# Patient Record
Sex: Female | Born: 1945 | Race: White | Hispanic: No | Smoking: Former smoker
Health system: Southern US, Community
[De-identification: ages and names within clinical notes are randomized; demographics above are authoritative.]

## PROBLEM LIST (undated history)

## (undated) DIAGNOSIS — I2510 Atherosclerotic heart disease of native coronary artery without angina pectoris: Secondary | ICD-10-CM

## (undated) DIAGNOSIS — T8859XA Other complications of anesthesia, initial encounter: Secondary | ICD-10-CM

## (undated) DIAGNOSIS — R112 Nausea with vomiting, unspecified: Secondary | ICD-10-CM

## (undated) DIAGNOSIS — I519 Heart disease, unspecified: Secondary | ICD-10-CM

## (undated) DIAGNOSIS — I509 Heart failure, unspecified: Secondary | ICD-10-CM

## (undated) DIAGNOSIS — R011 Cardiac murmur, unspecified: Secondary | ICD-10-CM

## (undated) DIAGNOSIS — T4145XA Adverse effect of unspecified anesthetic, initial encounter: Secondary | ICD-10-CM

## (undated) DIAGNOSIS — C4492 Squamous cell carcinoma of skin, unspecified: Secondary | ICD-10-CM

## (undated) DIAGNOSIS — K5790 Diverticulosis of intestine, part unspecified, without perforation or abscess without bleeding: Secondary | ICD-10-CM

## (undated) DIAGNOSIS — B019 Varicella without complication: Secondary | ICD-10-CM

## (undated) DIAGNOSIS — M7580 Other shoulder lesions, unspecified shoulder: Secondary | ICD-10-CM

## (undated) DIAGNOSIS — E785 Hyperlipidemia, unspecified: Secondary | ICD-10-CM

## (undated) DIAGNOSIS — I10 Essential (primary) hypertension: Secondary | ICD-10-CM

## (undated) DIAGNOSIS — K635 Polyp of colon: Secondary | ICD-10-CM

## (undated) DIAGNOSIS — D649 Anemia, unspecified: Secondary | ICD-10-CM

## (undated) DIAGNOSIS — I739 Peripheral vascular disease, unspecified: Secondary | ICD-10-CM

## (undated) DIAGNOSIS — E119 Type 2 diabetes mellitus without complications: Secondary | ICD-10-CM

## (undated) DIAGNOSIS — N3090 Cystitis, unspecified without hematuria: Secondary | ICD-10-CM

## (undated) DIAGNOSIS — Z9889 Other specified postprocedural states: Secondary | ICD-10-CM

## (undated) HISTORY — PX: CARDIAC CATHETERIZATION: SHX172

## (undated) HISTORY — DX: Type 2 diabetes mellitus without complications: E11.9

## (undated) HISTORY — DX: Heart disease, unspecified: I51.9

## (undated) HISTORY — PX: COLONOSCOPY: SHX174

## (undated) HISTORY — PX: TUBAL LIGATION: SHX77

## (undated) HISTORY — DX: Cystitis, unspecified without hematuria: N30.90

## (undated) HISTORY — DX: Essential (primary) hypertension: I10

---

## 1984-10-15 DIAGNOSIS — N3090 Cystitis, unspecified without hematuria: Secondary | ICD-10-CM

## 1984-10-15 HISTORY — DX: Cystitis, unspecified without hematuria: N30.90

## 1985-10-15 HISTORY — PX: CHOLECYSTECTOMY: SHX55

## 1987-10-16 HISTORY — PX: ABDOMINAL HYSTERECTOMY: SHX81

## 1999-10-16 DIAGNOSIS — E119 Type 2 diabetes mellitus without complications: Secondary | ICD-10-CM

## 1999-10-16 HISTORY — DX: Type 2 diabetes mellitus without complications: E11.9

## 2001-10-15 DIAGNOSIS — I10 Essential (primary) hypertension: Secondary | ICD-10-CM

## 2001-10-15 HISTORY — DX: Essential (primary) hypertension: I10

## 2005-04-10 ENCOUNTER — Ambulatory Visit: Payer: Self-pay | Admitting: Chiropractic Medicine

## 2005-04-18 ENCOUNTER — Ambulatory Visit: Payer: Self-pay | Admitting: Chiropractic Medicine

## 2006-10-15 DIAGNOSIS — I519 Heart disease, unspecified: Secondary | ICD-10-CM

## 2006-10-15 HISTORY — DX: Heart disease, unspecified: I51.9

## 2006-10-15 HISTORY — PX: CORONARY ANGIOPLASTY: SHX604

## 2007-08-13 ENCOUNTER — Inpatient Hospital Stay: Payer: Self-pay | Admitting: Cardiovascular Disease

## 2007-08-14 ENCOUNTER — Other Ambulatory Visit: Payer: Self-pay

## 2007-10-12 ENCOUNTER — Ambulatory Visit: Payer: Self-pay | Admitting: Emergency Medicine

## 2008-06-30 ENCOUNTER — Ambulatory Visit: Payer: Self-pay | Admitting: Internal Medicine

## 2008-07-08 ENCOUNTER — Ambulatory Visit: Payer: Self-pay | Admitting: Internal Medicine

## 2008-07-27 ENCOUNTER — Ambulatory Visit: Payer: Self-pay | Admitting: Vascular Surgery

## 2008-12-04 ENCOUNTER — Ambulatory Visit: Payer: Self-pay | Admitting: Family Medicine

## 2009-07-20 ENCOUNTER — Ambulatory Visit: Payer: Self-pay | Admitting: Gastroenterology

## 2009-11-07 ENCOUNTER — Ambulatory Visit: Payer: Self-pay | Admitting: Internal Medicine

## 2011-08-08 ENCOUNTER — Emergency Department: Payer: Self-pay | Admitting: Unknown Physician Specialty

## 2011-11-22 ENCOUNTER — Ambulatory Visit: Payer: Self-pay

## 2012-10-05 ENCOUNTER — Ambulatory Visit: Payer: Self-pay | Admitting: Emergency Medicine

## 2012-10-05 LAB — RAPID INFLUENZA A&B ANTIGENS

## 2012-10-12 ENCOUNTER — Ambulatory Visit: Payer: Self-pay

## 2012-10-15 HISTORY — PX: BREAST BIOPSY: SHX20

## 2012-10-27 DIAGNOSIS — D242 Benign neoplasm of left breast: Secondary | ICD-10-CM | POA: Insufficient documentation

## 2013-04-04 ENCOUNTER — Ambulatory Visit: Payer: Self-pay | Admitting: Family Medicine

## 2013-04-24 ENCOUNTER — Encounter: Payer: Self-pay | Admitting: General Surgery

## 2013-05-13 ENCOUNTER — Ambulatory Visit: Payer: Self-pay | Admitting: General Surgery

## 2013-05-18 ENCOUNTER — Ambulatory Visit (INDEPENDENT_AMBULATORY_CARE_PROVIDER_SITE_OTHER): Payer: BC Managed Care – PPO | Admitting: General Surgery

## 2013-05-18 ENCOUNTER — Encounter: Payer: Self-pay | Admitting: General Surgery

## 2013-05-18 VITALS — BP 130/80 | HR 68 | Resp 12 | Ht 60.0 in | Wt 185.0 lb

## 2013-05-18 DIAGNOSIS — D242 Benign neoplasm of left breast: Secondary | ICD-10-CM

## 2013-05-18 DIAGNOSIS — D249 Benign neoplasm of unspecified breast: Secondary | ICD-10-CM

## 2013-05-18 NOTE — Progress Notes (Signed)
Patient ID: Christina Blackburn, female   DOB: 01-Apr-1946, 67 y.o.   MRN: [id number]  Chief Complaint  Patient presents with  . Follow-up    6 month left diagnostic mammogram    HPI Christina Blackburn is a 67 y.o. female who presents for a breast evaluation. The most recent mammogram was done on .04/23/13 cat 2 at [facility]. Patient does perform regular self breast checks and gets regular mammograms done.  No new problems.   The patient suffered a wound to the lateral aspect of the right calf about 3 months ago when her new puppy became too frisky.    HPI  Past Medical History  Diagnosis Date  . Diabetes mellitus without complication 2001  . Heart disease 2008  . Hypertension 2003  . Cystitis 1986    Past Surgical History  Procedure Laterality Date  . Carotid stent  2008  . Abdominal hysterectomy  1989  . Cholecystectomy  1987  . Cesarean section  1971, 1975  . Cardiac catheterization      Family History  Problem Relation Age of Onset  . Colon cancer Cousin 45  . Lymphoma Brother 106    Social History History  Substance Use Topics  . Smoking status: Former Smoker -- 6 years    Types: Cigarettes  . Smokeless tobacco: Never Used  . Alcohol Use: Yes    Allergies  Allergen Reactions  . Tape Other (See Comments)    Hypoallergenic tape - blisters    Current Outpatient Prescriptions  Medication Sig Dispense Refill  . aspirin 81 MG tablet Take 81 mg by mouth daily.      . B Complex Vitamins (B COMPLEX 100 PO) Take 1 capsule by mouth daily.      . calcium carbonate (OS-CAL) 600 MG TABS tablet Take 600 mg by mouth 2 (two) times daily with a meal.      . clopidogrel (PLAVIX) 75 MG tablet Take 75 mg by mouth daily.      . cyclobenzaprine (FLEXERIL) 10 MG tablet Take 10 mg by mouth 3 (three) times daily as needed for muscle spasms.      . fenofibrate (TRICOR) 145 MG tablet Take 145 mg by mouth daily.      . fish oil-omega-3 fatty acids 1000 MG capsule Take 1 g by mouth daily.       Marland Kitchen glimepiride (AMARYL) 4 MG tablet Take 4 mg by mouth 2 (two) times daily.      Marland Kitchen losartan (COZAAR) 100 MG tablet Take 100 mg by mouth daily.      . magnesium oxide (MAG-OX) 400 MG tablet Take 400 mg by mouth daily.      . metFORMIN (GLUCOPHAGE) 1000 MG tablet Take 1,000 mg by mouth 2 (two) times daily with a meal.      . metoprolol succinate (TOPROL-XL) 100 MG 24 hr tablet Take 100 mg by mouth daily. Take with or immediately following a meal.      . rosuvastatin (CRESTOR) 10 MG tablet Take 10 mg by mouth daily.      . sertraline (ZOLOFT) 50 MG tablet Take 50 mg by mouth daily.      Marland Kitchen zinc gluconate 50 MG tablet Take 50 mg by mouth daily.       No current facility-administered medications for this visit.    Review of Systems Review of Systems  Constitutional: Negative.   Respiratory: Negative.   Cardiovascular: Negative.     Blood pressure 130/80, pulse 68, resp. rate  12, height 5' (1.524 m), weight 185 lb (83.915 kg).  Physical Exam Physical Exam  Constitutional: She is oriented to person, place, and time. She appears well-developed and well-nourished.  Neck: Trachea normal. No thyromegaly present.  Cardiovascular: Normal rate and regular rhythm.   Murmur heard. Unchanged heart murmur  Pulmonary/Chest: Effort normal and breath sounds normal. Right breast exhibits no inverted nipple, no mass, no nipple discharge, no skin change and no tenderness. Left breast exhibits no inverted nipple, no mass, no nipple discharge, no skin change and no tenderness.  Neurological: She is alert and oriented to person, place, and time.  Skin: Skin is warm and dry.     Two healing wound on the lateral aspect of the right calf. The superior area measured less than 3 cm in diameter, as adherent scab and no evidence of drainage or inflammation. A smaller areas less than a centimeter in diameter with similar visible findings.    Data Reviewed Left breast mammogram dated 04/23/2013 shows a biopsy  clip at the site of the previously identified mammographic abnormality. The adjacent mass were reported small amount past exams. BI-RAD-2.  Assessment    Intraductal papilloma of the breast, status post biopsy.    Plan    The patient is doing well. She should resume annual screening mammography under the care of her primary care physician. These films would be due in January 2015.       Christina Blackburn 05/18/2013, 8:13 PM

## 2013-05-18 NOTE — Patient Instructions (Addendum)
Patient to return as needed. 

## 2013-07-03 ENCOUNTER — Encounter: Payer: Self-pay | Admitting: Surgery

## 2013-07-15 ENCOUNTER — Encounter: Payer: Self-pay | Admitting: Surgery

## 2013-07-21 LAB — WOUND AEROBIC CULTURE

## 2013-07-31 ENCOUNTER — Encounter: Payer: Self-pay | Admitting: Surgery

## 2013-09-18 ENCOUNTER — Encounter: Payer: Self-pay | Admitting: Surgery

## 2014-03-25 DIAGNOSIS — Z90710 Acquired absence of both cervix and uterus: Secondary | ICD-10-CM | POA: Insufficient documentation

## 2014-03-25 DIAGNOSIS — E118 Type 2 diabetes mellitus with unspecified complications: Secondary | ICD-10-CM | POA: Insufficient documentation

## 2014-03-25 DIAGNOSIS — I2510 Atherosclerotic heart disease of native coronary artery without angina pectoris: Secondary | ICD-10-CM | POA: Insufficient documentation

## 2014-03-25 DIAGNOSIS — Z8679 Personal history of other diseases of the circulatory system: Secondary | ICD-10-CM | POA: Insufficient documentation

## 2014-08-16 ENCOUNTER — Encounter: Payer: Self-pay | Admitting: General Surgery

## 2014-10-15 DIAGNOSIS — C4492 Squamous cell carcinoma of skin, unspecified: Secondary | ICD-10-CM

## 2014-10-15 HISTORY — DX: Squamous cell carcinoma of skin, unspecified: C44.92

## 2014-11-09 ENCOUNTER — Ambulatory Visit: Payer: Self-pay | Admitting: Gastroenterology

## 2014-11-25 ENCOUNTER — Ambulatory Visit: Payer: Self-pay | Admitting: Internal Medicine

## 2015-02-06 ENCOUNTER — Ambulatory Visit
Admit: 2015-02-06 | Disposition: A | Discharge status: Home / Self Care | Payer: Self-pay | Attending: Family Medicine | Admitting: Family Medicine

## 2015-02-07 LAB — SURGICAL PATHOLOGY

## 2015-05-16 DIAGNOSIS — Z9889 Other specified postprocedural states: Secondary | ICD-10-CM | POA: Insufficient documentation

## 2015-09-27 ENCOUNTER — Other Ambulatory Visit: Payer: Self-pay | Admitting: Internal Medicine

## 2015-09-27 DIAGNOSIS — Z1239 Encounter for other screening for malignant neoplasm of breast: Secondary | ICD-10-CM

## 2015-11-01 DIAGNOSIS — E119 Type 2 diabetes mellitus without complications: Secondary | ICD-10-CM | POA: Diagnosis not present

## 2015-11-01 DIAGNOSIS — I10 Essential (primary) hypertension: Secondary | ICD-10-CM | POA: Diagnosis not present

## 2015-11-01 DIAGNOSIS — Z79899 Other long term (current) drug therapy: Secondary | ICD-10-CM | POA: Diagnosis not present

## 2015-11-01 DIAGNOSIS — E7800 Pure hypercholesterolemia, unspecified: Secondary | ICD-10-CM | POA: Diagnosis not present

## 2015-11-28 ENCOUNTER — Ambulatory Visit: Admission: RE | Admit: 2015-11-28 | Payer: Self-pay | Source: Ambulatory Visit

## 2015-12-06 ENCOUNTER — Ambulatory Visit
Admission: RE | Admit: 2015-12-06 | Discharge: 2015-12-06 | Disposition: A | Discharge status: Home / Self Care | Payer: PPO | Source: Ambulatory Visit | Attending: Internal Medicine | Admitting: Internal Medicine

## 2015-12-06 DIAGNOSIS — Z1231 Encounter for screening mammogram for malignant neoplasm of breast: Secondary | ICD-10-CM | POA: Diagnosis not present

## 2015-12-06 DIAGNOSIS — Z1239 Encounter for other screening for malignant neoplasm of breast: Secondary | ICD-10-CM

## 2015-12-14 DIAGNOSIS — Z8679 Personal history of other diseases of the circulatory system: Secondary | ICD-10-CM | POA: Diagnosis not present

## 2015-12-14 DIAGNOSIS — Z9889 Other specified postprocedural states: Secondary | ICD-10-CM | POA: Diagnosis not present

## 2015-12-14 DIAGNOSIS — I10 Essential (primary) hypertension: Secondary | ICD-10-CM | POA: Diagnosis not present

## 2015-12-14 DIAGNOSIS — I2510 Atherosclerotic heart disease of native coronary artery without angina pectoris: Secondary | ICD-10-CM | POA: Diagnosis not present

## 2015-12-14 DIAGNOSIS — E7800 Pure hypercholesterolemia, unspecified: Secondary | ICD-10-CM | POA: Diagnosis not present

## 2016-02-09 ENCOUNTER — Encounter: Payer: Self-pay | Admitting: *Deleted

## 2016-02-09 ENCOUNTER — Ambulatory Visit
Admission: EM | Admit: 2016-02-09 | Discharge: 2016-02-09 | Disposition: A | Discharge status: Home / Self Care | Payer: PPO | Attending: Family Medicine | Admitting: Family Medicine

## 2016-02-09 ENCOUNTER — Ambulatory Visit (INDEPENDENT_AMBULATORY_CARE_PROVIDER_SITE_OTHER): Payer: PPO

## 2016-02-09 DIAGNOSIS — J020 Streptococcal pharyngitis: Secondary | ICD-10-CM

## 2016-02-09 DIAGNOSIS — J209 Acute bronchitis, unspecified: Secondary | ICD-10-CM

## 2016-02-09 DIAGNOSIS — R05 Cough: Secondary | ICD-10-CM | POA: Diagnosis not present

## 2016-02-09 LAB — RAPID INFLUENZA A&B ANTIGENS
Influenza A (ARMC): NEGATIVE
Influenza B (ARMC): NEGATIVE

## 2016-02-09 LAB — RAPID STREP SCREEN (MED CTR MEBANE ONLY): STREPTOCOCCUS, GROUP A SCREEN (DIRECT): POSITIVE — AB

## 2016-02-09 MED ORDER — PREDNISONE 10 MG (21) PO TBPK: ORAL_TABLET | ORAL | Status: DC

## 2016-02-09 MED ORDER — ALBUTEROL SULFATE HFA 108 (90 BASE) MCG/ACT IN AERS: 2.0000 | INHALATION_SPRAY | RESPIRATORY_TRACT | Status: DC | PRN

## 2016-02-09 MED ORDER — IPRATROPIUM-ALBUTEROL 0.5-2.5 (3) MG/3ML IN SOLN
3.0000 mL | Freq: Once | RESPIRATORY_TRACT | Status: AC
  Administered 2016-02-09: 3 mL via RESPIRATORY_TRACT

## 2016-02-09 MED ORDER — HYDROCOD POLST-CPM POLST ER 10-8 MG/5ML PO SUER: 5.0000 mL | Freq: Two times a day (BID) | ORAL | Status: DC | PRN

## 2016-02-09 MED ORDER — PENICILLIN G BENZATHINE 1200000 UNIT/2ML IM SUSP
1.2000 10*6.[IU] | Freq: Once | INTRAMUSCULAR | Status: AC
  Administered 2016-02-09: 1.2 10*6.[IU] via INTRAMUSCULAR

## 2016-02-09 NOTE — ED Provider Notes (Addendum)
CSN: [id number]     Arrival date & time 02/09/16  1528 History   First MD Initiated Contact with Patient 02/09/16 1616    Nurses notes were reviewed. Chief Complaint  Patient presents with  . Cough  . Nasal Congestion  . Generalized Body Aches  . Sore Throat    Patient reports having coughFor over a month. She reports tightness and chest today and difficulty breathing. She's also sore throat last for 5 days as well. She does not have a doctor history COPD. She does have diabetes heart disease hypertension and history recurrent urinary tract infections. She has had cardiac catheterization before and she has had coronary artery disease. She did smoke up until about 6 years ago. Family history is positive for diabetes and hypertension and she's not allergic to anything.     (Consider location/radiation/quality/duration/timing/severity/associated sxs/prior Treatment) Patient is a 70 y.o. female presenting with cough and pharyngitis. The history is provided by the patient.  Cough Cough characteristics:  Productive, non-productive, hacking and barking Severity:  Moderate Onset quality:  Sudden Progression:  Worsening Chronicity:  New Smoker: no   Context: upper respiratory infection   Relieved by:  Cough suppressants Worsened by:  Deep breathing Ineffective treatments:  Beta-agonist inhaler Associated symptoms: myalgias   Associated symptoms: no shortness of breath   Sore Throat Pertinent negatives include no shortness of breath.    Past Medical History  Diagnosis Date  . Diabetes mellitus without complication ([place]) 99991111  . Heart disease 2008  . Hypertension 2003  . Cystitis 1986   Past Surgical History  Procedure Laterality Date  . Carotid stent  2008  . Abdominal hysterectomy  1989  . Cholecystectomy  1987  . Cesarean section  1971, 1975  . Cardiac catheterization    . Breast biopsy Left 2014   Family History  Problem Relation Age of Onset  . Colon cancer Cousin 68   . Lymphoma Brother 26   Social History  Substance Use Topics  . Smoking status: Former Smoker -- 6 years    Types: Cigarettes  . Smokeless tobacco: Never Used  . Alcohol Use: Yes   OB History    Gravida Para Term Preterm AB TAB SAB Ectopic Multiple Living   2 2        2       Obstetric Comments   Menstrual age: 13  Age 1st Pregnancy: 23     Review of Systems  Respiratory: Positive for cough. Negative for shortness of breath.   Musculoskeletal: Positive for myalgias.    Allergies  Tape  Home Medications   Prior to Admission medications   Medication Sig Start Date End Date Taking? Authorizing Provider  aspirin 81 MG tablet Take 81 mg by mouth daily.   Yes Historical Provider, MD  B Complex Vitamins (B COMPLEX 100 PO) Take 1 capsule by mouth daily.   Yes Historical Provider, MD  calcium carbonate (OS-CAL) 600 MG TABS tablet Take 600 mg by mouth 2 (two) times daily with a meal.   Yes Historical Provider, MD  clopidogrel (PLAVIX) 75 MG tablet Take 75 mg by mouth daily.   Yes Historical Provider, MD  fenofibrate (TRICOR) 145 MG tablet Take 145 mg by mouth daily.   Yes Historical Provider, MD  glimepiride (AMARYL) 4 MG tablet Take 4 mg by mouth 2 (two) times daily.   Yes Historical Provider, MD  losartan (COZAAR) 100 MG tablet Take 100 mg by mouth daily.   Yes Historical Provider, MD  magnesium  oxide (MAG-OX) 400 MG tablet Take 400 mg by mouth daily.   Yes Historical Provider, MD  metFORMIN (GLUCOPHAGE) 1000 MG tablet Take 1,000 mg by mouth 2 (two) times daily with a meal.   Yes Historical Provider, MD  metoprolol succinate (TOPROL-XL) 100 MG 24 hr tablet Take 100 mg by mouth daily. Take with or immediately following a meal.   Yes Historical Provider, MD  rosuvastatin (CRESTOR) 10 MG tablet Take 10 mg by mouth daily.   Yes Historical Provider, MD  albuterol (PROVENTIL HFA;VENTOLIN HFA) 108 (90 Base) MCG/ACT inhaler Inhale 2 puffs into the lungs every 4 (four) hours as needed for  wheezing or shortness of breath. 02/09/16   Frederich Cha, MD  chlorpheniramine-HYDROcodone [facility] PENNKINETIC ER) 10-8 MG/5ML SUER Take 5 mLs by mouth every 12 (twelve) hours as needed for cough. 02/09/16   Frederich Cha, MD  cyclobenzaprine (FLEXERIL) 10 MG tablet Take 10 mg by mouth 3 (three) times daily as needed for muscle spasms.    Historical Provider, MD  fish oil-omega-3 fatty acids 1000 MG capsule Take 1 g by mouth daily.    Historical Provider, MD  predniSONE (STERAPRED UNI-PAK 21 TAB) 10 MG (21) TBPK tablet Sig 6 tablet day 1, 5 tablets day 2, 4 tablets day 3,,3tablets day 4, 2 tablets day 5, 1 tablet day 6 take all tablets orally 02/09/16   Frederich Cha, MD  sertraline (ZOLOFT) 50 MG tablet Take 50 mg by mouth daily.    Historical Provider, MD  zinc gluconate 50 MG tablet Take 50 mg by mouth daily.    Historical Provider, MD   Meds Ordered and Administered this Visit   Medications  penicillin g benzathine (BICILLIN LA) 1200000 UNIT/2ML injection 1.2 Million Units (not administered)  ipratropium-albuterol (DUONEB) 0.5-2.5 (3) MG/3ML nebulizer solution 3 mL (3 mLs Nebulization Given 02/09/16 1652)    BP 163/73 mmHg  Pulse 86  Temp(Src) 98.9 F (37.2 C) (Oral)  Resp 16  Ht 5' (1.524 m)  Wt 195 lb (88.451 kg)  BMI 38.08 kg/m2 No data found.   Physical Exam  Constitutional: She is oriented to person, place, and time. She appears well-developed and well-nourished.  HENT:  Head: Normocephalic.  Eyes: Pupils are equal, round, and reactive to light.  Neck: Normal range of motion. Neck supple. No tracheal deviation present.  Cardiovascular: Normal rate and regular rhythm.   Pulmonary/Chest: Effort normal. She has no wheezes.  Musculoskeletal: Normal range of motion.  Neurological: She is oriented to person, place, and time. No cranial nerve deficit.  Skin: Skin is warm. No rash noted.  Psychiatric: She has a normal mood and affect. Her behavior is normal.  Vitals reviewed.   ED  Course  Procedures (including critical care time)  Labs Review Labs Reviewed  RAPID STREP SCREEN (NOT AT [facility]) - Abnormal; Notable for the following:    Streptococcus, Group A Screen (Direct) POSITIVE (*)    All other components within normal limits  RAPID INFLUENZA A&B ANTIGENS ([facility] ONLY)    Imaging Review Dg Chest 2 View  02/09/2016  CLINICAL DATA:  Non-productive cough, runny nose, sore throat, body aches, x1 month. Pt states chest feels tight and has SOB. Former smoker. No hx of cancer/surg. EXAM: CHEST  2 VIEW COMPARISON:  08/08/2011 FINDINGS: Cardiac silhouette is normal in size and configuration. Aorta is mildly tortuous. No mediastinal or hilar masses or evidence of adenopathy. Clear lungs.  No pleural effusion or pneumothorax. Bony thorax is demineralized but grossly intact. IMPRESSION: No  active cardiopulmonary disease. Electronically Signed   By: Lajean Manes M.D.   On: 02/09/2016 16:56     Visual Acuity Review  Right Eye Distance:   Left Eye Distance:   Bilateral Distance:    Right Eye Near:   Left Eye Near:    Bilateral Near:     Results for orders placed or performed during the hospital encounter of 02/09/16  Rapid Influenza A&B Antigens ([facility] only)  Result Value Ref Range   Influenza A ([facility]) NEGATIVE NEGATIVE   Influenza B ([facility]) NEGATIVE NEGATIVE  Rapid strep screen  Result Value Ref Range   Streptococcus, Group A Screen (Direct) POSITIVE (A) NEGATIVE      MDM   1. Strep throat   2. Bronchitis, acute, with bronchospasm    Patient will be given LA Bicillin for the strep throat. For the cough going on for a month Tussionex 1 teaspoon twice a day prednisone for 6 days and albuterol inhaler as needed. Follow-up with Dr. Doy Hutching in in 2 weeks if needed if cough remains or persist.  Strep test positive chest x-ray was negative.  Should be noted the breathing treatment the DuoNeb did make a difference in her breathing.  Note: This dictation was prepared  with Dragon dictation along with smaller phrase technology. Any transcriptional errors that result from this process are unintentional.    Frederich Cha, MD 02/09/16 [place], MD 02/09/16 1753

## 2016-02-09 NOTE — Discharge Instructions (Signed)
Acute Bronchitis Bronchitis is when the airways that extend from the windpipe into the lungs get red, puffy, and painful (inflamed). Bronchitis often causes thick spit (mucus) to develop. This leads to a cough. A cough is the most common symptom of bronchitis. In acute bronchitis, the condition usually begins suddenly and goes away over time (usually in 2 weeks). Smoking, allergies, and asthma can make bronchitis worse. Repeated episodes of bronchitis may cause more lung problems. HOME CARE  Rest.  Drink enough fluids to keep your pee (urine) clear or pale yellow (unless you need to limit fluids as told by your doctor).  Only take over-the-counter or prescription medicines as told by your doctor.  Avoid smoking and secondhand smoke. These can make bronchitis worse. If you are a smoker, think about using nicotine gum or skin patches. Quitting smoking will help your lungs heal faster.  Reduce the chance of getting bronchitis again by:  Washing your hands often.  Avoiding people with cold symptoms.  Trying not to touch your hands to your mouth, nose, or eyes.  Follow up with your doctor as told. GET HELP IF: Your symptoms do not improve after 1 week of treatment. Symptoms include:  Cough.  Fever.  Coughing up thick spit.  Body aches.  Chest congestion.  Chills.  Shortness of breath.  Sore throat. GET HELP RIGHT AWAY IF:   You have an increased fever.  You have chills.  You have severe shortness of breath.  You have bloody thick spit (sputum).  You throw up (vomit) often.  You lose too much body fluid (dehydration).  You have a severe headache.  You faint. MAKE SURE YOU:   Understand these instructions.  Will watch your condition.  Will get help right away if you are not doing well or get worse.   This information is not intended to replace advice given to you by your health care provider. Make sure you discuss any questions you have with your health care  provider.   Document Released: 03/19/2008 Document Revised: 06/03/2013 Document Reviewed: 03/24/2013 Elsevier Interactive Patient Education 2016 Christina Blackburn.  Strep Throat Strep throat is an infection of the throat. It is caused by germs. Strep throat spreads from person to person because of coughing, sneezing, or close contact. HOME CARE Medicines  Take over-the-counter and prescription medicines only as told by your doctor.  Take your antibiotic medicine as told by your doctor. Do not stop taking the medicine even if you feel better.  Have family members who also have a sore throat or fever go to a doctor. Eating and Drinking  Do not share food, drinking cups, or personal items.  Try eating soft foods until your sore throat feels better.  Drink enough fluid to keep your pee (urine) clear or pale yellow. General Instructions  Rinse your mouth (gargle) with a salt-water mixture 3-4 times per day or as needed. To make a salt-water mixture, stir -1 tsp of salt into 1 cup of warm water.  Make sure that all people in your house wash their hands well.  Rest.  Stay home from school or work until you have been taking antibiotics for 24 hours.  Keep all follow-up visits as told by your doctor. This is important. GET HELP IF:  Your neck keeps getting bigger.  You get a rash, cough, or earache.  You cough up thick liquid that is green, yellow-brown, or bloody.  You have pain that does not get better with medicine.  Your problems  get worse instead of getting better.  You have a fever. GET HELP RIGHT AWAY IF:  You throw up (vomit).  You get a very bad headache.  You neck hurts or it feels stiff.  You have chest pain or you are short of breath.  You have drooling, very bad throat pain, or changes in your voice.  Your neck is swollen or the skin gets red and tender.  Your mouth is dry or you are peeing less than normal.  You keep feeling more tired or it is hard  to wake up.  Your joints are red or they hurt.   This information is not intended to replace advice given to you by your health care provider. Make sure you discuss any questions you have with your health care provider.   Document Released: 03/19/2008 Document Revised: 06/22/2015 Document Reviewed: 01/24/2015 Elsevier Interactive Patient Education 2016 Else[employer] Inc[employer]  Upper Respiratory Infection, Adult Most upper respiratory infections (URIs) are caused by a virus. A URI affects the nose, throat, and upper air passages. The most common type of URI is often called "the common cold." HOME CARE   Take medicines only as told by your doctor.  Gargle warm saltwater or take cough drops to comfort your throat as told by your doctor.  Use a warm mist humidifier or inhale steam from a shower to increase air moisture. This may make it easier to breathe.  Drink enough fluid to keep your pee (urine) clear or pale yellow.  Eat soups and other clear broths.  Have a healthy diet.  Rest as needed.  Go back to work when your fever is gone or your doctor says it is okay.  You may need to stay home longer to avoid giving your URI to others.  You can also wear a face mask and wash your hands often to prevent spread of the virus.  Use your inhaler more if you have asthma.  Do not use any tobacco products, including cigarettes, chewing tobacco, or electronic cigarettes. If you need help quitting, ask your doctor. GET HELP IF:  You are getting worse, not better.  Your symptoms are not helped by medicine.  You have chills.  You are getting more short of breath.  You have brown or red mucus.  You have yellow or brown discharge from your nose.  You have pain in your face, especially when you bend forward.  You have a fever.  You have puffy (swollen) neck glands.  You have pain while swallowing.  You have white areas in the back of your throat. GET HELP RIGHT AWAY IF:   You have very  bad or constant:  Headache.  Ear pain.  Pain in your forehead, behind your eyes, and over your cheekbones (sinus pain).  Chest pain.  You have long-lasting (chronic) lung disease and any of the following:  Wheezing.  Long-lasting cough.  Coughing up blood.  A change in your usual mucus.  You have a stiff neck.  You have changes in your:  Vision.  Hearing.  Thinking.  Mood. MAKE SURE YOU:   Understand these instructions.  Will watch your condition.  Will get help right away if you are not doing well or get worse.   This information is not intended to replace advice given to you by your health care provider. Make sure you discuss any questions you have with your health care provider.   Document Released: 03/19/2008 Document Revised: 02/15/2015 Document Reviewed: 01/06/2014 Elsevier Interactive Patient  Education ©2016 Elsevier Inc. ° °

## 2016-02-09 NOTE — ED Notes (Signed)
Non-productive cough, runny nose, sore throat, body aches, x1 month.

## 2016-02-22 DIAGNOSIS — J020 Streptococcal pharyngitis: Secondary | ICD-10-CM | POA: Diagnosis not present

## 2016-03-27 ENCOUNTER — Other Ambulatory Visit: Payer: Self-pay | Admitting: Internal Medicine

## 2016-03-27 DIAGNOSIS — E119 Type 2 diabetes mellitus without complications: Secondary | ICD-10-CM | POA: Diagnosis not present

## 2016-03-27 DIAGNOSIS — I10 Essential (primary) hypertension: Secondary | ICD-10-CM | POA: Diagnosis not present

## 2016-03-27 DIAGNOSIS — I2510 Atherosclerotic heart disease of native coronary artery without angina pectoris: Secondary | ICD-10-CM | POA: Diagnosis not present

## 2016-03-27 DIAGNOSIS — R1084 Generalized abdominal pain: Secondary | ICD-10-CM

## 2016-03-27 DIAGNOSIS — E7800 Pure hypercholesterolemia, unspecified: Secondary | ICD-10-CM | POA: Diagnosis not present

## 2016-03-27 DIAGNOSIS — Z79899 Other long term (current) drug therapy: Secondary | ICD-10-CM | POA: Diagnosis not present

## 2016-03-27 DIAGNOSIS — I739 Peripheral vascular disease, unspecified: Secondary | ICD-10-CM | POA: Diagnosis not present

## 2016-04-02 ENCOUNTER — Ambulatory Visit: Payer: PPO

## 2016-04-02 DIAGNOSIS — I739 Peripheral vascular disease, unspecified: Secondary | ICD-10-CM | POA: Diagnosis not present

## 2016-04-02 DIAGNOSIS — E785 Hyperlipidemia, unspecified: Secondary | ICD-10-CM | POA: Diagnosis not present

## 2016-04-02 DIAGNOSIS — E119 Type 2 diabetes mellitus without complications: Secondary | ICD-10-CM | POA: Diagnosis not present

## 2016-04-02 DIAGNOSIS — I70213 Atherosclerosis of native arteries of extremities with intermittent claudication, bilateral legs: Secondary | ICD-10-CM | POA: Diagnosis not present

## 2016-04-04 DIAGNOSIS — R1084 Generalized abdominal pain: Secondary | ICD-10-CM | POA: Diagnosis not present

## 2016-04-06 ENCOUNTER — Ambulatory Visit
Admission: RE | Admit: 2016-04-06 | Discharge: 2016-04-06 | Disposition: A | Discharge status: Home / Self Care | Payer: PPO | Source: Ambulatory Visit | Attending: Internal Medicine | Admitting: Internal Medicine

## 2016-04-06 DIAGNOSIS — I2510 Atherosclerotic heart disease of native coronary artery without angina pectoris: Secondary | ICD-10-CM | POA: Insufficient documentation

## 2016-04-06 DIAGNOSIS — K5730 Diverticulosis of large intestine without perforation or abscess without bleeding: Secondary | ICD-10-CM | POA: Diagnosis not present

## 2016-04-06 DIAGNOSIS — K760 Fatty (change of) liver, not elsewhere classified: Secondary | ICD-10-CM | POA: Insufficient documentation

## 2016-04-06 DIAGNOSIS — R1084 Generalized abdominal pain: Secondary | ICD-10-CM | POA: Insufficient documentation

## 2016-04-06 DIAGNOSIS — I700 Atherosclerosis of aorta: Secondary | ICD-10-CM | POA: Insufficient documentation

## 2016-04-06 HISTORY — DX: Squamous cell carcinoma of skin, unspecified: C44.92

## 2016-04-06 MED ORDER — IOPAMIDOL (ISOVUE-300) INJECTION 61%
100.0000 mL | Freq: Once | INTRAVENOUS | Status: AC | PRN
  Administered 2016-04-06: 100 mL via INTRAVENOUS

## 2016-04-12 DIAGNOSIS — E113293 Type 2 diabetes mellitus with mild nonproliferative diabetic retinopathy without macular edema, bilateral: Secondary | ICD-10-CM | POA: Diagnosis not present

## 2016-05-10 DIAGNOSIS — I700 Atherosclerosis of aorta: Secondary | ICD-10-CM | POA: Diagnosis not present

## 2016-05-10 DIAGNOSIS — I70213 Atherosclerosis of native arteries of extremities with intermittent claudication, bilateral legs: Secondary | ICD-10-CM | POA: Diagnosis not present

## 2016-05-10 DIAGNOSIS — I739 Peripheral vascular disease, unspecified: Secondary | ICD-10-CM | POA: Diagnosis not present

## 2016-05-10 DIAGNOSIS — M79609 Pain in unspecified limb: Secondary | ICD-10-CM | POA: Diagnosis not present

## 2016-05-10 DIAGNOSIS — E119 Type 2 diabetes mellitus without complications: Secondary | ICD-10-CM | POA: Diagnosis not present

## 2016-05-10 DIAGNOSIS — E785 Hyperlipidemia, unspecified: Secondary | ICD-10-CM | POA: Diagnosis not present

## 2016-06-21 DIAGNOSIS — E7800 Pure hypercholesterolemia, unspecified: Secondary | ICD-10-CM | POA: Diagnosis not present

## 2016-06-21 DIAGNOSIS — Z9889 Other specified postprocedural states: Secondary | ICD-10-CM | POA: Diagnosis not present

## 2016-06-21 DIAGNOSIS — Z8679 Personal history of other diseases of the circulatory system: Secondary | ICD-10-CM | POA: Diagnosis not present

## 2016-06-21 DIAGNOSIS — I10 Essential (primary) hypertension: Secondary | ICD-10-CM | POA: Diagnosis not present

## 2016-08-21 DIAGNOSIS — K029 Dental caries, unspecified: Secondary | ICD-10-CM | POA: Diagnosis not present

## 2016-08-21 DIAGNOSIS — K047 Periapical abscess without sinus: Secondary | ICD-10-CM | POA: Diagnosis not present

## 2016-09-20 DIAGNOSIS — Z79899 Other long term (current) drug therapy: Secondary | ICD-10-CM | POA: Diagnosis not present

## 2016-09-20 DIAGNOSIS — E119 Type 2 diabetes mellitus without complications: Secondary | ICD-10-CM | POA: Diagnosis not present

## 2016-09-20 DIAGNOSIS — I10 Essential (primary) hypertension: Secondary | ICD-10-CM | POA: Diagnosis not present

## 2016-09-20 DIAGNOSIS — E7800 Pure hypercholesterolemia, unspecified: Secondary | ICD-10-CM | POA: Diagnosis not present

## 2016-09-27 DIAGNOSIS — Z1231 Encounter for screening mammogram for malignant neoplasm of breast: Secondary | ICD-10-CM | POA: Diagnosis not present

## 2016-09-27 DIAGNOSIS — E7800 Pure hypercholesterolemia, unspecified: Secondary | ICD-10-CM | POA: Diagnosis not present

## 2016-09-27 DIAGNOSIS — I2510 Atherosclerotic heart disease of native coronary artery without angina pectoris: Secondary | ICD-10-CM | POA: Diagnosis not present

## 2016-09-27 DIAGNOSIS — I10 Essential (primary) hypertension: Secondary | ICD-10-CM | POA: Diagnosis not present

## 2016-09-27 DIAGNOSIS — Z0000 Encounter for general adult medical examination without abnormal findings: Secondary | ICD-10-CM | POA: Diagnosis not present

## 2016-09-27 DIAGNOSIS — E119 Type 2 diabetes mellitus without complications: Secondary | ICD-10-CM | POA: Diagnosis not present

## 2016-09-27 DIAGNOSIS — Z79899 Other long term (current) drug therapy: Secondary | ICD-10-CM | POA: Diagnosis not present

## 2016-10-11 DIAGNOSIS — H40003 Preglaucoma, unspecified, bilateral: Secondary | ICD-10-CM | POA: Diagnosis not present

## 2016-10-16 DIAGNOSIS — H40003 Preglaucoma, unspecified, bilateral: Secondary | ICD-10-CM | POA: Diagnosis not present

## 2016-10-22 ENCOUNTER — Other Ambulatory Visit: Payer: Self-pay | Admitting: Internal Medicine

## 2016-10-22 DIAGNOSIS — Z1231 Encounter for screening mammogram for malignant neoplasm of breast: Secondary | ICD-10-CM

## 2016-10-22 DIAGNOSIS — L603 Nail dystrophy: Secondary | ICD-10-CM | POA: Diagnosis not present

## 2016-10-22 DIAGNOSIS — L853 Xerosis cutis: Secondary | ICD-10-CM | POA: Diagnosis not present

## 2016-12-06 ENCOUNTER — Ambulatory Visit
Admission: RE | Admit: 2016-12-06 | Discharge: 2016-12-06 | Disposition: A | Discharge status: Home / Self Care | Payer: PPO | Source: Ambulatory Visit | Attending: Internal Medicine | Admitting: Internal Medicine

## 2016-12-06 DIAGNOSIS — Z1231 Encounter for screening mammogram for malignant neoplasm of breast: Secondary | ICD-10-CM | POA: Insufficient documentation

## 2017-01-15 DIAGNOSIS — E119 Type 2 diabetes mellitus without complications: Secondary | ICD-10-CM | POA: Diagnosis not present

## 2017-01-15 DIAGNOSIS — Z8679 Personal history of other diseases of the circulatory system: Secondary | ICD-10-CM | POA: Diagnosis not present

## 2017-01-15 DIAGNOSIS — I10 Essential (primary) hypertension: Secondary | ICD-10-CM | POA: Diagnosis not present

## 2017-01-15 DIAGNOSIS — Z9889 Other specified postprocedural states: Secondary | ICD-10-CM | POA: Diagnosis not present

## 2017-03-21 DIAGNOSIS — I10 Essential (primary) hypertension: Secondary | ICD-10-CM | POA: Diagnosis not present

## 2017-03-21 DIAGNOSIS — Z79899 Other long term (current) drug therapy: Secondary | ICD-10-CM | POA: Diagnosis not present

## 2017-03-21 DIAGNOSIS — E119 Type 2 diabetes mellitus without complications: Secondary | ICD-10-CM | POA: Diagnosis not present

## 2017-03-21 DIAGNOSIS — E7800 Pure hypercholesterolemia, unspecified: Secondary | ICD-10-CM | POA: Diagnosis not present

## 2017-03-28 DIAGNOSIS — M79671 Pain in right foot: Secondary | ICD-10-CM | POA: Diagnosis not present

## 2017-03-28 DIAGNOSIS — I2510 Atherosclerotic heart disease of native coronary artery without angina pectoris: Secondary | ICD-10-CM | POA: Diagnosis not present

## 2017-03-28 DIAGNOSIS — Z0000 Encounter for general adult medical examination without abnormal findings: Secondary | ICD-10-CM | POA: Diagnosis not present

## 2017-03-28 DIAGNOSIS — E119 Type 2 diabetes mellitus without complications: Secondary | ICD-10-CM | POA: Diagnosis not present

## 2017-03-28 DIAGNOSIS — I10 Essential (primary) hypertension: Secondary | ICD-10-CM | POA: Diagnosis not present

## 2017-03-28 DIAGNOSIS — M79672 Pain in left foot: Secondary | ICD-10-CM | POA: Diagnosis not present

## 2017-03-28 DIAGNOSIS — E7800 Pure hypercholesterolemia, unspecified: Secondary | ICD-10-CM | POA: Diagnosis not present

## 2017-03-28 DIAGNOSIS — Z79899 Other long term (current) drug therapy: Secondary | ICD-10-CM | POA: Diagnosis not present

## 2017-04-10 DIAGNOSIS — E1142 Type 2 diabetes mellitus with diabetic polyneuropathy: Secondary | ICD-10-CM | POA: Diagnosis not present

## 2017-04-30 DIAGNOSIS — E113293 Type 2 diabetes mellitus with mild nonproliferative diabetic retinopathy without macular edema, bilateral: Secondary | ICD-10-CM | POA: Diagnosis not present

## 2017-05-14 ENCOUNTER — Other Ambulatory Visit (INDEPENDENT_AMBULATORY_CARE_PROVIDER_SITE_OTHER): Payer: Self-pay | Admitting: Vascular Surgery

## 2017-05-14 DIAGNOSIS — I739 Peripheral vascular disease, unspecified: Secondary | ICD-10-CM

## 2017-05-16 ENCOUNTER — Ambulatory Visit (INDEPENDENT_AMBULATORY_CARE_PROVIDER_SITE_OTHER): Payer: PPO | Admitting: Vascular Surgery

## 2017-05-16 ENCOUNTER — Ambulatory Visit (INDEPENDENT_AMBULATORY_CARE_PROVIDER_SITE_OTHER): Payer: PPO

## 2017-05-16 ENCOUNTER — Encounter (INDEPENDENT_AMBULATORY_CARE_PROVIDER_SITE_OTHER): Payer: Self-pay

## 2017-05-16 ENCOUNTER — Encounter (INDEPENDENT_AMBULATORY_CARE_PROVIDER_SITE_OTHER): Payer: Self-pay | Admitting: Vascular Surgery

## 2017-05-16 DIAGNOSIS — E119 Type 2 diabetes mellitus without complications: Secondary | ICD-10-CM | POA: Insufficient documentation

## 2017-05-16 DIAGNOSIS — I739 Peripheral vascular disease, unspecified: Secondary | ICD-10-CM

## 2017-05-16 DIAGNOSIS — E1151 Type 2 diabetes mellitus with diabetic peripheral angiopathy without gangrene: Secondary | ICD-10-CM

## 2017-05-16 DIAGNOSIS — E785 Hyperlipidemia, unspecified: Secondary | ICD-10-CM | POA: Insufficient documentation

## 2017-05-16 DIAGNOSIS — I10 Essential (primary) hypertension: Secondary | ICD-10-CM

## 2017-05-16 DIAGNOSIS — E782 Mixed hyperlipidemia: Secondary | ICD-10-CM

## 2017-05-16 DIAGNOSIS — E1169 Type 2 diabetes mellitus with other specified complication: Secondary | ICD-10-CM | POA: Insufficient documentation

## 2017-05-16 NOTE — Progress Notes (Signed)
MRN : [id number]  Christina Blackburn is a 71 y.o. (10/10/1946) female who presents with chief complaint of No chief complaint on file. Marland Kitchen  History of Present Illness: The patient returns to the office for followup and review of the noninvasive studies. There have been no interval changes in lower extremity symptoms. No interval shortening of the patient's claudication distance or development of rest pain symptoms. No new ulcers or wounds have occurred since the last visit.  There have been no significant changes to the patient's overall health care.  The patient denies amaurosis fugax or recent TIA symptoms. There are no recent neurological changes noted. The patient denies history of DVT, PE or superficial thrombophlebitis. The patient denies recent episodes of angina or shortness of breath.    No outpatient prescriptions have been marked as taking for the 05/16/17 encounter (Appointment) with Delana Meyer, Dolores Lory, MD.    Past Medical History:  Diagnosis Date  . Cystitis 1986  . Diabetes mellitus without complication ([place]) 5643  . Heart disease 2008  . Hypertension 2003  . Squamous cell skin cancer 2016    Past Surgical History:  Procedure Laterality Date  . ABDOMINAL HYSTERECTOMY  1989  . BREAST BIOPSY Left 2014   neg  . CARDIAC CATHETERIZATION    . CAROTID STENT  2008  . [place]  . CHOLECYSTECTOMY  1987    Social History Social History  Substance Use Topics  . Smoking status: Former Smoker    Years: 6.00    Types: Cigarettes  . Smokeless tobacco: Never Used  . Alcohol use Yes    Family History Family History  Problem Relation Age of Onset  . Colon cancer Cousin 69  . Lymphoma Brother 37  . Breast cancer Neg Hx     Allergies  Allergen Reactions  . Tape Other (See Comments)    Hypoallergenic tape - blisters     REVIEW OF SYSTEMS (Negative unless checked)  Constitutional: [] Weight loss  [] Fever  [] Chills Cardiac: [] Chest pain    [] Chest pressure   [] Palpitations   [] Shortness of breath when laying flat   [] Shortness of breath with exertion. Vascular:  [x] Pain in legs with walking   [] Pain in legs at rest  [] History of DVT   [] Phlebitis   [] Swelling in legs   [] Varicose veins   [] Non-healing ulcers Pulmonary:   [] Uses home oxygen   [] Productive cough   [] Hemoptysis   [] Wheeze  [] COPD   [] Asthma Neurologic:  [] Dizziness   [] Seizures   [] History of stroke   [] History of TIA  [] Aphasia   [] Vissual changes   [] Weakness or numbness in arm   [] Weakness or numbness in leg Musculoskeletal:   [] Joint swelling   [x] Joint pain   [] Low back pain Hematologic:  [] Easy bruising  [] Easy bleeding   [] Hypercoagulable state   [] Anemic Gastrointestinal:  [] Diarrhea   [] Vomiting  [] Gastroesophageal reflux/heartburn   [] Difficulty swallowing. Genitourinary:  [] Chronic kidney disease   [] Difficult urination  [] Frequent urination   [] Blood in urine Skin:  [] Rashes   [] Ulcers  Psychological:  [] History of anxiety   []  History of major depression.  Physical Examination  There were no vitals filed for this visit. There is no height or weight on file to calculate BMI. Gen: WD/WN, NAD Head: [place]/AT, No temporalis wasting.  Ear/Nose/Throat: Hearing grossly intact, nares w/o erythema or drainage Eyes: PER, EOMI, sclera nonicteric.  Neck: Supple, no large masses.   Pulmonary:  Good air movement, no  audible wheezing bilaterally, no use of accessory muscles.  Cardiac: RRR, no JVD Vascular:  Vessel Right Left  Radial Palpable Palpable  Ulnar Palpable Palpable  PT Not Palpable Not Palpable  DP Not Palpable Not Palpable  Gastrointestinal: Non-distended. No guarding/no peritoneal signs.  Musculoskeletal: M/S 5/5 throughout.  No deformity or atrophy.  Neurologic: CN 2-12 intact. Symmetrical.  Speech is fluent. Motor exam as listed above. Psychiatric: Judgment intact, Mood & affect appropriate for pt's clinical situation. Dermatologic: No rashes or  ulcers noted.  No changes consistent with cellulitis. Lymph : No lichenification or skin changes of chronic lymphedema.  CBC No results found for: WBC, HGB, HCT, MCV, PLT  BMET No results found for: NA, K, CL, CO2, GLUCOSE, BUN, CREATININE, CALCIUM, GFRNONAA, GFRAA CrCl cannot be calculated (No order found.).  COAG No results found for: INR, PROTIME  Radiology No results found.   Assessment/Plan 1. PAD (peripheral artery disease) (HCC)  Recommend:  The patient has evidence of atherosclerosis of the lower extremities with claudication.  The patient does not voice lifestyle limiting changes at this point in time.  Noninvasive studies do not suggest clinically significant change.  No invasive studies, angiography or surgery at this time The patient should continue walking and begin a more formal exercise program.  The patient should continue antiplatelet therapy and aggressive treatment of the lipid abnormalities  No changes in the patient's medications at this time  The patient should continue wearing graduated compression socks 10-15 mmHg strength to control the mild edema.    2. Type 2 diabetes mellitus with diabetic peripheral angiopathy without gangrene, without long-term current use of insulin ([place]) Continue hypoglycemic medications as already ordered, these medications have been reviewed and there are no changes at this time.  Hgb A1C to be monitored as already arranged by primary service   3. Essential hypertension Continue antihypertensive medications as already ordered, these medications have been reviewed and there are no changes at this time.   4. Mixed hyperlipidemia Continue statin as ordered and reviewed, no changes at this time     [name], MD  05/16/2017 9:26 AM

## 2018-03-26 DIAGNOSIS — M7581 Other shoulder lesions, right shoulder: Secondary | ICD-10-CM | POA: Diagnosis not present

## 2018-03-26 DIAGNOSIS — M75101 Unspecified rotator cuff tear or rupture of right shoulder, not specified as traumatic: Secondary | ICD-10-CM | POA: Diagnosis not present

## 2018-03-26 DIAGNOSIS — M47812 Spondylosis without myelopathy or radiculopathy, cervical region: Secondary | ICD-10-CM | POA: Insufficient documentation

## 2018-03-26 DIAGNOSIS — M542 Cervicalgia: Secondary | ICD-10-CM | POA: Diagnosis not present

## 2018-03-26 DIAGNOSIS — M12811 Other specific arthropathies, not elsewhere classified, right shoulder: Secondary | ICD-10-CM | POA: Diagnosis not present

## 2018-03-26 DIAGNOSIS — M25511 Pain in right shoulder: Secondary | ICD-10-CM | POA: Diagnosis not present

## 2018-03-26 DIAGNOSIS — M7582 Other shoulder lesions, left shoulder: Secondary | ICD-10-CM | POA: Diagnosis not present

## 2018-03-26 DIAGNOSIS — M25512 Pain in left shoulder: Secondary | ICD-10-CM | POA: Diagnosis not present

## 2018-03-27 ENCOUNTER — Other Ambulatory Visit: Payer: Self-pay | Admitting: Surgery

## 2018-03-27 DIAGNOSIS — M7581 Other shoulder lesions, right shoulder: Secondary | ICD-10-CM

## 2018-03-27 DIAGNOSIS — M75101 Unspecified rotator cuff tear or rupture of right shoulder, not specified as traumatic: Secondary | ICD-10-CM

## 2018-03-27 DIAGNOSIS — M12811 Other specific arthropathies, not elsewhere classified, right shoulder: Secondary | ICD-10-CM

## 2018-03-29 ENCOUNTER — Ambulatory Visit
Admission: EM | Admit: 2018-03-29 | Discharge: 2018-03-29 | Disposition: A | Discharge status: Home / Self Care | Payer: PPO | Attending: Family Medicine | Admitting: Family Medicine

## 2018-03-29 ENCOUNTER — Other Ambulatory Visit: Payer: Self-pay

## 2018-03-29 DIAGNOSIS — M79675 Pain in left toe(s): Secondary | ICD-10-CM | POA: Diagnosis not present

## 2018-03-29 MED ORDER — CEPHALEXIN 500 MG PO CAPS
500.0000 mg | ORAL_CAPSULE | Freq: Four times a day (QID) | ORAL | 0 refills | Status: DC
Start: 2018-03-29 — End: 2018-04-01

## 2018-03-29 NOTE — ED Provider Notes (Signed)
[facility]    CSN: [id number] Arrival date & time: 03/29/18  1359   History   Chief Complaint Chief Complaint  Patient presents with  . Toe Pain    HPI  72 year old female with a history of PAD and type 2 diabetes presents with left fifth toe pain and redness.  Patient reports that the toe has been bothering her for the past 2 weeks.  She is recently developed redness.  No reports of fever.  No open wound.  Patient states that she has upcoming surgery and wanted to make sure she did have an infection.  She states it is very tender to palpation.  She reports redness on the top of her left fifth toe.  No known exacerbating factors.  No other associated symptoms.  No other complaints or concerns at this time.  Of note, she reports no fall, trauma, injury.  Past Medical History:  Diagnosis Date  . Cystitis 1986  . Diabetes mellitus without complication ([place]) 0488  . Heart disease 2008  . Hypertension 2003  . Squamous cell skin cancer 2016    Patient Active Problem List   Diagnosis Date Noted  . PAD (peripheral artery disease) ([place]) 05/16/2017  . Diabetes ([place]) 05/16/2017  . Essential hypertension 05/16/2017  . Hyperlipidemia 05/16/2017  . 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 ABDOMINAL HYSTERECTOMY  1989  . BREAST BIOPSY Left 2014   neg  . CARDIAC CATHETERIZATION    . CAROTID STENT  2008  . [place]  . CHOLECYSTECTOMY  1987    OB History    Gravida  2   Para  2   Term      Preterm      AB      Living  2     SAB      TAB      Ectopic      Multiple      Live Births           Obstetric Comments  Menstrual age: 5  Age 1st Pregnancy: 66         Home Medications    Prior to Admission medications   Medication Sig Start Date End Date Taking? Authorizing Provider  aspirin 81 MG tablet Take 81 mg by mouth daily.    [provider]  atorvastatin  (LIPITOR) 20 MG tablet Take by mouth. 03/28/17   [provider]  B Complex Vitamins (B COMPLEX 100 PO) Take 1 capsule by mouth daily.    [provider]  calcium carbonate (OS-CAL) 600 MG TABS tablet Take 600 mg by mouth 2 (two) times daily with a meal.    [provider]  cephALEXin (KEFLEX) 500 MG capsule Take 1 capsule (500 mg total) by mouth 4 (four) times daily. 03/29/18   Coral Spikes, DO  clopidogrel (PLAVIX) 75 MG tablet Take 75 mg by mouth daily.    [provider]  fenofibrate (TRICOR) 145 MG tablet Take 145 mg by mouth daily.    [provider]  fish oil-omega-3 fatty acids 1000 MG capsule Take 1 g by mouth daily.    [provider]  glimepiride (AMARYL) 4 MG tablet Take 4 mg by mouth 2 (two) times daily.    [provider]  losartan-hydrochlorothiazide Konrad Penta) 100-25 MG tablet  04/30/17   [provider]  magnesium oxide (MAG-OX) 400 MG tablet Take 400 mg  by mouth daily.    [provider]  metFORMIN (GLUCOPHAGE) 1000 MG tablet Take 1,000 mg by mouth 2 (two) times daily with a meal.    [provider]  metoprolol succinate (TOPROL-XL) 100 MG 24 hr tablet Take 100 mg by mouth daily. Take with or immediately following a meal.    [provider]  sertraline (ZOLOFT) 50 MG tablet Take 50 mg by mouth daily.    [provider]  vitamin E 400 UNIT capsule Take by mouth.    [provider]  zinc gluconate 50 MG tablet Take 50 mg by mouth daily.    [provider]    Family History Family History  Problem Relation Age of Onset  . Colon cancer Cousin 22  . Lymphoma Brother 69  . Breast cancer Neg Hx     Social History Social History   Tobacco Use  . Smoking status: Former Smoker    Years: 6.00    Types: Cigarettes  . Smokeless tobacco: Never Used  Substance Use Topics  . Alcohol use: Yes    Comment: rare  . Drug use: No     Allergies    Tape   Review of Systems Review of Systems  Constitutional: Negative.   Skin:       Left 5th toe redness and pain.   Physical Exam Triage Vital Signs ED Triage Vitals  Enc Vitals Group     BP 03/29/18 1413 134/69     Pulse Rate 03/29/18 1413 76     Resp 03/29/18 1413 18     Temp 03/29/18 1413 97.9 F (36.6 C)     Temp Source 03/29/18 1413 Oral     SpO2 03/29/18 1413 98 %     Weight 03/29/18 1414 180 lb (81.6 kg)     Height 03/29/18 1414 4' 11.5" (1.511 m)     Head Circumference --      Peak Flow --      Pain Score 03/29/18 1414 0     Pain Loc --      Pain Edu? --      Excl. in [place]? --    Updated Vital Signs BP 134/69 (BP Location: Left Arm)   Pulse 76   Temp 97.9 F (36.6 C) (Oral)   Resp 18   Ht 4' 11.5" (1.511 m)   Wt 180 lb (81.6 kg)   SpO2 98%   BMI 35.75 kg/m   Visual Acuity Right Eye Distance:   Left Eye Distance:   Bilateral Distance:    Right Eye Near:   Left Eye Near:    Bilateral Near:     Physical Exam  Constitutional: She is oriented to person, place, and time. She appears well-developed. No distress.  HENT:  Head: Normocephalic and atraumatic.  Pulmonary/Chest: Effort normal. No respiratory distress.  Neurological: She is alert and oriented to person, place, and time.  Skin:  Left fifth toe -plantar surface of the toe appears to have a plantar wart.  No open wound.  Dorsal redness.  Exquisitely tender to palpation.  Psychiatric: She has a normal mood and affect. Her behavior is normal.  Nursing note and vitals reviewed.  UC Treatments / Results  Labs (all labs ordered are listed, but only abnormal results are displayed) Labs Reviewed - No data to display  EKG None  Radiology No results found.  Procedures Procedures (including critical care time)  Medications Ordered in UC Medications - No data to display  Initial  Impression / Assessment and Plan / UC Course  I have reviewed the triage vital signs and the nursing  notes.  Pertinent labs & imaging results that were available during my care of the patient were reviewed by me and considered in my medical decision making (see chart for details).    72 year old female with an extensive past medical history presents with pain of the left fifth toe.  Possible early cellulitis.  Placing on Keflex.  Advised to follow-up with podiatry.  Final Clinical Impressions(s) / UC Diagnoses   Final diagnoses:  Pain of toe of left foot     Discharge Instructions     Antibiotic as prescribed.  Follow up with Dr. Elvina Mattes.  Take care  Dr. Lacinda Axon    ED Prescriptions    Medication Sig Dispense Auth. Provider   cephALEXin (KEFLEX) 500 MG capsule Take 1 capsule (500 mg total) by mouth 4 (four) times daily. 28 capsule Coral Spikes, DO     Controlled Substance Prescriptions [place] Controlled Substance Registry consulted? Not Applicable   Coral Spikes, DO 03/29/18 1442

## 2018-03-29 NOTE — ED Triage Notes (Signed)
Pt with left 5th toe pain, redness x past two weeks. "It started out as a callous."

## 2018-03-29 NOTE — Discharge Instructions (Signed)
Antibiotic as prescribed.  Follow up with Dr. Elvina Mattes.  Take care  Dr. Lacinda Axon

## 2018-04-03 ENCOUNTER — Ambulatory Visit
Admission: RE | Admit: 2018-04-03 | Discharge: 2018-04-03 | Disposition: A | Discharge status: Home / Self Care | Payer: PPO | Source: Ambulatory Visit | Attending: Surgery | Admitting: Surgery

## 2018-04-03 DIAGNOSIS — M11211 Other chondrocalcinosis, right shoulder: Secondary | ICD-10-CM | POA: Diagnosis not present

## 2018-04-03 DIAGNOSIS — M75101 Unspecified rotator cuff tear or rupture of right shoulder, not specified as traumatic: Secondary | ICD-10-CM | POA: Insufficient documentation

## 2018-04-03 DIAGNOSIS — M12811 Other specific arthropathies, not elsewhere classified, right shoulder: Secondary | ICD-10-CM | POA: Insufficient documentation

## 2018-04-03 DIAGNOSIS — M75121 Complete rotator cuff tear or rupture of right shoulder, not specified as traumatic: Secondary | ICD-10-CM | POA: Diagnosis not present

## 2018-04-03 DIAGNOSIS — M19011 Primary osteoarthritis, right shoulder: Secondary | ICD-10-CM | POA: Insufficient documentation

## 2018-04-03 DIAGNOSIS — M24011 Loose body in right shoulder: Secondary | ICD-10-CM | POA: Insufficient documentation

## 2018-04-03 DIAGNOSIS — M7581 Other shoulder lesions, right shoulder: Secondary | ICD-10-CM

## 2018-04-04 ENCOUNTER — Encounter
Admission: RE | Admit: 2018-04-04 | Discharge: 2018-04-04 | Disposition: A | Discharge status: Home / Self Care | Payer: PPO | Source: Ambulatory Visit | Attending: Surgery | Admitting: Surgery

## 2018-04-04 ENCOUNTER — Other Ambulatory Visit: Payer: Self-pay

## 2018-04-04 DIAGNOSIS — R9431 Abnormal electrocardiogram [ECG] [EKG]: Secondary | ICD-10-CM | POA: Insufficient documentation

## 2018-04-04 DIAGNOSIS — Z01810 Encounter for preprocedural cardiovascular examination: Secondary | ICD-10-CM | POA: Insufficient documentation

## 2018-04-04 DIAGNOSIS — I10 Essential (primary) hypertension: Secondary | ICD-10-CM | POA: Diagnosis not present

## 2018-04-04 HISTORY — DX: Atherosclerotic heart disease of native coronary artery without angina pectoris: I25.10

## 2018-04-04 HISTORY — DX: Peripheral vascular disease, unspecified: I73.9

## 2018-04-04 LAB — BASIC METABOLIC PANEL
ANION GAP: 11 (ref 5–15)
BUN: 24 mg/dL — AB (ref 6–20)
CALCIUM: 10.8 mg/dL — AB (ref 8.9–10.3)
CO2: 27 mmol/L (ref 22–32)
Chloride: 102 mmol/L (ref 101–111)
Creatinine, Ser: 0.65 mg/dL (ref 0.44–1.00)
GFR calc Af Amer: >60 mL/min (ref >60)
GFR calc non Af Amer: >60 mL/min (ref >60)
GLUCOSE: 113 mg/dL — AB (ref 65–99)
Potassium: 3.5 mmol/L (ref 3.5–5.1)
Sodium: 140 mmol/L (ref 135–145)

## 2018-04-04 LAB — TYPE AND SCREEN
ABO/RH(D): O NEG
ANTIBODY SCREEN: NEGATIVE

## 2018-04-04 LAB — CBC
HCT: 39.5 % (ref 35.0–47.0)
Hemoglobin: 13.8 g/dL (ref 12.0–16.0)
MCH: 29.6 pg (ref 26.0–34.0)
MCHC: 34.9 g/dL (ref 32.0–36.0)
MCV: 84.8 fL (ref 80.0–100.0)
PLATELETS: 252 10*3/uL (ref 150–440)
RBC: 4.66 MIL/uL (ref 3.80–5.20)
RDW: 14.5 % (ref 11.5–14.5)
WBC: 7.4 10*3/uL (ref 3.6–11.0)

## 2018-04-04 LAB — URINALYSIS, COMPLETE (UACMP) WITH MICROSCOPIC
BILIRUBIN URINE: NEGATIVE
Bacteria, UA: NONE SEEN
Glucose, UA: NEGATIVE mg/dL
HGB URINE DIPSTICK: NEGATIVE
Ketones, ur: NEGATIVE mg/dL
Leukocytes, UA: NEGATIVE
Nitrite: NEGATIVE
PROTEIN: NEGATIVE mg/dL
Specific Gravity, Urine: 1.008 (ref 1.005–1.030)
pH: 8 (ref 5.0–8.0)

## 2018-04-04 LAB — SURGICAL PCR SCREEN
MRSA, PCR: POSITIVE — AB
STAPHYLOCOCCUS AUREUS: POSITIVE — AB

## 2018-04-04 LAB — PROTIME-INR
INR: 0.95
Prothrombin Time: 12.6 seconds (ref 11.4–15.2)

## 2018-04-04 NOTE — Patient Instructions (Addendum)
Your procedure is scheduled on: 04/08/18 Tues Report to Same Day Surgery 2nd floor medical mall [facility] Entrance-take elevator on left to 2nd floor.  Check in with surgery information desk.) To find out your arrival time please call [phone] between 1PM - 3PM on 04/07/18 Mon  Remember: Instructions that are not followed completely may result in serious medical risk, up to and including death, or upon the discretion of your surgeon and anesthesiologist your surgery may need to be rescheduled.    _x___ 1. Do not eat food after midnight the night before your procedure. You may drink clear liquids up to 2 hours before you are scheduled to arrive at the hospital for your procedure.  Do not drink clear liquids within 2 hours of your scheduled arrival to the hospital.  Clear liquids include  --Water or Apple juice without pulp  --Clear carbohydrate beverage such as ClearFast or Gatorade  --Black Coffee or Clear Tea (No milk, no creamers, do not add anything to                  the coffee or Tea Type 1 and type 2 diabetics should only drink water.  No gum chewing or hard candies.     __x__ 2. No Alcohol for 24 hours before or after surgery.   __x__3. No Smoking or e-cigarettes for 24 prior to surgery.  Do not use any chewable tobacco products for at least 6 hour prior to surgery   ____  4. Bring all medications with you on the day of surgery if instructed.    __x__ 5. Notify your doctor if there is any change in your medical condition     (cold, fever, infections).    x___6. On the morning of surgery brush your teeth with toothpaste and water.  You may rinse your mouth with mouth wash if you wish.  Do not swallow any toothpaste or mouthwash.   Do not wear jewelry, make-up, hairpins, clips or nail polish.  Do not wear lotions, powders, or perfumes. You may wear deodorant.  Do not shave 48 hours prior to surgery. Men may shave face and neck.  Do not bring valuables to the hospital.     [facility] is not responsible for any belongings or valuables.               Contacts, dentures or bridgework may not be worn into surgery.  Leave your suitcase in the car. After surgery it may be brought to your room.  For patients admitted to the hospital, discharge time is determined by your                       treatment team.  _  Patients discharged the day of surgery will not be allowed to drive home.  You will need someone to drive you home and stay with you the night of your procedure.    Please read over the following fact sheets that you were given:   [facility] Preparing for Surgery and or MRSA Information   _x___ Take anti-hypertensive listed below, cardiac, seizure, asthma,     anti-reflux and psychiatric medicines. These include:  1. fenofibrate (TRICOR) 145 MG tablet  2.metoprolol succinate (TOPROL-XL) 100 MG 24 hr tablet  3.  4.  5.  6.  ____Fleets enema or Magnesium Citrate as directed.   _x___ Use CHG Soap or sage wipes as directed on instruction sheet   ____ Use inhalers on the  day of surgery and bring to hospital day of surgery  __x__ Stop Metformin and Janumet 2 days prior to surgery.    ____ Take 1/2 of usual insulin dose the night before surgery and none on the morning     surgery.   _x___ Follow recommendations from Cardiologist, Pulmonologist or PCP regarding          stopping Aspirin, Coumadin, Plavix ,Eliquis, Effient, or Pradaxa, and Pletal.  Stopped Plavix on 04/03/18  X____Stop Anti-inflammatories such as Advil, Aleve, Ibuprofen, Motrin, Naproxen, Naprosyn, Goodies powders or aspirin products. OK to take Tylenol and                          Celebrex.   _x___ Stop supplements until after surgery.  But may continue Vitamin D, Vitamin B,       and multivitamin.  Stop fish oil and vitamin E today.   ____ Bring C-Pap to the hospital.

## 2018-04-05 LAB — URINE CULTURE: Culture: NO GROWTH

## 2018-04-08 ENCOUNTER — Inpatient Hospital Stay: Payer: PPO | Admitting: Anesthesiology

## 2018-04-08 ENCOUNTER — Encounter
Admission: RE | Disposition: A | Discharge status: Home Health | Payer: Self-pay | Source: Ambulatory Visit | Attending: Surgery

## 2018-04-08 ENCOUNTER — Other Ambulatory Visit: Payer: Self-pay

## 2018-04-08 ENCOUNTER — Encounter: Payer: Self-pay | Admitting: *Deleted

## 2018-04-08 ENCOUNTER — Inpatient Hospital Stay: Payer: PPO

## 2018-04-08 ENCOUNTER — Inpatient Hospital Stay
Admission: RE | Admit: 2018-04-08 | Discharge: 2018-04-09 | DRG: 483 | Disposition: A | Discharge status: Home Health | Payer: PPO | Source: Ambulatory Visit | Attending: Surgery | Admitting: Surgery

## 2018-04-08 DIAGNOSIS — Z87891 Personal history of nicotine dependence: Secondary | ICD-10-CM

## 2018-04-08 DIAGNOSIS — I2510 Atherosclerotic heart disease of native coronary artery without angina pectoris: Secondary | ICD-10-CM | POA: Diagnosis present

## 2018-04-08 DIAGNOSIS — I739 Peripheral vascular disease, unspecified: Secondary | ICD-10-CM | POA: Diagnosis not present

## 2018-04-08 DIAGNOSIS — E119 Type 2 diabetes mellitus without complications: Secondary | ICD-10-CM | POA: Diagnosis not present

## 2018-04-08 DIAGNOSIS — E785 Hyperlipidemia, unspecified: Secondary | ICD-10-CM | POA: Diagnosis present

## 2018-04-08 DIAGNOSIS — E1151 Type 2 diabetes mellitus with diabetic peripheral angiopathy without gangrene: Secondary | ICD-10-CM | POA: Diagnosis present

## 2018-04-08 DIAGNOSIS — Z7984 Long term (current) use of oral hypoglycemic drugs: Secondary | ICD-10-CM

## 2018-04-08 DIAGNOSIS — Z7982 Long term (current) use of aspirin: Secondary | ICD-10-CM

## 2018-04-08 DIAGNOSIS — Z23 Encounter for immunization: Secondary | ICD-10-CM

## 2018-04-08 DIAGNOSIS — Z7902 Long term (current) use of antithrombotics/antiplatelets: Secondary | ICD-10-CM | POA: Diagnosis not present

## 2018-04-08 DIAGNOSIS — G8918 Other acute postprocedural pain: Secondary | ICD-10-CM | POA: Diagnosis not present

## 2018-04-08 DIAGNOSIS — Z955 Presence of coronary angioplasty implant and graft: Secondary | ICD-10-CM

## 2018-04-08 DIAGNOSIS — M75121 Complete rotator cuff tear or rupture of right shoulder, not specified as traumatic: Secondary | ICD-10-CM | POA: Diagnosis not present

## 2018-04-08 DIAGNOSIS — M19011 Primary osteoarthritis, right shoulder: Secondary | ICD-10-CM | POA: Diagnosis present

## 2018-04-08 DIAGNOSIS — M25511 Pain in right shoulder: Secondary | ICD-10-CM | POA: Diagnosis not present

## 2018-04-08 DIAGNOSIS — M12811 Other specific arthropathies, not elsewhere classified, right shoulder: Secondary | ICD-10-CM | POA: Diagnosis not present

## 2018-04-08 DIAGNOSIS — M75101 Unspecified rotator cuff tear or rupture of right shoulder, not specified as traumatic: Principal | ICD-10-CM | POA: Diagnosis present

## 2018-04-08 DIAGNOSIS — Z471 Aftercare following joint replacement surgery: Secondary | ICD-10-CM | POA: Diagnosis not present

## 2018-04-08 DIAGNOSIS — Z85828 Personal history of other malignant neoplasm of skin: Secondary | ICD-10-CM

## 2018-04-08 DIAGNOSIS — Z79899 Other long term (current) drug therapy: Secondary | ICD-10-CM

## 2018-04-08 DIAGNOSIS — I10 Essential (primary) hypertension: Secondary | ICD-10-CM | POA: Diagnosis present

## 2018-04-08 DIAGNOSIS — Z91048 Other nonmedicinal substance allergy status: Secondary | ICD-10-CM | POA: Diagnosis not present

## 2018-04-08 DIAGNOSIS — Z96611 Presence of right artificial shoulder joint: Secondary | ICD-10-CM | POA: Diagnosis not present

## 2018-04-08 DIAGNOSIS — M7581 Other shoulder lesions, right shoulder: Secondary | ICD-10-CM | POA: Diagnosis not present

## 2018-04-08 HISTORY — PX: REVERSE SHOULDER ARTHROPLASTY: SHX5054

## 2018-04-08 LAB — GLUCOSE, CAPILLARY
GLUCOSE-CAPILLARY: 148 mg/dL — AB (ref 70–99)
GLUCOSE-CAPILLARY: 221 mg/dL — AB (ref 70–99)
Glucose-Capillary: 180 mg/dL — ABNORMAL HIGH (ref 70–99)
Glucose-Capillary: 166 mg/dL — ABNORMAL HIGH (ref 70–99)

## 2018-04-08 LAB — ABO/RH: ABO/RH(D): O NEG

## 2018-04-08 SURGERY — ARTHROPLASTY, SHOULDER, TOTAL, REVERSE
Anesthesia: General | Site: Shoulder | Laterality: Right | Wound class: Clean

## 2018-04-08 MED ORDER — LIDOCAINE HCL (PF) 1 % IJ SOLN
INTRAMUSCULAR | Status: AC
  Filled 2018-04-08: qty 5

## 2018-04-08 MED ORDER — HYDROCHLOROTHIAZIDE 25 MG PO TABS
25.0000 mg | ORAL_TABLET | Freq: Every day | ORAL | Status: DC
  Administered 2018-04-08 – 2018-04-09 (×2): 25 mg via ORAL
  Filled 2018-04-08 (×2): qty 1

## 2018-04-08 MED ORDER — LOSARTAN POTASSIUM-HCTZ 100-25 MG PO TABS: 1.0000 | ORAL_TABLET | Freq: Every day | ORAL | Status: DC

## 2018-04-08 MED ORDER — KETOROLAC TROMETHAMINE 15 MG/ML IJ SOLN
7.5000 mg | Freq: Four times a day (QID) | INTRAMUSCULAR | Status: DC
  Administered 2018-04-09: 7.5 mg via INTRAVENOUS
  Filled 2018-04-08 (×2): qty 1

## 2018-04-08 MED ORDER — PNEUMOCOCCAL VAC POLYVALENT 25 MCG/0.5ML IJ INJ
0.5000 mL | INJECTION | INTRAMUSCULAR | Status: AC
  Administered 2018-04-09: 0.5 mL via INTRAMUSCULAR
  Filled 2018-04-08: qty 0.5

## 2018-04-08 MED ORDER — METFORMIN HCL 500 MG PO TABS
1000.0000 mg | ORAL_TABLET | Freq: Every day | ORAL | Status: DC
  Administered 2018-04-09: 1000 mg via ORAL
  Filled 2018-04-08: qty 2

## 2018-04-08 MED ORDER — FENTANYL CITRATE (PF) 100 MCG/2ML IJ SOLN: 25.0000 ug | INTRAMUSCULAR | Status: DC | PRN

## 2018-04-08 MED ORDER — VANCOMYCIN HCL IN DEXTROSE 1-5 GM/200ML-% IV SOLN
INTRAVENOUS | Status: AC
  Filled 2018-04-08: qty 200

## 2018-04-08 MED ORDER — FENOFIBRATE 160 MG PO TABS
160.0000 mg | ORAL_TABLET | Freq: Every day | ORAL | Status: DC
  Administered 2018-04-09: 160 mg via ORAL
  Filled 2018-04-08 (×2): qty 1

## 2018-04-08 MED ORDER — KETOROLAC TROMETHAMINE 15 MG/ML IJ SOLN
INTRAMUSCULAR | Status: AC
  Filled 2018-04-08: qty 1

## 2018-04-08 MED ORDER — CHLORHEXIDINE GLUCONATE CLOTH 2 % EX PADS
6.0000 | MEDICATED_PAD | Freq: Every day | CUTANEOUS | Status: DC
  Administered 2018-04-09: 6 via TOPICAL

## 2018-04-08 MED ORDER — NEOMYCIN-POLYMYXIN B GU 40-200000 IR SOLN
Status: DC | PRN
  Administered 2018-04-08: 14 mL

## 2018-04-08 MED ORDER — LIDOCAINE HCL (PF) 1 % IJ SOLN
INTRAMUSCULAR | Status: DC | PRN
  Administered 2018-04-08: 5 mL via SUBCUTANEOUS

## 2018-04-08 MED ORDER — LOSARTAN POTASSIUM 50 MG PO TABS
100.0000 mg | ORAL_TABLET | Freq: Every day | ORAL | Status: DC
  Administered 2018-04-08 – 2018-04-09 (×2): 100 mg via ORAL
  Filled 2018-04-08 (×2): qty 2

## 2018-04-08 MED ORDER — LIDOCAINE HCL (PF) 2 % IJ SOLN
INTRAMUSCULAR | Status: AC
  Filled 2018-04-08: qty 10

## 2018-04-08 MED ORDER — BUPIVACAINE LIPOSOME 1.3 % IJ SUSP
INTRAMUSCULAR | Status: DC | PRN
  Administered 2018-04-08: 20 mL via PERINEURAL

## 2018-04-08 MED ORDER — METOCLOPRAMIDE HCL 5 MG/ML IJ SOLN
5.0000 mg | Freq: Three times a day (TID) | INTRAMUSCULAR | Status: DC | PRN
  Administered 2018-04-08: 10 mg via INTRAVENOUS
  Filled 2018-04-08: qty 2

## 2018-04-08 MED ORDER — PROPOFOL 10 MG/ML IV BOLUS
INTRAVENOUS | Status: DC | PRN
  Administered 2018-04-08: 150 mg via INTRAVENOUS
  Administered 2018-04-08: 50 mg via INTRAVENOUS

## 2018-04-08 MED ORDER — SODIUM CHLORIDE 0.9 % IV SOLN
INTRAVENOUS | Status: DC
  Administered 2018-04-08: 16:00:00 via INTRAVENOUS

## 2018-04-08 MED ORDER — ATORVASTATIN CALCIUM 20 MG PO TABS
20.0000 mg | ORAL_TABLET | Freq: Every day | ORAL | Status: DC
  Administered 2018-04-08 – 2018-04-09 (×2): 20 mg via ORAL
  Filled 2018-04-08 (×2): qty 1

## 2018-04-08 MED ORDER — BUPIVACAINE HCL (PF) 0.5 % IJ SOLN
INTRAMUSCULAR | Status: DC | PRN
  Administered 2018-04-08: 10 mL via PERINEURAL

## 2018-04-08 MED ORDER — MIDAZOLAM HCL 2 MG/2ML IJ SOLN
INTRAMUSCULAR | Status: AC
  Filled 2018-04-08: qty 2

## 2018-04-08 MED ORDER — ONDANSETRON HCL 4 MG/2ML IJ SOLN: 4.0000 mg | Freq: Once | INTRAMUSCULAR | Status: DC | PRN

## 2018-04-08 MED ORDER — GLIMEPIRIDE 2 MG PO TABS
4.0000 mg | ORAL_TABLET | Freq: Every day | ORAL | Status: DC
  Administered 2018-04-09: 4 mg via ORAL
  Filled 2018-04-08: qty 1
  Filled 2018-04-08: qty 2
  Filled 2018-04-08: qty 1

## 2018-04-08 MED ORDER — FENTANYL CITRATE (PF) 100 MCG/2ML IJ SOLN
INTRAMUSCULAR | Status: AC
  Administered 2018-04-08: 50 ug via INTRAVENOUS
  Filled 2018-04-08: qty 2

## 2018-04-08 MED ORDER — ONDANSETRON HCL 4 MG/2ML IJ SOLN
INTRAMUSCULAR | Status: AC
  Filled 2018-04-08: qty 2

## 2018-04-08 MED ORDER — BUPIVACAINE-EPINEPHRINE (PF) 0.5% -1:200000 IJ SOLN
INTRAMUSCULAR | Status: DC | PRN
  Administered 2018-04-08: 30 mL via PERINEURAL

## 2018-04-08 MED ORDER — PHENYLEPHRINE HCL 10 MG/ML IJ SOLN
INTRAMUSCULAR | Status: DC | PRN
  Administered 2018-04-08: 100 ug via INTRAVENOUS

## 2018-04-08 MED ORDER — MAGNESIUM OXIDE 400 MG PO TABS: 400.0000 mg | ORAL_TABLET | Freq: Every day | ORAL | Status: DC

## 2018-04-08 MED ORDER — ASPIRIN EC 81 MG PO TBEC
81.0000 mg | DELAYED_RELEASE_TABLET | Freq: Every day | ORAL | Status: DC
  Administered 2018-04-08 – 2018-04-09 (×2): 81 mg via ORAL
  Filled 2018-04-08 (×2): qty 1

## 2018-04-08 MED ORDER — BISACODYL 10 MG RE SUPP: 10.0000 mg | Freq: Every day | RECTAL | Status: DC | PRN

## 2018-04-08 MED ORDER — METOCLOPRAMIDE HCL 10 MG PO TABS: 5.0000 mg | ORAL_TABLET | Freq: Three times a day (TID) | ORAL | Status: DC | PRN

## 2018-04-08 MED ORDER — ACETAMINOPHEN 325 MG PO TABS: 325.0000 mg | ORAL_TABLET | Freq: Four times a day (QID) | ORAL | Status: DC | PRN

## 2018-04-08 MED ORDER — MIDAZOLAM HCL 2 MG/2ML IJ SOLN
INTRAMUSCULAR | Status: DC | PRN
  Administered 2018-04-08: 2 mg via INTRAVENOUS

## 2018-04-08 MED ORDER — FENTANYL CITRATE (PF) 100 MCG/2ML IJ SOLN
INTRAMUSCULAR | Status: DC | PRN
  Administered 2018-04-08: 50 ug via INTRAVENOUS
  Administered 2018-04-08 (×2): 25 ug via INTRAVENOUS
  Administered 2018-04-08: 75 ug via INTRAVENOUS
  Administered 2018-04-08: 25 ug via INTRAVENOUS
  Administered 2018-04-08: 50 ug via INTRAVENOUS

## 2018-04-08 MED ORDER — SUGAMMADEX SODIUM 200 MG/2ML IV SOLN
INTRAVENOUS | Status: DC | PRN
  Administered 2018-04-08: 200 mg via INTRAVENOUS

## 2018-04-08 MED ORDER — LIDOCAINE HCL (CARDIAC) PF 100 MG/5ML IV SOSY
PREFILLED_SYRINGE | INTRAVENOUS | Status: DC | PRN
  Administered 2018-04-08 (×2): 100 mg via INTRAVENOUS

## 2018-04-08 MED ORDER — ONDANSETRON HCL 4 MG/2ML IJ SOLN
INTRAMUSCULAR | Status: DC | PRN
  Administered 2018-04-08: 4 mg via INTRAVENOUS

## 2018-04-08 MED ORDER — ACETAMINOPHEN 500 MG PO TABS
1000.0000 mg | ORAL_TABLET | Freq: Four times a day (QID) | ORAL | Status: DC
  Administered 2018-04-09 (×2): 1000 mg via ORAL
  Filled 2018-04-08 (×2): qty 2

## 2018-04-08 MED ORDER — PROPOFOL 10 MG/ML IV BOLUS
INTRAVENOUS | Status: AC
  Filled 2018-04-08: qty 20

## 2018-04-08 MED ORDER — DOCUSATE SODIUM 100 MG PO CAPS
100.0000 mg | ORAL_CAPSULE | Freq: Two times a day (BID) | ORAL | Status: DC
  Administered 2018-04-08 – 2018-04-09 (×2): 100 mg via ORAL
  Filled 2018-04-08 (×2): qty 1

## 2018-04-08 MED ORDER — FLEET ENEMA 7-19 GM/118ML RE ENEM: 1.0000 | ENEMA | Freq: Once | RECTAL | Status: DC | PRN

## 2018-04-08 MED ORDER — CLOPIDOGREL BISULFATE 75 MG PO TABS
75.0000 mg | ORAL_TABLET | Freq: Every day | ORAL | Status: DC
  Administered 2018-04-08 – 2018-04-09 (×2): 75 mg via ORAL
  Filled 2018-04-08 (×2): qty 1

## 2018-04-08 MED ORDER — BUPIVACAINE LIPOSOME 1.3 % IJ SUSP
INTRAMUSCULAR | Status: AC
  Filled 2018-04-08: qty 20

## 2018-04-08 MED ORDER — ROCURONIUM BROMIDE 50 MG/5ML IV SOLN
INTRAVENOUS | Status: AC
  Filled 2018-04-08: qty 2

## 2018-04-08 MED ORDER — MAG GLYCINATE 100 MG PO TABS: 162.0000 mg | ORAL_TABLET | Freq: Every day | ORAL | Status: DC

## 2018-04-08 MED ORDER — SUGAMMADEX SODIUM 200 MG/2ML IV SOLN
INTRAVENOUS | Status: AC
  Filled 2018-04-08: qty 2

## 2018-04-08 MED ORDER — MIDAZOLAM HCL 2 MG/2ML IJ SOLN
INTRAMUSCULAR | Status: AC
  Administered 2018-04-08: 1 mg via INTRAVENOUS
  Filled 2018-04-08: qty 2

## 2018-04-08 MED ORDER — PANTOPRAZOLE SODIUM 40 MG PO TBEC
40.0000 mg | DELAYED_RELEASE_TABLET | Freq: Every day | ORAL | Status: DC
  Administered 2018-04-09: 40 mg via ORAL
  Filled 2018-04-08 (×2): qty 1

## 2018-04-08 MED ORDER — VANCOMYCIN HCL IN DEXTROSE 1-5 GM/200ML-% IV SOLN
1000.0000 mg | Freq: Once | INTRAVENOUS | Status: AC
  Administered 2018-04-08: 1000 mg via INTRAVENOUS

## 2018-04-08 MED ORDER — DIPHENHYDRAMINE HCL 12.5 MG/5ML PO ELIX: 12.5000 mg | ORAL_SOLUTION | ORAL | Status: DC | PRN

## 2018-04-08 MED ORDER — HYDRALAZINE HCL 20 MG/ML IJ SOLN
10.0000 mg | Freq: Four times a day (QID) | INTRAMUSCULAR | Status: DC | PRN
  Filled 2018-04-08: qty 1

## 2018-04-08 MED ORDER — FENTANYL CITRATE (PF) 100 MCG/2ML IJ SOLN
50.0000 ug | Freq: Once | INTRAMUSCULAR | Status: AC
  Administered 2018-04-08: 50 ug via INTRAVENOUS

## 2018-04-08 MED ORDER — FAMOTIDINE 20 MG PO TABS
20.0000 mg | ORAL_TABLET | Freq: Once | ORAL | Status: AC
  Administered 2018-04-08: 20 mg via ORAL

## 2018-04-08 MED ORDER — MAGNESIUM HYDROXIDE 400 MG/5ML PO SUSP: 30.0000 mL | Freq: Every day | ORAL | Status: DC | PRN

## 2018-04-08 MED ORDER — HYDROMORPHONE HCL 1 MG/ML IJ SOLN: 0.5000 mg | INTRAMUSCULAR | Status: DC | PRN

## 2018-04-08 MED ORDER — FENTANYL CITRATE (PF) 250 MCG/5ML IJ SOLN
INTRAMUSCULAR | Status: AC
  Filled 2018-04-08: qty 5

## 2018-04-08 MED ORDER — MIDAZOLAM HCL 2 MG/2ML IJ SOLN
1.0000 mg | Freq: Once | INTRAMUSCULAR | Status: AC
  Administered 2018-04-08: 1 mg via INTRAVENOUS

## 2018-04-08 MED ORDER — INSULIN ASPART 100 UNIT/ML ~~LOC~~ SOLN
0.0000 [IU] | Freq: Three times a day (TID) | SUBCUTANEOUS | Status: DC
  Administered 2018-04-08: 5 [IU] via SUBCUTANEOUS
  Filled 2018-04-08 (×2): qty 1

## 2018-04-08 MED ORDER — FAMOTIDINE 20 MG PO TABS
ORAL_TABLET | ORAL | Status: AC
  Administered 2018-04-08: 20 mg via ORAL
  Filled 2018-04-08: qty 1

## 2018-04-08 MED ORDER — MUPIROCIN 2 % EX OINT
1.0000 "application " | TOPICAL_OINTMENT | Freq: Two times a day (BID) | CUTANEOUS | Status: DC
  Administered 2018-04-08 – 2018-04-09 (×2): 1 via NASAL
  Filled 2018-04-08: qty 22

## 2018-04-08 MED ORDER — CALCIUM CARBONATE ANTACID 500 MG PO CHEW
1.0000 | CHEWABLE_TABLET | Freq: Two times a day (BID) | ORAL | Status: DC
  Administered 2018-04-09: 200 mg via ORAL
  Filled 2018-04-08: qty 1

## 2018-04-08 MED ORDER — ONDANSETRON HCL 4 MG/2ML IJ SOLN
4.0000 mg | Freq: Four times a day (QID) | INTRAMUSCULAR | Status: DC | PRN
  Administered 2018-04-08: 4 mg via INTRAVENOUS
  Filled 2018-04-08: qty 2

## 2018-04-08 MED ORDER — OXYCODONE HCL 5 MG PO TABS: 10.0000 mg | ORAL_TABLET | ORAL | Status: DC | PRN

## 2018-04-08 MED ORDER — VANCOMYCIN HCL IN DEXTROSE 1-5 GM/200ML-% IV SOLN
1000.0000 mg | Freq: Two times a day (BID) | INTRAVENOUS | Status: AC
  Administered 2018-04-08: 1000 mg via INTRAVENOUS
  Filled 2018-04-08: qty 200

## 2018-04-08 MED ORDER — ONDANSETRON HCL 4 MG/2ML IJ SOLN: 4.0000 mg | Freq: Four times a day (QID) | INTRAMUSCULAR | Status: DC | PRN

## 2018-04-08 MED ORDER — ONDANSETRON HCL 4 MG PO TABS: 4.0000 mg | ORAL_TABLET | Freq: Four times a day (QID) | ORAL | Status: DC | PRN

## 2018-04-08 MED ORDER — SODIUM CHLORIDE 0.9 % IV SOLN
INTRAVENOUS | Status: DC
  Administered 2018-04-08 (×2): via INTRAVENOUS

## 2018-04-08 MED ORDER — ROCURONIUM BROMIDE 100 MG/10ML IV SOLN
INTRAVENOUS | Status: DC | PRN
  Administered 2018-04-08: 80 mg via INTRAVENOUS

## 2018-04-08 MED ORDER — METOPROLOL SUCCINATE ER 50 MG PO TB24
100.0000 mg | ORAL_TABLET | Freq: Every day | ORAL | Status: DC
  Administered 2018-04-09: 100 mg via ORAL
  Filled 2018-04-08: qty 2

## 2018-04-08 MED ORDER — ASPIRIN 81 MG PO TABS: 81.0000 mg | ORAL_TABLET | Freq: Every day | ORAL | Status: DC

## 2018-04-08 MED ORDER — CALCIUM CARBONATE 600 MG PO TABS: 600.0000 mg | ORAL_TABLET | Freq: Two times a day (BID) | ORAL | Status: DC

## 2018-04-08 MED ORDER — GLUCOSAMINE CHOND COMPLEX/MSM PO TABS: ORAL_TABLET | Freq: Every day | ORAL | Status: DC

## 2018-04-08 MED ORDER — ENOXAPARIN SODIUM 40 MG/0.4ML ~~LOC~~ SOLN
40.0000 mg | SUBCUTANEOUS | Status: DC
  Filled 2018-04-08: qty 0.4

## 2018-04-08 MED ORDER — OXYCODONE HCL 5 MG PO TABS: 5.0000 mg | ORAL_TABLET | ORAL | Status: DC | PRN

## 2018-04-08 MED ORDER — PHENYLEPHRINE HCL 10 MG/ML IJ SOLN
INTRAVENOUS | Status: DC | PRN
  Administered 2018-04-08: 30 ug/min via INTRAVENOUS

## 2018-04-08 MED ORDER — BUPIVACAINE HCL (PF) 0.5 % IJ SOLN
INTRAMUSCULAR | Status: AC
  Filled 2018-04-08: qty 10

## 2018-04-08 MED ORDER — OMEGA-3-ACID ETHYL ESTERS 1 G PO CAPS
1.0000 g | ORAL_CAPSULE | Freq: Every day | ORAL | Status: DC
  Administered 2018-04-09: 1 g via ORAL
  Filled 2018-04-08: qty 1

## 2018-04-08 MED ORDER — KETOROLAC TROMETHAMINE 15 MG/ML IJ SOLN
15.0000 mg | Freq: Once | INTRAMUSCULAR | Status: AC
  Administered 2018-04-08: 15 mg via INTRAVENOUS

## 2018-04-08 MED ORDER — OMEGA-3 FATTY ACIDS 1000 MG PO CAPS: 1.0000 g | ORAL_CAPSULE | Freq: Every day | ORAL | Status: DC

## 2018-04-08 MED ORDER — VITAMIN E 180 MG (400 UNIT) PO CAPS
400.0000 [IU] | ORAL_CAPSULE | Freq: Every day | ORAL | Status: DC
  Administered 2018-04-09: 400 [IU] via ORAL
  Filled 2018-04-08 (×2): qty 1

## 2018-04-08 MED ORDER — MAGNESIUM OXIDE 400 (241.3 MG) MG PO TABS
400.0000 mg | ORAL_TABLET | Freq: Every day | ORAL | Status: DC
  Administered 2018-04-09: 400 mg via ORAL
  Filled 2018-04-08: qty 1

## 2018-04-08 MED ORDER — TRAMADOL HCL 50 MG PO TABS: 50.0000 mg | ORAL_TABLET | Freq: Four times a day (QID) | ORAL | Status: DC | PRN

## 2018-04-08 SURGICAL SUPPLY — 66 items
BIT DRILL 2.5 (BIT) ×1
BIT DRILL 2.5X4.5XSCR (BIT) ×1 IMPLANT
BIT DRL 2.5X4.5XSCR (BIT) ×1
BLADE SAGITTAL WIDE XTHICK NO (BLADE) ×3 IMPLANT
CANISTER SUCT 1200ML W/VALVE (MISCELLANEOUS) ×3 IMPLANT
CANISTER SUCT 3000ML PPV (MISCELLANEOUS) ×6 IMPLANT
CHLORAPREP W/TINT 26ML (MISCELLANEOUS) ×3 IMPLANT
COOLER POLAR GLACIER W/PUMP (MISCELLANEOUS) ×3 IMPLANT
CRADLE LAMINECT ARM (MISCELLANEOUS) ×3 IMPLANT
DRAPE IMP U-DRAPE 54X76 (DRAPES) ×6 IMPLANT
DRAPE INCISE IOBAN 66X45 STRL (DRAPES) ×6 IMPLANT
DRAPE INCISE IOBAN 66X60 STRL (DRAPES) ×3 IMPLANT
DRAPE SHEET LG 3/4 BI-LAMINATE (DRAPES) ×6 IMPLANT
DRAPE TABLE BACK 80X90 (DRAPES) ×3 IMPLANT
DRILL 2.5MM (BIT) ×1
DRSG OPSITE POSTOP 4X8 (GAUZE/BANDAGES/DRESSINGS) ×3 IMPLANT
ELECT BLADE 6.5 EXT (BLADE) ×3 IMPLANT
ELECT CAUTERY BLADE 6.4 (BLADE) ×3 IMPLANT
GLENOSPHERE RSS 2 CONCENTRIC (Shoulder) ×3 IMPLANT
GLOVE BIO SURGEON STRL SZ7.5 (GLOVE) ×12 IMPLANT
GLOVE BIO SURGEON STRL SZ8 (GLOVE) ×12 IMPLANT
GLOVE BIOGEL PI IND STRL 8 (GLOVE) ×1 IMPLANT
GLOVE BIOGEL PI INDICATOR 8 (GLOVE) ×2
GLOVE INDICATOR 8.0 STRL GRN (GLOVE) ×3 IMPLANT
GOWN STRL REUS W/ TWL LRG LVL3 (GOWN DISPOSABLE) ×1 IMPLANT
GOWN STRL REUS W/ TWL XL LVL3 (GOWN DISPOSABLE) ×1 IMPLANT
GOWN STRL REUS W/TWL LRG LVL3 (GOWN DISPOSABLE) ×2
GOWN STRL REUS W/TWL XL LVL3 (GOWN DISPOSABLE) ×2
GUIDE PIN 2.0 S150MM (PIN) ×3 IMPLANT
HOOD PEEL AWAY FLYTE STAYCOOL (MISCELLANEOUS) ×9 IMPLANT
KIT STABILIZATION SHOULDER (MISCELLANEOUS) ×3 IMPLANT
KIT TURNOVER KIT A (KITS) ×3 IMPLANT
LINER STD POLY +0 (Liner) ×3 IMPLANT
MASK FACE SPIDER DISP (MASK) ×3 IMPLANT
NDL SAFETY ECLIPSE 18X1.5 (NEEDLE) ×1 IMPLANT
NEEDLE HYPO 18GX1.5 SHARP (NEEDLE) ×2
NEEDLE HYPO 22GX1.5 SAFETY (NEEDLE) ×3 IMPLANT
NEEDLE SPNL 20GX3.5 QUINCKE YW (NEEDLE) ×3 IMPLANT
NS IRRIG 500ML POUR BTL (IV SOLUTION) ×3 IMPLANT
PACK ARTHROSCOPY SHOULDER (MISCELLANEOUS) ×3 IMPLANT
PAD WRAPON POLAR SHDR UNIV (MISCELLANEOUS) ×1 IMPLANT
PLATE BASE REVERSE RSS S (Plate) ×3 IMPLANT
PULSAVAC PLUS IRRIG FAN TIP (DISPOSABLE) ×3
SCREW 4.5X15 RSS W CAP (Screw) ×6 IMPLANT
SCREW 4.5X25 RSS W CAP (Screw) ×3 IMPLANT
SCREW 4.5X30 RSS W CAP (Screw) ×3 IMPLANT
SCREW 4.5X35 RSS W CAP (Screw) ×3 IMPLANT
SCREW BODY REVERSE SMALL TITAN (Screw) ×3 IMPLANT
SLEEVE PROTECTION STRL DISP (MISCELLANEOUS) ×3 IMPLANT
SLING ULTRA II M (MISCELLANEOUS) ×3 IMPLANT
SOL .9 NS 3000ML IRR  AL (IV SOLUTION) ×2
SOL .9 NS 3000ML IRR UROMATIC (IV SOLUTION) ×1 IMPLANT
SPONGE LAP 18X18 RF (DISPOSABLE) ×3 IMPLANT
STAPLER SKIN PROX 35W (STAPLE) ×3 IMPLANT
STEM HUM 11 (Stem) ×3 IMPLANT
SUT ETHIBOND 0 MO6 C/R (SUTURE) ×3 IMPLANT
SUT FIBERWIRE #2 38 BLUE 1/2 (SUTURE) ×12
SUT VIC AB 0 CT1 36 (SUTURE) ×6 IMPLANT
SUT VIC AB 2-0 CT1 27 (SUTURE) ×4
SUT VIC AB 2-0 CT1 TAPERPNT 27 (SUTURE) ×2 IMPLANT
SUTURE FIBERWR #2 38 BLUE 1/2 (SUTURE) ×4 IMPLANT
SYR 10ML LL (SYRINGE) ×3 IMPLANT
SYR 30ML LL (SYRINGE) ×6 IMPLANT
TIP FAN IRRIG PULSAVAC PLUS (DISPOSABLE) ×1 IMPLANT
TRAY FOLEY MTR SLVR 16FR STAT (SET/KITS/TRAYS/PACK) ×3 IMPLANT
WRAPON POLAR PAD SHDR UNIV (MISCELLANEOUS) ×3

## 2018-04-08 NOTE — Op Note (Addendum)
04/08/2018  2:13 PM  Patient:   Christina Blackburn  Pre-Op Diagnosis:   Massive irreparable rotator cuff tear with cuff arthropathy, right shoulder.  Post-Op Diagnosis:   Same.  Procedure:   Reverse right total shoulder arthroplasty.  Surgeon:   Pascal Lux, MD  Assistant:   Cameron Proud, PA-C  Anesthesia:   General endotracheal with an interscalene block using Exparel placed preoperatively by the anesthesiologist.  Findings:   As above.  Complications:   None  EBL:   250 cc  Fluids:   1200 cc crystalloid  UOP:   None  TT:   None  Drains:   None  Closure:   Staples  Implants:   All press-fit Integra system with an 11 mm stem, a small metaphyseal body, a +0 mm (standard) humeral platform, a mini baseplate, and a 38 mm concentric +2 mm laterally offset glenosphere.  Brief Clinical Note:   The patient is a 72 year old female with a long history of progressively worsening right shoulder pain. Her symptoms have progressed despite medications, activity modification, etc. 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 The patient presents at this time for a reverse right total shoulder arthroplasty.  Procedure:   The patient underwent placement of an interscalene block using Exparel by the anesthesiologist in the preoperative holding area before being brought into the operating room and lain in the supine position. The patient then underwent general endotracheal intubation and anesthesia before the patient was repositioned in the beach chair position using the beach chair positioner. The right shoulder and upper extremity were prepped with ChloraPrep solution before being draped sterilely. Preoperative antibiotics were administered. A standard anterior approach to the shoulder was made through an approximately 4-5 inch incision. The incision was carried down  through the subcutaneous tissues to expose the deltopectoral fascia. The interval between the deltoid and pectoralis muscles was identified and this plane developed, retracting the cephalic vein laterally with the deltoid muscle. The conjoined tendon was identified. Its lateral margin was dissected and the Kolbel self-retraining retractor inserted. The "three sisters" were identified and cauterized. Bursal tissues were removed to improve visualization. The subscapularis tendon was quite attenuated and its superior portion torn and retracted. However, the inferior portion of the subscapularis tendon was still present. It was released from its attachment to the lesser tuberosity 1 cm proximal to its insertion and several tagging sutures placed. The inferior capsule was released with care after identifying and protecting the axillary nerve. The proximal humeral cut was made at approximately 20 of retroversion using the extra-medullary guide.   Attention was redirected to the glenoid. The labrum was debrided circumferentially before the center of the glenoid was identified. The guidewire was drilled into the glenoid neck using the appropriate guide. After verifying its position, it was overreamed with the mini-baseplate reamer to create a flat surface before the stem reamer was utilized. The superior and inferior peg sites were reamed using the appropriate guide to complete the glenoid preparation. The permanent mini-baseplate was impacted into place. It was stabilized with a 30 x 4.5 mm central screw and four peripheral screws. Locking caps were placed over the superior and inferior screws. The permanent 38 mm concentric glenosphere with +2 mm of lateral offset was then impacted into place and its Morse taper locking mechanism verified using manual distraction.  Attention was directed to the humeral side. The humeral canal was prepared  utilizing the tapered stem reamers sequentially beginning with the 7 mm stem  and progressing to an 11 mm stem. This demonstrated a good tight fit. The metaphyseal region was then prepared using the appropriate planar device. The trial body and small stem was put together on the back table and a trial reduction performed using the +0 mm insert. With the +0 mm insert, the arm demonstrated excellent range of motion as the hand could be brought across the chest to the opposite shoulder and brought to the top of the patient's head and to the patient's ear. The shoulder appeared stable throughout this range of motion. The joint was dislocated and the trial components removed. The permanent 11 mm stem with the small body was impacted into place with care taken to maintain the appropriate version. After inserting the screw to connect the body with the stem, a repeat trial reduction with the +0 mm insert again demonstrated excellent stability with the findings as described above. Therefore, the shoulder was re-dislocated and the permanent +0 mm insert impacted into place. After verifying its locking mechanism, the shoulder was relocated using two finger pressure and again placed through a range of motion with the findings as described above.  The wound was copiously irrigated with bacitracin saline solution using the jet lavage system before 30 cc of 0.5% Sensorcaine with epinephrine was injected into the pericapsular and peri-incisional tissues to help with postoperative analgesia. The subscapularis tendon was reapproximated using #2 FiberWire interrupted sutures. The deltopectoral interval was closed using #0 Vicryl interrupted sutures before the subcutaneous tissues were closed using 2-0 Vicryl interrupted sutures. The skin was closed using staples. Prior to closing the skin, 1 g of transexemic acid in 10 cc of normal saline was injected intra-articularly to help with postoperative bleeding. A sterile occlusive dressing was applied to the wound before the arm was placed into a shoulder  immobilizer with an abduction pillow. A Polar Care system also was applied to the shoulder. The patient was then transferred back to a hospital bed before being awakened, extubated, and returned to the recovery room in satisfactory condition after tolerating the procedure well.

## 2018-04-08 NOTE — Progress Notes (Signed)
PHARMACIST - PHYSICIAN ORDER COMMUNICATION  CONCERNING: P&T Medication Policy on Herbal Medications  DESCRIPTION:  This patient's order for:  glucosamine  has been noted.  This product(s) is classified as an "herbal" or natural product. Due to a lack of definitive safety studies or FDA approval, nonstandard manufacturing practices, plus the potential risk of unknown drug-drug interactions while on inpatient medications, the Pharmacy and Therapeutics Committee does not permit the use of "herbal" or natural products of this type within [place].   ACTION TAKEN: The pharmacy department is unable to verify this order at this time and your patient has been informed of this safety policy. Please reevaluate patient's clinical condition at discharge and address if the herbal or natural product(s) should be resumed at that time.   

## 2018-04-08 NOTE — Anesthesia Preprocedure Evaluation (Signed)
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:   Pulmonary neg pulmonary ROS, former smoker,           Cardiovascular Exercise Tolerance: Good hypertension, (-) angina+ CAD, + Cardiac Stents and + Peripheral Vascular Disease  (-) Past MI and (-) CABG (-) dysrhythmias (-) Valvular Problems/Murmurs     Neuro/Psych negative neurological ROS  negative psych ROS   GI/Hepatic negative GI ROS, Neg liver ROS,   Endo/Other  diabetes  Renal/GU negative Renal ROS  negative genitourinary   Musculoskeletal   Abdominal   Peds  Hematology negative hematology ROS (+)   Anesthesia Other Findings Past Medical History: No date: Coronary artery disease 1986: Cystitis 2001: Diabetes mellitus without complication ([place]) 9833: Heart disease 2003: Hypertension No date: PAD (peripheral artery disease) ([place]) 2016: Squamous cell skin cancer   Reproductive/Obstetrics negative OB ROS                             Anesthesia Physical Anesthesia Plan  ASA: III  Anesthesia Plan: General   Post-op Pain Management:  Regional for Post-op pain   Induction: Intravenous  PONV Risk Score and Plan: 3 and Ondansetron and Dexamethasone  Airway Management Planned: Oral ETT  Additional Equipment:   Intra-op Plan:   Post-operative Plan: Extubation in OR  Informed Consent: I have reviewed the patients History and Physical, chart, labs and discussed the  procedure including the risks, benefits and alternatives for the proposed anesthesia with the patient or authorized representative who has indicated his/her understanding and acceptance.   Dental Advisory Given  Plan Discussed with: Anesthesiologist, CRNA and Surgeon  Anesthesia Plan Comments:         Anesthesia Quick Evaluation

## 2018-04-08 NOTE — Anesthesia Procedure Notes (Signed)
Procedure Name: Intubation Date/Time: 04/08/2018 11:46 AM Performed by: Bernardo Heater, CRNA Pre-anesthesia Checklist: Patient identified, Emergency Drugs available, Suction available and Patient being monitored Patient Re-evaluated:Patient Re-evaluated prior to induction Oxygen Delivery Method: Circle system utilized Preoxygenation: Pre-oxygenation with 100% oxygen Induction Type: IV induction Laryngoscope Size: Mac and 3 Grade View: Grade I Tube size: 7.0 mm Number of attempts: 1 Placement Confirmation: ETT inserted through vocal cords under direct vision,  positive ETCO2 and breath sounds checked- equal and bilateral Secured at: 21 cm Tube secured with: Tape Dental Injury: Teeth and Oropharynx as per pre-operative assessment

## 2018-04-08 NOTE — Progress Notes (Signed)
Pt admitted to room 160 from PACU. Pt is A&OX4. As soon as pt was rolled into room, pt began vomiting-received zofran and reglan. Honeycomb dressing CDI to right shoulder; shoulder immobilizer and polar care in place. Pt and family updated on plan of care. Discussed nausea and hypertension with Dr. Roland Rack, orders received.   Blackburn, Christina Caras

## 2018-04-08 NOTE — H&P (Signed)
Paper H&P to be scanned into permanent record. H&P reviewed and patient re-examined. No changes. 

## 2018-04-08 NOTE — Transfer of Care (Signed)
Immediate Anesthesia Transfer of Care Note  Patient: [name]  Procedure(s) Performed: REVERSE SHOULDER ARTHROPLASTY (Right Shoulder)  Patient Location: PACU  Anesthesia Type:General  Level of Consciousness: awake, alert , oriented and patient cooperative  Airway & Oxygen Therapy: Patient Spontanous Breathing and Patient connected to nasal cannula oxygen  Post-op Assessment: Report given to RN and Post -op Vital signs reviewed and stable  Post vital signs: Reviewed and stable  Last Vitals:  Vitals Value Taken Time  BP 167/80 04/08/2018  2:39 PM  Temp    Pulse 70 04/08/2018  2:42 PM  Resp 15 04/08/2018  2:42 PM  SpO2 95 % 04/08/2018  2:42 PM  Vitals shown include unvalidated device data.  Last Pain:  Vitals:   04/08/18 1437  TempSrc:   PainSc: (P) 0-No pain         Complications: No apparent anesthesia complications

## 2018-04-08 NOTE — Anesthesia Procedure Notes (Signed)
Anesthesia Regional Block: Interscalene brachial plexus block   Pre-Anesthetic Checklist: ,, timeout performed, Correct Patient, Correct Site, Correct Laterality, Correct Procedure, Correct Position, site marked, Risks and benefits discussed,  Surgical consent,  Pre-op evaluation,  At surgeon's request and post-op pain management  Laterality: Right and Upper  Prep: chloraprep       Needles:  Injection technique: Single-shot  Needle Type: Stimiplex     Needle Length: 5cm  Needle Gauge: 22     Additional Needles:   Procedures:,,,, ultrasound used (permanent image in chart),,,,  Narrative:  Start time: 04/08/2018 11:01 AM End time: 04/08/2018 11:05 AM Injection made incrementally with aspirations every 5 mL.  Performed by: Personally  Anesthesiologist: Martha Clan, MD  Additional Notes: Functioning IV was confirmed and monitors were applied.  A 56mm 22ga Stimuplex needle was used. Sterile prep and drape,hand hygiene and sterile gloves were used.  Negative aspiration and negative test dose prior to incremental administration of local anesthetic. The patient tolerated the procedure well.

## 2018-04-08 NOTE — Anesthesia Post-op Follow-up Note (Signed)
Anesthesia QCDR form completed.        

## 2018-04-09 ENCOUNTER — Encounter: Payer: Self-pay | Admitting: Surgery

## 2018-04-09 LAB — BASIC METABOLIC PANEL
Anion gap: 7 (ref 5–15)
BUN: 17 mg/dL (ref 8–23)
CO2: 23 mmol/L (ref 22–32)
CREATININE: 0.62 mg/dL (ref 0.44–1.00)
Calcium: 7.6 mg/dL — ABNORMAL LOW (ref 8.9–10.3)
Chloride: 110 mmol/L (ref 98–111)
GFR calc Af Amer: >60 mL/min (ref >60)
GLUCOSE: 147 mg/dL — AB (ref 70–99)
POTASSIUM: 3.5 mmol/L (ref 3.5–5.1)
SODIUM: 140 mmol/L (ref 135–145)

## 2018-04-09 LAB — CBC WITH DIFFERENTIAL/PLATELET
Basophils Absolute: 0 10*3/uL (ref 0–0.1)
Basophils Relative: 0 %
EOS ABS: 0 10*3/uL (ref 0–0.7)
EOS PCT: 1 %
HCT: 31.7 % — ABNORMAL LOW (ref 35.0–47.0)
Hemoglobin: 10.9 g/dL — ABNORMAL LOW (ref 12.0–16.0)
LYMPHS ABS: 1.2 10*3/uL (ref 1.0–3.6)
LYMPHS PCT: 14 %
MCH: 29.3 pg (ref 26.0–34.0)
MCHC: 34.3 g/dL (ref 32.0–36.0)
MCV: 85.6 fL (ref 80.0–100.0)
MONO ABS: 0.7 10*3/uL (ref 0.2–0.9)
Monocytes Relative: 8 %
Neutro Abs: 6.3 10*3/uL (ref 1.4–6.5)
Neutrophils Relative %: 77 %
PLATELETS: 214 10*3/uL (ref 150–440)
RBC: 3.71 MIL/uL — AB (ref 3.80–5.20)
RDW: 15 % — ABNORMAL HIGH (ref 11.5–14.5)
WBC: 8.2 10*3/uL (ref 3.6–11.0)

## 2018-04-09 LAB — GLUCOSE, CAPILLARY
Glucose-Capillary: 132 mg/dL — ABNORMAL HIGH (ref 70–99)
Glucose-Capillary: 193 mg/dL — ABNORMAL HIGH (ref 70–99)

## 2018-04-09 MED ORDER — OXYCODONE HCL 5 MG PO TABS: 5.0000 mg | ORAL_TABLET | ORAL | 0 refills | Status: DC | PRN

## 2018-04-09 NOTE — Evaluation (Signed)
Occupational Therapy Evaluation Patient Details Name: Christina Blackburn MRN: [id number] DOB: 03/14/1946 Today's Date: 04/09/2018    History of Present Illness 72yo female POD#1 s/p R reverse TSA.    Clinical Impression   Patient was seen for an OT evaluation this date. Pt lives with her spouse in a 2 story home (does not need to use 2nd floor while recovering). Spouse will be able to assist as needed 24/7 and pt reports having other family nearby to help if needed. Prior to surgery, pt was active and independent. Pt has orders for RUE to be immobilized and will be NWBing per MD. Patient presents with impaired strength/ROM, functional UE use, and sensation to RUE with block not completely resolved yet. These impairments result in a decreased ability to perform self care tasks requiring min assist for UB/LB dressing and bathing and max assist for application of polar care, compression stockings, and sling/immobilizer. Pt instructed in polar care mgt, compression stockings mgt, sling/immobilizer mgt, ROM exercises for RUE, RUE precautions, adaptive strategies for bathing/dressing/toileting/grooming, positioning and considerations for sleep, and home/routines modifications to maximize falls prevention, safety, and independence. Handout provided. Pr verbalizes spouse will be able to provide needed level of assist. OT adjusted sling/immobilizer and polar care to improve comfort, optimize positioning, and to maximize skin integrity/safety. Pt verbalized understanding of all education/training provided. Pt will benefit from skilled OT services to address these limitations and improve independence in daily tasks while in the hospital. Recommend follow up therapy as arranged by the surgeon to maximize return to PLOF and minimize falls risk.       Follow Up Recommendations  Follow surgeon's recommendation for DC plan and follow-up therapies    Equipment Recommendations  None recommended by OT     Recommendations for Other Services       Precautions / Restrictions Precautions Precautions: Shoulder Shoulder Interventions: Shoulder sling/immobilizer;Shoulder abduction pillow;At all times;Off for dressing/bathing/exercises Restrictions Weight Bearing Restrictions: Yes RUE Weight Bearing: Non weight bearing      Mobility Bed Mobility Overal bed mobility: Modified Independent             General bed mobility comments: additional time/effort but safe and no assist required. Instructed in getting in/out of bed at home on L side to improve safety and independence  Transfers Overall transfer level: Modified independent Equipment used: None             General transfer comment: able to perform without assist, no LOB, good safety awareness and confidence    Balance Overall balance assessment: No apparent balance deficits (not formally assessed)                                         ADL either performed or assessed with clinical judgement   ADL Overall ADL's : Needs assistance/impaired Eating/Feeding: Sitting;Modified independent Eating/Feeding Details (indicate cue type and reason): using non-dom L hand Grooming: Standing;Modified independent Grooming Details (indicate cue type and reason): instructed in underarm grooming Upper Body Bathing: Sitting;Minimal assistance Upper Body Bathing Details (indicate cue type and reason): instructed in underarm bathing  Lower Body Bathing: Minimal assistance;With caregiver independent assisting;Sit to/from stand Lower Body Bathing Details (indicate cue type and reason): instructed in use of AE to improve safety and independence using non-dom L hand to perform LB bathing once cleared to shower; sponge bath until then; instructed to take seated shower for first time once  cleared to support safety and energy conservation Upper Body Dressing : Sitting;Minimal assistance Upper Body Dressing Details (indicate cue type  and reason): instructed in hemi techniques for UB dressing, difficulty performing 2:2 limited AROm of R elbow w/ nerve block, assist for donning and for buttons by OT this date Lower Body Dressing: Sit to/from stand;Minimal assistance Lower Body Dressing Details (indicate cue type and reason): instructed in AE for LB dressing to improve safety and independence; instructed in compression stocking mgt strategies  Toilet Transfer: Supervision/safety;Min guard;Ambulation;Regular Museum/gallery exhibitions officer and Hygiene: Modified independent Toileting - Clothing Manipulation Details (indicate cue type and reason): using non-dom L hand     Functional mobility during ADLs: Supervision/safety;Min guard General ADL Comments: pt verbalizes spouse will be able to provided as much as assist as is needed     Vision Patient Visual Report: No change from baseline       Perception     Praxis      Pertinent Vitals/Pain Pain Assessment: No/denies pain     Hand Dominance Right   Extremity/Trunk Assessment Upper Extremity Assessment Upper Extremity Assessment: RUE deficits/detail(LUE WFL) RUE Deficits / Details: impaired sensation 2:2 nerve block, full PROM elbow and AROM wrist and hand RUE: Unable to fully assess due to immobilization RUE Sensation: decreased proprioception;decreased light touch RUE Coordination: decreased gross motor;decreased fine motor   Lower Extremity Assessment Lower Extremity Assessment: Overall WFL for tasks assessed;Defer to PT evaluation   Cervical / Trunk Assessment Cervical / Trunk Assessment: Normal   Communication Communication Communication: No difficulties   Cognition Arousal/Alertness: Awake/alert Behavior During Therapy: WFL for tasks assessed/performed Overall Cognitive Status: Within Functional Limits for tasks assessed                                     General Comments  polar care and sling/immobilizer adjusted to  maximize positioning and skin protection    Exercises Other Exercises Other Exercises: Pt instructed in comprehensive shoulder discharge handout. Pt verbalized understanding. Encouraged pt to have family take pictures of the sling and polar care in place front/side/back for visual reference to improve safety and proper positioning at home   Shoulder Instructions      [place] expects to be discharged to:: Private residence Living Arrangements: Spouse/significant other Available Help at Discharge: Family;Available 24 hours/day Type of Home: House Home Access: Stairs to enter [employer] of Steps: 1   Home Layout: One level     Bathroom Shower/Tub: Occupational psychologist: Standard     Home Equipment: Shower seat - built Hotel manager: Reacher        Prior Functioning/Environment Level of Independence: Independent        Comments: Pt indep with mobility and ADL. No falls.         OT Problem List: Decreased strength;Decreased knowledge of use of DME or AE;Decreased range of motion;Decreased knowledge of precautions;Impaired UE functional use;Impaired sensation      OT Treatment/Interventions:      OT Goals(Current goals can be found in the care plan section) Acute Rehab OT Goals Patient Stated Goal: to rehab shoulder and return to PLOF OT Goal Formulation: With patient Time For Goal Achievement: 04/23/18 Potential to Achieve Goals: Good  OT Frequency:     Barriers to D/C:            Co-evaluation  AM-PAC PT "6 Clicks" Daily Activity     Outcome Measure Help from another person eating meals?: None Help from another person taking care of personal grooming?: None Help from another person toileting, which includes using toliet, bedpan, or urinal?: None Help from another person bathing (including washing, rinsing, drying)?: A Little Help from another person to put on and taking  off regular upper body clothing?: A Little Help from another person to put on and taking off regular lower body clothing?: A Little 6 Click Score: 21   End of Session    Activity Tolerance: Patient tolerated treatment well Patient left: in bed;with call bell/phone within reach;Other (comment)(polar care and sling/immobilizer in place, sitting EOB, with PT in room to evaluate)  OT Visit Diagnosis: Other abnormalities of gait and mobility (R26.89)                Time: 8138-8719 OT Time Calculation (min): 38 min Charges:  OT General Charges $OT Visit: 1 Visit OT Evaluation $OT Eval Low Complexity: 1 Low OT Treatments $Self Care/Home Management : 23-37 mins  Jeni Salles, MPH, MS, OTR/L ascom [phone] 04/09/18, 9:06 AM

## 2018-04-09 NOTE — Progress Notes (Addendum)
Physical Therapy Evaluation Patient Details Name: Christina Blackburn MRN: [id number] DOB: May 19, 1946 Today's Date: 04/09/2018   History of Present Illness  72 yo female POD#1 s/p R reverse TSA without reported post-op complications. PMH includes PAD, HTN, and hyperlipidemia.   Clinical Impression  Pt admitted with above diagnosis. Pt currently with functional limitations due to the deficits listed below (see PT Problem List).  Pt still has block in effect and has not recovered full motor or sensory function at this time in operative extremity. Pt is modified independent for bed mobility and transfers without an assistive device. She requires CGA for 220' of ambulation to rehab gym and around RN station. No LOB or instability noted with ambulation. Pt able to perform spontaneous head turns for conversation and scanning of environment. Pt is able to ascend/descend 4 stairs with L railing and alternating stepping pattern. Good stability and no safety concerns identified. She would benefit from [facility] PT. No additional DME needed at this time. Pt has met all PT goals for discharge and is safe to return home when medically appropriate.      Follow Up Recommendations Home health PT    Equipment Recommendations  None recommended by PT    Recommendations for Other Services       Precautions / Restrictions Precautions Precautions: Shoulder Shoulder Interventions: Shoulder sling/immobilizer;Shoulder abduction pillow;At all times;Off for dressing/bathing/exercises Restrictions Weight Bearing Restrictions: Yes RUE Weight Bearing: Non weight bearing      Mobility  Bed Mobility Overal bed mobility: Modified Independent             General bed mobility comments: additional time/effort but safe and no assist required. Instructed in getting in/out of bed at home on L side to improve safety and independence  Transfers Overall transfer level: Modified independent Equipment used: None              General transfer comment: able to perform without assist, no LOB, good safety awareness and confidence  Ambulation/Gait Ambulation/Gait assistance: Min guard Gait Distance (Feet): 220 Feet Assistive device: None   Gait velocity: WFL for limited community mobility   General Gait Details: Pt initially starts ambulation with cane in LUE but quickly progresses to no assistive device. She is steady during ambulation and is able to perform sponteneous head turns without lateral gait deviation. No signs of DOE with ambulation.   Stairs Stairs: Yes   Stair Management: One rail Left;Alternating pattern Number of Stairs: 4 General stair comments: Pt is able to ascend/descend 4 stairs with L railing and alternating stepping pattern. Good stability and no safety concerns identified  Wheelchair Mobility    Modified Rankin (Stroke Patients Only)       Balance Overall balance assessment: Independent                                           Pertinent Vitals/Pain Pain Assessment: No/denies pain(Block still in effect)    Home Living Family/patient expects to be discharged to:: Private residence Living Arrangements: Spouse/significant other Available Help at Discharge: Family;Available 24 hours/day Type of Home: House Home Access: Stairs to enter Entrance Stairs-Rails: Left(Can hold pole upon entrance) Entrance Stairs-Number of Steps: 1 Home Layout: One level Home Equipment: Shower seat - built in;Adaptive equipment;Walker - 2 wheels      Prior Function Level of Independence: Independent         Comments: Pt  indep with mobility and ADL and IADLs. No falls.      Hand Dominance   Dominant Hand: Right    Extremity/Trunk Assessment   Upper Extremity Assessment Upper Extremity Assessment: RUE deficits/detail RUE Deficits / Details: impaired sensation 2:2 nerve block, full PROM elbow and AROM wrist and hand. Pt is able to actively extend elbow. No  active biceps contraction or AROM of elbow. Weak forearm supination RUE: Unable to fully assess due to immobilization RUE Sensation: decreased proprioception;decreased light touch RUE Coordination: decreased gross motor;decreased fine motor    Lower Extremity Assessment Lower Extremity Assessment: Overall WFL for tasks assessed    Cervical / Trunk Assessment Cervical / Trunk Assessment: Normal  Communication   Communication: No difficulties  Cognition Arousal/Alertness: Awake/alert Behavior During Therapy: WFL for tasks assessed/performed Overall Cognitive Status: Within Functional Limits for tasks assessed                                        General Comments General comments (skin integrity, edema, etc.): polar care and sling/immobilizer adjusted to maximize positioning and skin protection    Exercises Shoulder Exercises Shoulder Flexion: PROM;Right;10 reps;Supine(To 90 degrees) Shoulder External Rotation: PROM;10 reps;Supine(To neutral) Elbow Flexion: PROM;Right;10 reps;Supine Elbow Extension: AROM;Right;10 reps;Supine Wrist Flexion: AROM;Right;10 reps Wrist Extension: AROM;Right;10 reps Digit Composite Flexion: Strengthening;Right;10 reps;Other (comment)(with squeeze ball) Other Exercises Other Exercises: Pt instructed in comprehensive shoulder discharge handout. Pt verbalized understanding. Encouraged pt to have family take pictures of the sling and polar care in place front/side/back for visual reference to improve safety and proper positioning at home Donning/doffing sling/immobilizer: Set-up Correct positioning of sling/immobilizer: Independent ROM for elbow, wrist and digits of operated UE: Independent Sling wearing schedule (on at all times/off for ADL's): Independent Positioning of UE while sleeping: Independent   Assessment/Plan    PT Assessment Patient needs continued PT services  PT Problem List Decreased strength;Decreased range of  motion;Decreased activity tolerance       PT Treatment Interventions DME instruction;Gait training;Stair training;Functional mobility training;Therapeutic activities;Therapeutic exercise;Balance training;Patient/family education    PT Goals (Current goals can be found in the Care Plan section)  Acute Rehab PT Goals Patient Stated Goal: to rehab shoulder and return to PLOF PT Goal Formulation: With patient Time For Goal Achievement: 04/23/18 Potential to Achieve Goals: Good    Frequency BID   Barriers to discharge        Co-evaluation               AM-PAC PT "6 Clicks" Daily Activity  Outcome Measure Difficulty turning over in bed (including adjusting bedclothes, sheets and blankets)?: A Little Difficulty moving from lying on back to sitting on the side of the bed? : A Lot Difficulty sitting down on and standing up from a chair with arms (e.g., wheelchair, bedside commode, etc,.)?: A Little Help needed moving to and from a bed to chair (including a wheelchair)?: None Help needed walking in hospital room?: None Help needed climbing 3-5 steps with a railing? : A Little 6 Click Score: 19    End of Session Equipment Utilized During Treatment: Gait belt Activity Tolerance: Patient tolerated treatment well Patient left: in chair;with call bell/phone within reach;with chair alarm set   PT Visit Diagnosis: Muscle weakness (generalized) (M62.81)    Time: 0964-3838 PT Time Calculation (min) (ACUTE ONLY): 26 min   Charges:   PT Evaluation $PT Eval Low Complexity: 1 Low  PT Treatments $Therapeutic Exercise: 8-22 mins   PT G Codes:        Lyndel Safe Huprich PT, DPT, GCS   Huprich,Jason 04/09/2018, 9:46 AM

## 2018-04-09 NOTE — Discharge Summary (Signed)
Physician Discharge Summary  Patient ID: Christina Blackburn MRN: [id number] DOB/AGE: 1945-11-27 72 y.o.  Admit date: 04/08/2018 Discharge date: 04/09/2018  Admission Diagnoses:  [place] ARTHROPATHY,RIGHT ROTATOR CUFF TENDINITIS  Discharge Diagnoses: Patient Active Problem List   Diagnosis Date Noted  . 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 PAD (peripheral artery disease) ([place]) 05/16/2017  . Diabetes ([place]) 05/16/2017  . Essential hypertension 05/16/2017  . Hyperlipidemia 05/16/2017  . 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    Past Medical History:  Diagnosis Date  . Coronary artery disease   . Cystitis 1986  . Diabetes mellitus without complication ([place]) 5462  . Heart disease 2008  . Hypertension 2003  . PAD (peripheral artery disease) ([place])   . Squamous cell skin cancer 2016    Transfusion: None.   Consultants (if any):   Discharged Condition: Improved  Hospital Course: Christina Blackburn is an 72 y.o. female who was admitted 04/08/2018 with a diagnosis of right rotator cuff arthropathy and went to the operating room on 04/08/2018 and underwent the above named procedures.    Surgeries: Procedure(s): REVERSE SHOULDER ARTHROPLASTY on 04/08/2018 Patient tolerated the surgery well. Taken to PACU where she was stabilized and then transferred to the orthopedic floor.  Started on Lovenox 40mg  q 24 hrs. Foot pumps applied bilaterally at 80 mm. Heels elevated on bed with rolled towels. No evidence of DVT. Negative [place]. Physical therapy started on day #1 for gait training and transfer. OT started day #1 for ADL and assisted devices.  Patient's IV was removed on POD1.  Implants: All press-fit Integra system with an 11 mm stem, a small metaphyseal body, a +0 mm (standard) humeral platform, a mini baseplate, and a 38 mm concentric +2 mm laterally offset glenosphere.  She was given perioperative antibiotics:  Anti-infectives (From  admission, onward)   Start     Dose/Rate Route Frequency Ordered Stop   04/08/18 1600  vancomycin (VANCOCIN) IVPB 1000 mg/200 mL premix     1,000 mg 200 mL/hr over 60 Minutes Intravenous Every 12 hours 04/08/18 1546 04/08/18 1825   04/08/18 0906  vancomycin (VANCOCIN) 1-5 GM/200ML-% IVPB    Note to Pharmacy:  Ronnell Freshwater   : cabinet override      04/08/18 0906 04/08/18 1207   04/08/18 0115  vancomycin (VANCOCIN) IVPB 1000 mg/200 mL premix     1,000 mg 200 mL/hr over 60 Minutes Intravenous  Once 04/08/18 0109 04/08/18 1301    .  She was given sequential compression devices, early ambulation, and lovenox for DVT prophylaxis.  She benefited maximally from the hospital stay and there were no complications.    Recent vital signs:  Vitals:   04/08/18 2318 04/09/18 0354  BP: 132/74 120/65  Pulse: 71 67  Resp:    Temp: 98.2 F (36.8 C) 98.1 F (36.7 C)  SpO2: 100% 99%    Recent laboratory studies:  Lab Results  Component Value Date   HGB 10.9 (L) 04/09/2018   HGB 13.8 04/04/2018   Lab Results  Component Value Date   WBC 8.2 04/09/2018   PLT 214 04/09/2018   Lab Results  Component Value Date   INR 0.95 04/04/2018   Lab Results  Component Value Date   NA 140 04/09/2018   K 3.5 04/09/2018   CL 110 04/09/2018   CO2 23 04/09/2018   BUN 17 04/09/2018   CREATININE 0.62 04/09/2018   GLUCOSE 147 (H) 04/09/2018    Discharge Medications:  Allergies as of 04/09/2018      Reactions   Tape Other (See Comments)   Hypoallergenic tape - blisters      Medication List    TAKE these medications   aspirin 81 MG tablet Take 81 mg by mouth daily.   atorvastatin 20 MG tablet Commonly known as:  LIPITOR Take 20 mg by mouth daily.   B COMPLEX 100 PO Take 1 capsule by mouth daily.   calcium carbonate 600 MG Tabs tablet Commonly known as:  OS-CAL Take 600 mg by mouth 2 (two) times daily with a meal.   clopidogrel 75 MG tablet Commonly known as:  PLAVIX Take 75 mg by  mouth daily.   fenofibrate 145 MG tablet Commonly known as:  TRICOR Take 145 mg by mouth daily.   fish oil-omega-3 fatty acids 1000 MG capsule Take 1 g by mouth daily.   glimepiride 4 MG tablet Commonly known as:  AMARYL Take 4 mg by mouth daily with breakfast.   GLUCOSAMINE CHOND COMPLEX/MSM PO Take 1 tablet by mouth daily.   losartan-hydrochlorothiazide 100-25 MG tablet Commonly known as:  HYZAAR Take 1 tablet by mouth daily.   MAG GLYCINATE PO Take 162 mg by mouth daily. *Cramping   magnesium oxide 400 MG tablet Commonly known as:  MAG-OX Take 400 mg by mouth daily.   metFORMIN 1000 MG tablet Commonly known as:  GLUCOPHAGE Take 1,000 mg by mouth daily with breakfast.   metoprolol succinate 100 MG 24 hr tablet Commonly known as:  TOPROL-XL Take 100 mg by mouth daily. Take with or immediately following a meal.   oxyCODONE 5 MG immediate release tablet Commonly known as:  Oxy IR/ROXICODONE Take 1-2 tablets (5-10 mg total) by mouth every 4 (four) hours as needed for moderate pain.   vitamin E 400 UNIT capsule Take 400 Units by mouth daily.       Diagnostic Studies: Ct Shoulder Right Wo Contrast  Result Date: 04/03/2018 CLINICAL DATA:  Chronic right shoulder pain. The patient is for right shoulder replacement. Preoperative planning exam. EXAM: CT OF THE UPPER RIGHT EXTREMITY WITHOUT CONTRAST TECHNIQUE: Multidetector CT imaging of the upper right extremity was performed according to the standard protocol. COMPARISON:  None. FINDINGS: Bones/Joint/Cartilage No acute bony abnormality is identified. The patient has advanced glenohumeral osteoarthritis with marked joint space narrowing and a large osteophyte off the humeral head. Small subchondral cysts are seen in the posterior rim of the glenoid. The humeral head is high-riding and abuts the undersurface of the acromion. The acromion is thinned and sclerotic consistent with chronic abutment. The acromion is type 2 with  subacromial spurring. Chondrocalcinosis is present about the glenohumeral joint. Loose bodies are seen in the joint measuring up to 1 cm in diameter. The patient also has advanced acromioclavicular degenerative disease. No fracture or worrisome lesion. Ligaments Suboptimally assessed by CT. Muscles and Tendons The supraspinatus and infraspinatus are completely torn and retracted to the glenoid, 4-5 cm. Mild to moderate atrophy of the muscle bellies is identified. Otherwise negative. Soft tissues Imaged intrathoracic contents demonstrate clear lungs. Atherosclerosis noted. IMPRESSION: Advanced glenohumeral osteoarthritis with extensive chondrocalcinosis about the joint and loose bodies present. Complete supraspinatus and infraspinatus tendon tears with associated chronic abutment of the humeral head and acromion which is sclerotic and thinned. The acromion is type 2 with subacromial spurring. Advanced acromioclavicular osteoarthritis. Electronically Signed   By: Inge Rise M.D.   On: 04/03/2018 10:13   Dg Shoulder Right Port  Result Date: 04/08/2018 CLINICAL  DATA:  S post reverse right shoulder joint replacement. EXAM: PORTABLE RIGHT SHOULDER COMPARISON:  None in PACs FINDINGS: The patient has undergone reverse right shoulder joint prosthesis placement. Radiographic positioning of the prosthetic components is good. Surgical skin staples are present. IMPRESSION: There is no immediate postprocedure complication following reverse right shoulder joint replacement. Electronically Signed   By: David  Martinique M.D.   On: 04/08/2018 16:56   Disposition: Plan will be for discharge home today following PT sessions today.  Follow-up Information    Lattie Corns, PA-C Follow up in 14 day(s).   Specialty:  Physician Assistant Why:  Electa Sniff information: [place] [address] [phone]          Signed: Judson Roch PA-C 04/09/2018, 7:48  AM

## 2018-04-09 NOTE — Progress Notes (Signed)
  Subjective: 1 Day Post-Op Procedure(s) (LRB): REVERSE SHOULDER ARTHROPLASTY (Right) Patient reports no pain to the right shoulder this AM, block still in effect.  Patient is well, and has had no acute complaints or problems Plan is to go Home after hospital stay. Negative for chest pain and shortness of breath Fever: no Gastrointestinal:Negative for nausea and vomiting  Objective: Vital signs in last 24 hours: Temp:  [96.1 F (35.6 C)-98.8 F (37.1 C)] 98.1 F (36.7 C) (06/26 0354) Pulse Rate:  [58-75] 67 (06/26 0354) Resp:  [14-26] 18 (06/25 1822) BP: (120-203)/(64-83) 120/65 (06/26 0354) SpO2:  [92 %-100 %] 99 % (06/26 0354) Weight:  [77.6 kg (171 lb)] 77.6 kg (171 lb) (06/25 0922)  Intake/Output from previous day:  Intake/Output Summary (Last 24 hours) at 04/09/2018 0742 Last data filed at 04/08/2018 1900 Gross per 24 hour  Intake 1905 ml  Output 253 ml  Net 1652 ml    Intake/Output this shift: No intake/output data recorded.  Labs: Recent Labs    04/09/18 0719  HGB 10.9*   Recent Labs    04/09/18 0719  WBC 8.2  RBC 3.71*  HCT 31.7*  PLT 214   No results for input(s): NA, K, CL, CO2, BUN, CREATININE, GLUCOSE, CALCIUM in the last 72 hours. No results for input(s): LABPT, INR in the last 72 hours.   EXAM General - Patient is Alert, Appropriate and Oriented Extremity - ABD soft Sensation intact distally Intact pulses distally Incision: dressing C/D/I Dressing/Incision - clean, dry, no drainage, Exparel block still in effect this AM, able to feel light touch in the axillary nerve to the right arm. Motor Function - intact, moving foot and toes well on exam. Able to flex and extend wrist without pain.  Past Medical History:  Diagnosis Date  . Coronary artery disease   . Cystitis 1986  . Diabetes mellitus without complication ([place]) 3335  . Heart disease 2008  . Hypertension 2003  . PAD (peripheral artery disease) ([place])   . Squamous cell skin cancer  2016    Assessment/Plan: 1 Day Post-Op Procedure(s) (LRB): REVERSE SHOULDER ARTHROPLASTY (Right) Active Problems:   Status post reverse total shoulder replacement, right  Estimated body mass index is 33.96 kg/m as calculated from the following:   Height as of this encounter: 4' 11.5" (1.511 m).   Weight as of this encounter: 77.6 kg (171 lb). Advance diet Up with therapy D/C IV fluids when tolerating po intake this AM.  Labs reviewed this morning. Up with therapy today. Nausea improved this AM. Plan will be for discharge home this afternoon pending progress with PT.  DVT Prophylaxis - Lovenox, Foot Pumps and TED hose Non-weightbearing to the right upper extremity.  Raquel Rayson Rando, PA-C [facility] Orthopaedic Surgery 04/09/2018, 7:42 AM

## 2018-04-09 NOTE — Progress Notes (Signed)
Chaplain responded to an OR for an AD. Pt was up finishing breakfast and alert preparing for therapy. Very nice person who is engaging. He is looking forward to going home to "babysit". Pt didn't not need a HCPOA due to husband and children being who she desire with husband primary. Chaplain offered prayer and Pt accepted. She hope for complete and quick healing.  Plan follow up to explore her desire for "babysitting"      04/09/18 0900  Clinical Encounter Type  Visited With Patient  Visit Type Initial  Referral From Nurse  Spiritual Encounters  Spiritual Needs Brochure;Prayer

## 2018-04-09 NOTE — Discharge Instructions (Signed)
Diet: As you were doing prior to hospitalization   Shower:  May shower but keep the wounds dry, use an occlusive plastic wrap, NO SOAKING IN TUB.  If the bandage gets wet, change with a clean dry gauze.  Dressing:  You may change your dressing as needed. Change the dressing with sterile gauze dressing.    Activity:  Increase activity slowly as tolerated, but follow the weight bearing instructions below.  No lifting or driving for 6 weeks.  Weight Bearing:   Non-weightbearing to the right upper extremity  Blood Clot Prevention: Continue Plavix and Aspirin.  To prevent constipation: you may use a stool softener such as -  Colace (over the counter) 100 mg by mouth twice a day  Drink plenty of fluids (prune juice may be helpful) and high fiber foods Miralax (over the counter) for constipation as needed.    Itching:  If you experience itching with your medications, try taking only a single pain pill, or even half a pain pill at a time.  You may take up to 10 pain pills per day, and you can also use benadryl over the counter for itching or also to help with sleep.   Precautions:  If you experience chest pain or shortness of breath - call 911 immediately for transfer to the hospital emergency department!!  If you develop a fever greater that 101 F, purulent drainage from wound, increased redness or drainage from wound, or calf pain-Call [place]                                              Follow- Up Appointment:  Please call for an appointment to be seen in 2 weeks at [facility]

## 2018-04-09 NOTE — Anesthesia Postprocedure Evaluation (Signed)
Anesthesia Post Note  Patient: Christina Blackburn  Procedure(s) Performed: REVERSE SHOULDER ARTHROPLASTY (Right Shoulder)  Patient location during evaluation: PACU Anesthesia Type: General Level of consciousness: awake and alert Pain management: pain level controlled Vital Signs Assessment: post-procedure vital signs reviewed and stable Respiratory status: spontaneous breathing, nonlabored ventilation, respiratory function stable and patient connected to nasal cannula oxygen Cardiovascular status: blood pressure returned to baseline and stable Postop Assessment: no apparent nausea or vomiting Anesthetic complications: no     Last Vitals:  Vitals:   04/09/18 1144 04/09/18 1200  BP: (!) 156/63 (!) 164/69  Pulse: 65 65  Resp:    Temp: 36.7 C 36.7 C  SpO2: 100% 99%    Last Pain:  Vitals:   04/09/18 1210  TempSrc:   PainSc: 1                  Martha Clan

## 2018-04-09 NOTE — Care Management Note (Signed)
Case Management Note  Patient Details  Name: Christina Blackburn MRN: [id number] Date of Birth: August 27, 1946  Subjective/Objective:  Met with patient at bedside. She lives with her spouse who will be assisting her. She is discharging today. Offered a list of home health agencies. Referral to [facility] for [place] and [place]. per DR. Poggi's orders. No DME needs.                   Action/Plan: [facility] for PT/OT  Expected Discharge Date:  04/09/18               Expected Discharge Plan:  [place]  In-House Referral:     Discharge planning Services  CM Consult  Post Acute Care Choice:  Home Health Choice offered to:  Patient  DME Arranged:    DME Agency:     HH Arranged:  OT, PT [place] Agency:  [facility] ([facility]  Status of Service:  Completed, signed off  If discussed at [employer], dates discussed:    Additional Comments:  Jolly Mango, RN 04/09/2018, 12:19 PM

## 2018-04-09 NOTE — Progress Notes (Signed)
Clinical Social Worker (CSW) received SNF consult. PT is recommending home health. RN case manager aware of above. Please reconsult if future social work needs arise. CSW signing off.   Safari Cinque, LCSW [phone] 

## 2018-04-09 NOTE — Progress Notes (Signed)
Pt ready for d/c home today per MD. Pt met PT/OT goals. Discharge instructions and prescriptions reviewed with pt and family, all questions answered. PIV removed. Pt assisted to car via NT.   [place], Jerry Caras

## 2018-04-10 LAB — SURGICAL PATHOLOGY

## 2018-04-11 DIAGNOSIS — Z4789 Encounter for other orthopedic aftercare: Secondary | ICD-10-CM | POA: Diagnosis not present

## 2018-04-11 DIAGNOSIS — Z96611 Presence of right artificial shoulder joint: Secondary | ICD-10-CM | POA: Diagnosis not present

## 2018-04-11 DIAGNOSIS — Z7984 Long term (current) use of oral hypoglycemic drugs: Secondary | ICD-10-CM | POA: Diagnosis not present

## 2018-04-11 DIAGNOSIS — E1151 Type 2 diabetes mellitus with diabetic peripheral angiopathy without gangrene: Secondary | ICD-10-CM | POA: Diagnosis not present

## 2018-04-11 DIAGNOSIS — I119 Hypertensive heart disease without heart failure: Secondary | ICD-10-CM | POA: Diagnosis not present

## 2018-04-11 DIAGNOSIS — I2510 Atherosclerotic heart disease of native coronary artery without angina pectoris: Secondary | ICD-10-CM | POA: Diagnosis not present

## 2018-04-11 DIAGNOSIS — C4492 Squamous cell carcinoma of skin, unspecified: Secondary | ICD-10-CM | POA: Diagnosis not present

## 2018-04-11 DIAGNOSIS — Z7902 Long term (current) use of antithrombotics/antiplatelets: Secondary | ICD-10-CM | POA: Diagnosis not present

## 2018-04-11 DIAGNOSIS — E785 Hyperlipidemia, unspecified: Secondary | ICD-10-CM | POA: Diagnosis not present

## 2018-04-11 DIAGNOSIS — K8020 Calculus of gallbladder without cholecystitis without obstruction: Secondary | ICD-10-CM | POA: Diagnosis not present

## 2018-04-11 DIAGNOSIS — F323 Major depressive disorder, single episode, severe with psychotic features: Secondary | ICD-10-CM | POA: Diagnosis not present

## 2018-04-11 DIAGNOSIS — G43909 Migraine, unspecified, not intractable, without status migrainosus: Secondary | ICD-10-CM | POA: Diagnosis not present

## 2018-04-11 DIAGNOSIS — Z7982 Long term (current) use of aspirin: Secondary | ICD-10-CM | POA: Diagnosis not present

## 2018-04-12 HISTORY — PX: JOINT REPLACEMENT: SHX530

## 2018-04-13 DIAGNOSIS — Z7984 Long term (current) use of oral hypoglycemic drugs: Secondary | ICD-10-CM | POA: Diagnosis not present

## 2018-04-13 DIAGNOSIS — I119 Hypertensive heart disease without heart failure: Secondary | ICD-10-CM | POA: Diagnosis not present

## 2018-04-13 DIAGNOSIS — Z7982 Long term (current) use of aspirin: Secondary | ICD-10-CM | POA: Diagnosis not present

## 2018-04-13 DIAGNOSIS — Z96611 Presence of right artificial shoulder joint: Secondary | ICD-10-CM | POA: Diagnosis not present

## 2018-04-13 DIAGNOSIS — C4492 Squamous cell carcinoma of skin, unspecified: Secondary | ICD-10-CM | POA: Diagnosis not present

## 2018-04-13 DIAGNOSIS — I2510 Atherosclerotic heart disease of native coronary artery without angina pectoris: Secondary | ICD-10-CM | POA: Diagnosis not present

## 2018-04-13 DIAGNOSIS — Z4789 Encounter for other orthopedic aftercare: Secondary | ICD-10-CM | POA: Diagnosis not present

## 2018-04-13 DIAGNOSIS — Z7902 Long term (current) use of antithrombotics/antiplatelets: Secondary | ICD-10-CM | POA: Diagnosis not present

## 2018-04-13 DIAGNOSIS — G43909 Migraine, unspecified, not intractable, without status migrainosus: Secondary | ICD-10-CM | POA: Diagnosis not present

## 2018-04-13 DIAGNOSIS — E785 Hyperlipidemia, unspecified: Secondary | ICD-10-CM | POA: Diagnosis not present

## 2018-04-13 DIAGNOSIS — F323 Major depressive disorder, single episode, severe with psychotic features: Secondary | ICD-10-CM | POA: Diagnosis not present

## 2018-04-13 DIAGNOSIS — E1151 Type 2 diabetes mellitus with diabetic peripheral angiopathy without gangrene: Secondary | ICD-10-CM | POA: Diagnosis not present

## 2018-04-13 DIAGNOSIS — K8020 Calculus of gallbladder without cholecystitis without obstruction: Secondary | ICD-10-CM | POA: Diagnosis not present

## 2018-04-14 ENCOUNTER — Other Ambulatory Visit: Payer: Self-pay

## 2018-04-14 NOTE — Patient Outreach (Signed)
[facility]) Care Management  04/14/2018  Christina Blackburn 1946-08-05 [id number]  72 year old female outreached by [place] services for 30 day post discharge medication review.  PMHx includes, but not limited to, peripheral artery disease, essential hypertension, diabetes, and hyperlipidemia.   Successful outreach to [name].  Patient declined [place] services for medication review post discharge.   Joetta Manners, PharmD Clinical Pharmacist [place] [phone]

## 2018-04-15 DIAGNOSIS — E1151 Type 2 diabetes mellitus with diabetic peripheral angiopathy without gangrene: Secondary | ICD-10-CM | POA: Diagnosis not present

## 2018-04-15 DIAGNOSIS — Z7982 Long term (current) use of aspirin: Secondary | ICD-10-CM | POA: Diagnosis not present

## 2018-04-15 DIAGNOSIS — I2510 Atherosclerotic heart disease of native coronary artery without angina pectoris: Secondary | ICD-10-CM | POA: Diagnosis not present

## 2018-04-15 DIAGNOSIS — C4492 Squamous cell carcinoma of skin, unspecified: Secondary | ICD-10-CM | POA: Diagnosis not present

## 2018-04-15 DIAGNOSIS — K8020 Calculus of gallbladder without cholecystitis without obstruction: Secondary | ICD-10-CM | POA: Diagnosis not present

## 2018-04-15 DIAGNOSIS — Z7984 Long term (current) use of oral hypoglycemic drugs: Secondary | ICD-10-CM | POA: Diagnosis not present

## 2018-04-15 DIAGNOSIS — Z96611 Presence of right artificial shoulder joint: Secondary | ICD-10-CM | POA: Diagnosis not present

## 2018-04-15 DIAGNOSIS — F323 Major depressive disorder, single episode, severe with psychotic features: Secondary | ICD-10-CM | POA: Diagnosis not present

## 2018-04-15 DIAGNOSIS — Z7902 Long term (current) use of antithrombotics/antiplatelets: Secondary | ICD-10-CM | POA: Diagnosis not present

## 2018-04-15 DIAGNOSIS — Z4789 Encounter for other orthopedic aftercare: Secondary | ICD-10-CM | POA: Diagnosis not present

## 2018-04-15 DIAGNOSIS — I119 Hypertensive heart disease without heart failure: Secondary | ICD-10-CM | POA: Diagnosis not present

## 2018-04-15 DIAGNOSIS — G43909 Migraine, unspecified, not intractable, without status migrainosus: Secondary | ICD-10-CM | POA: Diagnosis not present

## 2018-04-15 DIAGNOSIS — E785 Hyperlipidemia, unspecified: Secondary | ICD-10-CM | POA: Diagnosis not present

## 2018-04-21 DIAGNOSIS — I119 Hypertensive heart disease without heart failure: Secondary | ICD-10-CM | POA: Diagnosis not present

## 2018-04-21 DIAGNOSIS — Z96611 Presence of right artificial shoulder joint: Secondary | ICD-10-CM | POA: Diagnosis not present

## 2018-04-21 DIAGNOSIS — E1151 Type 2 diabetes mellitus with diabetic peripheral angiopathy without gangrene: Secondary | ICD-10-CM | POA: Diagnosis not present

## 2018-04-21 DIAGNOSIS — I2510 Atherosclerotic heart disease of native coronary artery without angina pectoris: Secondary | ICD-10-CM | POA: Diagnosis not present

## 2018-04-21 DIAGNOSIS — K8020 Calculus of gallbladder without cholecystitis without obstruction: Secondary | ICD-10-CM | POA: Diagnosis not present

## 2018-04-21 DIAGNOSIS — F323 Major depressive disorder, single episode, severe with psychotic features: Secondary | ICD-10-CM | POA: Diagnosis not present

## 2018-04-21 DIAGNOSIS — C4492 Squamous cell carcinoma of skin, unspecified: Secondary | ICD-10-CM | POA: Diagnosis not present

## 2018-04-21 DIAGNOSIS — E785 Hyperlipidemia, unspecified: Secondary | ICD-10-CM | POA: Diagnosis not present

## 2018-04-21 DIAGNOSIS — Z7982 Long term (current) use of aspirin: Secondary | ICD-10-CM | POA: Diagnosis not present

## 2018-04-21 DIAGNOSIS — Z4789 Encounter for other orthopedic aftercare: Secondary | ICD-10-CM | POA: Diagnosis not present

## 2018-04-21 DIAGNOSIS — Z7902 Long term (current) use of antithrombotics/antiplatelets: Secondary | ICD-10-CM | POA: Diagnosis not present

## 2018-04-21 DIAGNOSIS — G43909 Migraine, unspecified, not intractable, without status migrainosus: Secondary | ICD-10-CM | POA: Diagnosis not present

## 2018-04-21 DIAGNOSIS — Z7984 Long term (current) use of oral hypoglycemic drugs: Secondary | ICD-10-CM | POA: Diagnosis not present

## 2018-04-22 DIAGNOSIS — Z96611 Presence of right artificial shoulder joint: Secondary | ICD-10-CM | POA: Diagnosis not present

## 2018-04-22 DIAGNOSIS — E669 Obesity, unspecified: Secondary | ICD-10-CM | POA: Insufficient documentation

## 2018-04-22 DIAGNOSIS — M6281 Muscle weakness (generalized): Secondary | ICD-10-CM | POA: Diagnosis not present

## 2018-04-22 DIAGNOSIS — M25511 Pain in right shoulder: Secondary | ICD-10-CM | POA: Diagnosis not present

## 2018-04-22 DIAGNOSIS — M25611 Stiffness of right shoulder, not elsewhere classified: Secondary | ICD-10-CM | POA: Diagnosis not present

## 2018-04-29 DIAGNOSIS — M25511 Pain in right shoulder: Secondary | ICD-10-CM | POA: Diagnosis not present

## 2018-05-05 DIAGNOSIS — M25611 Stiffness of right shoulder, not elsewhere classified: Secondary | ICD-10-CM | POA: Diagnosis not present

## 2018-05-05 DIAGNOSIS — M6281 Muscle weakness (generalized): Secondary | ICD-10-CM | POA: Diagnosis not present

## 2018-05-05 DIAGNOSIS — M25511 Pain in right shoulder: Secondary | ICD-10-CM | POA: Diagnosis not present

## 2018-05-14 DIAGNOSIS — M25511 Pain in right shoulder: Secondary | ICD-10-CM | POA: Diagnosis not present

## 2018-05-14 DIAGNOSIS — Z96611 Presence of right artificial shoulder joint: Secondary | ICD-10-CM | POA: Diagnosis not present

## 2018-05-19 ENCOUNTER — Ambulatory Visit (INDEPENDENT_AMBULATORY_CARE_PROVIDER_SITE_OTHER): Payer: PPO

## 2018-05-19 ENCOUNTER — Ambulatory Visit (INDEPENDENT_AMBULATORY_CARE_PROVIDER_SITE_OTHER): Payer: PPO | Admitting: Vascular Surgery

## 2018-05-19 ENCOUNTER — Encounter (INDEPENDENT_AMBULATORY_CARE_PROVIDER_SITE_OTHER): Payer: Self-pay | Admitting: Vascular Surgery

## 2018-05-19 ENCOUNTER — Other Ambulatory Visit (INDEPENDENT_AMBULATORY_CARE_PROVIDER_SITE_OTHER): Payer: Self-pay | Admitting: Vascular Surgery

## 2018-05-19 VITALS — BP 180/85 | HR 68 | Resp 16 | Ht 59.75 in | Wt 177.0 lb

## 2018-05-19 DIAGNOSIS — E1151 Type 2 diabetes mellitus with diabetic peripheral angiopathy without gangrene: Secondary | ICD-10-CM | POA: Diagnosis not present

## 2018-05-19 DIAGNOSIS — M25511 Pain in right shoulder: Secondary | ICD-10-CM | POA: Diagnosis not present

## 2018-05-19 DIAGNOSIS — I739 Peripheral vascular disease, unspecified: Secondary | ICD-10-CM | POA: Diagnosis not present

## 2018-05-19 DIAGNOSIS — M25611 Stiffness of right shoulder, not elsewhere classified: Secondary | ICD-10-CM | POA: Diagnosis not present

## 2018-05-19 DIAGNOSIS — M6281 Muscle weakness (generalized): Secondary | ICD-10-CM | POA: Diagnosis not present

## 2018-05-19 DIAGNOSIS — I10 Essential (primary) hypertension: Secondary | ICD-10-CM | POA: Diagnosis not present

## 2018-05-19 DIAGNOSIS — E782 Mixed hyperlipidemia: Secondary | ICD-10-CM | POA: Diagnosis not present

## 2018-05-19 NOTE — Progress Notes (Signed)
MRN : [id number]  Christina Blackburn is a 72 y.o. (Jul 21, 1946) female who presents with chief complaint of No chief complaint on file. Marland Kitchen  History of Present Illness:   The patient returns to the office for followup and review of the noninvasive studies. There have been no interval changes in lower extremity symptoms. No interval shortening of the patient's claudication distance or development of rest pain symptoms. No new ulcers or wounds have occurred since the last visit.  There have been no significant changes to the patient's overall health care.  The patient denies amaurosis fugax or recent TIA symptoms. There are no recent neurological changes noted. The patient denies history of DVT, PE or superficial thrombophlebitis. The patient denies recent episodes of angina or shortness of breath.   ABI Rt=0.79  And Lt=0.56  No outpatient medications have been marked as taking for the 05/19/18 encounter (Appointment) with Delana Meyer, Dolores Lory, MD.    Past Medical History:  Diagnosis Date  . Coronary artery disease   . Cystitis 1986  . Diabetes mellitus without complication ([place]) 2119  . Heart disease 2008  . Hypertension 2003  . PAD (peripheral artery disease) ([place])   . Squamous cell skin cancer 2016    Past Surgical History:  Procedure Laterality Date  . ABDOMINAL HYSTERECTOMY  1989  . BREAST BIOPSY Left 2014   neg  . CARDIAC CATHETERIZATION     cornary stent  . CAROTID STENT  2008  . [place]  . CHOLECYSTECTOMY  1987  . REVERSE SHOULDER ARTHROPLASTY Right 04/08/2018   Procedure: REVERSE SHOULDER ARTHROPLASTY;  Surgeon: Corky Mull, MD;  Location: [facility] ORS;  Service: Orthopedics;  Laterality: Right;    Social History Social History   Tobacco Use  . Smoking status: Former Smoker    Years: 6.00    Types: Cigarettes    Last attempt to quit: 04/05/2007    Years since quitting: 11.1  . Smokeless tobacco: Never Used  Substance Use Topics  .  Alcohol use: Yes    Comment: rare  . Drug use: No    Family History Family History  Problem Relation Age of Onset  . Colon cancer Cousin 63  . Lymphoma Brother 30  . Breast cancer Neg Hx     Allergies  Allergen Reactions  . Tape Other (See Comments)    Hypoallergenic tape - blisters     REVIEW OF SYSTEMS (Negative unless checked)  Constitutional: [] Weight loss  [] Fever  [] Chills Cardiac: [] Chest pain   [] Chest pressure   [] Palpitations   [] Shortness of breath when laying flat   [] Shortness of breath with exertion. Vascular:  [x] Pain in legs with walking   [] Pain in legs at rest  [] History of DVT   [] Phlebitis   [] Swelling in legs   [] Varicose veins   [] Non-healing ulcers Pulmonary:   [] Uses home oxygen   [] Productive cough   [] Hemoptysis   [] Wheeze  [x] COPD   [] Asthma Neurologic:  [] Dizziness   [] Seizures   [] History of stroke   [] History of TIA  [] Aphasia   [] Vissual changes   [] Weakness or numbness in arm   [] Weakness or numbness in leg Musculoskeletal:   [] Joint swelling   [] Joint pain   [] Low back pain Hematologic:  [] Easy bruising  [] Easy bleeding   [] Hypercoagulable state   [] Anemic Gastrointestinal:  [] Diarrhea   [] Vomiting  [] Gastroesophageal reflux/heartburn   [] Difficulty swallowing. Genitourinary:  [] Chronic kidney disease   [] Difficult urination  [] Frequent urination   []   Blood in urine Skin:  [] Rashes   [] Ulcers  Psychological:  [] History of anxiety   []  History of major depression.  Physical Examination  There were no vitals filed for this visit. There is no height or weight on file to calculate BMI. Gen: WD/WN, NAD Head: [place]/AT, No temporalis wasting.  Ear/Nose/Throat: Hearing grossly intact, nares w/o erythema or drainage Eyes: PER, EOMI, sclera nonicteric.  Neck: Supple, no large masses.   Pulmonary:  Good air movement, no audible wheezing bilaterally, no use of accessory muscles.  Cardiac: RRR, no JVD Vascular:  Vessel Right Left  Radial Palpable  Palpable  PT Not Palpable Not Palpable  DP Not Palpable Not Palpable  Gastrointestinal: Non-distended. No guarding/no peritoneal signs.  Musculoskeletal: M/S 5/5 throughout.  No deformity or atrophy.  Neurologic: CN 2-12 intact. Symmetrical.  Speech is fluent. Motor exam as listed above. Psychiatric: Judgment intact, Mood & affect appropriate for pt's clinical situation. Dermatologic: No rashes or ulcers noted.  No changes consistent with cellulitis. Lymph : No lichenification or skin changes of chronic lymphedema.  CBC Lab Results  Component Value Date   WBC 8.2 04/09/2018   HGB 10.9 (L) 04/09/2018   HCT 31.7 (L) 04/09/2018   MCV 85.6 04/09/2018   PLT 214 04/09/2018    BMET    Component Value Date/Time   NA 140 04/09/2018 0719   K 3.5 04/09/2018 0719   CL 110 04/09/2018 0719   CO2 23 04/09/2018 0719   GLUCOSE 147 (H) 04/09/2018 0719   BUN 17 04/09/2018 0719   CREATININE 0.62 04/09/2018 0719   CALCIUM 7.6 (L) 04/09/2018 0719   GFRNONAA >60 04/09/2018 0719   GFRAA >60 04/09/2018 0719   CrCl cannot be calculated (Patient's most recent lab result is older than the maximum 21 days allowed.).  COAG Lab Results  Component Value Date   INR 0.95 04/04/2018    Radiology No results found.    Assessment/Plan 1. PAD (peripheral artery disease) (HCC) Recommend:  The patient has experienced increased symptoms and is now describing lifestyle limiting claudication and mild rest pain.    Given the severity of the patient's lower extremity symptoms the patient should undergo angiography and intervention.  Risk and benefits were reviewed the patient.  Indications for the procedure were reviewed.    Her husband is now being treated for Esophageal CA and this is a very stressful time.  She would like to follow up in a few months.  The patient should continue walking and begin a more formal exercise program.  The patient should continue antiplatelet therapy and aggressive  treatment of the lipid abnormalities  2. Essential hypertension Continue antihypertensive medications as already ordered, these medications have been reviewed and there are no changes at this time.   3. Type 2 diabetes mellitus with diabetic peripheral angiopathy without gangrene, without long-term current use of insulin (HCC) Continue hypoglycemic medications as already ordered, these medications have been reviewed and there are no changes at this time.  Hgb A1C to be monitored as already arranged by primary service   4. Mixed hyperlipidemia Continue statin as ordered and reviewed, no changes at this time    [name], MD  05/19/2018 9:40 AM

## 2018-05-21 DIAGNOSIS — M25511 Pain in right shoulder: Secondary | ICD-10-CM | POA: Diagnosis not present

## 2018-05-21 DIAGNOSIS — M25611 Stiffness of right shoulder, not elsewhere classified: Secondary | ICD-10-CM | POA: Diagnosis not present

## 2018-05-21 DIAGNOSIS — Z96611 Presence of right artificial shoulder joint: Secondary | ICD-10-CM | POA: Diagnosis not present

## 2018-05-21 DIAGNOSIS — M6281 Muscle weakness (generalized): Secondary | ICD-10-CM | POA: Diagnosis not present

## 2018-05-23 DIAGNOSIS — Z4789 Encounter for other orthopedic aftercare: Secondary | ICD-10-CM | POA: Diagnosis not present

## 2018-05-26 DIAGNOSIS — M6281 Muscle weakness (generalized): Secondary | ICD-10-CM | POA: Diagnosis not present

## 2018-05-26 DIAGNOSIS — M25511 Pain in right shoulder: Secondary | ICD-10-CM | POA: Diagnosis not present

## 2018-05-26 DIAGNOSIS — M25611 Stiffness of right shoulder, not elsewhere classified: Secondary | ICD-10-CM | POA: Diagnosis not present

## 2018-05-26 DIAGNOSIS — Z96611 Presence of right artificial shoulder joint: Secondary | ICD-10-CM | POA: Diagnosis not present

## 2018-05-28 DIAGNOSIS — Z96611 Presence of right artificial shoulder joint: Secondary | ICD-10-CM | POA: Diagnosis not present

## 2018-05-28 DIAGNOSIS — M25511 Pain in right shoulder: Secondary | ICD-10-CM | POA: Diagnosis not present

## 2018-05-28 DIAGNOSIS — M6281 Muscle weakness (generalized): Secondary | ICD-10-CM | POA: Diagnosis not present

## 2018-05-28 DIAGNOSIS — M25611 Stiffness of right shoulder, not elsewhere classified: Secondary | ICD-10-CM | POA: Diagnosis not present

## 2018-06-03 DIAGNOSIS — M25511 Pain in right shoulder: Secondary | ICD-10-CM | POA: Diagnosis not present

## 2018-06-03 DIAGNOSIS — M6281 Muscle weakness (generalized): Secondary | ICD-10-CM | POA: Diagnosis not present

## 2018-06-03 DIAGNOSIS — Z96611 Presence of right artificial shoulder joint: Secondary | ICD-10-CM | POA: Diagnosis not present

## 2018-06-03 DIAGNOSIS — M25611 Stiffness of right shoulder, not elsewhere classified: Secondary | ICD-10-CM | POA: Diagnosis not present

## 2018-06-05 DIAGNOSIS — M6281 Muscle weakness (generalized): Secondary | ICD-10-CM | POA: Diagnosis not present

## 2018-06-05 DIAGNOSIS — Z96611 Presence of right artificial shoulder joint: Secondary | ICD-10-CM | POA: Diagnosis not present

## 2018-06-05 DIAGNOSIS — M25511 Pain in right shoulder: Secondary | ICD-10-CM | POA: Diagnosis not present

## 2018-06-05 DIAGNOSIS — M25611 Stiffness of right shoulder, not elsewhere classified: Secondary | ICD-10-CM | POA: Diagnosis not present

## 2018-06-10 DIAGNOSIS — Z96611 Presence of right artificial shoulder joint: Secondary | ICD-10-CM | POA: Diagnosis not present

## 2018-06-10 DIAGNOSIS — M6281 Muscle weakness (generalized): Secondary | ICD-10-CM | POA: Diagnosis not present

## 2018-06-10 DIAGNOSIS — M25511 Pain in right shoulder: Secondary | ICD-10-CM | POA: Diagnosis not present

## 2018-06-10 DIAGNOSIS — M25611 Stiffness of right shoulder, not elsewhere classified: Secondary | ICD-10-CM | POA: Diagnosis not present

## 2018-06-12 DIAGNOSIS — M25611 Stiffness of right shoulder, not elsewhere classified: Secondary | ICD-10-CM | POA: Diagnosis not present

## 2018-06-12 DIAGNOSIS — M25511 Pain in right shoulder: Secondary | ICD-10-CM | POA: Diagnosis not present

## 2018-06-12 DIAGNOSIS — M6281 Muscle weakness (generalized): Secondary | ICD-10-CM | POA: Diagnosis not present

## 2018-06-12 DIAGNOSIS — Z96611 Presence of right artificial shoulder joint: Secondary | ICD-10-CM | POA: Diagnosis not present

## 2018-06-17 DIAGNOSIS — Z96611 Presence of right artificial shoulder joint: Secondary | ICD-10-CM | POA: Diagnosis not present

## 2018-06-17 DIAGNOSIS — M25511 Pain in right shoulder: Secondary | ICD-10-CM | POA: Diagnosis not present

## 2018-06-17 DIAGNOSIS — M6281 Muscle weakness (generalized): Secondary | ICD-10-CM | POA: Diagnosis not present

## 2018-06-17 DIAGNOSIS — M25611 Stiffness of right shoulder, not elsewhere classified: Secondary | ICD-10-CM | POA: Diagnosis not present

## 2018-06-19 DIAGNOSIS — M6281 Muscle weakness (generalized): Secondary | ICD-10-CM | POA: Diagnosis not present

## 2018-06-19 DIAGNOSIS — M25611 Stiffness of right shoulder, not elsewhere classified: Secondary | ICD-10-CM | POA: Diagnosis not present

## 2018-06-19 DIAGNOSIS — M25511 Pain in right shoulder: Secondary | ICD-10-CM | POA: Diagnosis not present

## 2018-06-19 DIAGNOSIS — Z96611 Presence of right artificial shoulder joint: Secondary | ICD-10-CM | POA: Diagnosis not present

## 2018-06-24 DIAGNOSIS — M6281 Muscle weakness (generalized): Secondary | ICD-10-CM | POA: Diagnosis not present

## 2018-06-24 DIAGNOSIS — M25511 Pain in right shoulder: Secondary | ICD-10-CM | POA: Diagnosis not present

## 2018-06-24 DIAGNOSIS — M25611 Stiffness of right shoulder, not elsewhere classified: Secondary | ICD-10-CM | POA: Diagnosis not present

## 2018-06-24 DIAGNOSIS — Z96611 Presence of right artificial shoulder joint: Secondary | ICD-10-CM | POA: Diagnosis not present

## 2018-06-26 DIAGNOSIS — Z96611 Presence of right artificial shoulder joint: Secondary | ICD-10-CM | POA: Diagnosis not present

## 2018-06-26 DIAGNOSIS — M25611 Stiffness of right shoulder, not elsewhere classified: Secondary | ICD-10-CM | POA: Diagnosis not present

## 2018-06-26 DIAGNOSIS — M25511 Pain in right shoulder: Secondary | ICD-10-CM | POA: Diagnosis not present

## 2018-06-26 DIAGNOSIS — M6281 Muscle weakness (generalized): Secondary | ICD-10-CM | POA: Diagnosis not present

## 2018-07-01 DIAGNOSIS — M25511 Pain in right shoulder: Secondary | ICD-10-CM | POA: Diagnosis not present

## 2018-07-01 DIAGNOSIS — Z96611 Presence of right artificial shoulder joint: Secondary | ICD-10-CM | POA: Diagnosis not present

## 2018-07-01 DIAGNOSIS — M25611 Stiffness of right shoulder, not elsewhere classified: Secondary | ICD-10-CM | POA: Diagnosis not present

## 2018-07-01 DIAGNOSIS — M6281 Muscle weakness (generalized): Secondary | ICD-10-CM | POA: Diagnosis not present

## 2018-07-02 DIAGNOSIS — Z86010 Personal history of colonic polyps: Secondary | ICD-10-CM | POA: Diagnosis not present

## 2018-07-02 DIAGNOSIS — K5909 Other constipation: Secondary | ICD-10-CM | POA: Diagnosis not present

## 2018-07-03 DIAGNOSIS — M6281 Muscle weakness (generalized): Secondary | ICD-10-CM | POA: Diagnosis not present

## 2018-07-03 DIAGNOSIS — M25611 Stiffness of right shoulder, not elsewhere classified: Secondary | ICD-10-CM | POA: Diagnosis not present

## 2018-07-03 DIAGNOSIS — M25511 Pain in right shoulder: Secondary | ICD-10-CM | POA: Diagnosis not present

## 2018-07-03 DIAGNOSIS — Z96611 Presence of right artificial shoulder joint: Secondary | ICD-10-CM | POA: Diagnosis not present

## 2018-07-08 DIAGNOSIS — M6281 Muscle weakness (generalized): Secondary | ICD-10-CM | POA: Diagnosis not present

## 2018-07-08 DIAGNOSIS — M25511 Pain in right shoulder: Secondary | ICD-10-CM | POA: Diagnosis not present

## 2018-07-08 DIAGNOSIS — M25611 Stiffness of right shoulder, not elsewhere classified: Secondary | ICD-10-CM | POA: Diagnosis not present

## 2018-07-08 DIAGNOSIS — Z96611 Presence of right artificial shoulder joint: Secondary | ICD-10-CM | POA: Diagnosis not present

## 2018-07-10 DIAGNOSIS — M6281 Muscle weakness (generalized): Secondary | ICD-10-CM | POA: Diagnosis not present

## 2018-07-10 DIAGNOSIS — M25611 Stiffness of right shoulder, not elsewhere classified: Secondary | ICD-10-CM | POA: Diagnosis not present

## 2018-07-10 DIAGNOSIS — Z96611 Presence of right artificial shoulder joint: Secondary | ICD-10-CM | POA: Diagnosis not present

## 2018-07-10 DIAGNOSIS — M25511 Pain in right shoulder: Secondary | ICD-10-CM | POA: Diagnosis not present

## 2018-07-14 ENCOUNTER — Ambulatory Visit (INDEPENDENT_AMBULATORY_CARE_PROVIDER_SITE_OTHER): Payer: PPO | Admitting: Vascular Surgery

## 2018-07-14 ENCOUNTER — Encounter (INDEPENDENT_AMBULATORY_CARE_PROVIDER_SITE_OTHER): Payer: Self-pay | Admitting: Vascular Surgery

## 2018-07-14 VITALS — BP 177/77 | HR 85 | Resp 16 | Ht 61.0 in | Wt 174.0 lb

## 2018-07-14 DIAGNOSIS — E119 Type 2 diabetes mellitus without complications: Secondary | ICD-10-CM | POA: Diagnosis not present

## 2018-07-14 DIAGNOSIS — E782 Mixed hyperlipidemia: Secondary | ICD-10-CM | POA: Diagnosis not present

## 2018-07-14 DIAGNOSIS — I10 Essential (primary) hypertension: Secondary | ICD-10-CM | POA: Diagnosis not present

## 2018-07-14 DIAGNOSIS — I70213 Atherosclerosis of native arteries of extremities with intermittent claudication, bilateral legs: Secondary | ICD-10-CM | POA: Diagnosis not present

## 2018-07-14 DIAGNOSIS — Z79899 Other long term (current) drug therapy: Secondary | ICD-10-CM | POA: Diagnosis not present

## 2018-07-14 DIAGNOSIS — E1151 Type 2 diabetes mellitus with diabetic peripheral angiopathy without gangrene: Secondary | ICD-10-CM

## 2018-07-14 DIAGNOSIS — E7800 Pure hypercholesterolemia, unspecified: Secondary | ICD-10-CM | POA: Diagnosis not present

## 2018-07-15 ENCOUNTER — Encounter (INDEPENDENT_AMBULATORY_CARE_PROVIDER_SITE_OTHER): Payer: Self-pay

## 2018-07-16 ENCOUNTER — Encounter (INDEPENDENT_AMBULATORY_CARE_PROVIDER_SITE_OTHER): Payer: Self-pay | Admitting: Vascular Surgery

## 2018-07-16 DIAGNOSIS — I70229 Atherosclerosis of native arteries of extremities with rest pain, unspecified extremity: Secondary | ICD-10-CM | POA: Insufficient documentation

## 2018-07-16 NOTE — Progress Notes (Signed)
MRN : [id number]  Christina Blackburn is a 72 y.o. (Feb 22, 1946) female who presents with chief complaint of  Chief Complaint  Patient presents with  . Follow-up    Discuss surgery  .  History of Present Illness:   The patient returns to the office for followup and review of the noninvasive studies. There has been a significant deterioration in the lower extremity symptoms.  The patient notes interval shortening of their claudication distance and development of mild rest pain symptoms mostly on the left. No new ulcers or wounds have occurred since the last visit.  There have been no significant changes to the patient's overall health care.  The patient denies amaurosis fugax or recent TIA symptoms. There are no recent neurological changes noted. The patient denies history of DVT, PE or superficial thrombophlebitis. The patient denies recent episodes of angina or shortness of breath.   ABI's Rt=[place] and Lt=[place] monophasic signals bilaterally  Current Meds  Medication Sig  . aspirin 81 MG tablet Take 81 mg by mouth daily.  Marland Kitchen atorvastatin (LIPITOR) 20 MG tablet Take 20 mg by mouth daily.   . B Complex Vitamins (B COMPLEX 100 PO) Take 1 capsule by mouth daily.  . calcium carbonate (OS-CAL) 600 MG TABS tablet Take 600 mg by mouth 2 (two) times daily with a meal.  . clopidogrel (PLAVIX) 75 MG tablet Take 75 mg by mouth daily.  . fenofibrate (TRICOR) 145 MG tablet Take 145 mg by mouth daily.  . fish oil-omega-3 fatty acids 1000 MG capsule Take 1 g by mouth daily.  Marland Kitchen glimepiride (AMARYL) 4 MG tablet Take 4 mg by mouth daily with breakfast.   . losartan-hydrochlorothiazide (HYZAAR) 100-25 MG tablet Take 1 tablet by mouth daily.   . magnesium oxide (MAG-OX) 400 MG tablet Take 400 mg by mouth daily.  . metFORMIN (GLUCOPHAGE) 1000 MG tablet Take 1,000 mg by mouth daily with breakfast.   . metoprolol succinate (TOPROL-XL) 100 MG 24 hr tablet Take 100 mg by mouth daily. Take with or immediately  following a meal.  . Misc Natural Products (GLUCOSAMINE CHOND COMPLEX/MSM PO) Take 1 tablet by mouth daily.  . vitamin E 400 UNIT capsule Take 400 Units by mouth daily.   . [DISCONTINUED] gabapentin (NEURONTIN) 300 MG capsule TAKE ONE CAPSULE BY MOUTH AT BEDTIME    Past Medical History:  Diagnosis Date  . Coronary artery disease   . Cystitis 1986  . Diabetes mellitus without complication ([place]) 9798  . Heart disease 2008  . Hypertension 2003  . PAD (peripheral artery disease) ([place])   . Squamous cell skin cancer 2016    Past Surgical History:  Procedure Laterality Date  . ABDOMINAL HYSTERECTOMY  1989  . BREAST BIOPSY Left 2014   neg  . CARDIAC CATHETERIZATION     cornary stent  . CAROTID STENT  2008  . [place]  . CHOLECYSTECTOMY  1987  . REVERSE SHOULDER ARTHROPLASTY Right 04/08/2018   Procedure: REVERSE SHOULDER ARTHROPLASTY;  Surgeon: Corky Mull, MD;  Location: [facility] ORS;  Service: Orthopedics;  Laterality: Right;    Social History Social History   Tobacco Use  . Smoking status: Former Smoker    Years: 6.00    Types: Cigarettes    Last attempt to quit: 04/05/2007    Years since quitting: 11.2  . Smokeless tobacco: Never Used  Substance Use Topics  . Alcohol use: Yes    Comment: rare  . Drug use: No  Family History Family History  Problem Relation Age of Onset  . Colon cancer Cousin 7  . Lymphoma Brother 58  . Breast cancer Neg Hx     Allergies  Allergen Reactions  . Tape Other (See Comments)    Hypoallergenic tape - blisters     REVIEW OF SYSTEMS (Negative unless checked)  Constitutional: [] Weight loss  [] Fever  [] Chills Cardiac: [] Chest pain   [] Chest pressure   [] Palpitations   [] Shortness of breath when laying flat   [] Shortness of breath with exertion. Vascular:  [x] Pain in legs with walking   [x] Pain in legs at rest  [] History of DVT   [] Phlebitis   [] Swelling in legs   [] Varicose veins   [] Non-healing ulcers Pulmonary:    [] Uses home oxygen   [] Productive cough   [] Hemoptysis   [] Wheeze  [] COPD   [] Asthma Neurologic:  [] Dizziness   [] Seizures   [] History of stroke   [] History of TIA  [] Aphasia   [] Vissual changes   [] Weakness or numbness in arm   [] Weakness or numbness in leg Musculoskeletal:   [] Joint swelling   [] Joint pain   [] Low back pain Hematologic:  [] Easy bruising  [] Easy bleeding   [] Hypercoagulable state   [] Anemic Gastrointestinal:  [] Diarrhea   [] Vomiting  [] Gastroesophageal reflux/heartburn   [] Difficulty swallowing. Genitourinary:  [] Chronic kidney disease   [] Difficult urination  [] Frequent urination   [] Blood in urine Skin:  [] Rashes   [] Ulcers  Psychological:  [] History of anxiety   []  History of major depression.  Physical Examination  Vitals:   07/14/18 1539  BP: (!) 177/77  Pulse: 85  Resp: 16  Weight: 174 lb (78.9 kg)  Height: 5\' 1"  (1.549 m)   Body mass index is 32.88 kg/m. Gen: WD/WN, NAD Head: [place]/AT, No temporalis wasting.  Ear/Nose/Throat: Hearing grossly intact, nares w/o erythema or drainage Eyes: PER, EOMI, sclera nonicteric.  Neck: Supple, no large masses.   Pulmonary:  Good air movement, no audible wheezing bilaterally, no use of accessory muscles.  Cardiac: RRR, no JVD Vascular:  Left leg sluggish cap refill Vessel Right Left  Radial Palpable Palpable  PT Not Palpable Not Palpable  DP Not Palpable Not Palpable  Gastrointestinal: Non-distended. No guarding/no peritoneal signs.  Musculoskeletal: M/S 5/5 throughout.  No deformity or atrophy.  Neurologic: CN 2-12 intact. Symmetrical.  Speech is fluent. Motor exam as listed above. Psychiatric: Judgment intact, Mood & affect appropriate for pt's clinical situation. Dermatologic: No rashes or ulcers noted.  No changes consistent with cellulitis. Lymph : No lichenification or skin changes of chronic lymphedema.  CBC Lab Results  Component Value Date   WBC 8.2 04/09/2018   HGB 10.9 (L) 04/09/2018   HCT 31.7 (L)  04/09/2018   MCV 85.6 04/09/2018   PLT 214 04/09/2018    BMET    Component Value Date/Time   NA 140 04/09/2018 0719   K 3.5 04/09/2018 0719   CL 110 04/09/2018 0719   CO2 23 04/09/2018 0719   GLUCOSE 147 (H) 04/09/2018 0719   BUN 17 04/09/2018 0719   CREATININE 0.62 04/09/2018 0719   CALCIUM 7.6 (L) 04/09/2018 0719   GFRNONAA >60 04/09/2018 0719   GFRAA >60 04/09/2018 0719   CrCl cannot be calculated (Patient's most recent lab result is older than the maximum 21 days allowed.).  COAG Lab Results  Component Value Date   INR 0.95 04/04/2018    Radiology No results found.   Assessment/Plan 1. Atherosclerosis of native artery of both lower extremities with  intermittent claudication (HCC) Recommend:  The patient has experienced increased symptoms and is now describing lifestyle limiting claudication and mild rest pain particularly of the left leg.   Given the severity of the patient's left lower extremity symptoms the patient should undergo angiography and intervention of the left leg.  Risk and benefits were reviewed the patient.  Indications for the procedure were reviewed.  All questions were answered, the patient agrees to proceed and request mid October.   The patient should continue walking and begin a more formal exercise program.  The patient should continue antiplatelet therapy and aggressive treatment of the lipid abnormalities  The patient will follow up with me after the angiogram.   2. Essential hypertension Continue antihypertensive medications as already ordered, these medications have been reviewed and there are no changes at this time.   3. Type 2 diabetes mellitus with diabetic peripheral angiopathy without gangrene, without long-term current use of insulin (HCC) Continue hypoglycemic medications as already ordered, these medications have been reviewed and there are no changes at this time.  Hgb A1C to be monitored as already arranged by primary  service   4. Mixed hyperlipidemia Continue statin as ordered and reviewed, no changes at this time     [name], MD  07/16/2018 12:00 PM

## 2018-07-21 DIAGNOSIS — I10 Essential (primary) hypertension: Secondary | ICD-10-CM | POA: Diagnosis not present

## 2018-07-21 DIAGNOSIS — Z0000 Encounter for general adult medical examination without abnormal findings: Secondary | ICD-10-CM | POA: Diagnosis not present

## 2018-07-21 DIAGNOSIS — Z1239 Encounter for other screening for malignant neoplasm of breast: Secondary | ICD-10-CM | POA: Diagnosis not present

## 2018-07-21 DIAGNOSIS — Z23 Encounter for immunization: Secondary | ICD-10-CM | POA: Diagnosis not present

## 2018-07-21 DIAGNOSIS — E1169 Type 2 diabetes mellitus with other specified complication: Secondary | ICD-10-CM | POA: Diagnosis not present

## 2018-07-21 DIAGNOSIS — I2510 Atherosclerotic heart disease of native coronary artery without angina pectoris: Secondary | ICD-10-CM | POA: Diagnosis not present

## 2018-07-21 DIAGNOSIS — E7800 Pure hypercholesterolemia, unspecified: Secondary | ICD-10-CM | POA: Diagnosis not present

## 2018-07-21 DIAGNOSIS — Z79899 Other long term (current) drug therapy: Secondary | ICD-10-CM | POA: Diagnosis not present

## 2018-07-30 ENCOUNTER — Other Ambulatory Visit (INDEPENDENT_AMBULATORY_CARE_PROVIDER_SITE_OTHER): Payer: Self-pay | Admitting: Vascular Surgery

## 2018-07-30 ENCOUNTER — Other Ambulatory Visit: Payer: Self-pay | Admitting: Internal Medicine

## 2018-07-30 DIAGNOSIS — Z1231 Encounter for screening mammogram for malignant neoplasm of breast: Secondary | ICD-10-CM

## 2018-08-04 ENCOUNTER — Other Ambulatory Visit: Payer: Self-pay

## 2018-08-04 ENCOUNTER — Encounter
Admission: RE | Admit: 2018-08-04 | Discharge: 2018-08-04 | Disposition: A | Discharge status: Home / Self Care | Payer: PPO | Source: Ambulatory Visit | Attending: Vascular Surgery | Admitting: Vascular Surgery

## 2018-08-04 DIAGNOSIS — Z9889 Other specified postprocedural states: Secondary | ICD-10-CM | POA: Diagnosis not present

## 2018-08-04 DIAGNOSIS — Z85820 Personal history of malignant melanoma of skin: Secondary | ICD-10-CM | POA: Diagnosis not present

## 2018-08-04 DIAGNOSIS — E782 Mixed hyperlipidemia: Secondary | ICD-10-CM | POA: Diagnosis not present

## 2018-08-04 DIAGNOSIS — I70213 Atherosclerosis of native arteries of extremities with intermittent claudication, bilateral legs: Secondary | ICD-10-CM | POA: Diagnosis not present

## 2018-08-04 DIAGNOSIS — Z90710 Acquired absence of both cervix and uterus: Secondary | ICD-10-CM | POA: Diagnosis not present

## 2018-08-04 DIAGNOSIS — Z888 Allergy status to other drugs, medicaments and biological substances status: Secondary | ICD-10-CM | POA: Diagnosis not present

## 2018-08-04 DIAGNOSIS — Z9049 Acquired absence of other specified parts of digestive tract: Secondary | ICD-10-CM | POA: Diagnosis not present

## 2018-08-04 DIAGNOSIS — Z955 Presence of coronary angioplasty implant and graft: Secondary | ICD-10-CM | POA: Diagnosis not present

## 2018-08-04 DIAGNOSIS — E1151 Type 2 diabetes mellitus with diabetic peripheral angiopathy without gangrene: Secondary | ICD-10-CM | POA: Diagnosis not present

## 2018-08-04 DIAGNOSIS — I10 Essential (primary) hypertension: Secondary | ICD-10-CM | POA: Diagnosis not present

## 2018-08-04 DIAGNOSIS — Z87891 Personal history of nicotine dependence: Secondary | ICD-10-CM | POA: Diagnosis not present

## 2018-08-04 HISTORY — DX: Other complications of anesthesia, initial encounter: T88.59XA

## 2018-08-04 HISTORY — DX: Other specified postprocedural states: Z98.890

## 2018-08-04 HISTORY — DX: Nausea with vomiting, unspecified: R11.2

## 2018-08-04 HISTORY — DX: Adverse effect of unspecified anesthetic, initial encounter: T41.45XA

## 2018-08-04 LAB — BUN: BUN: 19 mg/dL (ref 8–23)

## 2018-08-04 LAB — CREATININE, SERUM
Creatinine, Ser: 0.78 mg/dL (ref 0.44–1.00)
GFR calc Af Amer: >60 mL/min (ref >60)
GFR calc non Af Amer: >60 mL/min (ref >60)

## 2018-08-04 MED ORDER — CEFAZOLIN SODIUM-DEXTROSE 2-4 GM/100ML-% IV SOLN
2.0000 g | Freq: Once | INTRAVENOUS | Status: AC
  Administered 2018-08-05: 2 g via INTRAVENOUS

## 2018-08-04 NOTE — Patient Instructions (Signed)
FOLLOW INSTRUCTIONS GIVEN TO YOU BY [facility].  Your procedure is scheduled with Dr. Delana Meyer.                        On:  Tuesday, August 05, 2018    Go to '[facility]' on the first floor of the [facility].  Do not eat or drink 8 hours prior to your procedure.    Please call Dr Nino Parsley office with any questions or concerns: [phone].  You will need to have someone drive you home and stay with you the night of the procedure.

## 2018-08-05 ENCOUNTER — Encounter
Admission: RE | Disposition: A | Discharge status: Home / Self Care | Payer: Self-pay | Source: Ambulatory Visit | Attending: Vascular Surgery

## 2018-08-05 ENCOUNTER — Ambulatory Visit
Admission: RE | Admit: 2018-08-05 | Discharge: 2018-08-05 | Disposition: A | Discharge status: Home / Self Care | Payer: PPO | Source: Ambulatory Visit | Attending: Vascular Surgery | Admitting: Vascular Surgery

## 2018-08-05 ENCOUNTER — Other Ambulatory Visit: Payer: Self-pay

## 2018-08-05 DIAGNOSIS — Z85820 Personal history of malignant melanoma of skin: Secondary | ICD-10-CM | POA: Insufficient documentation

## 2018-08-05 DIAGNOSIS — I10 Essential (primary) hypertension: Secondary | ICD-10-CM | POA: Insufficient documentation

## 2018-08-05 DIAGNOSIS — Z888 Allergy status to other drugs, medicaments and biological substances status: Secondary | ICD-10-CM | POA: Insufficient documentation

## 2018-08-05 DIAGNOSIS — E782 Mixed hyperlipidemia: Secondary | ICD-10-CM | POA: Insufficient documentation

## 2018-08-05 DIAGNOSIS — I739 Peripheral vascular disease, unspecified: Secondary | ICD-10-CM

## 2018-08-05 DIAGNOSIS — Z90710 Acquired absence of both cervix and uterus: Secondary | ICD-10-CM | POA: Insufficient documentation

## 2018-08-05 DIAGNOSIS — E1151 Type 2 diabetes mellitus with diabetic peripheral angiopathy without gangrene: Secondary | ICD-10-CM | POA: Insufficient documentation

## 2018-08-05 DIAGNOSIS — I70222 Atherosclerosis of native arteries of extremities with rest pain, left leg: Secondary | ICD-10-CM | POA: Diagnosis not present

## 2018-08-05 DIAGNOSIS — I70201 Unspecified atherosclerosis of native arteries of extremities, right leg: Secondary | ICD-10-CM | POA: Diagnosis not present

## 2018-08-05 DIAGNOSIS — Z955 Presence of coronary angioplasty implant and graft: Secondary | ICD-10-CM | POA: Insufficient documentation

## 2018-08-05 DIAGNOSIS — Z9049 Acquired absence of other specified parts of digestive tract: Secondary | ICD-10-CM | POA: Insufficient documentation

## 2018-08-05 DIAGNOSIS — Z87891 Personal history of nicotine dependence: Secondary | ICD-10-CM | POA: Insufficient documentation

## 2018-08-05 DIAGNOSIS — I70213 Atherosclerosis of native arteries of extremities with intermittent claudication, bilateral legs: Secondary | ICD-10-CM | POA: Insufficient documentation

## 2018-08-05 DIAGNOSIS — Z9889 Other specified postprocedural states: Secondary | ICD-10-CM | POA: Insufficient documentation

## 2018-08-05 HISTORY — PX: LOWER EXTREMITY ANGIOGRAPHY: CATH118251

## 2018-08-05 LAB — GLUCOSE, CAPILLARY
GLUCOSE-CAPILLARY: 129 mg/dL — AB (ref 70–99)
Glucose-Capillary: 145 mg/dL — ABNORMAL HIGH (ref 70–99)

## 2018-08-05 SURGERY — LOWER EXTREMITY ANGIOGRAPHY
Anesthesia: Moderate Sedation | Laterality: Left

## 2018-08-05 MED ORDER — MIDAZOLAM HCL 2 MG/2ML IJ SOLN
INTRAMUSCULAR | Status: DC | PRN
  Administered 2018-08-05 (×2): 1 mg via INTRAVENOUS
  Administered 2018-08-05: 2 mg via INTRAVENOUS

## 2018-08-05 MED ORDER — SODIUM CHLORIDE 0.9 % IV SOLN: INTRAVENOUS | Status: DC

## 2018-08-05 MED ORDER — METHYLPREDNISOLONE SODIUM SUCC 125 MG IJ SOLR: 125.0000 mg | INTRAMUSCULAR | Status: DC | PRN

## 2018-08-05 MED ORDER — SODIUM CHLORIDE 0.9 % IV SOLN
INTRAVENOUS | Status: DC
  Administered 2018-08-05: 13:00:00 via INTRAVENOUS

## 2018-08-05 MED ORDER — FAMOTIDINE 20 MG PO TABS: 40.0000 mg | ORAL_TABLET | ORAL | Status: DC | PRN

## 2018-08-05 MED ORDER — ONDANSETRON HCL 4 MG/2ML IJ SOLN: 4.0000 mg | Freq: Four times a day (QID) | INTRAMUSCULAR | Status: DC | PRN

## 2018-08-05 MED ORDER — HEPARIN SODIUM (PORCINE) 1000 UNIT/ML IJ SOLN
INTRAMUSCULAR | Status: AC
  Filled 2018-08-05: qty 1

## 2018-08-05 MED ORDER — FENTANYL CITRATE (PF) 100 MCG/2ML IJ SOLN
INTRAMUSCULAR | Status: DC | PRN
  Administered 2018-08-05: 50 ug via INTRAVENOUS
  Administered 2018-08-05 (×3): 25 ug via INTRAVENOUS

## 2018-08-05 MED ORDER — MORPHINE SULFATE (PF) 4 MG/ML IV SOLN: 2.0000 mg | INTRAVENOUS | Status: DC | PRN

## 2018-08-05 MED ORDER — FENTANYL CITRATE (PF) 100 MCG/2ML IJ SOLN
INTRAMUSCULAR | Status: AC
  Filled 2018-08-05: qty 2

## 2018-08-05 MED ORDER — HYDROMORPHONE HCL 1 MG/ML IJ SOLN: 1.0000 mg | Freq: Once | INTRAMUSCULAR | Status: DC | PRN

## 2018-08-05 MED ORDER — MIDAZOLAM HCL 5 MG/5ML IJ SOLN
INTRAMUSCULAR | Status: AC
  Filled 2018-08-05: qty 5

## 2018-08-05 SURGICAL SUPPLY — 14 items
CATH BEACON 5 .035 65 RIM TIP (CATHETERS) ×3 IMPLANT
CATH PIG 70CM (CATHETERS) ×3 IMPLANT
DEVICE PRESTO INFLATION (MISCELLANEOUS) ×3 IMPLANT
DEVICE STARCLOSE SE CLOSURE (Vascular Products) ×3 IMPLANT
DEVICE TORQUE .025-.038 (MISCELLANEOUS) ×3 IMPLANT
GLIDECATH 4FR STR (CATHETERS) ×3 IMPLANT
NEEDLE ENTRY 21GA 7CM ECHOTIP (NEEDLE) ×3 IMPLANT
PACK ANGIOGRAPHY (CUSTOM PROCEDURE TRAY) ×3 IMPLANT
SET INTRO CAPELLA COAXIAL (SET/KITS/TRAYS/PACK) ×3 IMPLANT
SHEATH BRITE TIP 5FRX11 (SHEATH) ×3 IMPLANT
SHIELD RADPAD SCOOP 12X17 (MISCELLANEOUS) ×3 IMPLANT
TUBING CONTRAST HIGH PRESS 72 (TUBING) ×3 IMPLANT
WIRE AQUATRACK .035X260CM (WIRE) ×3 IMPLANT
WIRE J 3MM .035X145CM (WIRE) ×3 IMPLANT

## 2018-08-05 NOTE — H&P (Signed)
[facility] History & Physical Update  The patient was interviewed and re-examined.  The patient's previous History and Physical has been reviewed and is unchanged.  There is no change in the plan of care. We plan to proceed with the scheduled procedure.  [name], MD  08/05/2018, 1:38 PM

## 2018-08-05 NOTE — Discharge Instructions (Signed)

## 2018-08-05 NOTE — Op Note (Signed)
[facility]  Percutaneous Study/Intervention Procedural Note   Date of Surgery: 08/05/2018,11:23 PM  Surgeon:Citlalli Weikel, Dolores Lory   Pre-operative Diagnosis: Atherosclerotic occlusive disease bilateral lower extremities with lifestyle limiting claudication and rest pain of the left lower extremity  Post-operative diagnosis:  Same  Procedure(s) Performed:  1.  Abdominal aortogram  2.  Left lower extremity distal runoff  3.  Start closed right common femoral    Anesthesia: Conscious sedation was administered by the interventional radiology RN under my direct supervision. IV Versed plus fentanyl were utilized. Continuous ECG, pulse oximetry and blood pressure was monitored throughout the entire procedure.  Conscious sedation was administered for a total of 88 minutes.  Sheath: 5 French sheath right common femoral retrograde  Contrast: 120 cc   Fluoroscopy Time: 8.6 minutes  Indications:  The patient presents to [facility] with rest pain of the left lower extremity.  Pedal pulses are nonpalpable bilaterally suggesting atherosclerotic occlusive disease.  The risks and benefits as well as alternative therapies for lower extremity revascularization are reviewed with the patient all questions are answered the patient agrees to proceed.  The patient is therefore undergoing angiography with the hope for intervention for limb salvage.   Procedure:  Matisha Termine Varinoskiis a 72 y.o. female who was identified and appropriate procedural time out was performed.  The patient was then placed supine on the table and prepped and draped in the usual sterile fashion.  Ultrasound was used to evaluate the right common femoral artery.  It was echolucent and pulsatile indicating it is patent .  An ultrasound image was acquired for the permanent record.  A micropuncture needle was used to access the right common femoral artery under direct ultrasound guidance.  The microwire was then  advanced under fluoroscopic guidance without difficulty followed by the micro-sheath.  A 0.035 J wire was advanced without resistance and a 5Fr sheath was placed.    Pigtail catheter was then advanced to the level of T12 and AP projection of the aorta was obtained. Pigtail catheter was then repositioned to above the bifurcation and LAO and RAO view of the pelvis was obtained. Stiff angled Glidewire and rim catheter was then used across the bifurcation and the catheter was positioned in the distal external iliac artery.  LAO of the left groin was then obtained. Wire was reintroduced and negotiated into the common femoral and the catheter was advanced into the common femoral distal runoff was then performed.  After review of the images the catheter was removed over wire and an RAO view of the groin was obtained. StarClose device was deployed without difficulty.   Findings:   Aortogram: The abdominal aorta is opacified with a bolus injection contrast.  There is diffuse extensive disease noted throughout the aorta with multiple shelflike plaques but until the distal aorta is reached hemodynamically significant stenosis is not identified.  At the distal aorta and extending into the origins of the common iliac arteries bilaterally there is greater than 90% stenosis on the right and greater than 70% stenosis on the left.  There is a single renal artery on the right with normal nephrogram there does not appear to be heme dynamically significant stenosis.  On the left there is a single renal artery with a normal nephrogram there is clearly extensive calcific plaque however the SMA does cross over the renal artery and it is difficult to assess whether the left renal artery is greater than 70%.  The right external iliac artery appears  to have a greater than 90% focal napkin ringlike stenosis in the LAO projection.  I am unable to re-create this in the AP or RAO projection.  Also in the LAO projection a greater than 70%  stenosis at the origin of the left external iliac artery is identified.  The distal external iliac arteries are widely patent.  Right Lower Extremity: The right common femoral demonstrates greater than 80% stenosis in its distal aspect that extends into the SFA origin and the profunda femoris origin.  The SFA itself demonstrates multilevel disease throughout its course with several areas of short segment occlusion this is true of the popliteal as well.  The trifurcation is heavily diseased with occlusion of all 3 tibial vessels anterior tibial reconstitutes and appears patent down to the ankle filling the pedal arch posterior tibial and peroneal remain occluded throughout the majority of the course  Left Lower Extremity: The left common femoral demonstrates a focal central occlusion there is extensive disease at the origins of the profunda femoris and SFA.  Once again the SFA demonstrates extensive disease with several focal occlusions and multilevel greater than 70% stenosis as is true of the popliteal.  Trifurcation is similar to the right extensively diseased there is occlusion of the origin of the anterior tibial as well as the tibioperoneal trunk.  Anterior tibial appears to be patent once it is reconstituted proximally down to the foot filling the pedal arch.  Posterior tibial and peroneal remain occluded throughout their course.  Given the multilevel nature of her disease as well as the profound calcification involved both at the aortic bifurcation within the external iliacs and most importantly within the common femoral arteries bilaterally I believe a CT scan is indicated to better assess and plan for reconstruction.  Given the common femoral involvement hybrid procedure with open endarterectomy is likely the best solution and then intervention can be performed to reconstruct the distal aorta and external iliac arteries avoiding the need for an aortobifemoral bypass.   Disposition: Patient was taken  to the recovery room in stable condition having tolerated the procedure well.  Belenda Cruise Marenda Accardi 08/05/2018,11:23 PM

## 2018-08-06 ENCOUNTER — Encounter: Payer: Self-pay | Admitting: Vascular Surgery

## 2018-08-12 ENCOUNTER — Ambulatory Visit
Admission: RE | Admit: 2018-08-12 | Discharge: 2018-08-12 | Disposition: A | Discharge status: Home / Self Care | Payer: PPO | Source: Ambulatory Visit | Attending: Internal Medicine | Admitting: Internal Medicine

## 2018-08-12 DIAGNOSIS — Z1231 Encounter for screening mammogram for malignant neoplasm of breast: Secondary | ICD-10-CM | POA: Insufficient documentation

## 2018-08-14 ENCOUNTER — Ambulatory Visit (INDEPENDENT_AMBULATORY_CARE_PROVIDER_SITE_OTHER): Payer: PPO | Admitting: Vascular Surgery

## 2018-08-14 ENCOUNTER — Encounter (INDEPENDENT_AMBULATORY_CARE_PROVIDER_SITE_OTHER): Payer: Self-pay | Admitting: Vascular Surgery

## 2018-08-14 VITALS — BP 146/79 | HR 87 | Resp 16 | Ht 59.0 in | Wt 178.0 lb

## 2018-08-14 DIAGNOSIS — I739 Peripheral vascular disease, unspecified: Secondary | ICD-10-CM | POA: Diagnosis not present

## 2018-08-14 DIAGNOSIS — Z87891 Personal history of nicotine dependence: Secondary | ICD-10-CM

## 2018-08-14 NOTE — Progress Notes (Signed)
MRN : [id number]  Christina Blackburn is a 72 y.o. (1946-05-26) female who presents with chief complaint of  Chief Complaint  Patient presents with  . Follow-up    [facility] 1week follow up  .  History of Present Illness:   The patient returns to the office for followup and review status post angiogram with intervention. The patient notes continued pain in the lower extremities. + rest pain symptoms.  No new ulcers or wounds have occurred since the last visit.  There have been no significant changes to the patient's overall health care.  The patient denies amaurosis fugax or recent TIA symptoms. There are no recent neurological changes noted. The patient denies history of DVT, PE or superficial thrombophlebitis. The patient denies recent episodes of angina or shortness of breath.     Current Meds  Medication Sig  . amLODipine (NORVASC) 5 MG tablet Take 5 mg by mouth daily.  Marland Kitchen aspirin 81 MG tablet Take 81 mg by mouth daily.  Marland Kitchen atorvastatin (LIPITOR) 20 MG tablet Take 20 mg by mouth daily.   . calcium carbonate (OS-CAL) 600 MG TABS tablet Take 600 mg by mouth 2 (two) times daily with a meal.  . clopidogrel (PLAVIX) 75 MG tablet Take 75 mg by mouth daily.  . fenofibrate (TRICOR) 145 MG tablet Take 145 mg by mouth daily.  . fish oil-omega-3 fatty acids 1000 MG capsule Take 1 g by mouth daily.  Marland Kitchen glimepiride (AMARYL) 4 MG tablet Take 4 mg by mouth daily with breakfast.   . losartan-hydrochlorothiazide (HYZAAR) 100-25 MG tablet Take 1 tablet by mouth daily.   . magnesium oxide (MAG-OX) 400 MG tablet Take 400 mg by mouth daily.  . metFORMIN (GLUCOPHAGE) 1000 MG tablet Take 1,000 mg by mouth daily with breakfast.   . metoprolol succinate (TOPROL-XL) 100 MG 24 hr tablet Take 100 mg by mouth daily. Take with or immediately following a meal.  . OVER THE COUNTER MEDICATION Take 2 tablets by mouth at bedtime. Mag R&R Supplement  . traZODone (DESYREL) 50 MG tablet Take 50 mg by mouth at  bedtime.  . vitamin B-12 (CYANOCOBALAMIN) 1000 MCG tablet Take 1,000 mcg by mouth daily.  . vitamin E 400 UNIT capsule Take 400 Units by mouth daily.     Past Medical History:  Diagnosis Date  . Complication of anesthesia   . Coronary artery disease   . Cystitis 1986  . Diabetes mellitus without complication ([place]) 9233  . Heart disease 2008  . Hypertension 2003  . PAD (peripheral artery disease) ([place])   . PONV (postoperative nausea and vomiting)   . Squamous cell skin cancer 2016    Past Surgical History:  Procedure Laterality Date  . ABDOMINAL HYSTERECTOMY  1989  . BREAST BIOPSY Left 2014   neg  . CARDIAC CATHETERIZATION     cornary stent  . [place]  . CHOLECYSTECTOMY  1987  . CORONARY ANGIOPLASTY  2008   stent x1  . LOWER EXTREMITY ANGIOGRAPHY Left 08/05/2018   Procedure: LOWER EXTREMITY ANGIOGRAPHY;  Surgeon: Katha Cabal, MD;  Location: [facility];  Service: Cardiovascular;  Laterality: Left;  . REVERSE SHOULDER ARTHROPLASTY Right 04/08/2018   Procedure: REVERSE SHOULDER ARTHROPLASTY;  Surgeon: Corky Mull, MD;  Location: [facility] ORS;  Service: Orthopedics;  Laterality: Right;    Social History Social History   Tobacco Use  . Smoking status: Former Smoker    Years: 6.00    Types: Cigarettes  Last attempt to quit: 04/05/2007    Years since quitting: 11.3  . Smokeless tobacco: Never Used  Substance Use Topics  . Alcohol use: Yes    Comment: rare  . Drug use: No    Family History Family History  Problem Relation Age of Onset  . Colon cancer Cousin 52  . Lymphoma Brother 78  . Lung cancer Mother   . Hypertension Father   . Kidney failure Father   . Breast cancer Neg Hx     Allergies  Allergen Reactions  . Tape Other (See Comments)    Hypoallergenic tape - blisters     REVIEW OF SYSTEMS (Negative unless checked)  Constitutional: [] Weight loss  [] Fever  [] Chills Cardiac: [] Chest pain   [] Chest pressure    [] Palpitations   [] Shortness of breath when laying flat   [] Shortness of breath with exertion. Vascular:  [x] Pain in legs with walking   [x] Pain in legs at rest  [] History of DVT   [] Phlebitis   [] Swelling in legs   [] Varicose veins   [] Non-healing ulcers Pulmonary:   [] Uses home oxygen   [] Productive cough   [] Hemoptysis   [] Wheeze  [] COPD   [] Asthma Neurologic:  [] Dizziness   [] Seizures   [] History of stroke   [] History of TIA  [] Aphasia   [] Vissual changes   [] Weakness or numbness in arm   [] Weakness or numbness in leg Musculoskeletal:   [] Joint swelling   [] Joint pain   [] Low back pain Hematologic:  [] Easy bruising  [] Easy bleeding   [] Hypercoagulable state   [] Anemic Gastrointestinal:  [] Diarrhea   [] Vomiting  [] Gastroesophageal reflux/heartburn   [] Difficulty swallowing. Genitourinary:  [] Chronic kidney disease   [] Difficult urination  [] Frequent urination   [] Blood in urine Skin:  [] Rashes   [] Ulcers  Psychological:  [] History of anxiety   []  History of major depression.  Physical Examination  Vitals:   08/14/18 0837  BP: (!) 146/79  Pulse: 87  Resp: 16  Weight: 178 lb (80.7 kg)  Height: 4\' 11"  (1.499 m)   Body mass index is 35.95 kg/m. Gen: WD/WN, NAD Head: [place]/AT, No temporalis wasting.  Ear/Nose/Throat: Hearing grossly intact, nares w/o erythema or drainage Eyes: PER, EOMI, sclera nonicteric.  Neck: Supple, no large masses.   Pulmonary:  Good air movement, no audible wheezing bilaterally, no use of accessory muscles.  Cardiac: RRR, no JVD Vascular:  Vessel Right Left  Radial Palpable Palpable  PT Not Palpable Not Palpable  DP Not Palpable Not Palpable  Gastrointestinal: Non-distended. No guarding/no peritoneal signs.  Musculoskeletal: M/S 5/5 throughout.  No deformity or atrophy.  Neurologic: CN 2-12 intact. Symmetrical.  Speech is fluent. Motor exam as listed above. Psychiatric: Judgment intact, Mood & affect appropriate for pt's clinical situation. Dermatologic: No  rashes or ulcers noted.  No changes consistent with cellulitis. Lymph : No lichenification or skin changes of chronic lymphedema.  CBC Lab Results  Component Value Date   WBC 8.2 04/09/2018   HGB 10.9 (L) 04/09/2018   HCT 31.7 (L) 04/09/2018   MCV 85.6 04/09/2018   PLT 214 04/09/2018    BMET    Component Value Date/Time   NA 140 04/09/2018 0719   K 3.5 04/09/2018 0719   CL 110 04/09/2018 0719   CO2 23 04/09/2018 0719   GLUCOSE 147 (H) 04/09/2018 0719   BUN 19 08/04/2018 0908   CREATININE 0.78 08/04/2018 0908   CALCIUM 7.6 (L) 04/09/2018 0719   GFRNONAA >60 08/04/2018 0908   GFRAA >60 08/04/2018 3875  Estimated Creatinine Clearance: 61 mL/min (by C-G formula based on SCr of 0.78 mg/dL).  COAG Lab Results  Component Value Date   INR 0.95 04/04/2018    Radiology Mm 3d Screen Breast Bilateral  Result Date: 08/13/2018 CLINICAL DATA:  Screening. EXAM: DIGITAL SCREENING BILATERAL MAMMOGRAM WITH TOMO AND CAD COMPARISON:  Previous exam(s). ACR Breast Density Category b: There are scattered areas of fibroglandular density. FINDINGS: There are no findings suspicious for malignancy. Images were processed with CAD. IMPRESSION: No mammographic evidence of malignancy. A result letter of this screening mammogram will be mailed directly to the patient. RECOMMENDATION: Screening mammogram in one year. (Code:SM-B-01Y) BI-RADS CATEGORY  1: Negative. Electronically Signed   By: Dorise Bullion III M.D   On: 08/13/2018 08:59     Assessment/Plan 1. PAD (peripheral artery disease) (HCC) Given the multilevel nature of her disease as well as the profound calcification involved both at the aortic bifurcation within the external iliacs and most importantly within the common femoral arteries bilaterally I believe a CT scan is indicated to better assess and plan for reconstruction.  Given the common femoral involvement hybrid procedure with open endarterectomy is likely the best solution and then  intervention can be performed to reconstruct the distal aorta and external iliac arteries avoiding the need for an aortobifemoral bypass.  Risk and benefits were reviewed the patient.  Indications for the procedure were reviewed.  All questions were answered, the patient agrees to proceed.   A total of 30 minutes was spent with this patient and greater than 50% was spent in counseling and coordination of care with the patient.  Discussion included the treatment options for vascular disease including indications for surgery and intervention.  Also discussed is the appropriate timing of treatment.  In addition medical therapy was discussed.     [name], MD  08/14/2018 9:03 AM

## 2018-08-18 ENCOUNTER — Encounter (INDEPENDENT_AMBULATORY_CARE_PROVIDER_SITE_OTHER): Payer: Self-pay | Admitting: Vascular Surgery

## 2018-09-16 DIAGNOSIS — Z4789 Encounter for other orthopedic aftercare: Secondary | ICD-10-CM | POA: Diagnosis not present

## 2018-09-18 DIAGNOSIS — I2510 Atherosclerotic heart disease of native coronary artery without angina pectoris: Secondary | ICD-10-CM | POA: Diagnosis not present

## 2018-09-18 DIAGNOSIS — Z8679 Personal history of other diseases of the circulatory system: Secondary | ICD-10-CM | POA: Diagnosis not present

## 2018-09-18 DIAGNOSIS — Z01810 Encounter for preprocedural cardiovascular examination: Secondary | ICD-10-CM | POA: Diagnosis not present

## 2018-09-18 DIAGNOSIS — E7800 Pure hypercholesterolemia, unspecified: Secondary | ICD-10-CM | POA: Diagnosis not present

## 2018-09-18 DIAGNOSIS — I10 Essential (primary) hypertension: Secondary | ICD-10-CM | POA: Diagnosis not present

## 2018-09-18 DIAGNOSIS — E669 Obesity, unspecified: Secondary | ICD-10-CM | POA: Diagnosis not present

## 2018-09-18 DIAGNOSIS — Z9889 Other specified postprocedural states: Secondary | ICD-10-CM | POA: Diagnosis not present

## 2018-09-19 ENCOUNTER — Encounter: Payer: Self-pay | Admitting: *Deleted

## 2018-09-22 ENCOUNTER — Ambulatory Visit
Admission: RE | Admit: 2018-09-22 | Discharge: 2018-09-22 | Disposition: A | Discharge status: Home / Self Care | Payer: PPO | Attending: Gastroenterology | Admitting: Gastroenterology

## 2018-09-22 ENCOUNTER — Encounter: Payer: Self-pay | Admitting: Anesthesiology

## 2018-09-22 ENCOUNTER — Ambulatory Visit: Payer: PPO | Admitting: Anesthesiology

## 2018-09-22 ENCOUNTER — Encounter
Admission: RE | Disposition: A | Discharge status: Home / Self Care | Payer: Self-pay | Source: Home / Self Care | Attending: Gastroenterology

## 2018-09-22 DIAGNOSIS — K5790 Diverticulosis of intestine, part unspecified, without perforation or abscess without bleeding: Secondary | ICD-10-CM | POA: Diagnosis not present

## 2018-09-22 DIAGNOSIS — Z7902 Long term (current) use of antithrombotics/antiplatelets: Secondary | ICD-10-CM | POA: Diagnosis not present

## 2018-09-22 DIAGNOSIS — I110 Hypertensive heart disease with heart failure: Secondary | ICD-10-CM | POA: Diagnosis not present

## 2018-09-22 DIAGNOSIS — E785 Hyperlipidemia, unspecified: Secondary | ICD-10-CM | POA: Insufficient documentation

## 2018-09-22 DIAGNOSIS — D120 Benign neoplasm of cecum: Secondary | ICD-10-CM | POA: Diagnosis not present

## 2018-09-22 DIAGNOSIS — K648 Other hemorrhoids: Secondary | ICD-10-CM | POA: Diagnosis not present

## 2018-09-22 DIAGNOSIS — Z79899 Other long term (current) drug therapy: Secondary | ICD-10-CM | POA: Insufficient documentation

## 2018-09-22 DIAGNOSIS — K635 Polyp of colon: Secondary | ICD-10-CM | POA: Diagnosis not present

## 2018-09-22 DIAGNOSIS — I509 Heart failure, unspecified: Secondary | ICD-10-CM | POA: Insufficient documentation

## 2018-09-22 DIAGNOSIS — K5730 Diverticulosis of large intestine without perforation or abscess without bleeding: Secondary | ICD-10-CM | POA: Diagnosis not present

## 2018-09-22 DIAGNOSIS — Z955 Presence of coronary angioplasty implant and graft: Secondary | ICD-10-CM | POA: Insufficient documentation

## 2018-09-22 DIAGNOSIS — Z7984 Long term (current) use of oral hypoglycemic drugs: Secondary | ICD-10-CM | POA: Diagnosis not present

## 2018-09-22 DIAGNOSIS — D126 Benign neoplasm of colon, unspecified: Secondary | ICD-10-CM | POA: Diagnosis not present

## 2018-09-22 DIAGNOSIS — Z85828 Personal history of other malignant neoplasm of skin: Secondary | ICD-10-CM | POA: Diagnosis not present

## 2018-09-22 DIAGNOSIS — E1151 Type 2 diabetes mellitus with diabetic peripheral angiopathy without gangrene: Secondary | ICD-10-CM | POA: Diagnosis not present

## 2018-09-22 DIAGNOSIS — Z7982 Long term (current) use of aspirin: Secondary | ICD-10-CM | POA: Diagnosis not present

## 2018-09-22 DIAGNOSIS — D124 Benign neoplasm of descending colon: Secondary | ICD-10-CM | POA: Diagnosis not present

## 2018-09-22 DIAGNOSIS — E119 Type 2 diabetes mellitus without complications: Secondary | ICD-10-CM | POA: Diagnosis not present

## 2018-09-22 DIAGNOSIS — Z1211 Encounter for screening for malignant neoplasm of colon: Secondary | ICD-10-CM | POA: Diagnosis not present

## 2018-09-22 DIAGNOSIS — Z86010 Personal history of colonic polyps: Secondary | ICD-10-CM | POA: Insufficient documentation

## 2018-09-22 DIAGNOSIS — Z87891 Personal history of nicotine dependence: Secondary | ICD-10-CM | POA: Diagnosis not present

## 2018-09-22 DIAGNOSIS — I2510 Atherosclerotic heart disease of native coronary artery without angina pectoris: Secondary | ICD-10-CM | POA: Diagnosis not present

## 2018-09-22 DIAGNOSIS — Z96611 Presence of right artificial shoulder joint: Secondary | ICD-10-CM | POA: Diagnosis not present

## 2018-09-22 DIAGNOSIS — D128 Benign neoplasm of rectum: Secondary | ICD-10-CM | POA: Diagnosis not present

## 2018-09-22 DIAGNOSIS — K621 Rectal polyp: Secondary | ICD-10-CM | POA: Diagnosis not present

## 2018-09-22 DIAGNOSIS — Z09 Encounter for follow-up examination after completed treatment for conditions other than malignant neoplasm: Secondary | ICD-10-CM | POA: Diagnosis present

## 2018-09-22 HISTORY — DX: Varicella without complication: B01.9

## 2018-09-22 HISTORY — DX: Polyp of colon: K63.5

## 2018-09-22 HISTORY — DX: Hyperlipidemia, unspecified: E78.5

## 2018-09-22 HISTORY — DX: Other shoulder lesions, unspecified shoulder: M75.80

## 2018-09-22 HISTORY — DX: Heart failure, unspecified: I50.9

## 2018-09-22 HISTORY — PX: COLONOSCOPY WITH PROPOFOL: SHX5780

## 2018-09-22 HISTORY — DX: Diverticulosis of intestine, part unspecified, without perforation or abscess without bleeding: K57.90

## 2018-09-22 LAB — GLUCOSE, CAPILLARY: Glucose-Capillary: 177 mg/dL — ABNORMAL HIGH (ref 70–99)

## 2018-09-22 SURGERY — COLONOSCOPY WITH PROPOFOL
Anesthesia: General

## 2018-09-22 MED ORDER — PHENYLEPHRINE HCL 10 MG/ML IJ SOLN
INTRAMUSCULAR | Status: DC | PRN
  Administered 2018-09-22 (×2): 50 ug via INTRAVENOUS

## 2018-09-22 MED ORDER — PHENYLEPHRINE HCL 10 MG/ML IJ SOLN
INTRAMUSCULAR | Status: AC
  Filled 2018-09-22: qty 1

## 2018-09-22 MED ORDER — EPHEDRINE SULFATE 50 MG/ML IJ SOLN
INTRAMUSCULAR | Status: DC | PRN
  Administered 2018-09-22 (×2): 5 mg via INTRAVENOUS
  Administered 2018-09-22 (×2): 10 mg via INTRAVENOUS

## 2018-09-22 MED ORDER — FENTANYL CITRATE (PF) 100 MCG/2ML IJ SOLN
INTRAMUSCULAR | Status: AC
  Filled 2018-09-22: qty 2

## 2018-09-22 MED ORDER — SODIUM CHLORIDE 0.9 % IV SOLN
INTRAVENOUS | Status: DC
  Administered 2018-09-22: 1000 mL via INTRAVENOUS

## 2018-09-22 MED ORDER — PROPOFOL 500 MG/50ML IV EMUL
INTRAVENOUS | Status: AC
  Filled 2018-09-22: qty 50

## 2018-09-22 MED ORDER — EPHEDRINE SULFATE 50 MG/ML IJ SOLN
INTRAMUSCULAR | Status: AC
  Filled 2018-09-22: qty 1

## 2018-09-22 MED ORDER — MIDAZOLAM HCL 2 MG/2ML IJ SOLN
INTRAMUSCULAR | Status: DC | PRN
  Administered 2018-09-22: 1 mg via INTRAVENOUS

## 2018-09-22 MED ORDER — SODIUM CHLORIDE 0.9 % IV SOLN
INTRAVENOUS | Status: AC
  Administered 2018-09-22: 2 g
  Filled 2018-09-22: qty 2000

## 2018-09-22 MED ORDER — PROPOFOL 500 MG/50ML IV EMUL
INTRAVENOUS | Status: DC | PRN
  Administered 2018-09-22: 100 ug/kg/min via INTRAVENOUS

## 2018-09-22 MED ORDER — SODIUM CHLORIDE 0.9 % IV SOLN: 2.0000 g | Freq: Four times a day (QID) | INTRAVENOUS | Status: DC

## 2018-09-22 MED ORDER — MIDAZOLAM HCL 2 MG/2ML IJ SOLN
INTRAMUSCULAR | Status: AC
  Filled 2018-09-22: qty 2

## 2018-09-22 MED ORDER — FENTANYL CITRATE (PF) 100 MCG/2ML IJ SOLN
INTRAMUSCULAR | Status: DC | PRN
  Administered 2018-09-22: 50 ug via INTRAVENOUS

## 2018-09-22 MED ORDER — SODIUM CHLORIDE 0.9 % IV SOLN: INTRAVENOUS | Status: DC

## 2018-09-22 NOTE — Op Note (Signed)
[facility] Gastroenterology Patient Name: Christina Blackburn Procedure Date: 09/22/2018 7:38 AM MRN: [id number] Account #: [id number] Date of Birth: 01/27/1946 Admit Type: Outpatient Age: 72 Room: [facility] ENDO ROOM 1 Gender: Female Note Status: Finalized Procedure:            Colonoscopy Indications:          Personal history of colonic polyps Providers:            Lollie Sails, MD Referring MD:         Leonie Douglas. Doy Hutching, MD (Referring MD) Medicines:            Monitored Anesthesia Care Complications:        No immediate complications. Procedure:            Pre-Anesthesia Assessment:                       - ASA Grade Assessment: III - A patient with severe                        systemic disease.                       After obtaining informed consent, the colonoscope was                        passed under direct vision. Throughout the procedure,                        the patient's blood pressure, pulse, and oxygen                        saturations were monitored continuously. The                        Colonoscope was introduced through the anus and                        advanced to the the cecum, identified by appendiceal                        orifice and ileocecal valve. The colonoscopy was                        performed without difficulty. The patient tolerated the                        procedure well. The quality of the bowel preparation                        was good. Findings:      Multiple small and large-mouthed diverticula were found in the sigmoid       colon and descending colon.      A 4 mm polyp was found in the proximal descending colon. The polyp was       sessile. The polyp was removed with a cold snare. Resection and       retrieval were complete.      A 2 mm polyp was found in the hepatic flexure. The polyp was sessile.       The polyp was removed with a cold biopsy forceps. Resection and  retrieval were complete.      Four  sessile polyps were found in the cecum. The polyps were 1 to 3 mm       in size. These polyps were removed with a piecemeal technique using a       cold biopsy forceps. Resection and retrieval were complete.      A 2 mm polyp was found in the sigmoid colon. The polyp was sessile. The       polyp was removed with a cold biopsy forceps. Resection and retrieval       were complete.      Two sessile polyps were found in the rectum. The polyps were less than 1       mm in size. These polyps were removed with a cold biopsy forceps.       Resection and retrieval were complete.      Non-bleeding internal hemorrhoids were found during retroflexion. The       hemorrhoids were small.      The digital rectal exam was normal otherwise. Impression:           - Diverticulosis in the sigmoid colon and in the                        descending colon.                       - One 4 mm polyp in the proximal descending colon,                        removed with a cold snare. Resected and retrieved.                       - One 2 mm polyp at the hepatic flexure, removed with a                        cold biopsy forceps. Resected and retrieved.                       - Four 1 to 3 mm polyps in the cecum, removed piecemeal                        using a cold biopsy forceps. Resected and retrieved.                       - One 2 mm polyp in the sigmoid colon, removed with a                        cold biopsy forceps. Resected and retrieved.                       - Two less than 1 mm polyps in the rectum, removed with                        a cold biopsy forceps. Resected and retrieved.                       - Non-bleeding internal hemorrhoids. Recommendation:       - Discharge patient to home. Procedure Code(s):    --- Professional ---  45385, Colonoscopy, flexible; with removal of tumor(s),                        polyp(s), or other lesion(s) by snare technique                       45380, 59,  Colonoscopy, flexible; with biopsy, single                        or multiple Diagnosis Code(s):    --- Professional ---                       K64.8, Other hemorrhoids                       D12.4, Benign neoplasm of descending colon                       D12.3, Benign neoplasm of transverse colon (hepatic                        flexure or splenic flexure)                       D12.5, Benign neoplasm of sigmoid colon                       D12.0, Benign neoplasm of cecum                       K62.1, Rectal polyp                       Z86.010, Personal history of colonic polyps                       K57.30, Diverticulosis of large intestine without                        perforation or abscess without bleeding CPT copyright 2018 American Medical Association. All rights reserved. The codes documented in this report are preliminary and upon coder review may  be revised to meet current compliance requirements. Lollie Sails, MD 09/22/2018 8:21:14 AM This report has been signed electronically. Number of Addenda: 0 Note Initiated On: 09/22/2018 7:38 AM Scope Withdrawal Time: 0 hours 18 minutes 45 seconds  Total Procedure Duration: 0 hours 32 minutes 20 seconds       [facility]

## 2018-09-22 NOTE — Anesthesia Preprocedure Evaluation (Signed)
Anesthesia Evaluation  Patient identified by MRN, date of birth, ID band Patient awake    Reviewed: Allergy & Precautions, H&P , NPO status , Patient's Chart, lab work & pertinent test results  History of Anesthesia Complications (+) PONV and history of anesthetic complications  Airway Mallampati: III  TM Distance: <3 FB Neck ROM: full    Dental  (+) Chipped, Poor Dentition, Missing, Upper Dentures   Pulmonary neg pulmonary ROS, neg shortness of breath, former smoker,           Cardiovascular Exercise Tolerance: Good hypertension, (-) angina+ CAD, + Peripheral Vascular Disease and +CHF  (-) Past MI      Neuro/Psych negative neurological ROS  negative psych ROS   GI/Hepatic negative GI ROS, Neg liver ROS, neg GERD  ,  Endo/Other  diabetes, Type 2  Renal/GU negative Renal ROS  negative genitourinary   Musculoskeletal   Abdominal   Peds  Hematology negative hematology ROS (+)   Anesthesia Other Findings Past Medical History: No date: CHF (congestive heart failure) (HCC) No date: Chicken pox No date: Colon polyps No date: Complication of anesthesia No date: Coronary artery disease 1986: Cystitis 2001: Diabetes mellitus without complication ([place]) No date: Diverticulosis 2008: Heart disease No date: Hyperlipidemia 2003: Hypertension No date: PAD (peripheral artery disease) (HCC) No date: PONV (postoperative nausea and vomiting) No date: Rotator cuff tendinitis 2016: Squamous cell skin cancer  Past Surgical History: 1989: ABDOMINAL HYSTERECTOMY 2014: BREAST BIOPSY; Left     Comment:  neg No date: CARDIAC CATHETERIZATION     Comment:  cornary stent 1971, 1975: [place]: CHOLECYSTECTOMY No date: COLONOSCOPY 2008: CORONARY ANGIOPLASTY     Comment:  stent x1 04/12/2018: JOINT REPLACEMENT; Right 08/05/2018: LOWER EXTREMITY ANGIOGRAPHY; Left     Comment:  Procedure: LOWER EXTREMITY ANGIOGRAPHY;   Surgeon:               Katha Cabal, MD;  Location: [facility];               Service: Cardiovascular;  Laterality: Left; 04/08/2018: REVERSE SHOULDER ARTHROPLASTY; Right     Comment:  Procedure: REVERSE SHOULDER ARTHROPLASTY;  Surgeon:               Corky Mull, MD;  Location: [facility] ORS;  Service:               Orthopedics;  Laterality: Right; No date: TUBAL LIGATION  BMI    Body Mass Index:  34.27 kg/m      Reproductive/Obstetrics negative OB ROS                             Anesthesia Physical Anesthesia Plan  ASA: III  Anesthesia Plan: General   Post-op Pain Management:    Induction: Intravenous  PONV Risk Score and Plan: Propofol infusion and TIVA  Airway Management Planned: Natural Airway and Nasal Cannula  Additional Equipment:   Intra-op Plan:   Post-operative Plan:   Informed Consent: I have reviewed the patients History and Physical, chart, labs and discussed the procedure including the risks, benefits and alternatives for the proposed anesthesia with the patient or authorized representative who has indicated his/her understanding and acceptance.   Dental Advisory Given  Plan Discussed with: Anesthesiologist, CRNA and Surgeon  Anesthesia Plan Comments: (Patient consented for risks of anesthesia including but not limited to:  - adverse reactions to medications - risk of intubation if required -  damage to teeth, lips or other oral mucosa - sore throat or hoarseness - Damage to heart, brain, lungs or loss of life  Patient voiced understanding.)        Anesthesia Quick Evaluation  

## 2018-09-22 NOTE — Transfer of Care (Signed)
Immediate Anesthesia Transfer of Care Note  Patient: Christina Blackburn  Procedure(s) Performed: COLONOSCOPY WITH PROPOFOL (N/A )  Patient Location: PACU  Anesthesia Type:General  Level of Consciousness: awake and sedated  Airway & Oxygen Therapy: Patient Spontanous Breathing and Patient connected to face mask oxygen  Post-op Assessment: Report given to RN and Post -op Vital signs reviewed and stable  Post vital signs: Reviewed and stable  Last Vitals:  Vitals Value Taken Time  BP    Temp    Pulse    Resp    SpO2      Last Pain:  Vitals:   09/22/18 0654  TempSrc: Tympanic  PainSc: 0-No pain         Complications: No apparent anesthesia complications

## 2018-09-22 NOTE — Anesthesia Procedure Notes (Signed)
Performed by: Cook-Martin, Zebulon Gantt °Pre-anesthesia Checklist: Patient identified, Emergency Drugs available, Suction available, Patient being monitored and Timeout performed °Patient Re-evaluated:Patient Re-evaluated prior to induction °Oxygen Delivery Method: Simple face mask °Preoxygenation: Pre-oxygenation with 100% oxygen °Induction Type: IV induction °Placement Confirmation: positive ETCO2 and CO2 detector ° ° ° ° ° ° °

## 2018-09-22 NOTE — Anesthesia Post-op Follow-up Note (Signed)
Anesthesia QCDR form completed.        

## 2018-09-22 NOTE — Anesthesia Postprocedure Evaluation (Signed)
Anesthesia Post Note  Patient: Christina Blackburn  Procedure(s) Performed: COLONOSCOPY WITH PROPOFOL (N/A )  Patient location during evaluation: Endoscopy Anesthesia Type: General Level of consciousness: awake and alert Pain management: pain level controlled Vital Signs Assessment: post-procedure vital signs reviewed and stable Respiratory status: spontaneous breathing, nonlabored ventilation, respiratory function stable and patient connected to nasal cannula oxygen Cardiovascular status: blood pressure returned to baseline and stable Postop Assessment: no apparent nausea or vomiting Anesthetic complications: no     Last Vitals:  Vitals:   09/22/18 0832 09/22/18 0852  BP: 126/67 (!) 141/71  Pulse:    Resp:    Temp:    SpO2:      Last Pain:  Vitals:   09/22/18 0852  TempSrc:   PainSc: 0-No pain                 Precious Haws Piscitello

## 2018-09-22 NOTE — H&P (Signed)
Outpatient short stay form Pre-procedure 09/22/2018 3:17 PM Christina Sails MD  Primary Physician: Christina Reek, MD  Reason for visit: Colonoscopy  History of present illness: Patient is a 72 year old female presenting today for a colonoscopy in regards to her personal history of adenomatous colon polyps.  She tolerated her prep well.  She takes Plavix which she has held for a week.  She also takes a 81 mg aspirin that she has held for at least several days.  She takes no other aspirin products or blood thinning agent.   No current facility-administered medications for this encounter.   Current Outpatient Medications:  .  amLODipine (NORVASC) 5 MG tablet, Take 5 mg by mouth daily., Disp: , Rfl:  .  aspirin 81 MG tablet, Take 81 mg by mouth daily., Disp: , Rfl:  .  atorvastatin (LIPITOR) 20 MG tablet, Take 20 mg by mouth daily. , Disp: , Rfl:  .  calcium carbonate (OS-CAL) 600 MG TABS tablet, Take 600 mg by mouth 2 (two) times daily with a meal., Disp: , Rfl:  .  clopidogrel (PLAVIX) 75 MG tablet, Take 75 mg by mouth daily., Disp: , Rfl:  .  fenofibrate (TRICOR) 145 MG tablet, Take 145 mg by mouth daily., Disp: , Rfl:  .  fish oil-omega-3 fatty acids 1000 MG capsule, Take 1 g by mouth daily., Disp: , Rfl:  .  glimepiride (AMARYL) 4 MG tablet, Take 4 mg by mouth daily with breakfast. , Disp: , Rfl:  .  losartan-hydrochlorothiazide (HYZAAR) 100-25 MG tablet, Take 1 tablet by mouth daily. , Disp: , Rfl:  .  magnesium oxide (MAG-OX) 400 MG tablet, Take 400 mg by mouth daily., Disp: , Rfl:  .  metFORMIN (GLUCOPHAGE) 1000 MG tablet, Take 1,000 mg by mouth daily with breakfast. , Disp: , Rfl:  .  metoprolol succinate (TOPROL-XL) 100 MG 24 hr tablet, Take 100 mg by mouth daily. Take with or immediately following a meal., Disp: , Rfl:  .  OVER THE COUNTER MEDICATION, Take 2 tablets by mouth at bedtime. Mag R&R Supplement, Disp: , Rfl:  .  traZODone (DESYREL) 50 MG tablet, Take 50 mg by mouth at  bedtime., Disp: , Rfl:  .  vitamin B-12 (CYANOCOBALAMIN) 1000 MCG tablet, Take 1,000 mcg by mouth daily., Disp: , Rfl:  .  vitamin E 400 UNIT capsule, Take 400 Units by mouth daily. , Disp: , Rfl:   No medications prior to admission.     Allergies  Allergen Reactions  . Tape Other (See Comments)    Hypoallergenic tape - blisters     Past Medical History:  Diagnosis Date  . CHF (congestive heart failure) ([place])   . Chicken pox   . Colon polyps   . Complication of anesthesia   . Coronary artery disease   . Cystitis 1986  . Diabetes mellitus without complication ([place]) 1610  . Diverticulosis   . Heart disease 2008  . Hyperlipidemia   . Hypertension 2003  . PAD (peripheral artery disease) ([place])   . PONV (postoperative nausea and vomiting)   . Rotator cuff tendinitis   . Squamous cell skin cancer 2016    Review of systems:      Physical Exam    Heart and lungs: Regular rate and rhythm without rub or gallop, lungs are bilaterally clear.    HEENT: Normocephalic atraumatic eyes are anicteric    Other:    Pertinant exam for procedure: Soft nontender nondistended bowel sounds positive normoactive.  Planned proceedures: Colonoscopy and indicated procedures. I have discussed the risks benefits and complications of procedures to include not limited to bleeding, infection, perforation and the risk of sedation and the patient wishes to proceed.    Christina Sails, MD Gastroenterology 09/22/2018  3:17 PM

## 2018-09-25 ENCOUNTER — Ambulatory Visit (INDEPENDENT_AMBULATORY_CARE_PROVIDER_SITE_OTHER): Payer: PPO | Admitting: Vascular Surgery

## 2018-09-25 LAB — SURGICAL PATHOLOGY

## 2018-09-30 DIAGNOSIS — I2510 Atherosclerotic heart disease of native coronary artery without angina pectoris: Secondary | ICD-10-CM | POA: Diagnosis not present

## 2018-09-30 DIAGNOSIS — Z01810 Encounter for preprocedural cardiovascular examination: Secondary | ICD-10-CM | POA: Diagnosis not present

## 2018-09-30 DIAGNOSIS — Z8679 Personal history of other diseases of the circulatory system: Secondary | ICD-10-CM | POA: Diagnosis not present

## 2018-10-06 DIAGNOSIS — Z8679 Personal history of other diseases of the circulatory system: Secondary | ICD-10-CM | POA: Diagnosis not present

## 2018-10-06 DIAGNOSIS — I10 Essential (primary) hypertension: Secondary | ICD-10-CM | POA: Diagnosis not present

## 2018-10-06 DIAGNOSIS — E1169 Type 2 diabetes mellitus with other specified complication: Secondary | ICD-10-CM | POA: Diagnosis not present

## 2018-10-06 DIAGNOSIS — Z01810 Encounter for preprocedural cardiovascular examination: Secondary | ICD-10-CM | POA: Diagnosis not present

## 2018-10-06 DIAGNOSIS — I2510 Atherosclerotic heart disease of native coronary artery without angina pectoris: Secondary | ICD-10-CM | POA: Diagnosis not present

## 2018-10-06 DIAGNOSIS — Z9889 Other specified postprocedural states: Secondary | ICD-10-CM | POA: Diagnosis not present

## 2018-10-20 ENCOUNTER — Ambulatory Visit (INDEPENDENT_AMBULATORY_CARE_PROVIDER_SITE_OTHER): Payer: PPO | Admitting: Vascular Surgery

## 2018-10-20 ENCOUNTER — Encounter (INDEPENDENT_AMBULATORY_CARE_PROVIDER_SITE_OTHER): Payer: Self-pay | Admitting: Vascular Surgery

## 2018-10-20 VITALS — BP 143/79 | HR 78 | Resp 18 | Ht 60.0 in | Wt 176.6 lb

## 2018-10-20 DIAGNOSIS — I10 Essential (primary) hypertension: Secondary | ICD-10-CM

## 2018-10-20 DIAGNOSIS — I70223 Atherosclerosis of native arteries of extremities with rest pain, bilateral legs: Secondary | ICD-10-CM | POA: Diagnosis not present

## 2018-10-20 DIAGNOSIS — E782 Mixed hyperlipidemia: Secondary | ICD-10-CM

## 2018-10-20 DIAGNOSIS — Z87891 Personal history of nicotine dependence: Secondary | ICD-10-CM | POA: Diagnosis not present

## 2018-10-20 DIAGNOSIS — E1151 Type 2 diabetes mellitus with diabetic peripheral angiopathy without gangrene: Secondary | ICD-10-CM | POA: Diagnosis not present

## 2018-10-20 NOTE — Progress Notes (Signed)
MRN : [id number]  Christina Blackburn is a 73 y.o. (1946/06/23) female who presents with chief complaint of  Chief Complaint  Patient presents with  . Follow-up  .  History of Present Illness:   The patient returns to the office for followup and review status post angiogram without intervention on 08/05/2018. The patient notes continued problems with her lower extremity symptoms.  She feels there has been some interval shortening of the patient's claudication distance and her rest pain symptoms are worse.  No new ulcers or wounds have occurred since the last visit.  There have been no significant changes to the patient's overall health care.  The patient denies amaurosis fugax or recent TIA symptoms. There are no recent neurological changes noted. The patient denies history of DVT, PE or superficial thrombophlebitis. The patient denies recent episodes of angina or shortness of breath.    Angiogram performed August 05, 2018 demonstrated subtotal occlusions bilateral common femorals with bilateral common iliac and external iliac artery stenosis.  There also bilateral superficial femoral artery occlusions.   Current Meds  Medication Sig  . amLODipine (NORVASC) 5 MG tablet Take 5 mg by mouth daily.  Marland Kitchen aspirin 81 MG tablet Take 81 mg by mouth daily.  Marland Kitchen atorvastatin (LIPITOR) 20 MG tablet Take 20 mg by mouth daily.   . calcium carbonate (OS-CAL) 600 MG TABS tablet Take 600 mg by mouth 2 (two) times daily with a meal.  . Cholecalciferol (VITAMIN D3 PO) Take 400 Units by mouth.  . clopidogrel (PLAVIX) 75 MG tablet Take 75 mg by mouth daily.  . fenofibrate (TRICOR) 145 MG tablet Take 145 mg by mouth daily.  . fish oil-omega-3 fatty acids 1000 MG capsule Take 1 g by mouth daily.  Marland Kitchen glimepiride (AMARYL) 4 MG tablet Take 4 mg by mouth daily with breakfast.   . losartan-hydrochlorothiazide (HYZAAR) 100-25 MG tablet Take 1 tablet by mouth daily.   . magnesium oxide (MAG-OX) 400 MG tablet  Take 400 mg by mouth daily.  . metFORMIN (GLUCOPHAGE) 1000 MG tablet Take 1,000 mg by mouth daily with breakfast.   . metoprolol succinate (TOPROL-XL) 100 MG 24 hr tablet Take 100 mg by mouth daily. Take with or immediately following a meal.  . OVER THE COUNTER MEDICATION Take 2 tablets by mouth at bedtime. Mag R&R Supplement  . vitamin B-12 (CYANOCOBALAMIN) 1000 MCG tablet Take 1,000 mcg by mouth daily.  . vitamin E 400 UNIT capsule Take 400 Units by mouth daily.     Past Medical History:  Diagnosis Date  . CHF (congestive heart failure) ([place])   . Chicken pox   . Colon polyps   . Complication of anesthesia   . Coronary artery disease   . Cystitis 1986  . Diabetes mellitus without complication ([place]) 2751  . Diverticulosis   . Heart disease 2008  . Hyperlipidemia   . Hypertension 2003  . PAD (peripheral artery disease) ([place])   . PONV (postoperative nausea and vomiting)   . Rotator cuff tendinitis   . Squamous cell skin cancer 2016    Past Surgical History:  Procedure Laterality Date  . ABDOMINAL HYSTERECTOMY  1989  . BREAST BIOPSY Left 2014   neg  . CARDIAC CATHETERIZATION     cornary stent  . [place]  . CHOLECYSTECTOMY  1987  . COLONOSCOPY    . COLONOSCOPY WITH PROPOFOL N/A 09/22/2018   Procedure: COLONOSCOPY WITH PROPOFOL;  Surgeon: Lollie Sails, MD;  Location: [facility]  ENDOSCOPY;  Service: Endoscopy;  Laterality: N/A;  . CORONARY ANGIOPLASTY  2008   stent x1  . JOINT REPLACEMENT Right 04/12/2018  . LOWER EXTREMITY ANGIOGRAPHY Left 08/05/2018   Procedure: LOWER EXTREMITY ANGIOGRAPHY;  Surgeon: Katha Cabal, MD;  Location: [place] [facility];  Service: Cardiovascular;  Laterality: Left;  . REVERSE SHOULDER ARTHROPLASTY Right 04/08/2018   Procedure: REVERSE SHOULDER ARTHROPLASTY;  Surgeon: Corky Mull, MD;  Location: [facility] ORS;  Service: Orthopedics;  Laterality: Right;  . TUBAL LIGATION      Social History Social History   Tobacco Use    . Smoking status: Former Smoker    Years: 6.00    Types: Cigarettes    Last attempt to quit: 10/16/2007    Years since quitting: 11.0  . Smokeless tobacco: Never Used  Substance Use Topics  . Alcohol use: Yes    Comment: rare  . Drug use: No    Family History Family History  Problem Relation Age of Onset  . Colon cancer Cousin 55  . Lymphoma Brother 32  . Lung cancer Mother   . Hypertension Father   . Kidney failure Father   . Breast cancer Neg Hx     Allergies  Allergen Reactions  . Tape Other (See Comments)    Hypoallergenic tape - blisters     REVIEW OF SYSTEMS (Negative unless checked)  Constitutional: [] Weight loss  [] Fever  [] Chills Cardiac: [x] Chest pain   [] Chest pressure   [] Palpitations   [] Shortness of breath when laying flat   [x] Shortness of breath with exertion. Vascular:  [x] Pain in legs with walking   [x] Pain in legs at rest  [] History of DVT   [] Phlebitis   [] Swelling in legs   [] Varicose veins   [] Non-healing ulcers Pulmonary:   [] Uses home oxygen   [] Productive cough   [] Hemoptysis   [] Wheeze  [] COPD   [] Asthma Neurologic:  [] Dizziness   [] Seizures   [] History of stroke   [] History of TIA  [] Aphasia   [] Vissual changes   [] Weakness or numbness in arm   [] Weakness or numbness in leg Musculoskeletal:   [] Joint swelling   [x] Joint pain   [] Low back pain Hematologic:  [] Easy bruising  [] Easy bleeding   [] Hypercoagulable state   [] Anemic Gastrointestinal:  [] Diarrhea   [] Vomiting  [] Gastroesophageal reflux/heartburn   [] Difficulty swallowing. Genitourinary:  [] Chronic kidney disease   [] Difficult urination  [] Frequent urination   [] Blood in urine Skin:  [] Rashes   [] Ulcers  Psychological:  [] History of anxiety   []  History of major depression.  Physical Examination  Vitals:   10/20/18 0942  BP: (!) 143/79  Pulse: 78  Resp: 18  Weight: 176 lb 9.6 oz (80.1 kg)  Height: 5' (1.524 m)   Body mass index is 34.49 kg/m. Gen: WD/WN, NAD Head: [place]/AT, No  temporalis wasting.  Ear/Nose/Throat: Hearing grossly intact, nares w/o erythema or drainage Eyes: PER, EOMI, sclera nonicteric.  Neck: Supple, no large masses.   Pulmonary:  Good air movement, no audible wheezing bilaterally, no use of accessory muscles.  Cardiac: RRR, no JVD Vascular: Bilateral lower extremities are cool to the touch with markedly delayed capillary refill Vessel Right Left  Radial Palpable Palpable  PT Not Palpable Not Palpable  DP Not Palpable Not Palpable  Gastrointestinal: Non-distended. No guarding/no peritoneal signs.  Musculoskeletal: M/S 5/5 throughout.  No deformity or atrophy.  Neurologic: CN 2-12 intact. Symmetrical.  Speech is fluent. Motor exam as listed above. Psychiatric: Judgment intact, Mood & affect appropriate  for pt's clinical situation. Dermatologic: No rashes or ulcers noted.  No changes consistent with cellulitis. Lymph : No lichenification or skin changes of chronic lymphedema.  CBC Lab Results  Component Value Date   WBC 8.2 04/09/2018   HGB 10.9 (L) 04/09/2018   HCT 31.7 (L) 04/09/2018   MCV 85.6 04/09/2018   PLT 214 04/09/2018    BMET    Component Value Date/Time   NA 140 04/09/2018 0719   K 3.5 04/09/2018 0719   CL 110 04/09/2018 0719   CO2 23 04/09/2018 0719   GLUCOSE 147 (H) 04/09/2018 0719   BUN 19 08/04/2018 0908   CREATININE 0.78 08/04/2018 0908   CALCIUM 7.6 (L) 04/09/2018 0719   GFRNONAA >60 08/04/2018 0908   GFRAA >60 08/04/2018 0908   CrCl cannot be calculated (Patient's most recent lab result is older than the maximum 21 days allowed.).  COAG Lab Results  Component Value Date   INR 0.95 04/04/2018    Radiology No results found.   Assessment/Plan 1. Atherosclerosis of native artery of both lower extremities with rest pain ([place])  Recommend:  The patient has evidence of severe atherosclerotic changes of both lower extremities associated with rest pain of the feet.  This represents a limb threatening  ischemia and places the patient at the risk for limb loss.  Angiography has been performed and the situation is not ideal for intervention, given the bilateral common femoral artery disease.  Given this finding open surgical repair is recommended.   Patient should undergo arterial reconstruction of the lower extremity with the hope for limb salvage.  The risks and benefits as well as the alternative therapies was discussed in detail with the patient.  All questions were answered.  Patient agrees to proceed with bilateral femoral endarterectomies in surgery associated with bilateral iliac interventions at that time.  The patient will follow up with me in the office after the procedure.    A total of 30 minutes was spent with this patient and greater than 50% was spent in counseling and coordination of care with the patient.  Discussion included the treatment options for vascular disease including indications for surgery and intervention.  Also discussed is the appropriate timing of treatment.  In addition medical therapy was discussed.  2. Essential hypertension Continue antihypertensive medications as already ordered, these medications have been reviewed and there are no changes at this time.   3. Type 2 diabetes mellitus with diabetic peripheral angiopathy without gangrene, without long-term current use of insulin (HCC) Continue hypoglycemic medications as already ordered, these medications have been reviewed and there are no changes at this time.  Hgb A1C to be monitored as already arranged by primary service   4. Mixed hyperlipidemia Alignment is okayContinue statin as ordered and reviewed, no changes at this time     [name], MD  10/20/2018 9:59 AM

## 2018-10-21 ENCOUNTER — Encounter (INDEPENDENT_AMBULATORY_CARE_PROVIDER_SITE_OTHER): Payer: Self-pay

## 2018-10-21 ENCOUNTER — Encounter (INDEPENDENT_AMBULATORY_CARE_PROVIDER_SITE_OTHER): Payer: Self-pay | Admitting: Vascular Surgery

## 2018-10-22 ENCOUNTER — Encounter (INDEPENDENT_AMBULATORY_CARE_PROVIDER_SITE_OTHER): Payer: Self-pay | Admitting: Vascular Surgery

## 2018-10-22 DIAGNOSIS — Z79899 Other long term (current) drug therapy: Secondary | ICD-10-CM | POA: Diagnosis not present

## 2018-10-22 DIAGNOSIS — E1169 Type 2 diabetes mellitus with other specified complication: Secondary | ICD-10-CM | POA: Diagnosis not present

## 2018-10-29 ENCOUNTER — Other Ambulatory Visit (INDEPENDENT_AMBULATORY_CARE_PROVIDER_SITE_OTHER): Payer: Self-pay | Admitting: Vascular Surgery

## 2018-10-29 DIAGNOSIS — E1165 Type 2 diabetes mellitus with hyperglycemia: Secondary | ICD-10-CM | POA: Diagnosis not present

## 2018-10-29 DIAGNOSIS — I10 Essential (primary) hypertension: Secondary | ICD-10-CM | POA: Diagnosis not present

## 2018-10-29 DIAGNOSIS — E7800 Pure hypercholesterolemia, unspecified: Secondary | ICD-10-CM | POA: Diagnosis not present

## 2018-10-30 ENCOUNTER — Encounter
Admission: RE | Admit: 2018-10-30 | Discharge: 2018-10-30 | Disposition: A | Discharge status: Home / Self Care | Payer: PPO | Source: Ambulatory Visit | Attending: Vascular Surgery | Admitting: Vascular Surgery

## 2018-10-30 ENCOUNTER — Other Ambulatory Visit: Payer: Self-pay

## 2018-10-30 DIAGNOSIS — Z01818 Encounter for other preprocedural examination: Secondary | ICD-10-CM | POA: Insufficient documentation

## 2018-10-30 DIAGNOSIS — I70219 Atherosclerosis of native arteries of extremities with intermittent claudication, unspecified extremity: Secondary | ICD-10-CM | POA: Diagnosis not present

## 2018-10-30 DIAGNOSIS — Z01810 Encounter for preprocedural cardiovascular examination: Secondary | ICD-10-CM | POA: Diagnosis not present

## 2018-10-30 LAB — BASIC METABOLIC PANEL WITH GFR
Anion gap: 8 (ref 5–15)
BUN: 19 mg/dL (ref 8–23)
CO2: 30 mmol/L (ref 22–32)
Calcium: 10.1 mg/dL (ref 8.9–10.3)
Chloride: 103 mmol/L (ref 98–111)
Creatinine, Ser: 0.61 mg/dL (ref 0.44–1.00)
GFR calc Af Amer: >60 mL/min
GFR calc non Af Amer: >60 mL/min
Glucose, Bld: 77 mg/dL (ref 70–99)
Potassium: 3.5 mmol/L (ref 3.5–5.1)
Sodium: 141 mmol/L (ref 135–145)

## 2018-10-30 LAB — CBC WITH DIFFERENTIAL/PLATELET
Abs Immature Granulocytes: 0.02 K/uL (ref 0.00–0.07)
Basophils Absolute: 0.1 K/uL (ref 0.0–0.1)
Basophils Relative: 1 %
Eosinophils Absolute: 0.3 K/uL (ref 0.0–0.5)
Eosinophils Relative: 4 %
HCT: 39.6 % (ref 36.0–46.0)
Hemoglobin: 12.9 g/dL (ref 12.0–15.0)
Immature Granulocytes: 0 %
Lymphocytes Relative: 34 %
Lymphs Abs: 2.2 K/uL (ref 0.7–4.0)
MCH: 28 pg (ref 26.0–34.0)
MCHC: 32.6 g/dL (ref 30.0–36.0)
MCV: 86.1 fL (ref 80.0–100.0)
Monocytes Absolute: 0.5 K/uL (ref 0.1–1.0)
Monocytes Relative: 8 %
Neutro Abs: 3.5 K/uL (ref 1.7–7.7)
Neutrophils Relative %: 53 %
Platelets: 231 K/uL (ref 150–400)
RBC: 4.6 MIL/uL (ref 3.87–5.11)
RDW: 14.3 % (ref 11.5–15.5)
WBC: 6.5 K/uL (ref 4.0–10.5)
nRBC: 0 % (ref 0.0–0.2)

## 2018-10-30 LAB — TYPE AND SCREEN
ABO/RH(D): O NEG
Antibody Screen: NEGATIVE

## 2018-10-30 LAB — PROTIME-INR
INR: 0.97
Prothrombin Time: 12.8 seconds (ref 11.4–15.2)

## 2018-10-30 LAB — APTT: APTT: 28 s (ref 24–36)

## 2018-10-30 NOTE — Patient Instructions (Addendum)
  Your procedure is scheduled on: Wednesday November 12, 2018 Report to Same Day Surgery 2nd floor [facility] Entrance-take elevator on left to 2nd floor.  Check in with surgery information desk.) To find out your arrival time, call [phone] 1:00-3:00 PM on Tuesday November 11, 2018  Remember: Instructions that are not followed completely may result in serious medical risk, up to and including death, or upon the discretion of your surgeon and anesthesiologist your surgery may need to be rescheduled.    __x__ 1. Do not eat food (including mints, candies, chewing gum) after midnight the night before your procedure. You may drink water up to 2 hours before you are scheduled to arrive at the hospital for your procedure.  Do not drink anything within 2 hours of your scheduled arrival to the hospital.    __x__ 2. No Alcohol for 24 hours before or after surgery.   __x__ 3. No Smoking or e-cigarettes for 24 hours before surgery.  Do not use any chewable tobacco products for at least 6 hours before surgery.   __x__ 4. Notify your doctor if there is any change in your medical condition (cold, fever, infections).   __x__ 5. On the morning of surgery brush your teeth with toothpaste and water.  You may rinse your mouth with mouthwash if you wish.  Do not swallow any toothpaste or mouthwash.  Please read over the following fact sheets that you were given:   [facility] Preparing for Surgery and/or MRSA Information    __x__ Use CHG Soap or Sage wipes as directed on instruction sheet.   Do not wear jewelry, make-up, hairpins, clips or nail polish on the day of surgery.  Do not wear lotions, powders, deodorant, or perfumes.   Do not shave below the face/neck 48 hours prior to surgery.   Do not bring valuables to the hospital.    [facility] is not responsible for any belongings or valuables.               Contacts, dentures or bridgework may not be worn into surgery.  Leave your  suitcase in the car. After surgery it may be brought to your room.  For patients admitted to the hospital, discharge time is determined by your treatment team.        __x__ Take these medicines on the morning of surgery with A SMALL SIP OF WATER:  1. Amlodipine (Norvasc)  2. Metoprolol (Toprol)  3. Atorvastatin (Lipitor)  4. Fenofibrate (Tricor)  __x__ Do NOT take your Losartan-Hydrochlorothiazide (Hyzaar) on the morning of surgery.  __x__ Stop Metformin 2 days before surgery (Last dose Sunday November 09, 2018).    __x__ Follow recommendations from Cardiologist, Pulmonologist or PCP regarding stopping Aspirin, Coumadin, Plavix, Eliquis, Effient, Pradaxa, and Pletal. STOP Clopidogrel (Plavix) 5 days before surgery (Last dose Thursday November 06, 2018).  Continue taking Aspirin.  __x__ TODAY: Stop Anti-inflammatories such as Advil, Ibuprofen, Motrin, Aleve, Naproxen, Naprosyn, BC/Goodies powders or aspirin products. You may continue to take Tylenol and Celebrex.   __x__ TODAY: Stop over-the-counter supplements (Calcium, Fish Oil/Omega 3, R&R, Turmeric, Vitamin E) until after surgery. You may continue to take your Magnesium, Vitamin D, Vitamin B.

## 2018-11-11 MED ORDER — CEFAZOLIN SODIUM-DEXTROSE 2-4 GM/100ML-% IV SOLN: 2.0000 g | INTRAVENOUS | Status: DC

## 2018-11-12 ENCOUNTER — Inpatient Hospital Stay
Admission: RE | Admit: 2018-11-12 | Discharge: 2018-11-14 | DRG: 272 | Disposition: A | Discharge status: Home Health | Payer: PPO | Attending: Vascular Surgery | Admitting: Vascular Surgery

## 2018-11-12 ENCOUNTER — Inpatient Hospital Stay: Payer: PPO

## 2018-11-12 ENCOUNTER — Encounter
Admission: RE | Disposition: A | Discharge status: Home Health | Payer: Self-pay | Source: Home / Self Care | Attending: Vascular Surgery

## 2018-11-12 ENCOUNTER — Encounter: Payer: Self-pay | Admitting: *Deleted

## 2018-11-12 ENCOUNTER — Other Ambulatory Visit: Payer: Self-pay

## 2018-11-12 DIAGNOSIS — Z803 Family history of malignant neoplasm of breast: Secondary | ICD-10-CM

## 2018-11-12 DIAGNOSIS — I70229 Atherosclerosis of native arteries of extremities with rest pain, unspecified extremity: Secondary | ICD-10-CM

## 2018-11-12 DIAGNOSIS — Z87891 Personal history of nicotine dependence: Secondary | ICD-10-CM | POA: Diagnosis not present

## 2018-11-12 DIAGNOSIS — Z90710 Acquired absence of both cervix and uterus: Secondary | ICD-10-CM | POA: Diagnosis not present

## 2018-11-12 DIAGNOSIS — E1151 Type 2 diabetes mellitus with diabetic peripheral angiopathy without gangrene: Principal | ICD-10-CM | POA: Diagnosis present

## 2018-11-12 DIAGNOSIS — I70213 Atherosclerosis of native arteries of extremities with intermittent claudication, bilateral legs: Secondary | ICD-10-CM | POA: Diagnosis not present

## 2018-11-12 DIAGNOSIS — Z85828 Personal history of other malignant neoplasm of skin: Secondary | ICD-10-CM | POA: Diagnosis not present

## 2018-11-12 DIAGNOSIS — Z801 Family history of malignant neoplasm of trachea, bronchus and lung: Secondary | ICD-10-CM

## 2018-11-12 DIAGNOSIS — Z8249 Family history of ischemic heart disease and other diseases of the circulatory system: Secondary | ICD-10-CM

## 2018-11-12 DIAGNOSIS — K5790 Diverticulosis of intestine, part unspecified, without perforation or abscess without bleeding: Secondary | ICD-10-CM | POA: Diagnosis not present

## 2018-11-12 DIAGNOSIS — I509 Heart failure, unspecified: Secondary | ICD-10-CM | POA: Diagnosis not present

## 2018-11-12 DIAGNOSIS — Z96611 Presence of right artificial shoulder joint: Secondary | ICD-10-CM | POA: Diagnosis present

## 2018-11-12 DIAGNOSIS — Z800 Family history of malignant neoplasm of digestive organs: Secondary | ICD-10-CM | POA: Diagnosis not present

## 2018-11-12 DIAGNOSIS — I70219 Atherosclerosis of native arteries of extremities with intermittent claudication, unspecified extremity: Secondary | ICD-10-CM | POA: Diagnosis present

## 2018-11-12 DIAGNOSIS — E782 Mixed hyperlipidemia: Secondary | ICD-10-CM | POA: Diagnosis present

## 2018-11-12 DIAGNOSIS — Z7689 Persons encountering health services in other specified circumstances: Secondary | ICD-10-CM | POA: Diagnosis not present

## 2018-11-12 DIAGNOSIS — Z86010 Personal history of colonic polyps: Secondary | ICD-10-CM

## 2018-11-12 DIAGNOSIS — Z841 Family history of disorders of kidney and ureter: Secondary | ICD-10-CM | POA: Diagnosis not present

## 2018-11-12 DIAGNOSIS — I2510 Atherosclerotic heart disease of native coronary artery without angina pectoris: Secondary | ICD-10-CM | POA: Diagnosis not present

## 2018-11-12 DIAGNOSIS — I110 Hypertensive heart disease with heart failure: Secondary | ICD-10-CM | POA: Diagnosis present

## 2018-11-12 DIAGNOSIS — Z807 Family history of other malignant neoplasms of lymphoid, hematopoietic and related tissues: Secondary | ICD-10-CM | POA: Diagnosis not present

## 2018-11-12 DIAGNOSIS — I10 Essential (primary) hypertension: Secondary | ICD-10-CM

## 2018-11-12 DIAGNOSIS — E119 Type 2 diabetes mellitus without complications: Secondary | ICD-10-CM | POA: Diagnosis not present

## 2018-11-12 DIAGNOSIS — Z9049 Acquired absence of other specified parts of digestive tract: Secondary | ICD-10-CM | POA: Diagnosis not present

## 2018-11-12 DIAGNOSIS — E785 Hyperlipidemia, unspecified: Secondary | ICD-10-CM | POA: Diagnosis not present

## 2018-11-12 DIAGNOSIS — Z91048 Other nonmedicinal substance allergy status: Secondary | ICD-10-CM

## 2018-11-12 DIAGNOSIS — I70223 Atherosclerosis of native arteries of extremities with rest pain, bilateral legs: Secondary | ICD-10-CM | POA: Diagnosis not present

## 2018-11-12 HISTORY — PX: INSERTION OF ILIAC STENT: SHX6256

## 2018-11-12 HISTORY — PX: ENDARTERECTOMY FEMORAL: SHX5804

## 2018-11-12 LAB — CBC
HCT: 35 % — ABNORMAL LOW (ref 36.0–46.0)
Hemoglobin: 11.5 g/dL — ABNORMAL LOW (ref 12.0–15.0)
MCH: 28.3 pg (ref 26.0–34.0)
MCHC: 32.9 g/dL (ref 30.0–36.0)
MCV: 86 fL (ref 80.0–100.0)
Platelets: 243 10*3/uL (ref 150–400)
RBC: 4.07 MIL/uL (ref 3.87–5.11)
RDW: 14.4 % (ref 11.5–15.5)
WBC: 11.9 10*3/uL — AB (ref 4.0–10.5)
nRBC: 0 % (ref 0.0–0.2)

## 2018-11-12 LAB — GLUCOSE, CAPILLARY
Glucose-Capillary: 156 mg/dL — ABNORMAL HIGH (ref 70–99)
Glucose-Capillary: 240 mg/dL — ABNORMAL HIGH (ref 70–99)
Glucose-Capillary: 273 mg/dL — ABNORMAL HIGH (ref 70–99)
Glucose-Capillary: 241 mg/dL — ABNORMAL HIGH (ref 70–99)
Glucose-Capillary: 268 mg/dL — ABNORMAL HIGH (ref 70–99)

## 2018-11-12 LAB — CREATININE, SERUM
Creatinine, Ser: 0.72 mg/dL (ref 0.44–1.00)
GFR calc Af Amer: >60 mL/min (ref >60)
GFR calc non Af Amer: >60 mL/min (ref >60)

## 2018-11-12 LAB — MRSA PCR SCREENING: MRSA BY PCR: POSITIVE — AB

## 2018-11-12 SURGERY — ENDARTERECTOMY, FEMORAL
Anesthesia: General | Laterality: Bilateral

## 2018-11-12 MED ORDER — SUGAMMADEX SODIUM 200 MG/2ML IV SOLN
INTRAVENOUS | Status: DC | PRN
  Administered 2018-11-12: 160.2 mg via INTRAVENOUS

## 2018-11-12 MED ORDER — CHLORHEXIDINE GLUCONATE CLOTH 2 % EX PADS: 6.0000 | MEDICATED_PAD | Freq: Once | CUTANEOUS | Status: DC

## 2018-11-12 MED ORDER — FENOFIBRATE 160 MG PO TABS
160.0000 mg | ORAL_TABLET | Freq: Every day | ORAL | Status: DC
  Administered 2018-11-13 – 2018-11-14 (×2): 160 mg via ORAL
  Filled 2018-11-12 (×2): qty 1

## 2018-11-12 MED ORDER — ENOXAPARIN SODIUM 30 MG/0.3ML ~~LOC~~ SOLN: 30.0000 mg | SUBCUTANEOUS | Status: DC

## 2018-11-12 MED ORDER — FAMOTIDINE IN NACL 20-0.9 MG/50ML-% IV SOLN
20.0000 mg | Freq: Two times a day (BID) | INTRAVENOUS | Status: DC
  Administered 2018-11-12 – 2018-11-13 (×3): 20 mg via INTRAVENOUS
  Filled 2018-11-12 (×3): qty 50

## 2018-11-12 MED ORDER — SUCCINYLCHOLINE CHLORIDE 20 MG/ML IJ SOLN
INTRAMUSCULAR | Status: DC | PRN
  Administered 2018-11-12: 100 mg via INTRAVENOUS

## 2018-11-12 MED ORDER — FENTANYL CITRATE (PF) 100 MCG/2ML IJ SOLN
INTRAMUSCULAR | Status: AC
  Filled 2018-11-12: qty 2

## 2018-11-12 MED ORDER — HYDRALAZINE HCL 20 MG/ML IJ SOLN: 5.0000 mg | INTRAMUSCULAR | Status: DC | PRN

## 2018-11-12 MED ORDER — MIDAZOLAM HCL 2 MG/2ML IJ SOLN
INTRAMUSCULAR | Status: DC | PRN
  Administered 2018-11-12: 2 mg via INTRAVENOUS

## 2018-11-12 MED ORDER — DOCUSATE SODIUM 100 MG PO CAPS
100.0000 mg | ORAL_CAPSULE | Freq: Every day | ORAL | Status: DC
  Administered 2018-11-13 – 2018-11-14 (×2): 100 mg via ORAL
  Filled 2018-11-12 (×2): qty 1

## 2018-11-12 MED ORDER — LIDOCAINE HCL (CARDIAC) PF 100 MG/5ML IV SOSY
PREFILLED_SYRINGE | INTRAVENOUS | Status: DC | PRN
  Administered 2018-11-12: 100 mg via INTRAVENOUS

## 2018-11-12 MED ORDER — ATORVASTATIN CALCIUM 20 MG PO TABS
20.0000 mg | ORAL_TABLET | Freq: Every day | ORAL | Status: DC
  Administered 2018-11-13 – 2018-11-14 (×2): 20 mg via ORAL
  Filled 2018-11-12 (×2): qty 1

## 2018-11-12 MED ORDER — ASPIRIN EC 81 MG PO TBEC
81.0000 mg | DELAYED_RELEASE_TABLET | Freq: Every day | ORAL | Status: DC
  Administered 2018-11-12 – 2018-11-14 (×3): 81 mg via ORAL
  Filled 2018-11-12 (×3): qty 1

## 2018-11-12 MED ORDER — SODIUM CHLORIDE FLUSH 0.9 % IV SOLN
INTRAVENOUS | Status: AC
  Filled 2018-11-12: qty 10

## 2018-11-12 MED ORDER — MAGNESIUM OXIDE 400 (241.3 MG) MG PO TABS
400.0000 mg | ORAL_TABLET | Freq: Every day | ORAL | Status: DC
  Administered 2018-11-12 – 2018-11-14 (×3): 400 mg via ORAL
  Filled 2018-11-12 (×3): qty 1

## 2018-11-12 MED ORDER — DOPAMINE-DEXTROSE 3.2-5 MG/ML-% IV SOLN: 3.0000 ug/kg/min | INTRAVENOUS | Status: DC

## 2018-11-12 MED ORDER — SENNOSIDES-DOCUSATE SODIUM 8.6-50 MG PO TABS: 1.0000 | ORAL_TABLET | Freq: Every evening | ORAL | Status: DC | PRN

## 2018-11-12 MED ORDER — EVICEL 5 ML EX KIT
PACK | CUTANEOUS | Status: DC | PRN
  Administered 2018-11-12: 5 mL

## 2018-11-12 MED ORDER — ORAL CARE MOUTH RINSE
15.0000 mL | Freq: Two times a day (BID) | OROMUCOSAL | Status: DC
  Administered 2018-11-12 – 2018-11-14 (×3): 15 mL via OROMUCOSAL

## 2018-11-12 MED ORDER — KETAMINE HCL 50 MG/ML IJ SOLN
INTRAMUSCULAR | Status: AC
  Filled 2018-11-12: qty 10

## 2018-11-12 MED ORDER — PROPOFOL 10 MG/ML IV BOLUS
INTRAVENOUS | Status: AC
  Filled 2018-11-12: qty 40

## 2018-11-12 MED ORDER — OMEGA-3-ACID ETHYL ESTERS 1 G PO CAPS
1.0000 g | ORAL_CAPSULE | Freq: Every day | ORAL | Status: DC
  Administered 2018-11-12 – 2018-11-14 (×3): 1 g via ORAL
  Filled 2018-11-12 (×3): qty 1

## 2018-11-12 MED ORDER — LOSARTAN POTASSIUM-HCTZ 100-25 MG PO TABS: 1.0000 | ORAL_TABLET | Freq: Every day | ORAL | Status: DC

## 2018-11-12 MED ORDER — MORPHINE SULFATE (PF) 2 MG/ML IV SOLN: 2.0000 mg | INTRAVENOUS | Status: DC | PRN

## 2018-11-12 MED ORDER — DEXMEDETOMIDINE HCL 200 MCG/2ML IV SOLN
INTRAVENOUS | Status: DC | PRN
  Administered 2018-11-12 (×3): 4 ug via INTRAVENOUS
  Administered 2018-11-12: 8 ug via INTRAVENOUS

## 2018-11-12 MED ORDER — HEPARIN SODIUM (PORCINE) 1000 UNIT/ML IJ SOLN
INTRAMUSCULAR | Status: DC | PRN
  Administered 2018-11-12: 2000 [IU] via INTRAVENOUS
  Administered 2018-11-12: 6000 [IU] via INTRAVENOUS

## 2018-11-12 MED ORDER — PHENYLEPHRINE HCL 10 MG/ML IJ SOLN
INTRAMUSCULAR | Status: AC
  Filled 2018-11-12: qty 1

## 2018-11-12 MED ORDER — CLOPIDOGREL BISULFATE 75 MG PO TABS
75.0000 mg | ORAL_TABLET | Freq: Every day | ORAL | Status: DC
  Administered 2018-11-12 – 2018-11-14 (×3): 75 mg via ORAL
  Filled 2018-11-12 (×3): qty 1

## 2018-11-12 MED ORDER — VITAMIN B-12 1000 MCG PO TABS
1000.0000 ug | ORAL_TABLET | Freq: Every day | ORAL | Status: DC
  Administered 2018-11-12 – 2018-11-14 (×3): 1000 ug via ORAL
  Filled 2018-11-12 (×3): qty 1

## 2018-11-12 MED ORDER — SODIUM CHLORIDE 0.9 % IV SOLN: 500.0000 mL | Freq: Once | INTRAVENOUS | Status: DC | PRN

## 2018-11-12 MED ORDER — HEPARIN SODIUM (PORCINE) 5000 UNIT/ML IJ SOLN
INTRAMUSCULAR | Status: AC
  Filled 2018-11-12: qty 1

## 2018-11-12 MED ORDER — INSULIN ASPART 100 UNIT/ML ~~LOC~~ SOLN
0.0000 [IU] | Freq: Three times a day (TID) | SUBCUTANEOUS | Status: DC
  Administered 2018-11-13 (×2): 3 [IU] via SUBCUTANEOUS
  Administered 2018-11-13 – 2018-11-14 (×2): 5 [IU] via SUBCUTANEOUS
  Administered 2018-11-14: 3 [IU] via SUBCUTANEOUS
  Filled 2018-11-12 (×5): qty 1

## 2018-11-12 MED ORDER — VANCOMYCIN HCL IN DEXTROSE 1-5 GM/200ML-% IV SOLN
1000.0000 mg | Freq: Two times a day (BID) | INTRAVENOUS | Status: AC
  Administered 2018-11-12 – 2018-11-13 (×3): 1000 mg via INTRAVENOUS
  Filled 2018-11-12 (×4): qty 200

## 2018-11-12 MED ORDER — CALCIUM CARBONATE ANTACID 500 MG PO CHEW
500.0000 mg | CHEWABLE_TABLET | Freq: Two times a day (BID) | ORAL | Status: DC
  Administered 2018-11-14: 500 mg via ORAL
  Filled 2018-11-12 (×2): qty 3

## 2018-11-12 MED ORDER — EVICEL 5 ML EX KIT
PACK | CUTANEOUS | Status: AC
  Filled 2018-11-12: qty 1

## 2018-11-12 MED ORDER — ONDANSETRON HCL 4 MG/2ML IJ SOLN
INTRAMUSCULAR | Status: AC
  Filled 2018-11-12: qty 2

## 2018-11-12 MED ORDER — VITAMIN E 180 MG (400 UNIT) PO CAPS
400.0000 [IU] | ORAL_CAPSULE | Freq: Every day | ORAL | Status: DC
  Administered 2018-11-12 – 2018-11-14 (×3): 400 [IU] via ORAL
  Filled 2018-11-12 (×3): qty 1

## 2018-11-12 MED ORDER — DEXAMETHASONE SODIUM PHOSPHATE 10 MG/ML IJ SOLN
INTRAMUSCULAR | Status: DC | PRN
  Administered 2018-11-12: 5 mg via INTRAVENOUS

## 2018-11-12 MED ORDER — ENOXAPARIN SODIUM 40 MG/0.4ML ~~LOC~~ SOLN
40.0000 mg | SUBCUTANEOUS | Status: DC
  Administered 2018-11-13 – 2018-11-14 (×2): 40 mg via SUBCUTANEOUS
  Filled 2018-11-12 (×2): qty 0.4

## 2018-11-12 MED ORDER — ROCURONIUM BROMIDE 50 MG/5ML IV SOLN
INTRAVENOUS | Status: AC
  Filled 2018-11-12: qty 1

## 2018-11-12 MED ORDER — DIPHENHYDRAMINE HCL 50 MG/ML IJ SOLN
INTRAMUSCULAR | Status: DC | PRN
  Administered 2018-11-12: 25 mg via INTRAVENOUS

## 2018-11-12 MED ORDER — FENTANYL CITRATE (PF) 100 MCG/2ML IJ SOLN
INTRAMUSCULAR | Status: DC | PRN
  Administered 2018-11-12 (×2): 50 ug via INTRAVENOUS

## 2018-11-12 MED ORDER — LABETALOL HCL 5 MG/ML IV SOLN: 10.0000 mg | INTRAVENOUS | Status: DC | PRN

## 2018-11-12 MED ORDER — LOSARTAN POTASSIUM 50 MG PO TABS
100.0000 mg | ORAL_TABLET | Freq: Every day | ORAL | Status: DC
  Administered 2018-11-12 – 2018-11-14 (×3): 100 mg via ORAL
  Filled 2018-11-12: qty 2
  Filled 2018-11-12 (×2): qty 4

## 2018-11-12 MED ORDER — FAMOTIDINE 20 MG PO TABS
ORAL_TABLET | ORAL | Status: AC
  Administered 2018-11-12: 20 mg via ORAL
  Filled 2018-11-12: qty 1

## 2018-11-12 MED ORDER — FAMOTIDINE 20 MG PO TABS
20.0000 mg | ORAL_TABLET | Freq: Once | ORAL | Status: AC
  Administered 2018-11-12: 20 mg via ORAL

## 2018-11-12 MED ORDER — SUGAMMADEX SODIUM 200 MG/2ML IV SOLN
INTRAVENOUS | Status: AC
  Filled 2018-11-12: qty 2

## 2018-11-12 MED ORDER — PROPOFOL 10 MG/ML IV BOLUS
INTRAVENOUS | Status: DC | PRN
  Administered 2018-11-12: 150 mg via INTRAVENOUS

## 2018-11-12 MED ORDER — LORAZEPAM 2 MG/ML IJ SOLN
INTRAMUSCULAR | Status: AC
  Filled 2018-11-12: qty 1

## 2018-11-12 MED ORDER — LIDOCAINE HCL (PF) 2 % IJ SOLN
INTRAMUSCULAR | Status: AC
  Filled 2018-11-12: qty 10

## 2018-11-12 MED ORDER — TURMERIC 500 MG PO CAPS: ORAL_CAPSULE | Freq: Every day | ORAL | Status: DC

## 2018-11-12 MED ORDER — ACETAMINOPHEN 650 MG RE SUPP: 325.0000 mg | RECTAL | Status: DC | PRN

## 2018-11-12 MED ORDER — TRAZODONE HCL 50 MG PO TABS
50.0000 mg | ORAL_TABLET | Freq: Every day | ORAL | Status: DC
  Administered 2018-11-12: 25 mg via ORAL
  Administered 2018-11-13: 50 mg via ORAL
  Filled 2018-11-12 (×2): qty 1

## 2018-11-12 MED ORDER — VANCOMYCIN HCL IN DEXTROSE 1-5 GM/200ML-% IV SOLN
INTRAVENOUS | Status: AC
  Administered 2018-11-12: 1000 mg via INTRAVENOUS
  Filled 2018-11-12: qty 200

## 2018-11-12 MED ORDER — SORBITOL 70 % SOLN
30.0000 mL | Freq: Every day | Status: DC | PRN
  Filled 2018-11-12: qty 30

## 2018-11-12 MED ORDER — HEPARIN SODIUM (PORCINE) 1000 UNIT/ML IJ SOLN
INTRAMUSCULAR | Status: AC
  Filled 2018-11-12: qty 1

## 2018-11-12 MED ORDER — EPHEDRINE SULFATE 50 MG/ML IJ SOLN
INTRAMUSCULAR | Status: AC
  Filled 2018-11-12: qty 1

## 2018-11-12 MED ORDER — PROPOFOL 500 MG/50ML IV EMUL
INTRAVENOUS | Status: DC | PRN
  Administered 2018-11-12: 100 ug/kg/min via INTRAVENOUS

## 2018-11-12 MED ORDER — FENTANYL CITRATE (PF) 100 MCG/2ML IJ SOLN: 25.0000 ug | INTRAMUSCULAR | Status: DC | PRN

## 2018-11-12 MED ORDER — HYDROCHLOROTHIAZIDE 25 MG PO TABS
25.0000 mg | ORAL_TABLET | Freq: Every day | ORAL | Status: DC
  Administered 2018-11-14: 10:00:00 25 mg via ORAL
  Filled 2018-11-12 (×3): qty 1

## 2018-11-12 MED ORDER — SODIUM CHLORIDE 0.9 % IV SOLN
INTRAVENOUS | Status: DC | PRN
  Administered 2018-11-12: 30 ug/min via INTRAVENOUS

## 2018-11-12 MED ORDER — OXYCODONE-ACETAMINOPHEN 5-325 MG PO TABS
1.0000 | ORAL_TABLET | ORAL | Status: DC | PRN
  Administered 2018-11-14: 1 via ORAL
  Filled 2018-11-12: qty 1

## 2018-11-12 MED ORDER — SODIUM CHLORIDE 0.9 % IV SOLN
INTRAVENOUS | Status: DC | PRN
  Administered 2018-11-12: 50 mL via INTRAMUSCULAR

## 2018-11-12 MED ORDER — METOPROLOL SUCCINATE ER 50 MG PO TB24
100.0000 mg | ORAL_TABLET | Freq: Every day | ORAL | Status: DC
  Administered 2018-11-13 – 2018-11-14 (×2): 100 mg via ORAL
  Filled 2018-11-12: qty 2
  Filled 2018-11-12: qty 4

## 2018-11-12 MED ORDER — ONDANSETRON HCL 4 MG/2ML IJ SOLN
INTRAMUSCULAR | Status: DC | PRN
  Administered 2018-11-12 (×2): 4 mg via INTRAVENOUS

## 2018-11-12 MED ORDER — CEFAZOLIN SODIUM-DEXTROSE 2-4 GM/100ML-% IV SOLN
INTRAVENOUS | Status: AC
  Filled 2018-11-12: qty 100

## 2018-11-12 MED ORDER — DIPHENHYDRAMINE HCL 50 MG/ML IJ SOLN
INTRAMUSCULAR | Status: AC
  Filled 2018-11-12: qty 1

## 2018-11-12 MED ORDER — KETAMINE HCL 10 MG/ML IJ SOLN
INTRAMUSCULAR | Status: DC | PRN
  Administered 2018-11-12: 30 mg via INTRAVENOUS
  Administered 2018-11-12 (×2): 10 mg via INTRAVENOUS

## 2018-11-12 MED ORDER — INSULIN ASPART 100 UNIT/ML ~~LOC~~ SOLN
0.0000 [IU] | Freq: Three times a day (TID) | SUBCUTANEOUS | Status: DC
  Administered 2018-11-12: 5 [IU] via SUBCUTANEOUS
  Filled 2018-11-12: qty 1

## 2018-11-12 MED ORDER — ONDANSETRON HCL 4 MG/2ML IJ SOLN: 4.0000 mg | Freq: Four times a day (QID) | INTRAMUSCULAR | Status: DC | PRN

## 2018-11-12 MED ORDER — PHENOL 1.4 % MT LIQD
1.0000 | OROMUCOSAL | Status: DC | PRN
  Filled 2018-11-12: qty 177

## 2018-11-12 MED ORDER — AMLODIPINE BESYLATE 5 MG PO TABS
5.0000 mg | ORAL_TABLET | Freq: Every day | ORAL | Status: DC
  Administered 2018-11-13 – 2018-11-14 (×2): 5 mg via ORAL
  Filled 2018-11-12 (×2): qty 1

## 2018-11-12 MED ORDER — NITROGLYCERIN IN D5W 200-5 MCG/ML-% IV SOLN: 5.0000 ug/min | INTRAVENOUS | Status: DC

## 2018-11-12 MED ORDER — SUCCINYLCHOLINE CHLORIDE 20 MG/ML IJ SOLN
INTRAMUSCULAR | Status: AC
  Filled 2018-11-12: qty 1

## 2018-11-12 MED ORDER — MAGNESIUM SULFATE 2 GM/50ML IV SOLN: 2.0000 g | Freq: Every day | INTRAVENOUS | Status: DC | PRN

## 2018-11-12 MED ORDER — ALUM & MAG HYDROXIDE-SIMETH 200-200-20 MG/5ML PO SUSP
15.0000 mL | ORAL | Status: DC | PRN
  Filled 2018-11-12: qty 30

## 2018-11-12 MED ORDER — DEXAMETHASONE SODIUM PHOSPHATE 10 MG/ML IJ SOLN
INTRAMUSCULAR | Status: AC
  Filled 2018-11-12: qty 1

## 2018-11-12 MED ORDER — POTASSIUM CHLORIDE CRYS ER 20 MEQ PO TBCR: 20.0000 meq | EXTENDED_RELEASE_TABLET | Freq: Every day | ORAL | Status: DC | PRN

## 2018-11-12 MED ORDER — VANCOMYCIN HCL IN DEXTROSE 1-5 GM/200ML-% IV SOLN
1000.0000 mg | Freq: Once | INTRAVENOUS | Status: AC
  Administered 2018-11-12: 1000 mg via INTRAVENOUS

## 2018-11-12 MED ORDER — GUAIFENESIN-DM 100-10 MG/5ML PO SYRP
15.0000 mL | ORAL_SOLUTION | ORAL | Status: DC | PRN
  Filled 2018-11-12: qty 15

## 2018-11-12 MED ORDER — INSULIN ASPART 100 UNIT/ML ~~LOC~~ SOLN
0.0000 [IU] | Freq: Every day | SUBCUTANEOUS | Status: DC
  Administered 2018-11-12: 3 [IU] via SUBCUTANEOUS
  Administered 2018-11-13: 23:00:00 2 [IU] via SUBCUTANEOUS
  Filled 2018-11-12 (×2): qty 1

## 2018-11-12 MED ORDER — SODIUM CHLORIDE 0.9 % IV SOLN
INTRAVENOUS | Status: DC
  Administered 2018-11-12 (×2): via INTRAVENOUS

## 2018-11-12 MED ORDER — PROPOFOL 500 MG/50ML IV EMUL
INTRAVENOUS | Status: AC
  Filled 2018-11-12: qty 200

## 2018-11-12 MED ORDER — METOPROLOL TARTRATE 5 MG/5ML IV SOLN: 2.0000 mg | INTRAVENOUS | Status: DC | PRN

## 2018-11-12 MED ORDER — ACETAMINOPHEN 325 MG PO TABS
325.0000 mg | ORAL_TABLET | ORAL | Status: DC | PRN
  Administered 2018-11-13 (×2): 650 mg via ORAL
  Filled 2018-11-12 (×2): qty 2

## 2018-11-12 MED ORDER — MIDAZOLAM HCL 2 MG/2ML IJ SOLN
INTRAMUSCULAR | Status: AC
  Filled 2018-11-12: qty 2

## 2018-11-12 MED ORDER — PHENYLEPHRINE HCL 10 MG/ML IJ SOLN
INTRAMUSCULAR | Status: DC | PRN
  Administered 2018-11-12: 100 ug via INTRAVENOUS
  Administered 2018-11-12: 50 ug via INTRAVENOUS
  Administered 2018-11-12: 100 ug via INTRAVENOUS
  Administered 2018-11-12: 50 ug via INTRAVENOUS
  Administered 2018-11-12 (×2): 100 ug via INTRAVENOUS
  Administered 2018-11-12: 50 ug via INTRAVENOUS
  Administered 2018-11-12: 100 ug via INTRAVENOUS
  Administered 2018-11-12: 50 ug via INTRAVENOUS

## 2018-11-12 MED ORDER — ROCURONIUM BROMIDE 100 MG/10ML IV SOLN
INTRAVENOUS | Status: DC | PRN
  Administered 2018-11-12 (×5): 20 mg via INTRAVENOUS

## 2018-11-12 MED ORDER — SODIUM CHLORIDE 0.9 % IV SOLN
INTRAVENOUS | Status: AC
  Administered 2018-11-12: 15:00:00 via INTRAVENOUS

## 2018-11-12 MED ORDER — CYCLOBENZAPRINE HCL 10 MG PO TABS
10.0000 mg | ORAL_TABLET | Freq: Three times a day (TID) | ORAL | Status: DC | PRN
  Administered 2018-11-13: 10 mg via ORAL
  Filled 2018-11-12 (×2): qty 1

## 2018-11-12 SURGICAL SUPPLY — 85 items
APPLIER CLIP 11 MED OPEN (CLIP)
APPLIER CLIP 9.375 SM OPEN (CLIP)
BAG COUNTER SPONGE EZ (MISCELLANEOUS) ×1 IMPLANT
BAG DECANTER FOR FLEXI CONT (MISCELLANEOUS) ×3 IMPLANT
BALLN DORADO 7X60X80 (BALLOONS) ×3
BALLOON DORADO 7X60X80 (BALLOONS) IMPLANT
BLADE SURG 15 STRL LF DISP TIS (BLADE) ×1 IMPLANT
BLADE SURG 15 STRL SS (BLADE) ×2
BLADE SURG SZ11 CARB STEEL (BLADE) ×3 IMPLANT
BOOT SUTURE AID YELLOW STND (SUTURE) ×4 IMPLANT
BRUSH SCRUB EZ  4% CHG (MISCELLANEOUS) ×2
BRUSH SCRUB EZ 4% CHG (MISCELLANEOUS) ×1 IMPLANT
CANISTER SUCT 1200ML W/VALVE (MISCELLANEOUS) ×3 IMPLANT
CATH BEACON 5 .035 40 KMP TP (CATHETERS) IMPLANT
CATH BEACON 5 .038 40 KMP TP (CATHETERS) ×2
CLIP APPLIE 11 MED OPEN (CLIP) IMPLANT
CLIP APPLIE 9.375 SM OPEN (CLIP) IMPLANT
COUNTER SPONGE BAG EZ (MISCELLANEOUS)
COVER WAND RF STERILE (DRAPES) ×1 IMPLANT
DERMABOND ADVANCED (GAUZE/BANDAGES/DRESSINGS) ×4
DERMABOND ADVANCED .7 DNX12 (GAUZE/BANDAGES/DRESSINGS) ×1 IMPLANT
DEVICE PRESTO INFLATION (MISCELLANEOUS) ×4 IMPLANT
DEVICE TORQUE .025-.038 (MISCELLANEOUS) ×2 IMPLANT
DRAPE C-ARM XRAY 36X54 (DRAPES) ×2 IMPLANT
DRAPE INCISE IOBAN 66X45 STRL (DRAPES) ×3 IMPLANT
DRESSING SURGICEL FIBRLLR 1X2 (HEMOSTASIS) ×1 IMPLANT
DRSG OPSITE POSTOP 4X8 (GAUZE/BANDAGES/DRESSINGS) ×4 IMPLANT
DRSG SURGICEL FIBRILLAR 1X2 (HEMOSTASIS) ×6
DURAPREP 26ML APPLICATOR (WOUND CARE) ×5 IMPLANT
ELECT CAUTERY BLADE 6.4 (BLADE) ×3 IMPLANT
ELECT REM PT RETURN 9FT ADLT (ELECTROSURGICAL) ×3
ELECTRODE REM PT RTRN 9FT ADLT (ELECTROSURGICAL) ×1 IMPLANT
GLOVE BIO SURGEON STRL SZ7 (GLOVE) ×3 IMPLANT
GLOVE INDICATOR 7.5 STRL GRN (GLOVE) ×3 IMPLANT
GLOVE SURG SYN 8.0 (GLOVE) ×6 IMPLANT
GLOVE SURG SYN 8.0 PF PI (GLOVE) ×1 IMPLANT
GOWN STRL REUS W/ TWL LRG LVL3 (GOWN DISPOSABLE) ×2 IMPLANT
GOWN STRL REUS W/ TWL XL LVL3 (GOWN DISPOSABLE) ×1 IMPLANT
GOWN STRL REUS W/TWL LRG LVL3 (GOWN DISPOSABLE) ×4
GOWN STRL REUS W/TWL XL LVL3 (GOWN DISPOSABLE) ×8
IV NS 500ML (IV SOLUTION) ×2
IV NS 500ML BAXH (IV SOLUTION) ×1 IMPLANT
KIT TURNOVER KIT A (KITS) ×3 IMPLANT
LABEL OR SOLS (LABEL) ×3 IMPLANT
LOOP RED MAXI  1X406MM (MISCELLANEOUS) ×6
LOOP VESSEL MAXI 1X406 RED (MISCELLANEOUS) ×2 IMPLANT
LOOP VESSEL MINI 0.8X406 BLUE (MISCELLANEOUS) ×2 IMPLANT
LOOPS BLUE MINI 0.8X406MM (MISCELLANEOUS) ×6
NDL ENTRY 21GA 7CM ECHOTIP (NEEDLE) IMPLANT
NDL HYPO 18GX1.5 BLUNT FILL (NEEDLE) ×1 IMPLANT
NEEDLE ENTRY 21GA 7CM ECHOTIP (NEEDLE) ×3 IMPLANT
NEEDLE HYPO 18GX1.5 BLUNT FILL (NEEDLE) ×3 IMPLANT
NS IRRIG 500ML POUR BTL (IV SOLUTION) ×3 IMPLANT
PACK ANGIOGRAPHY (CUSTOM PROCEDURE TRAY) ×2 IMPLANT
PACK BASIN MAJOR ARMC (MISCELLANEOUS) ×3 IMPLANT
PACK UNIVERSAL (MISCELLANEOUS) ×3 IMPLANT
PATCH CAROTID ECM VASC 1X10 (Prosthesis & Implant Heart) ×4 IMPLANT
SET INTRO CAPELLA COAXIAL (SET/KITS/TRAYS/PACK) ×2 IMPLANT
SHEATH BRITE TIP 7FRX11 (SHEATH) ×6 IMPLANT
SHEATH BRITE TIP 8FRX11 (SHEATH) ×2 IMPLANT
STENT LIFESTAR 9X40 (Permanent Stent) ×2 IMPLANT
STENT LIFESTREAM 7X16X80 (Permanent Stent) ×2 IMPLANT
STENT LIFESTREAM 7X37X80 (Permanent Stent) ×4 IMPLANT
STENT LIFESTREAM 8X58X80 (Permanent Stent) ×4 IMPLANT
SUT MNCRL+ 5-0 UNDYED PC-3 (SUTURE) ×1 IMPLANT
SUT MONOCRYL 5-0 (SUTURE) ×4
SUT PROLENE 5 0 RB 1 DA (SUTURE) ×8 IMPLANT
SUT PROLENE 6 0 BV (SUTURE) ×32 IMPLANT
SUT PROLENE 7 0 BV 1 (SUTURE) ×2 IMPLANT
SUT SILK 2 0 (SUTURE) ×4
SUT SILK 2-0 18XBRD TIE 12 (SUTURE) ×1 IMPLANT
SUT SILK 3 0 (SUTURE) ×2
SUT SILK 3-0 18XBRD TIE 12 (SUTURE) ×1 IMPLANT
SUT SILK 4 0 (SUTURE) ×2
SUT SILK 4-0 18XBRD TIE 12 (SUTURE) ×1 IMPLANT
SUT VIC AB 0 CT1 36 (SUTURE) ×3 IMPLANT
SUT VIC AB 2-0 CT1 27 (SUTURE) ×8
SUT VIC AB 2-0 CT1 TAPERPNT 27 (SUTURE) ×2 IMPLANT
SUT VIC AB 3-0 SH 27 (SUTURE)
SUT VIC AB 3-0 SH 27X BRD (SUTURE) ×1 IMPLANT
SUT VICRYL+ 3-0 36IN CT-1 (SUTURE) ×6 IMPLANT
SYR 20CC LL (SYRINGE) ×3 IMPLANT
SYR 5ML LL (SYRINGE) ×3 IMPLANT
TRAY FOLEY MTR SLVR 16FR STAT (SET/KITS/TRAYS/PACK) ×3 IMPLANT
WIRE MAGIC TOR.035 180C (WIRE) ×4 IMPLANT

## 2018-11-12 NOTE — Progress Notes (Signed)
PHARMACIST - PHYSICIAN ORDER COMMUNICATION ° °CONCERNING: P&T Medication Policy on Herbal Medications ° °DESCRIPTION:  This patient’s order for:  Turmeric  has been noted. ° °This product(s) is classified as an “herbal” or natural product. °Due to a lack of definitive safety studies or FDA approval, nonstandard manufacturing practices, plus the potential risk of unknown drug-drug interactions while on inpatient medications, the Pharmacy and Therapeutics Committee does not permit the use of “herbal” or natural products of this type within [place]. °  °ACTION TAKEN: °The pharmacy department is unable to verify this order at this time and your patient has been informed of this safety policy. °Please reevaluate patient’s clinical condition at discharge and address if the herbal or natural product(s) should be resumed at that time. ° °

## 2018-11-12 NOTE — Progress Notes (Signed)
Ch met w/ pt and daughter before surgery while being informed on how the anesthesia will be administered to her. Ch provided compassionate presence and prayed w/ the pt and daughter. Daughter informed ch that pt will be admitted post-op. F/u recommended.

## 2018-11-12 NOTE — Anesthesia Post-op Follow-up Note (Signed)
Anesthesia QCDR form completed.        

## 2018-11-12 NOTE — Anesthesia Preprocedure Evaluation (Signed)
Anesthesia Evaluation  Patient identified by MRN, date of birth, ID band Patient awake    Reviewed: Allergy & Precautions, H&P , NPO status , Patient's Chart, lab work & pertinent test results  History of Anesthesia Complications (+) PONV and history of anesthetic complications  Airway Mallampati: III  TM Distance: <3 FB Neck ROM: full    Dental  (+) Chipped, Poor Dentition, Missing, Upper Dentures   Pulmonary neg pulmonary ROS, neg shortness of breath, former smoker,           Cardiovascular Exercise Tolerance: Good hypertension, (-) angina+ CAD, + Peripheral Vascular Disease and +CHF  (-) Past MI      Neuro/Psych negative neurological ROS  negative psych ROS   GI/Hepatic negative GI ROS, Neg liver ROS, neg GERD  ,  Endo/Other  diabetes, Type 2  Renal/GU negative Renal ROS  negative genitourinary   Musculoskeletal   Abdominal   Peds  Hematology negative hematology ROS (+)   Anesthesia Other Findings Past Medical History: No date: CHF (congestive heart failure) (HCC) No date: Chicken pox No date: Colon polyps No date: Complication of anesthesia No date: Coronary artery disease 1986: Cystitis 2001: Diabetes mellitus without complication ([place]) No date: Diverticulosis 2008: Heart disease No date: Hyperlipidemia 2003: Hypertension No date: PAD (peripheral artery disease) (HCC) No date: PONV (postoperative nausea and vomiting) No date: Rotator cuff tendinitis 2016: Squamous cell skin cancer  Past Surgical History: 1989: ABDOMINAL HYSTERECTOMY 2014: BREAST BIOPSY; Left     Comment:  neg No date: CARDIAC CATHETERIZATION     Comment:  cornary stent 1971, 1975: [place]: CHOLECYSTECTOMY No date: COLONOSCOPY 2008: CORONARY ANGIOPLASTY     Comment:  stent x1 04/12/2018: JOINT REPLACEMENT; Right 08/05/2018: LOWER EXTREMITY ANGIOGRAPHY; Left     Comment:  Procedure: LOWER EXTREMITY ANGIOGRAPHY;   Surgeon:               Katha Cabal, MD;  Location: [facility];               Service: Cardiovascular;  Laterality: Left; 04/08/2018: REVERSE SHOULDER ARTHROPLASTY; Right     Comment:  Procedure: REVERSE SHOULDER ARTHROPLASTY;  Surgeon:               Corky Mull, MD;  Location: [facility];  Service:               Orthopedics;  Laterality: Right; No date: TUBAL LIGATION  BMI    Body Mass Index:  34.27 kg/m      Reproductive/Obstetrics negative OB ROS                             Anesthesia Physical  Anesthesia Plan  ASA: III  Anesthesia Plan: General   Post-op Pain Management:    Induction: Intravenous  PONV Risk Score and Plan: 4 or greater and Propofol infusion, TIVA, Ondansetron, Dexamethasone and Treatment may vary due to age or medical condition  Airway Management Planned: Oral ETT  Additional Equipment:   Intra-op Plan:   Post-operative Plan: Extubation in OR  Informed Consent: I have reviewed the patients History and Physical, chart, labs and discussed the procedure including the risks, benefits and alternatives for the proposed anesthesia with the patient or authorized representative who has indicated his/her understanding and acceptance.     Dental Advisory Given  Plan Discussed with: Anesthesiologist, CRNA and Surgeon  Anesthesia Plan Comments: (Patient consented for risks of anesthesia including  but not limited to:  - adverse reactions to medications - risk of intubation if required - damage to teeth, lips or other oral mucosa - sore throat or hoarseness - Damage to heart, brain, lungs or loss of life  Patient voiced understanding.)        Anesthesia Quick Evaluation

## 2018-11-12 NOTE — Progress Notes (Signed)
PHARMACIST - PHYSICIAN COMMUNICATION  CONCERNING:  Enoxaparin (Lovenox) for DVT Prophylaxis    RECOMMENDATION: Patient was prescribed enoxaprin 30mg  q24 hours for VTE prophylaxis.   Filed Weights   11/12/18 1404  Weight: 184 lb 8.4 oz (83.7 kg)    Body mass index is 37.27 kg/m.  Estimated Creatinine Clearance: 61.4 mL/min (by C-G formula based on SCr of 0.72 mg/dL).  Patient is candidate for enoxaparin 40mg  every 24 hours based on CrCl >52ml/min   DESCRIPTION: Pharmacy has adjusted enoxaparin dose per [facility] policy.  Patient is now receiving enoxaparin 40mg  every 24 hours.   Lu Duffel, PharmD, BCPS Clinical Pharmacist 11/12/2018 4:03 PM

## 2018-11-12 NOTE — Op Note (Addendum)
OPERATIVE NOTE   PROCEDURE: 1. Bilateral common femoral and superficial femoral with left profunda femoris endarterectomy with Cormatrix patch angioplasty. 2. Bilateral external iliac artery endarterectomies 3. Open angioplasty and stent placement bilateral common iliac arteries using 8 mm x 58 mm lifestream stents. 4. Open angioplasty and stent placement right external iliac artery with a 7 mm x 37 mm lifestream stent. 5. Open angioplasty and stent placement left external iliac artery using 3 stents, a 9 mm x 40 mm life star stent followed by a 7 mm x 37 mm lifestream stent with a 7 mm x 16 mm lifestream stent.  PRE-OPERATIVE DIAGNOSIS: Atherosclerotic occlusive disease bilateral lower extremities with lifestyle limiting claudication and rest pain symptoms; hypertension; diabetes mellitus  POST-OPERATIVE DIAGNOSIS: Same  CO-SURGEON: Katha Cabal, MD and Algernon Huxley, M.D.  ASSISTANT(S): None  ANESTHESIA: general  ESTIMATED BLOOD LOSS: 300 cc  FINDING(S): 1. Profound calcific plaque noted bilaterally extending throughout the common femoral and SFA with extension into the left profunda femoris artery.  Calcific plaque from the bilateral external iliac arteries is also sent as specimen  SPECIMEN(S):  Calcific plaque from the common femoral, superficial femoral and the left profunda femoris arteries as well as the external iliac arteries bilaterally  INDICATIONS:   Christina Blackburn 73 y.o. y.o.female who presents with complaints of lifestyle limiting claudication and pain continuously in the feet bilaterally. The patient has documented severe atherosclerotic occlusive disease and has undergone multiple minimally invasive treatments in the past. However, at this point his primary area of stricture stenosis resides in the common femoral and origins of the superficial femoral and profunda femoris extending into these arteries and therefore this is not amenable to intervention  and he is now undergoing open endarterectomy. The risks and benefits of been reviewed with the patient, all questions have answered; alternative therapies have been reviewed as well and the patient has agreed to proceed with surgical open repair.  DESCRIPTION: After obtaining full informed written consent, the patient was brought back to the operating room and placed supine upon the operating table.  The patient received IV antibiotics prior to induction.  After obtaining adequate anesthesia, the patient was prepped and draped in the standard fashion for: bilateral femoral exposure.  Co-surgeons are required because this is a bilateral procedure with work being performed simultaneously from both the right femoral and left femoral approach.  This also expedite the procedure making a shorter operative time reducing complications and improving patient safety.  Attention was turned to the bilateral groin with Dr. Lucky Cowboy working on the right and myself working on the left of the patient.  Vertical  incisions were made over the common femoral artery and dissected down to the common femoral artery with electrocautery.  I dissected out the common femoral artery from the distal external iliac artery (identified by the superficial circumflex vessels) down to the femoral bifurcation.  On initial inspection, the common femoral artery was densely calcified and there was no palpable pulse noted bilaterally.    Subsequently the dissection was continued to include all circumflex branches and the profunda femoral artery and superficial femoral artery. The superficial femoral artery was dissected circumferentially for a distance of approximately 3 cm and the profunda femoris was dissected circumferentially out to the fourth order branches individual vessel loops were placed around each branch.   Both of the groins were treated in this same fashion as described above. Control of all branches was obtained with vessel loops.  A  softer area in the distal external iliac artery amendable to clamping was not identified at the distal end of the iliac arteries.  This necessitated further dissection more proximally.  We selected the right groin first.  The inferior margin of the ileal inguinal ligament was then incised and with myself holding a combination of a Richardson retractor as well as an Copy to assist in reflecting the right ileal inguinal ligament Dr. Lucky Cowboy extended the circumferential dissection of the right external iliac artery for a distance of approximately 4 cm nearly to the midportion of the external iliac.  This area allowed for vascular control and a Satinsky clamp was positioned at this location.  We then addressed the left side in a similar fashion.  The inferior margin of the ileal inguinal ligament was then incised and with Dr Lucky Cowboy holding a combination of a Metallurgist as well as an Copy to assist in reflecting the right ileal inguinal ligament I extended the circumferential dissection of the right external iliac artery for a distance of approximately 4 cm nearly to the midportion of the external iliac.  This area allowed for vascular control and a Satinsky clamp was positioned at this location.  The patient was given 6000 units of Heparin intravenously (at a later point in the case the patient was given additional heparin bolus of 2000 units), which was a therapeutic bolus.     Again the right groin was treated first with Dr. Lucky Cowboy and I working together and then the left groin was treated in the same fashion as described below with Dr. Dr. Lucky Cowboy and I working together.  After waiting 3 minutes, the distal external iliac artery was clamped and placed all circumflex branches, and the profunda and superficial femoral arteries under tension.  Arteriotomy was made in the common femoral artery with a 11-blade and extended it with a Potts scissor proximally and distally extending the distal  end down the SFA for approximately 3 cm.   Endarterectomy was then performed under direct visualization using a freer elevator and a right angle from the mid common femoral extending up both proximally and distally. Proximally the endarterectomy was brought up to the level of the clamp performing an extensive eversion endarterectomy of the distal 3 to 4 cm of the external iliac artery to a point where a clean edge was obtained. Distally the endarterectomy was carried down to a soft spot in the SFA where a feathered edge was obtained.    The left profunda femoris was treated with an eversion technique extending endarterectomy approximately 2 cm distally again obtaining a featheredge on the left side.  On the right the profunda femoris did not need endarterectomy.   At this point, I fashioned a core matrix patch for the geometry of the arteriotomy.  The patch was sewn to the artery with 2 running stitches of 6-0 Prolene, running from each end.  Prior to completing the patch angioplasty, the profunda femoral artery was flushed as was the superficial femoral artery. The system was then forward flushed. The endarterectomy site was then irrigated copiously with heparinized saline. The patch angioplasty was completed in the usual fashion.  Flow was then reestablished first to the profunda femoris and then the superficial femoral artery. Any gaps or bleeding sites in the suture line were easily controlled with a 6-0 Prolene suture.   Having completed both endarterectomies attention was now turned to the intervention of the more proximal strictures noted on her previous angiogram.  Using  a micropuncture kit access was obtained through the mid common femoral directly through the core matrix patch.  Micropuncture needle was inserted on the left first followed by the microwire then the micro-sheath the J-wire and a 7 French sheath was placed.  This was repeated on the right side again placing a 7 [place] sheath.  Under  fluoroscopic guidance Magic torque wires were advanced up both the right and left and positioned in the proximal abdominal aorta.  Hand-injection of contrast then demonstrated the aortic bifurcation and again the greater than 70% stenosis at the origin of both the right and left common iliac arteries was identified.  After appropriate measurements an 8 mm x 58 mm lifestream stent was advanced up both the right and left sides.  Again Dr. dew was working on the right side of the patient I am working on the left side of the patient.  We then inflated the balloons of the Lifestream stents successfully deploying both stents simultaneously.  Follow-up imaging now demonstrated less than 5% residual stenosis with an excellent reconstruction of the distal aorta and common iliac arteries.  Attention was then turned to the left external iliac which demonstrated a greater than 80% stenosis at the origin of the external iliac with 70% stenosis extending down to the midportion.  The endpoint for the endarterectomy was clearly visualized.  In order to prevent occluding the internal iliac on the left a 9 mm x 40 mm life star stent was deployed across the ostial lesion and postdilated with a 7 mm [place] balloon to 20 atm.  To complete the stenting in the midportion of the left external iliac artery a 7 mm x 37 mm lifestream stent was deployed overlapping the life star stent by approximately 1 cm and then a 7 mm x 16 mm life streams stent was deployed extending down to the leading edge of the endarterectomy site.  These balloon expandable stents fully deployed and follow-up imaging demonstrated excellent reconstruction of the entire length of the external iliac artery extending from the origin now down to the common femoral with a successful endarterectomy associated with the more proximal stent.  There is less than 5% residual stenosis throughout the entire segment.  Attention was then turned to the right side where angiography  was performed in the LAO projection after appropriate measurements a 7 mm x 37 mm lifestream stent was advanced into the proximal right external iliac artery and deployed without difficulty.  The previously noted 85% stenosis was now reduced to less than 5% with excellent flow.  There is now complete reconstruction of the external iliac artery with a stent proximal extending into the successful right external iliac endarterectomy.  Imaging of the bilateral common femorals was then obtained in RAO projection for the right and LAO projection for the left demonstrating a widely patent patch with widely patent profunda femoris arteries bilaterally the origin of the superficial femoral arteries are now widely patent as well.  6-0 Prolene was were then used in a pursestring fashion around the sheaths and they were removed.  Again we checked for bleeding along the suture line and several spots were identified bilaterally that were easily controlled with interrupted 6-0 Prolene sutures as well.  Once hemostasis was assured we began the closing process.  Both right and left groins were then irrigated copiously with sterile saline and subsequently Evicel and Surgicel were placed in the wound. The incision was repaired with a double layer of 2-0 Vicryl, a double layer of  3-0 Vicryl, and a layer of 4-0 Monocryl in a subcuticular fashion.  The skin was cleaned, dried, and reinforced with Dermabond.  COMPLICATIONS: None  CONDITION: Carlynn Purl, M.D. [facility] Office: [phone]  11/12/2018, 12:57 PM

## 2018-11-12 NOTE — Transfer of Care (Signed)
Immediate Anesthesia Transfer of Care Note  Patient: Christina Blackburn  Procedure(s) Performed: ENDARTERECTOMY FEMORAL (Bilateral ) INSERTION OF ILIAC STENT (Bilateral )  Patient Location: PACU  Anesthesia Type:General  Level of Consciousness: drowsy  Airway & Oxygen Therapy: Patient Spontanous Breathing and Patient connected to face mask oxygen  Post-op Assessment: Report given to RN and Post -op Vital signs reviewed and stable  Post vital signs: Reviewed and stable  Last Vitals:  Vitals Value Taken Time  BP 89/48 11/12/2018 12:57 PM  Temp 36.2 C 11/12/2018  1:01 PM  Pulse 73 11/12/2018  1:01 PM  Resp 32 11/12/2018  1:01 PM  SpO2 100 % 11/12/2018  1:01 PM  Vitals shown include unvalidated device data.  Last Pain:  Vitals:   11/12/18 1301  TempSrc: Temporal  PainSc:          Complications: No apparent anesthesia complications

## 2018-11-12 NOTE — Op Note (Signed)
OPERATIVE NOTE   PROCEDURE: 1. Left common femoral, profunda femoris, and superficial femoral artery endarterectomies and patch angioplasty to the common and superficial femoral arteries 2.   Right common femoral and superficial femoral artery endarterectomies and patch angioplasty 3.   Left external iliac artery endarterectomy 4.   Right external iliac artery endarterectomy 5.   Kissing balloon stent placements to the distal aorta and bilateral common iliac arteries using 8 mm diameter by 58 mm length lifestream stents 6.  Additional stent placement to the right proximal and mid external iliac artery with 7 mm diameter by 37 mm length lifestream stent 7.  Additional stent placement to the left external iliac artery with a 9 mm diameter by 4 cm length life star stent, a 7 mm diameter by 37 mm length lifestream stent, and a 7 mm diameter by 16 mm length lifestream stent   PRE-OPERATIVE DIAGNOSIS: 1. Atherosclerotic occlusive disease bilateral lower extremities with rest pain   POST-OPERATIVE DIAGNOSIS: Same  SURGEON: Leotis Pain, MD  CO-surgeon: [name], MD  ANESTHESIA: general  ESTIMATED BLOOD LOSS: 500 cc  FINDING(S): 1. significant plaque in bilateral common femoral and superficial femoral arteries as well as plaque in the left profunda femoris artery proximally.  Plaque that extended 2 to 3 cm into the external iliac artery on the left and 4 to 5 cm of the external iliac artery on the right that was treated surgically.  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  INDICATIONS:  Patient presents with rest pain of both feet.  Bilateral femoral endarterectomies are planned to try to improve perfusion as well as planned endovascular treatment of more proximal iliac disease that cannot be  reached surgically without an abdominal incision which would be highly morbid on this obese female with multiple medical issues. The risks and benefits as well as alternative therapies including intervention were reviewed in detail all questions were answered the patient agrees to proceed with surgery.  DESCRIPTION: After obtaining full informed written consent, the patient was brought back to the operating room and placed supine upon the operating table. The patient received IV antibiotics prior to induction. After obtaining adequate anesthesia, the patient was prepped and draped in the standard fashion appropriate time out is called.   With myself working on the right and Dr. Delana Meyer working on the left we began by dissecting out the femoral arteries on each side. Vertical incisions were created overlying both femoral arteries. The common femoral artery proximally, and superficial femoral artery, and primary profunda femoris artery branches were encircled with vessel loops and prepared for control. Both femoral arteries were found to have significant plaque from the common femoral artery into the superficial femoral arteries.  There is also more plaque proximally and extended dissection more cephalad was required particularly on the right side.  The inguinal ligament had to be split and we dissected out about 5 cm into the external iliac artery on the right above the circumflex vessels in about 3 to 4 cm into the external iliac artery on the left above the circumflex vessels.  The dissection was quite tedious given her large size and abdominal pannus.   6000 units of heparin was given and allowed circulate for 5 minutes.  Later in the procedure, an additional 2000 units of intravenous heparin were given.  Attention is then turned to the right  femoral artery. An arteriotomy is made with 11 blade and extended with Potts scissors in the common femoral artery and carried down onto the first 2-3 cm of  the superficial femoral artery. An endarterectomy was then performed. The [facility] was used to create a plane. The proximal endpoint was cut flush with tenotomy scissors. This was in the proximal common femoral artery.  There was still however significant plaque well beyond the proximal common femoral artery and above the circumflex vessels.  After freeing a plane with a Soil scientist, a hemostat was taken well into the external iliac artery and a large chunk of plaque measuring about 2 cm that was 3 to 4 cm up the external iliac artery was removed as a separate endarterectomy of the right external iliac artery.  The right profunda femoris artery did not appear to have a lot of plaque and the endarterectomy was taken just to the origin of the profunda femoris artery taking care not good to go down into the artery itself.  Control was released from the Vesseloops in the profunda femoris artery and good backbleeding was then seen. The distal endpoint of the superficial femoral artery endarterectomy was created with gentle traction and the distal endpoint was relatively clean and the superficial femoral artery about 3 cm beyond its origin. The Cormatrix patcth is then selected and prepared for a patch angioplasty.  It is cut and beveled and started at the proximal endpoint with a 6-0 Prolene suture.  Approximately one half of the suture line is run medially and laterally and the distal end point was cut and bevelled to match the arteriotomy.  A second 6-0 Prolene was started at the distal end point and run to the mid portion to complete the arteriotomy.  The vessel was flushed prior to release of control and completion of the anastomosis.  At this point, flow was established first to the profunda femoris artery and then to the superficial femoral artery. Easily palpable pulses are noted well beyond the anastomosis and both arteries.  The left femoral artery is then addressed. Arteriotomy is made in the  common femoral artery and extended down into the superficial femoral artery. Similarly, an endarterectomy was performed with the [facility]. The proximal endpoint was cut flush with tenotomy scissors in the proximal common femoral artery.  Again, there was a chunk of plaque proximal to this proximal common femoral artery endpoint.  Using a hemostat were able to get another 2 to 3 cm up the external iliac artery and remove plaque from the left side as well in the external iliac artery.  This was not as large chunk of plaque but was well into the external iliac artery.  The profunda femoris artery had a proximal plaque that was addressed and treated with an eversion endarterectomy and this was performed with a hemostat and gentle traction. The arteriotomy was carried down onto the first 3 cm or so of the superficial femoral artery and the endarterectomy was continued to this point. The distal endpoint was relatively clean. The Cormatrix extracellular patch was then brought onto the field.  It is cut and beveled and started at the proximal endpoint with a 6-0 Prolene suture.  Approximately one half of the suture line is run medially and laterally and the distal end point was cut and bevelled to match the arteriotomy.  A second 6-0 Prolene was started at the distal end point and run to the mid portion to complete the arteriotomy.   Flushing  maneuvers were performed and flow was reestablished to the femoral vessels.  We then turned our attention to treating the more proximal disease.  7 French sheaths were used to access both patches in the midportion of the patch.  Imaging was performed through the sheath demonstrating 70 to 80% stenosis in both common iliac arteries with the right being at the origin and the left being a centimeter to below the origin.  There was also about a 90% stenosis of the origin of the left external iliac artery and moderate disease below this in the 70% range.  On the right, there was a  lesion in the right mid external iliac artery in the 80% range.  Magic torque wires and Kumpe catheters were used to cross the lesions and get the wires into the aorta.  Selective imaging of the aorta and iliac arteries performed through the sheaths.  Kissing balloon expandable stent placement was then performed in both common iliac arteries taking care not to hurt the hypogastric arteries.  These were extended approximately 1 cm into the distal aorta and inflated simultaneously to 12 atm.  These were 8 mm diameter by 58 mm length lifestream stents.  There was a lesion in the proximal to mid right external iliac artery.  This was addressed with a 7 mm diameter by 37 mm length lifestream stent inflated to 10 atm.  Imaging showed this to be in the mid external iliac artery about 4 cm above the femoral head and the endpoint of our endarterectomy was just below this location.  Imaging also showed that the profunda femoris artery on the right was widely patent. On the left side, a lesion was present at the origin of the external iliac artery requiring a noncovered stent to avoid injuring the hypogastric artery.  A 9 mm diameter by 40 mm length self-expanding life star stent was deployed from the distal common iliac artery into the proximal external iliac artery on the left.  This was postdilated with a 7 mm balloon.  Completion imaging showed residual disease in the external iliac artery on the left below the stent and above the patch and endarterectomy that extended up into the external iliac artery.  For the lesions in these locations that remained greater than 50% to more lifestream stents were placed.  The first was 7 mm diameter by 37 mm in length and the second was 7 mm diameters by 16 mm in length. Following these treatments completion imaging was performed which showed less than 10% result notices in the distal aorta, bilateral common iliac arteries, and bilateral external iliac arteries down to widely patent  femoral patch angioplasties with widely patent profunda femoris arteries bilaterally.  Surgicel and Evicel topical hemostatic agents were placed in the femoral incisions and hemostasis was complete. The femoral incisions were then closed in a layered fashion with 2 layers of 2-0 Vicryl, 2 layers of 3-0 Vicryl, and 4-0 Monocryl for the skin closure. Dermabond and sterile dressing were then placed over all incisions.  The patient was then awakened from anesthesia and taken to the recovery room in stable condition having tolerated the procedure well.  COMPLICATIONS: None  CONDITION: Stable     Leotis Pain 11/12/2018 2:02 PM  This note was created with Dragon Medical transcription system. Any errors in dictation are purely unintentional.

## 2018-11-12 NOTE — H&P (Signed)
[facility] History & Physical Update  The patient was interviewed and re-examined.  The patient's previous History and Physical has been reviewed and is unchanged.  There is no change in the plan of care. We plan to proceed with the scheduled procedure.  [name], MD  11/12/2018, 7:28 AM

## 2018-11-12 NOTE — Anesthesia Procedure Notes (Signed)
Procedure Name: Intubation Date/Time: 11/12/2018 7:55 AM Performed by: Martha Clan, MD Pre-anesthesia Checklist: Patient identified, Suction available, Patient being monitored, Emergency Drugs available and Timeout performed Patient Re-evaluated:Patient Re-evaluated prior to induction Oxygen Delivery Method: Circle system utilized Preoxygenation: Pre-oxygenation with 100% oxygen Induction Type: IV induction Ventilation: Mask ventilation without difficulty Laryngoscope Size: Miller and 3 Grade View: Grade I Tube type: Oral Tube size: 7.0 mm Number of attempts: 1 Airway Equipment and Method: Stylet Placement Confirmation: ETT inserted through vocal cords under direct vision,  positive ETCO2 and breath sounds checked- equal and bilateral Secured at: 21 cm Tube secured with: Tape Dental Injury: Teeth and Oropharynx as per pre-operative assessment

## 2018-11-12 NOTE — Anesthesia Postprocedure Evaluation (Signed)
Anesthesia Post Note  Patient: Christina Blackburn  Procedure(s) Performed: ENDARTERECTOMY FEMORAL (Bilateral ) INSERTION OF ILIAC STENT (Bilateral )  Patient location during evaluation: PACU Anesthesia Type: General Level of consciousness: awake and alert Pain management: pain level controlled Vital Signs Assessment: post-procedure vital signs reviewed and stable Respiratory status: spontaneous breathing, nonlabored ventilation, respiratory function stable and patient connected to nasal cannula oxygen Cardiovascular status: blood pressure returned to baseline and stable Postop Assessment: no apparent nausea or vomiting Anesthetic complications: no     Last Vitals:  Vitals:   11/12/18 1353 11/12/18 1404  BP:  (!) 144/68  Pulse: 73 74  Resp: 19 19  Temp:  37 C  SpO2: (!) 87% 99%    Last Pain:  Vitals:   11/12/18 1404  TempSrc: Oral  PainSc:                  Martha Clan

## 2018-11-13 DIAGNOSIS — Z9889 Other specified postprocedural states: Secondary | ICD-10-CM

## 2018-11-13 DIAGNOSIS — Z95820 Peripheral vascular angioplasty status with implants and grafts: Secondary | ICD-10-CM

## 2018-11-13 DIAGNOSIS — I70223 Atherosclerosis of native arteries of extremities with rest pain, bilateral legs: Secondary | ICD-10-CM

## 2018-11-13 LAB — CBC
HEMATOCRIT: 27.4 % — AB (ref 36.0–46.0)
Hemoglobin: 9.1 g/dL — ABNORMAL LOW (ref 12.0–15.0)
MCH: 28.3 pg (ref 26.0–34.0)
MCHC: 33.2 g/dL (ref 30.0–36.0)
MCV: 85.1 fL (ref 80.0–100.0)
Platelets: 226 10*3/uL (ref 150–400)
RBC: 3.22 MIL/uL — ABNORMAL LOW (ref 3.87–5.11)
RDW: 14.3 % (ref 11.5–15.5)
WBC: 9.7 10*3/uL (ref 4.0–10.5)
nRBC: 0 % (ref 0.0–0.2)

## 2018-11-13 LAB — BASIC METABOLIC PANEL
Anion gap: 6 (ref 5–15)
BUN: 16 mg/dL (ref 8–23)
CHLORIDE: 104 mmol/L (ref 98–111)
CO2: 27 mmol/L (ref 22–32)
Calcium: 8.1 mg/dL — ABNORMAL LOW (ref 8.9–10.3)
Creatinine, Ser: 0.71 mg/dL (ref 0.44–1.00)
GFR calc Af Amer: >60 mL/min (ref >60)
GFR calc non Af Amer: >60 mL/min (ref >60)
Glucose, Bld: 195 mg/dL — ABNORMAL HIGH (ref 70–99)
Potassium: 4.1 mmol/L (ref 3.5–5.1)
Sodium: 137 mmol/L (ref 135–145)

## 2018-11-13 LAB — GLUCOSE, CAPILLARY
GLUCOSE-CAPILLARY: 177 mg/dL — AB (ref 70–99)
GLUCOSE-CAPILLARY: 233 mg/dL — AB (ref 70–99)
Glucose-Capillary: 160 mg/dL — ABNORMAL HIGH (ref 70–99)
Glucose-Capillary: 225 mg/dL — ABNORMAL HIGH (ref 70–99)

## 2018-11-13 MED ORDER — FAMOTIDINE 20 MG PO TABS
20.0000 mg | ORAL_TABLET | Freq: Two times a day (BID) | ORAL | Status: DC
  Administered 2018-11-13 – 2018-11-14 (×2): 20 mg via ORAL
  Filled 2018-11-13 (×2): qty 1

## 2018-11-13 NOTE — Progress Notes (Signed)
[facility] Daily Progress Note   Subjective: 1 Day Post-Op:  1. Bilateral common femoral and superficial femoral with left profunda femoris endarterectomy with Cormatrix patch angioplasty. 2. Bilateral external iliac artery endarterectomies 3. Open angioplasty and stent placement bilateral common iliac arteries using 8 mm x 58 mm lifestream stents. 4. Open angioplasty and stent placement right external iliac artery with a 7 mm x 37 mm lifestream stent. 5. Open angioplasty and stent placement left external iliac artery using 3 stents, a 9 mm x 40 mm life star stent followed by a 7 mm x 37 mm lifestream stent with a 7 mm x 16 mm lifestream stent.  Patient without complaint this a.m. with the exception of some bilateral incisional soreness.  No events overnight.  Objective: Vitals:   11/13/18 0500 11/13/18 0600 11/13/18 0700 11/13/18 0705  BP: (!) 107/55 (!) 108/55 (!) 91/39 (!) 123/54  Pulse: 67 73 74 79  Resp: 16 17 17 19   Temp:    98.3 F (36.8 C)  TempSrc:      SpO2: 100% 96% 97% 99%  Weight:      Height:        Intake/Output Summary (Last 24 hours) at 11/13/2018 1337 Last data filed at 11/13/2018 0605 Gross per 24 hour  Intake 1163.96 ml  Output 1050 ml  Net 113.96 ml   Physical Exam: A&Ox3, NAD CV: RRR Pulmonary: CTA Bilaterally Abdomen: Soft, Nontender, Nondistended Bilateral Groins: Or dressings intact clean and dry.  No swelling.  No drainage.  No ecchymosis. Foley: Intact draining clear urine. Vascular:  Right Lower Extremity: Thigh soft.  Calf soft.  Extremities warm down to toes.  Motor/sensory is intact. Left Lower Extremity: Thigh soft.  Calf soft.  Extremities warm down to toes.  Motor/sensory is intact.   Laboratory: CBC    Component Value Date/Time   WBC 9.7 11/12/2018 2349   HGB 9.1 (L) 11/12/2018 2349   HCT 27.4 (L) 11/12/2018 2349   PLT 226 11/12/2018 2349   BMET    Component Value Date/Time   NA 137 11/12/2018 2349   K  4.1 11/12/2018 2349   CL 104 11/12/2018 2349   CO2 27 11/12/2018 2349   GLUCOSE 195 (H) 11/12/2018 2349   BUN 16 11/12/2018 2349   CREATININE 0.71 11/12/2018 2349   CALCIUM 8.1 (L) 11/12/2018 2349   GFRNONAA >60 11/12/2018 2349   GFRAA >60 11/12/2018 2349   Assessment/Planning: The patient is a 73 year old female with a past medical history of peripheral artery disease status post bilateral femoral endarterectomies - POD#1 1) DC Foley.  Patient would like to keep Foley in as her femoral endarterectomy incisions are sore.  I explained to the patient that this places her at risk for increased infection.  A bedside commode can be brought to her room. 2) 2 g drop in hemoglobin.  Patient is asymptomatic at this time.  Follow-up a.m. labs. 3) Patient to ambulate.  Awaiting PT evaluation. 4) Asked diabetes to consult as the patient has elevated sugars 5) transfer out of the ICU to surgical floor as patient is stable  Discussed with Dr. Eber Hong Tasneem Cormier PA-C 11/13/2018 1:37 PM

## 2018-11-13 NOTE — Progress Notes (Signed)
PHARMACIST - PHYSICIAN COMMUNICATION  CONCERNING: IV to Oral Route Change Policy  RECOMMENDATION: This patient is receiving famotidine by the intravenous route.  Based on criteria approved by the Pharmacy and Therapeutics Committee, the intravenous medication(s) is/are being converted to the equivalent oral dose form(s).   DESCRIPTION: These criteria include:  The patient is eating (either orally or via tube) and/or has been taking other orally administered medications for a least 24 hours  The patient has no evidence of active gastrointestinal bleeding or impaired GI absorption (gastrectomy, short bowel, patient on TNA or NPO).  If you have questions about this conversion, please contact the Pharmacy Department   []   [phone] )  [place], [facility] 11/13/2018 9:38 AM

## 2018-11-13 NOTE — Evaluation (Signed)
Occupational Therapy Evaluation Patient Details Name: Christina Blackburn MRN: [id number] DOB: [birth year] Today's Date: 11/13/2018    History of Present Illness Pt is a 73 yo female diagnosed with atherosclerotic occlusive disease bilateral lower extremities with rest pain and is now s/p extensive BLE revascularization.  PHM includes: HTN, CAD, PVD, CHF, DM II, CHF, HLD, PAD, skin CA, and right reverse TSA.     Clinical Impression   Pt seen for OT evaluation this date, POD1. Prior to hospital admission, pt was independent, however experiencing significant BLE pain, limiting her ADL and mobility.  Pt lives by herself and is eager to return to PLOF with less pain and improved functional independence. Pt reports having stocked up on groceries and has family nearby who can assist as needed upon her return home. Currently pt demonstrates impairments in incision site pain and BLE strength requiring MIN assist for LB ADL and CGA for functional mobility using a RW. Pt educated in use of AE for LB ADL. Pt would benefit from skilled OT to address noted impairments and functional limitations (see below for any additional details) in order to maximize safety and independence while minimizing falls risk and caregiver burden. Do not anticipate skilled OT needs following discharge.    Follow Up Recommendations  No OT follow up    Equipment Recommendations  3 in 1 bedside commode    Recommendations for Other Services       Precautions / Restrictions Precautions Precautions: Fall Restrictions Weight Bearing Restrictions: No      Mobility Bed Mobility Overal bed mobility: Needs Assistance Bed Mobility: Supine to Sit;Sit to Supine     Supine to sit: Supervision Sit to supine: Supervision    Transfers Overall transfer level: Needs assistance Equipment used: Rolling walker (2 wheeled) Transfers: Sit to/from Stand Sit to Stand: Min guard         General transfer comment: Pt required extra  time and effort to stand from the recliner but was able to stand without physical assistance.  Good eccentric control during stand to sit.     Balance Overall balance assessment: No apparent balance deficits (not formally assessed)                                         ADL either performed or assessed with clinical judgement   ADL Overall ADL's : Needs assistance/impaired                                       General ADL Comments: MIN A for LB ADL 2/2 groin pain/discomfort with hip flexion; CGA for functional transfers     Vision Patient Visual Report: No change from baseline       Perception     Praxis      Pertinent Vitals/Pain Pain Assessment: 0-10 Pain Score: 2  Pain Location: L/R groin/incision sites Pain Descriptors / Indicators: Aching Pain Intervention(s): Limited activity within patient's tolerance;Monitored during session     Hand Dominance Right   Extremity/Trunk Assessment Upper Extremity Assessment Upper Extremity Assessment: Overall WFL for tasks assessed   Lower Extremity Assessment Lower Extremity Assessment: Generalized weakness   Cervical / Trunk Assessment Cervical / Trunk Assessment: Normal   Communication Communication Communication: No difficulties   Cognition Arousal/Alertness: Awake/alert Behavior During Therapy: WFL for tasks assessed/performed Overall  Cognitive Status: Within Functional Limits for tasks assessed                                     General Comments       Exercises Other Exercises Other Exercises: pt educated in AE for LB dressing and bathing to minimize pain/discomfort while recovering from vascular surgery   Shoulder Instructions      Home Living Family/patient expects to be discharged to:: Private residence Living Arrangements: Alone Available Help at Discharge: Family;Available PRN/intermittently Type of Home: House Home Access: Stairs to enter Entrance  Stairs-Number of Steps: 1 Entrance Stairs-Rails: None(Post to hold on either side but too wide for both) Home Layout: One level     Bathroom Shower/Tub: Occupational psychologist: Standard     Home Equipment: Other (comment);Shower seat - built in;Grab bars - tub/shower;Adaptive equipment(cabinet top available to push from getting up from the toilet) Adaptive Equipment: Reacher Additional Comments: Pt owns a rollator, has medical alert system      Prior Functioning/Environment Level of Independence: Independent        Comments: Pt Ind with amb without an AD, no fall history, Ind with ADLs.        OT Problem List: Decreased strength;Decreased knowledge of use of DME or AE;Pain      OT Treatment/Interventions: Self-care/ADL training;Therapeutic activities;Therapeutic exercise;DME and/or AE instruction;Patient/family education    OT Goals(Current goals can be found in the care plan section) Acute Rehab OT Goals Patient Stated Goal: return to PLOF with less BLE pain OT Goal Formulation: With patient Time For Goal Achievement: 11/27/18 Potential to Achieve Goals: Good ADL Goals Additional ADL Goal #1: Pt will complete dressing routine with AE as needed and with modified independence, reporting <2/10 incision pain. Additional ADL Goal #2: Pt will complete all aspects of toileting with modified independence, LRAD for ambulation.  OT Frequency: Min 1X/week   Barriers to D/C:            Co-evaluation              AM-PAC OT "6 Clicks" Daily Activity     Outcome Measure Help from another person eating meals?: None Help from another person taking care of personal grooming?: None Help from another person toileting, which includes using toliet, bedpan, or urinal?: A Little Help from another person bathing (including washing, rinsing, drying)?: A Little Help from another person to put on and taking off regular upper body clothing?: None Help from another person to  put on and taking off regular lower body clothing?: A Little 6 Click Score: 21   End of Session    Activity Tolerance: Patient tolerated treatment well Patient left: in bed;with call bell/phone within reach;with bed alarm set  OT Visit Diagnosis: Other abnormalities of gait and mobility (R26.89);Pain Pain - Right/Left: Right(and L) Pain - part of body: Hip(groin)                Time: 2595-6387 OT Time Calculation (min): 10 min Charges:  OT General Charges $OT Visit: 1 Visit OT Evaluation $OT Eval Low Complexity: 1 Low  Jeni Salles, MPH, MS, OTR/L ascom [phone] 11/13/18, 4:51 PM

## 2018-11-13 NOTE — Progress Notes (Signed)
Patient alert and oriented. Some complaints of pain/discomfort to bilateral groins. Tylenol given. Patient on room air, tolerating diet, foley removed. PT ordered. Report given to [facility].

## 2018-11-13 NOTE — Evaluation (Signed)
Physical Therapy Evaluation Patient Details Name: Christina Blackburn MRN: [id number] DOB: 01-05-1946 Today's Date: 11/13/2018   History of Present Illness  Pt is a 73 yo female diagnosed with atherosclerotic occlusive disease bilateral lower extremities with rest pain and is now s/p extensive BLE revascularization.  PHM includes: HTN, CAD, PVD, CHF, DM II, CHF, HLD, PAD, skin CA, and right reverse TSA.      Clinical Impression  Pt presents with mild deficits in strength, transfers, mobility, gait, and activity tolerance but overall performed very well during the session.  Pt required extra time and effort during transfer training from various surfaces along with cues for sequencing but was steady without LOB.  Pt was able to amb 30' near the EOB initially for safety including taking side steps and ambulating backwards with cues for general sequencing with the RW for safety.  After a short therapeutic rest break the pt was able to amb in the hall 100' with slow, cautious cadence with with good stability and without LOB or adverse symptoms.  Pt will benefit from HHPT services upon discharge to safely address above deficits for decreased caregiver assistance and eventual return to PLOF.      Follow Up Recommendations Home health PT    Equipment Recommendations  None recommended by PT    Recommendations for Other Services       Precautions / Restrictions Precautions Precautions: Fall Restrictions Weight Bearing Restrictions: No(No WB restrictions noted during chart review)      Mobility  Bed Mobility               General bed mobility comments: NT, pt in recliner  Transfers Overall transfer level: Needs assistance Equipment used: Rolling walker (2 wheeled) Transfers: Sit to/from Stand Sit to Stand: Min guard;From elevated surface         General transfer comment: Pt required extra time and effort to stand from the recliner but was able to stand without physical  assistance.  Good eccentric control during stand to sit.   Ambulation/Gait Ambulation/Gait assistance: Min guard Gait Distance (Feet): 100 Feet x 1, 30 Feet x 1 Assistive device: Rolling walker (2 wheeled) Gait Pattern/deviations: Step-through pattern;Decreased step length - right;Decreased step length - left Gait velocity: Decreased   General Gait Details: Slow, cautious cadence with short B step length but steady without LOB and with no adverse symptoms; forwards/backwards gait training with verbal cues for general sequencing for safety with the RW  Stairs            Wheelchair Mobility    Modified Rankin (Stroke Patients Only)       Balance Overall balance assessment: No apparent balance deficits (not formally assessed)                                           Pertinent Vitals/Pain Pain Assessment: No/denies pain    Home Living Family/patient expects to be discharged to:: Private residence Living Arrangements: Alone Available Help at Discharge: Family;Available PRN/intermittently Type of Home: House Home Access: Stairs to enter Entrance Stairs-Rails: None(Post to hold on either side but too wide for both) Entrance Stairs-Number of Steps: 1 Home Layout: One level Home Equipment: Other (comment);Shower seat - built in;Grab bars - tub/shower(cabinet top available to push from getting up from the toilet) Additional Comments: Pt owns a rollator    Prior Function Level of Independence: Independent  Comments: Pt Ind with amb without an AD, no fall history, Ind with ADLs.     Hand Dominance   Dominant Hand: Right    Extremity/Trunk Assessment   Upper Extremity Assessment Upper Extremity Assessment: Overall WFL for tasks assessed    Lower Extremity Assessment Lower Extremity Assessment: Generalized weakness       Communication   Communication: No difficulties  Cognition Arousal/Alertness: Awake/alert Behavior During Therapy:  WFL for tasks assessed/performed Overall Cognitive Status: Within Functional Limits for tasks assessed                                        General Comments      Exercises Other Exercises Other Exercises: Sit to/from stand transfer training from multiple height surfaces Other Exercises: Forwards/backwards gait training at EOB with min verbal cues for sequencing for general safety with the RW   Assessment/Plan    PT Assessment Patient needs continued PT services  PT Problem List Decreased strength;Decreased activity tolerance;Decreased mobility;Decreased knowledge of use of DME       PT Treatment Interventions DME instruction;Gait training;Stair training;Functional mobility training;Therapeutic activities;Therapeutic exercise;Balance training;Patient/family education    PT Goals (Current goals can be found in the Care Plan section)  Acute Rehab PT Goals Patient Stated Goal: To stand and walk again PT Goal Formulation: With patient Time For Goal Achievement: 11/26/18 Potential to Achieve Goals: Good    Frequency Min 2X/week   Barriers to discharge        Co-evaluation               AM-PAC PT "6 Clicks" Mobility  Outcome Measure Help needed turning from your back to your side while in a flat bed without using bedrails?: A Little Help needed moving from lying on your back to sitting on the side of a flat bed without using bedrails?: A Little Help needed moving to and from a bed to a chair (including a wheelchair)?: A Little Help needed standing up from a chair using your arms (e.g., wheelchair or bedside chair)?: A Little Help needed to walk in hospital room?: A Little Help needed climbing 3-5 steps with a railing? : A Little 6 Click Score: 18    End of Session Equipment Utilized During Treatment: Gait belt Activity Tolerance: Patient tolerated treatment well Patient left: in chair;with call bell/phone within reach Nurse Communication: Mobility  status PT Visit Diagnosis: Muscle weakness (generalized) (M62.81);Difficulty in walking, not elsewhere classified (R26.2)    Time: 0086-7619 PT Time Calculation (min) (ACUTE ONLY): 36 min   Charges:   PT Evaluation $PT Eval Low Complexity: 1 Low PT Treatments $Gait Training: 8-22 mins        D. Royetta Asal PT, DPT 11/13/18, 3:44 PM

## 2018-11-14 ENCOUNTER — Encounter: Payer: Self-pay | Admitting: Vascular Surgery

## 2018-11-14 LAB — CBC
HCT: 27 % — ABNORMAL LOW (ref 36.0–46.0)
Hemoglobin: 8.8 g/dL — ABNORMAL LOW (ref 12.0–15.0)
MCH: 28 pg (ref 26.0–34.0)
MCHC: 32.6 g/dL (ref 30.0–36.0)
MCV: 86 fL (ref 80.0–100.0)
Platelets: 184 10*3/uL (ref 150–400)
RBC: 3.14 MIL/uL — ABNORMAL LOW (ref 3.87–5.11)
RDW: 14.4 % (ref 11.5–15.5)
WBC: 7.6 10*3/uL (ref 4.0–10.5)
nRBC: 0 % (ref 0.0–0.2)

## 2018-11-14 LAB — BASIC METABOLIC PANEL
Anion gap: 6 (ref 5–15)
BUN: 19 mg/dL (ref 8–23)
CHLORIDE: 105 mmol/L (ref 98–111)
CO2: 28 mmol/L (ref 22–32)
Calcium: 8.6 mg/dL — ABNORMAL LOW (ref 8.9–10.3)
Creatinine, Ser: 0.74 mg/dL (ref 0.44–1.00)
GFR calc Af Amer: >60 mL/min (ref >60)
GFR calc non Af Amer: >60 mL/min (ref >60)
Glucose, Bld: 164 mg/dL — ABNORMAL HIGH (ref 70–99)
POTASSIUM: 4 mmol/L (ref 3.5–5.1)
Sodium: 139 mmol/L (ref 135–145)

## 2018-11-14 LAB — MAGNESIUM: Magnesium: 1.6 mg/dL — ABNORMAL LOW (ref 1.7–2.4)

## 2018-11-14 LAB — GLUCOSE, CAPILLARY
Glucose-Capillary: 154 mg/dL — ABNORMAL HIGH (ref 70–99)
Glucose-Capillary: 211 mg/dL — ABNORMAL HIGH (ref 70–99)

## 2018-11-14 LAB — SURGICAL PATHOLOGY

## 2018-11-14 MED ORDER — OXYCODONE-ACETAMINOPHEN 5-325 MG PO TABS: 1.0000 | ORAL_TABLET | Freq: Four times a day (QID) | ORAL | 0 refills | Status: DC | PRN

## 2018-11-14 MED ORDER — INSULIN ASPART 100 UNIT/ML ~~LOC~~ SOLN
3.0000 [IU] | Freq: Three times a day (TID) | SUBCUTANEOUS | Status: DC
  Administered 2018-11-14: 13:00:00 3 [IU] via SUBCUTANEOUS
  Filled 2018-11-14: qty 1

## 2018-11-14 NOTE — Progress Notes (Signed)
Discharge instructions and Prescription provided and reviewed. No question at time of discharge. Pt to transition home in care of self with transportation provided by daughter

## 2018-11-14 NOTE — Discharge Instructions (Signed)
You may shower of as Saturday.  Please keep groins clean and dry. Incisions should be open to air unless you notice some drainage then please apply a dry gauze. Dermabond to fall off on its own. No driving while on pain medication.

## 2018-11-14 NOTE — Care Management Important Message (Signed)
Important Message  Patient Details  Name: Christina Blackburn MRN: [id number] Date of Birth: 26-Feb-1946   Medicare Important Message Given:  Yes    Wassim Kirksey A Ugonna Keirsey, RN 11/14/2018, 12:06 PM

## 2018-11-14 NOTE — Care Management Note (Signed)
Case Management Note  Patient Details  Name: Christina Blackburn MRN: [id number] Date of Birth: 01/25/1946  Subjective/Objective:  PT has recommendations in place for home with home health. RNCM consulted to discuss patients disposition. Per patient she is completely independent and still drives. Requires no assistance with activities of daily living. Patient requires no DME and OT recommends a 3 in 1 but patient is able to walk over 100 feet and per her the bathroom in her home is close and easily accessible. Patient refuses home health and she is unsure why it was recommended due to her independence. We will respect her wishes. I have informed her if she were to change her mind she could reach out to her PCP Dr Doy Hutching to initiate home health. RNCM will sign off as there are no further needs.                   Action/Plan:   Expected Discharge Date:                  Expected Discharge Plan:  Home/Self Care  In-House Referral:     Discharge planning Services  CM Consult  Post Acute Care Choice:    Choice offered to:     DME Arranged:    DME Agency:     HH Arranged:  Patient Refused [place] Agency:     Status of Service:  Completed, signed off  If discussed at [employer] of Stay Meetings, dates discussed:    Additional Comments:  Latanya Maudlin, RN 11/14/2018, 9:49 AM

## 2018-11-14 NOTE — Discharge Summary (Signed)
[facility]    Discharge Summary   Patient ID:  Christina Blackburn MRN: [id number] DOB/AGE: 17-Jun-1946 73 y.o.  Admit date: 11/12/2018 Discharge date: 11/14/2018 Date of Surgery: 11/12/2018 Surgeon: Surgeon(s): Schnier, Dolores Lory, MD Lucky Cowboy Erskine Squibb, MD  Admission Diagnosis: ASO WITH CLAUDICATIONS  Discharge Diagnoses:  ASO WITH CLAUDICATIONS  Secondary Diagnoses: Past Medical History:  Diagnosis Date  . CHF (congestive heart failure) ([place])   . Chicken pox   . Colon polyps   . Complication of anesthesia   . Coronary artery disease   . Cystitis 1986  . Diabetes mellitus without complication ([place]) 7026  . Diverticulosis   . Heart disease 2008  . Hyperlipidemia   . Hypertension 2003  . PAD (peripheral artery disease) ([place])   . PONV (postoperative nausea and vomiting)   . Rotator cuff tendinitis   . Squamous cell skin cancer 2016   Procedure(s): ENDARTERECTOMY FEMORAL INSERTION OF ILIAC STENT  Discharged Condition: good  HPI:  The patient is a 73 year old female with multiple medical issues including bilateral lower extremity peripheral artery disease with lifestyle limiting claudication and rest pain.   On 11/12/18, the patient underwent a: 1.  Bilateral common femoral and superficial femoral with left profunda femoris endarterectomy with Cormatrix patch angioplasty. 2. Bilateral external iliac artery endarterectomies 3. Open angioplasty and stent placement bilateral common iliac arteries using 8 mm x 58 mm lifestream stents. 4. Open angioplasty and stent placement right external iliac artery with a 7 mm x 37 mm lifestream stent. 5. Open angioplasty and stent placement left external iliac artery using 3 stents, a 9 mm x 40 mm life star stent followed by a 7 mm x 37 mm lifestream stent with a 7 mm x 16 mm lifestream stent.  The patient tolerated the procedure well and was transferred to the PACU then ICU for observation overnight. The patients  night of surgery was unremarkable. POD#1, the patients diet was advanced and her foley was removed without issue. The patients pain was controlled through the use of PO pain medications. The patient was seen by PT and deemed safe for dispo home.   Hospital Course:  [name] is a 73 y.o. female is S/P Bilateral:  Procedure(s): ENDARTERECTOMY FEMORAL INSERTION OF ILIAC STENT  Extubated: POD # 0  Physical exam:  A&Ox3, NAD CV: RRR Pulm: CTA Bilaterally Abdomen: Soft, Non-tender, Non-distended, (+) Bowel Sounds Bilateral Groins: OR dressings removed. Incisions clean, dry and intact. Minimal ecchymosis.  Bilateral Lower Extremity: Thigh soft, Calf soft. Extremity warm distally to toes. Neuro / Motor intact.  Post-op wounds clean, dry, intact or healing well  Pt. Ambulating, voiding and taking PO diet without difficulty.  Pt pain controlled with PO pain meds.  Labs as below  Complications: None  Consults:  Diabetes   Significant Diagnostic Studies: CBC Lab Results  Component Value Date   WBC 7.6 11/14/2018   HGB 8.8 (L) 11/14/2018   HCT 27.0 (L) 11/14/2018   MCV 86.0 11/14/2018   PLT 184 11/14/2018   BMET    Component Value Date/Time   NA 139 11/14/2018 0424   K 4.0 11/14/2018 0424   CL 105 11/14/2018 0424   CO2 28 11/14/2018 0424   GLUCOSE 164 (H) 11/14/2018 0424   BUN 19 11/14/2018 0424   CREATININE 0.74 11/14/2018 0424   CALCIUM 8.6 (L) 11/14/2018 0424   GFRNONAA >60 11/14/2018 0424   GFRAA >60 11/14/2018 0424   COAG Lab Results  Component  Value Date   INR 0.97 10/30/2018   INR 0.95 04/04/2018   Disposition:  Discharge to :Home  Allergies as of 11/14/2018      Reactions   Tape Other (See Comments)   Hypoallergenic tape - blisters      Medication List    TAKE these medications   amLODipine 5 MG tablet Commonly known as:  NORVASC Take 5 mg by mouth daily.   aspirin 81 MG tablet Take 81 mg by mouth daily.   atorvastatin 20 MG  tablet Commonly known as:  LIPITOR Take 20 mg by mouth daily.   calcium carbonate 600 MG Tabs tablet Commonly known as:  OS-CAL Take 600 mg by mouth 2 (two) times daily with a meal.   clopidogrel 75 MG tablet Commonly known as:  PLAVIX Take 75 mg by mouth daily.   cyclobenzaprine 10 MG tablet Commonly known as:  FLEXERIL Take 10 mg by mouth 3 (three) times daily as needed for muscle spasms.   fenofibrate 145 MG tablet Commonly known as:  TRICOR Take 145 mg by mouth daily.   fish oil-omega-3 fatty acids 1000 MG capsule Take 1 g by mouth daily.   glimepiride 4 MG tablet Commonly known as:  AMARYL Take 4 mg by mouth daily with breakfast.   losartan-hydrochlorothiazide 100-25 MG tablet Commonly known as:  HYZAAR Take 1 tablet by mouth daily.   magnesium oxide 400 MG tablet Commonly known as:  MAG-OX Take 400 mg by mouth daily.   metFORMIN 1000 MG tablet Commonly known as:  GLUCOPHAGE Take 1,000 mg by mouth daily with breakfast.   metoprolol succinate 100 MG 24 hr tablet Commonly known as:  TOPROL-XL Take 100 mg by mouth daily. Take with or immediately following a meal.   OVER THE COUNTER MEDICATION Take 2 tablets by mouth at bedtime. Mag R&R Supplement   oxyCODONE-acetaminophen 5-325 MG tablet Commonly known as:  PERCOCET/ROXICET Take 1 tablet by mouth every 6 (six) hours as needed for moderate pain or severe pain.   traZODone 50 MG tablet Commonly known as:  DESYREL Take 50 mg by mouth at bedtime.   TURMERIC PO Take by mouth daily.   vitamin B-12 1000 MCG tablet Commonly known as:  CYANOCOBALAMIN Take 1,000 mcg by mouth daily.   VITAMIN D3 PO Take 400 Units by mouth.   vitamin E 400 UNIT capsule Take 400 Units by mouth daily.      Verbal and written Discharge instructions given to the patient. Wound care per Discharge AVS Follow-up Information    Schnier, Dolores Lory, MD Follow up in 1 week(s).   Specialties:  Vascular Surgery, Cardiology, Radiology,  Vascular Surgery Why:  See midlevel. First post-op. Incision check.  Contact information: [place] [address] [phone]          Signed: Sela Hua, PA-C  11/14/2018, 12:56 PM

## 2018-11-14 NOTE — Progress Notes (Signed)
Inpatient Diabetes Program Recommendations  AACE/ADA: New Consensus Statement on Inpatient Glycemic Control   Target Ranges:  Prepandial:   less than 140 mg/dL      Peak postprandial:   less than 180 mg/dL (1-2 hours)      Critically ill patients:  140 - 180 mg/dL   Results for CANDISE, CRABTREE" (MRN [id number]) as of 11/14/2018 09:10  Ref. Range 11/13/2018 07:28 11/13/2018 11:25 11/13/2018 17:28 11/13/2018 21:47 11/14/2018 07:51  Glucose-Capillary Latest Ref Range: 70 - 99 mg/dL 160 (H) 225 (H) 177 (H) 233 (H) 154 (H)   Review of Glycemic Control  Diabetes history: DM2 Outpatient Diabetes medications: Amaryl 4 mg QAM, Metformin 1000 mg QAM Current orders for Inpatient glycemic control: Novolog 0-15 units TID with meals, Novolog 0-5 units QHS  Inpatient Diabetes Program Recommendations:  Insulin - Meal Coverage: Please consider ordering Novolog 3 units TID with meals for meal coverage if patient eats at least 50% of meals.  NOTE: Noted consult for Diabetes Coordinator for recommendations. Chart reviewed. Noted patient received one time Decadron 5 mg x 1 on 11/12/18 which is contributing to hyperglycemia. Post prandial glucose is consistently elevated. Recommend adding Novolog 3 units TID with meals for meal coverage (along with correction scale).  Thanks, Barnie Alderman, RN, MSN, CDE Diabetes Coordinator Inpatient Diabetes Program [phone] (Team Pager from 8am to 5pm)

## 2018-11-19 ENCOUNTER — Encounter (INDEPENDENT_AMBULATORY_CARE_PROVIDER_SITE_OTHER): Payer: Self-pay | Admitting: Nurse Practitioner

## 2018-11-19 ENCOUNTER — Ambulatory Visit (INDEPENDENT_AMBULATORY_CARE_PROVIDER_SITE_OTHER): Payer: PPO | Admitting: Nurse Practitioner

## 2018-11-19 ENCOUNTER — Telehealth (INDEPENDENT_AMBULATORY_CARE_PROVIDER_SITE_OTHER): Payer: Self-pay

## 2018-11-19 VITALS — BP 146/74 | HR 83 | Resp 14 | Ht 59.75 in | Wt 173.6 lb

## 2018-11-19 DIAGNOSIS — I10 Essential (primary) hypertension: Secondary | ICD-10-CM

## 2018-11-19 DIAGNOSIS — Z87891 Personal history of nicotine dependence: Secondary | ICD-10-CM

## 2018-11-19 DIAGNOSIS — E782 Mixed hyperlipidemia: Secondary | ICD-10-CM

## 2018-11-19 DIAGNOSIS — T8149XA Infection following a procedure, other surgical site, initial encounter: Secondary | ICD-10-CM

## 2018-11-19 DIAGNOSIS — I70223 Atherosclerosis of native arteries of extremities with rest pain, bilateral legs: Secondary | ICD-10-CM

## 2018-11-19 MED ORDER — KEFLEX 500 MG PO CAPS: 500.0000 mg | ORAL_CAPSULE | Freq: Four times a day (QID) | ORAL | 0 refills | Status: AC

## 2018-11-19 NOTE — Progress Notes (Signed)
Subjective:    Patient ID: [name], female    DOB: 1945-11-04, 73 y.o.   MRN: [id number] Chief Complaint  Patient presents with  . Follow-up    1 WEEK [facility]    HPI  Zyon Latarra Eagleton is a 73 y.o. female presents today for evaluation of bilateral femoral endarterectomy incisions following her procedure done on 11/12/2018.  Patient states that she is still pretty sore and ambulating is somewhat difficult.  She does report having a fever of approximately 101 for a couple of nights.  She denies any upper respiratory symptoms such as stuffy nose, sneezing, cough.  She denies any nausea, vomiting or diarrhea.  The wounds appear well approximated, however they are both erythematous with some areas of healing ridges.  The patient does report some drainage from the wounds.  The patient had some dressings over them which she report has not been changed since she has been home.  Past Medical History:  Diagnosis Date  . CHF (congestive heart failure) ([place])   . Chicken pox   . Colon polyps   . Complication of anesthesia   . Coronary artery disease   . Cystitis 1986  . Diabetes mellitus without complication ([place]) 5621  . Diverticulosis   . Heart disease 2008  . Hyperlipidemia   . Hypertension 2003  . PAD (peripheral artery disease) ([place])   . PONV (postoperative nausea and vomiting)   . Rotator cuff tendinitis   . Squamous cell skin cancer 2016    Past Surgical History:  Procedure Laterality Date  . ABDOMINAL HYSTERECTOMY  1989  . BREAST BIOPSY Left 2014   neg  . CARDIAC CATHETERIZATION     cornary stent  . [place]  . CHOLECYSTECTOMY  1987  . COLONOSCOPY    . COLONOSCOPY WITH PROPOFOL N/A 09/22/2018   Procedure: COLONOSCOPY WITH PROPOFOL;  Surgeon: Lollie Sails, MD;  Location: [facility] ENDOSCOPY;  Service: Endoscopy;  Laterality: N/A;  . CORONARY ANGIOPLASTY  2008   stent x1  . ENDARTERECTOMY FEMORAL Bilateral 11/12/2018   Procedure:  ENDARTERECTOMY FEMORAL;  Surgeon: Katha Cabal, MD;  Location: [facility] ORS;  Service: Vascular;  Laterality: Bilateral;  . INSERTION OF ILIAC STENT Bilateral 11/12/2018   Procedure: INSERTION OF ILIAC STENT;  Surgeon: Katha Cabal, MD;  Location: [facility] ORS;  Service: Vascular;  Laterality: Bilateral;  . JOINT REPLACEMENT Right 04/12/2018   shoulder  . LOWER EXTREMITY ANGIOGRAPHY Left 08/05/2018   Procedure: LOWER EXTREMITY ANGIOGRAPHY;  Surgeon: Katha Cabal, MD;  Location: [place] CV LAB;  Service: Cardiovascular;  Laterality: Left;  . REVERSE SHOULDER ARTHROPLASTY Right 04/08/2018   Procedure: REVERSE SHOULDER ARTHROPLASTY;  Surgeon: Corky Mull, MD;  Location: [facility] ORS;  Service: Orthopedics;  Laterality: Right;  . TUBAL LIGATION      Social History   Socioeconomic History  . Marital status: Widowed    Spouse name: Not on file  . Number of children: Not on file  . Years of education: Not on file  . Highest education level: Not on file  Occupational History  . Occupation: Retired    Comment: Geologist, engineering.   Social Needs  . Financial resource strain: Not very hard  . Food insecurity:    Worry: Not on file    Inability: Not on file  . Transportation needs:    Medical: No    Non-medical: No  Tobacco Use  . Smoking status: Former Smoker    Years: 6.00  Types: Cigarettes    Last attempt to quit: 2008    Years since quitting: 12.1  . Smokeless tobacco: Never Used  Substance and Sexual Activity  . Alcohol use: Yes    Comment: rare  . Drug use: No  . Sexual activity: Not on file  Lifestyle  . Physical activity:    Days per week: 2 days    Minutes per session: Not on file  . Stress: Only a little  Relationships  . Social connections:    Talks on phone: More than three times a week    Gets together: Not on file    Attends religious service: Never    Active member of club or organization: No    Attends meetings of clubs or organizations: Never     Relationship status: Widowed  . Intimate partner violence:    Fear of current or ex partner: No    Emotionally abused: No    Physically abused: No    Forced sexual activity: No  Other Topics Concern  . Not on file  Social History Narrative  . Not on file    Family History  Problem Relation Age of Onset  . Colon cancer Cousin 57  . Lymphoma Brother 54  . Lung cancer Mother   . Hypertension Father   . Kidney failure Father   . Breast cancer Neg Hx     Allergies  Allergen Reactions  . Tape Other (See Comments)    Hypoallergenic tape - blisters     Review of Systems   Review of Systems: Negative Unless Checked Constitutional: [] Weight loss  [] Fever  [] Chills Cardiac: [] Chest pain   []  Atrial Fibrillation  [] Palpitations   [] Shortness of breath when laying flat   [] Shortness of breath with exertion. [] Shortness of breath at rest Vascular:  [] Pain in legs with walking   [] Pain in legs with standing [] Pain in legs when laying flat   [] Claudication    [] Pain in feet when laying flat    [] History of DVT   [] Phlebitis   [] Swelling in legs   [] Varicose veins   [] Non-healing ulcers Pulmonary:   [] Uses home oxygen   [] Productive cough   [] Hemoptysis   [] Wheeze  [] COPD   [] Asthma Neurologic:  [] Dizziness   [] Seizures  [] Blackouts [] History of stroke   [] History of TIA  [] Aphasia   [] Temporary Blindness   [] Weakness or numbness in arm   [] Weakness or numbness in leg Musculoskeletal:   [] Joint swelling   [] Joint pain   [] Low back pain  []  History of Knee Replacement [] Arthritis [] back Surgeries  []  Spinal Stenosis    Hematologic:  [] Easy bruising  [] Easy bleeding   [] Hypercoagulable state   [] Anemic Gastrointestinal:  [] Diarrhea   [] Vomiting  [] Gastroesophageal reflux/heartburn   [] Difficulty swallowing. [] Abdominal pain Genitourinary:  [] Chronic kidney disease   [] Difficult urination  [] Anuric   [] Blood in urine [] Frequent urination  [] Burning with urination   [] Hematuria Skin:  [] Rashes    [] Ulcers [x] Wounds Psychological:  [] History of anxiety   []  History of major depression  []  Memory Difficulties     Objective:   Physical Exam  BP (!) 146/74 (BP Location: Right Arm, Patient Position: Sitting)   Pulse 83   Resp 14   Ht 4' 11.75" (1.518 m)   Wt 173 lb 9.6 oz (78.7 kg)   BMI 34.19 kg/m   Gen: WD/WN, NAD Head: [place]/AT, No temporalis wasting.  Ear/Nose/Throat: Hearing grossly intact, nares w/o erythema or drainage Eyes: PER, EOMI,  sclera nonicteric.  Neck: Supple, no masses.  No JVD.  Pulmonary:  Good air movement, no use of accessory muscles.  Cardiac: RRR Vascular:  Bilateral incisions, erythematous area surrounding incisions, no foul odor, some drainage present that appears serosanguineous Vessel Right Left  Radial Palpable Palpable   Gastrointestinal: soft, non-distended. No guarding/no peritoneal signs.  Musculoskeletal: M/S 5/5 throughout.  No deformity or atrophy.  Neurologic: Pain and light touch intact in extremities.  Symmetrical.  Speech is fluent. Motor exam as listed above. Psychiatric: Judgment intact, Mood & affect appropriate for pt's clinical situation. Dermatologic: No Venous rashes. No Ulcers Noted.   Lymph : No Cervical lymphadenopathy, no lichenification or skin changes of chronic lymphedema.      Assessment & Plan:   1. Atherosclerosis of native artery of both lower extremities with rest pain [facility]) Patient does report less pain with ambulation although there is still discomfort due to the incisions.  We will have the patient come back in with ABIs at a later date once we have allowed her incisions to heal somewhat.  2. Essential hypertension Continue antihypertensive medications as already ordered, these medications have been reviewed and there are no changes at this time.   3. Mixed hyperlipidemia Continue statin as ordered and reviewed, no changes at this time   4. Surgical site infection Given the patient's fever as well as the  area of erythema surrounding the incision, I felt it was prudent to give her a short course of antibiotics in order to treat any possible surgical site infection.  It was also stressed to the patient that due to the location of the wounds as well as body habitus that it was imperative to keep the area clean and dry.  Advised the patient that she is able to wash however she should not scrub the area just let water run off and pat dry she should also keep gauze over the areas.  I advised her as to what treatment she should be aware of and what treatment she should contact us in regards to.  We will have the patient follow-up in 1 week to evaluate. - KEFLEX 500 MG capsule; Take 1 capsule (500 mg total) by mouth 4 (four) times daily for 5 days.  Dispense: 20 capsule; Refill: 0   Current Outpatient Medications on File Prior to Visit  Medication Sig Dispense Refill  . amLODipine (NORVASC) 5 MG tablet Take 5 mg by mouth daily.    Marland Kitchen aspirin 81 MG tablet Take 81 mg by mouth daily.    Marland Kitchen atorvastatin (LIPITOR) 20 MG tablet Take 20 mg by mouth daily.     . calcium carbonate (OS-CAL) 600 MG TABS tablet Take 600 mg by mouth 2 (two) times daily with a meal.    . Cholecalciferol (VITAMIN D3 PO) Take 400 Units by mouth.    . clopidogrel (PLAVIX) 75 MG tablet Take 75 mg by mouth daily.    . cyclobenzaprine (FLEXERIL) 10 MG tablet Take 10 mg by mouth 3 (three) times daily as needed for muscle spasms.    . fenofibrate (TRICOR) 145 MG tablet Take 145 mg by mouth daily.    . fish oil-omega-3 fatty acids 1000 MG capsule Take 1 g by mouth daily.    Marland Kitchen glimepiride (AMARYL) 4 MG tablet Take 4 mg by mouth daily with breakfast.     . losartan-hydrochlorothiazide (HYZAAR) 100-25 MG tablet Take 1 tablet by mouth daily.     . magnesium oxide (MAG-OX) 400 MG tablet Take  400 mg by mouth daily.    . metFORMIN (GLUCOPHAGE) 1000 MG tablet Take 1,000 mg by mouth daily with breakfast.     . metoprolol succinate (TOPROL-XL) 100 MG 24 hr  tablet Take 100 mg by mouth daily. Take with or immediately following a meal.    . OVER THE COUNTER MEDICATION Take 2 tablets by mouth at bedtime. Mag R&R Supplement    . oxyCODONE-acetaminophen (PERCOCET/ROXICET) 5-325 MG tablet Take 1 tablet by mouth every 6 (six) hours as needed for moderate pain or severe pain. 28 tablet 0  . traZODone (DESYREL) 50 MG tablet Take 50 mg by mouth at bedtime.    . TURMERIC PO Take by mouth daily.    . vitamin B-12 (CYANOCOBALAMIN) 1000 MCG tablet Take 1,000 mcg by mouth daily.    . vitamin E 400 UNIT capsule Take 400 Units by mouth daily.      No current facility-administered medications on file prior to visit.     There are no Patient Instructions on file for this visit. No follow-ups on file.   Kris Hartmann, NP  This note was completed with Sales executive.  Any errors are purely unintentional.

## 2018-11-19 NOTE — Telephone Encounter (Signed)
[facility], [place],  called and left a message on the triage line and stated that the Keflex was sent in as brand only. 1. They do not carry brand 2. If ordered for patient it would be over 200 dollars  Please advise if generic is ok or if medication should be changed.

## 2018-11-20 NOTE — Telephone Encounter (Signed)
Called pharmacy and spoke with [facility] who states someone called back in regards to this and gave the ok and the pt has since picked up rx. Nothing further is needed

## 2018-11-20 NOTE — Telephone Encounter (Signed)
Generic is fine for the patient , if you can call and let them know

## 2018-11-26 ENCOUNTER — Encounter (INDEPENDENT_AMBULATORY_CARE_PROVIDER_SITE_OTHER): Payer: Self-pay | Admitting: Nurse Practitioner

## 2018-11-26 ENCOUNTER — Other Ambulatory Visit: Payer: Self-pay

## 2018-11-26 ENCOUNTER — Telehealth (INDEPENDENT_AMBULATORY_CARE_PROVIDER_SITE_OTHER): Payer: Self-pay | Admitting: Nurse Practitioner

## 2018-11-26 ENCOUNTER — Ambulatory Visit (INDEPENDENT_AMBULATORY_CARE_PROVIDER_SITE_OTHER): Payer: PPO | Admitting: Nurse Practitioner

## 2018-11-26 VITALS — BP 164/70 | HR 80 | Resp 12 | Ht 59.0 in | Wt 174.0 lb

## 2018-11-26 DIAGNOSIS — E782 Mixed hyperlipidemia: Secondary | ICD-10-CM

## 2018-11-26 DIAGNOSIS — E1151 Type 2 diabetes mellitus with diabetic peripheral angiopathy without gangrene: Secondary | ICD-10-CM

## 2018-11-26 DIAGNOSIS — T8149XA Infection following a procedure, other surgical site, initial encounter: Secondary | ICD-10-CM

## 2018-11-26 MED ORDER — CEPHALEXIN 500 MG PO CAPS: 500.0000 mg | ORAL_CAPSULE | Freq: Two times a day (BID) | ORAL | 0 refills | Status: DC

## 2018-11-26 NOTE — Progress Notes (Signed)
SUBJECTIVE:  Patient ID: Christina Blackburn, female    DOB: [birth year], 73 y.o.   MRN: [id number] Chief Complaint  Patient presents with  . Follow-up    HPI  Christina Blackburn is a 73 y.o. female presents today for 1 week wound evaluation.  The patient underwent a bilateral femoral endarterectomy on 11/12/2018.  Several days after the surgery she stated that she began to have fevers and had soreness as well.  At her first wound check the wounds were well approximated however were very reddened at the suture line as well as surrounding the suture.  It was very moist in that area.  The patient was given a 5-day course of Keflex, as well as instructions that she should keep the wound clean by showering, without scrubbing and patting dry.  She was also instructed to keep the wound dry with frequent gauze changes.  The patient denies any fevers, chills, nausea, vomiting or diarrhea since her last visit a week ago.  Today, her wounds appear to be dehiscing.  The left worse than the right.  The left has complete dehiscence although there are still some sutures visible.  The right is in the beginning stages.  There is significant fibrinous exudate covering the wounds.  There is a slight foul smelling odor.  The left incision has significant drainage.  The left incision is approximately 7 cm x 1 cm.  The depth is approximately 1 cm.  The right incision is approximately 7 cm x 1 cm.  The depth is approximately 1 cm. Patient currently is on a diabetic diet. Past Medical History:  Diagnosis Date  . CHF (congestive heart failure) ([place])   . Chicken pox   . Colon polyps   . Complication of anesthesia   . Coronary artery disease   . Cystitis 1986  . Diabetes mellitus without complication ([place]) 7124  . Diverticulosis   . Heart disease 2008  . Hyperlipidemia   . Hypertension 2003  . PAD (peripheral artery disease) ([place])   . PONV (postoperative nausea and vomiting)   . Rotator cuff tendinitis   .  Squamous cell skin cancer 2016    Past Surgical History:  Procedure Laterality Date  . ABDOMINAL HYSTERECTOMY  1989  . BREAST BIOPSY Left 2014   neg  . CARDIAC CATHETERIZATION     cornary stent  . [place]  . CHOLECYSTECTOMY  1987  . COLONOSCOPY    . COLONOSCOPY WITH PROPOFOL N/A 09/22/2018   Procedure: COLONOSCOPY WITH PROPOFOL;  Surgeon: Lollie Sails, MD;  Location: [facility] ENDOSCOPY;  Service: Endoscopy;  Laterality: N/A;  . CORONARY ANGIOPLASTY  2008   stent x1  . ENDARTERECTOMY FEMORAL Bilateral 11/12/2018   Procedure: ENDARTERECTOMY FEMORAL;  Surgeon: Katha Cabal, MD;  Location: [facility] ORS;  Service: Vascular;  Laterality: Bilateral;  . INSERTION OF ILIAC STENT Bilateral 11/12/2018   Procedure: INSERTION OF ILIAC STENT;  Surgeon: Katha Cabal, MD;  Location: [facility] ORS;  Service: Vascular;  Laterality: Bilateral;  . JOINT REPLACEMENT Right 04/12/2018   shoulder  . LOWER EXTREMITY ANGIOGRAPHY Left 08/05/2018   Procedure: LOWER EXTREMITY ANGIOGRAPHY;  Surgeon: Katha Cabal, MD;  Location: [place] CV LAB;  Service: Cardiovascular;  Laterality: Left;  . REVERSE SHOULDER ARTHROPLASTY Right 04/08/2018   Procedure: REVERSE SHOULDER ARTHROPLASTY;  Surgeon: Corky Mull, MD;  Location: [facility] ORS;  Service: Orthopedics;  Laterality: Right;  . TUBAL LIGATION      Social History  Socioeconomic History  . Marital status: Widowed    Spouse name: Not on file  . Number of children: Not on file  . Years of education: Not on file  . Highest education level: Not on file  Occupational History  . Occupation: Retired    Comment: Geologist, engineering.   Social Needs  . Financial resource strain: Not very hard  . Food insecurity:    Worry: Not on file    Inability: Not on file  . Transportation needs:    Medical: No    Non-medical: No  Tobacco Use  . Smoking status: Former Smoker    Years: 6.00    Types: Cigarettes    Last attempt to quit: 2008     Years since quitting: 12.1  . Smokeless tobacco: Never Used  Substance and Sexual Activity  . Alcohol use: Yes    Comment: rare  . Drug use: No  . Sexual activity: Not on file  Lifestyle  . Physical activity:    Days per week: 2 days    Minutes per session: Not on file  . Stress: Only a little  Relationships  . Social connections:    Talks on phone: More than three times a week    Gets together: Not on file    Attends religious service: Never    Active member of club or organization: No    Attends meetings of clubs or organizations: Never    Relationship status: Widowed  . Intimate partner violence:    Fear of current or ex partner: No    Emotionally abused: No    Physically abused: No    Forced sexual activity: No  Other Topics Concern  . Not on file  Social History Narrative  . Not on file    Family History  Problem Relation Age of Onset  . Colon cancer Cousin 57  . Lymphoma Brother 31  . Lung cancer Mother   . Hypertension Father   . Kidney failure Father   . Breast cancer Neg Hx     Allergies  Allergen Reactions  . Tape Other (See Comments)    Hypoallergenic tape - blisters     Review of Systems   Review of Systems: Negative Unless Checked Constitutional: [] Weight loss  [] Fever  [] Chills Cardiac: [] Chest pain   []  Atrial Fibrillation  [] Palpitations   [] Shortness of breath when laying flat   [] Shortness of breath with exertion. [] Shortness of breath at rest Vascular:  [] Pain in legs with walking   [] Pain in legs with standing [] Pain in legs when laying flat   [] Claudication    [] Pain in feet when laying flat    [] History of DVT   [] Phlebitis   [] Swelling in legs   [] Varicose veins   [] Non-healing ulcers Pulmonary:   [] Uses home oxygen   [] Productive cough   [] Hemoptysis   [] Wheeze  [] COPD   [] Asthma Neurologic:  [] Dizziness   [] Seizures  [] Blackouts [] History of stroke   [] History of TIA  [] Aphasia   [] Temporary Blindness   [] Weakness or numbness in arm    [] Weakness or numbness in leg Musculoskeletal:   [] Joint swelling   [] Joint pain   [] Low back pain  []  History of Knee Replacement [] Arthritis [] back Surgeries  []  Spinal Stenosis    Hematologic:  [] Easy bruising  [] Easy bleeding   [] Hypercoagulable state   [] Anemic Gastrointestinal:  [] Diarrhea   [] Vomiting  [] Gastroesophageal reflux/heartburn   [] Difficulty swallowing. [] Abdominal pain Genitourinary:  [] Chronic kidney disease   []   Difficult urination  [] Anuric   [] Blood in urine [] Frequent urination  [] Burning with urination   [] Hematuria Skin:  [] Rashes   [] Ulcers [x] Wounds Psychological:  [] History of anxiety   []  History of major depression  []  Memory Difficulties      OBJECTIVE:   Physical Exam  BP (!) 164/70 (BP Location: Left Arm, Patient Position: Sitting, Cuff Size: Small)   Pulse 80   Resp 12   Ht 4\' 11"  (1.499 m)   Wt 174 lb (78.9 kg)   BMI 35.14 kg/m   Gen: WD/WN, NAD Head: [place]/AT, No temporalis wasting.  Ear/Nose/Throat: Hearing grossly intact, nares w/o erythema or drainage Eyes: PER, EOMI, sclera nonicteric.  Neck: Supple, no masses.  No JVD.  Pulmonary:  Good air movement, no use of accessory muscles.  Cardiac: RRR Vascular:  See HPI for description of wounds Vessel Right Left  Radial Palpable Palpable   Gastrointestinal: soft, non-distended. No guarding/no peritoneal signs.  Musculoskeletal: M/S 5/5 throughout.  No deformity or atrophy.  Neurologic: Pain and light touch intact in extremities.  Symmetrical.  Speech is fluent. Motor exam as listed above. Psychiatric: Judgment intact, Mood & affect appropriate for pt's clinical situation. Dermatologic: No Venous rashes. No Ulcers Noted.  No changes consistent with cellulitis. Lymph : No Cervical lymphadenopathy, no lichenification or skin changes of chronic lymphedema.       ASSESSMENT AND PLAN:  1. Surgical site infection I have given the patient an additional course of oral antibiotics due to the new wound  dehiscence and increased possibility of infection.  Due to the patient's body habitus and location of the wounds, it is felt that a bilateral wound debridement would be her best course of action.  We will also plan for wound VAC placement on these wounds.  We will also work on coordinating home health care to help with the dressing changes of her wound vacs.  The patient will follow-up in office after the procedure. - cephALEXin (KEFLEX) 500 MG capsule; Take 1 capsule (500 mg total) by mouth 2 (two) times daily.  Dispense: 24 capsule; Refill: 0  2. Mixed hyperlipidemia Continue statin as ordered and reviewed, no changes at this time   3. Type 2 diabetes mellitus with diabetic peripheral angiopathy without gangrene, without long-term current use of insulin (HCC) Continue hypoglycemic medications as already ordered, these medications have been reviewed and there are no changes at this time.  Hgb A1C to be monitored as already arranged by primary service    Current Outpatient Medications on File Prior to Visit  Medication Sig Dispense Refill  . amLODipine (NORVASC) 5 MG tablet Take 5 mg by mouth daily.    Marland Kitchen aspirin 81 MG tablet Take 81 mg by mouth daily.    Marland Kitchen atorvastatin (LIPITOR) 20 MG tablet Take 20 mg by mouth daily.     . calcium carbonate (OS-CAL) 600 MG TABS tablet Take 600 mg by mouth 2 (two) times daily with a meal.    . Cholecalciferol (VITAMIN D3 PO) Take 400 Units by mouth.    . clopidogrel (PLAVIX) 75 MG tablet Take 75 mg by mouth daily.    . cyclobenzaprine (FLEXERIL) 10 MG tablet Take 10 mg by mouth 3 (three) times daily as needed for muscle spasms.    . fenofibrate (TRICOR) 145 MG tablet Take 145 mg by mouth daily.    . fish oil-omega-3 fatty acids 1000 MG capsule Take 1 g by mouth daily.    Marland Kitchen glimepiride (AMARYL) 4 MG tablet  Take 4 mg by mouth daily with breakfast.     . losartan-hydrochlorothiazide (HYZAAR) 100-25 MG tablet Take 1 tablet by mouth daily.     . magnesium  oxide (MAG-OX) 400 MG tablet Take 400 mg by mouth daily.    . metFORMIN (GLUCOPHAGE) 1000 MG tablet Take 1,000 mg by mouth daily with breakfast.     . metoprolol succinate (TOPROL-XL) 100 MG 24 hr tablet Take 100 mg by mouth daily. Take with or immediately following a meal.    . OVER THE COUNTER MEDICATION Take 2 tablets by mouth at bedtime. Mag R&R Supplement    . TURMERIC PO Take by mouth daily.    . vitamin B-12 (CYANOCOBALAMIN) 1000 MCG tablet Take 1,000 mcg by mouth daily.    . vitamin E 400 UNIT capsule Take 400 Units by mouth daily.     Marland Kitchen oxyCODONE-acetaminophen (PERCOCET/ROXICET) 5-325 MG tablet Take 1 tablet by mouth every 6 (six) hours as needed for moderate pain or severe pain. (Patient not taking: Reported on 11/26/2018) 28 tablet 0  . traZODone (DESYREL) 50 MG tablet Take 50 mg by mouth at bedtime.     No current facility-administered medications on file prior to visit.     There are no Patient Instructions on file for this visit. No follow-ups on file.   Kris Hartmann, NP  This note was completed with Sales executive.  Any errors are purely unintentional.

## 2018-11-27 ENCOUNTER — Other Ambulatory Visit (INDEPENDENT_AMBULATORY_CARE_PROVIDER_SITE_OTHER): Payer: Self-pay | Admitting: Nurse Practitioner

## 2018-11-27 DIAGNOSIS — T8149XA Infection following a procedure, other surgical site, initial encounter: Secondary | ICD-10-CM

## 2018-11-27 MED ORDER — SILVER SULFADIAZINE 1 % EX CREA: 1.0000 "application " | TOPICAL_CREAM | Freq: Every day | CUTANEOUS | 0 refills | Status: DC

## 2018-11-28 ENCOUNTER — Telehealth (INDEPENDENT_AMBULATORY_CARE_PROVIDER_SITE_OTHER): Payer: Self-pay

## 2018-11-28 ENCOUNTER — Other Ambulatory Visit (INDEPENDENT_AMBULATORY_CARE_PROVIDER_SITE_OTHER): Payer: Self-pay | Admitting: Nurse Practitioner

## 2018-11-28 NOTE — Telephone Encounter (Signed)
Spoke with the patient and gave her the information regarding her surgery on 12/02/2018 with Dr. Delana Meyer. Patient has an appt on 12/01/2018 for pre-op.

## 2018-12-01 ENCOUNTER — Other Ambulatory Visit: Payer: Self-pay

## 2018-12-01 ENCOUNTER — Encounter
Admission: RE | Admit: 2018-12-01 | Discharge: 2018-12-01 | Disposition: A | Discharge status: Home / Self Care | Payer: PPO | Source: Ambulatory Visit | Attending: Vascular Surgery | Admitting: Vascular Surgery

## 2018-12-01 DIAGNOSIS — T8149XA Infection following a procedure, other surgical site, initial encounter: Secondary | ICD-10-CM | POA: Diagnosis not present

## 2018-12-01 DIAGNOSIS — I509 Heart failure, unspecified: Secondary | ICD-10-CM | POA: Insufficient documentation

## 2018-12-01 DIAGNOSIS — I110 Hypertensive heart disease with heart failure: Secondary | ICD-10-CM | POA: Insufficient documentation

## 2018-12-01 DIAGNOSIS — E1151 Type 2 diabetes mellitus with diabetic peripheral angiopathy without gangrene: Secondary | ICD-10-CM

## 2018-12-01 DIAGNOSIS — T8131XA Disruption of external operation (surgical) wound, not elsewhere classified, initial encounter: Secondary | ICD-10-CM | POA: Diagnosis not present

## 2018-12-01 DIAGNOSIS — I2510 Atherosclerotic heart disease of native coronary artery without angina pectoris: Secondary | ICD-10-CM | POA: Diagnosis not present

## 2018-12-01 DIAGNOSIS — Z01818 Encounter for other preprocedural examination: Secondary | ICD-10-CM | POA: Insufficient documentation

## 2018-12-01 DIAGNOSIS — E782 Mixed hyperlipidemia: Secondary | ICD-10-CM | POA: Diagnosis not present

## 2018-12-01 DIAGNOSIS — Z85828 Personal history of other malignant neoplasm of skin: Secondary | ICD-10-CM | POA: Diagnosis not present

## 2018-12-01 DIAGNOSIS — Z7984 Long term (current) use of oral hypoglycemic drugs: Secondary | ICD-10-CM | POA: Diagnosis not present

## 2018-12-01 DIAGNOSIS — Z87891 Personal history of nicotine dependence: Secondary | ICD-10-CM | POA: Diagnosis not present

## 2018-12-01 DIAGNOSIS — Z955 Presence of coronary angioplasty implant and graft: Secondary | ICD-10-CM | POA: Diagnosis not present

## 2018-12-01 DIAGNOSIS — Y838 Other surgical procedures as the cause of abnormal reaction of the patient, or of later complication, without mention of misadventure at the time of the procedure: Secondary | ICD-10-CM | POA: Diagnosis not present

## 2018-12-01 HISTORY — DX: Anemia, unspecified: D64.9

## 2018-12-01 LAB — CBC WITH DIFFERENTIAL/PLATELET
Abs Immature Granulocytes: 0.07 10*3/uL (ref 0.00–0.07)
BASOS PCT: 1 %
Basophils Absolute: 0.1 10*3/uL (ref 0.0–0.1)
Eosinophils Absolute: 0.2 10*3/uL (ref 0.0–0.5)
Eosinophils Relative: 3 %
HCT: 31.4 % — ABNORMAL LOW (ref 36.0–46.0)
Hemoglobin: 9.7 g/dL — ABNORMAL LOW (ref 12.0–15.0)
Immature Granulocytes: 1 %
Lymphocytes Relative: 22 %
Lymphs Abs: 1.6 10*3/uL (ref 0.7–4.0)
MCH: 26.9 pg (ref 26.0–34.0)
MCHC: 30.9 g/dL (ref 30.0–36.0)
MCV: 87 fL (ref 80.0–100.0)
Monocytes Absolute: 0.6 10*3/uL (ref 0.1–1.0)
Monocytes Relative: 8 %
Neutro Abs: 4.8 10*3/uL (ref 1.7–7.7)
Neutrophils Relative %: 65 %
PLATELETS: 369 10*3/uL (ref 150–400)
RBC: 3.61 MIL/uL — ABNORMAL LOW (ref 3.87–5.11)
RDW: 14.6 % (ref 11.5–15.5)
WBC: 7.3 10*3/uL (ref 4.0–10.5)
nRBC: 0 % (ref 0.0–0.2)

## 2018-12-01 LAB — BASIC METABOLIC PANEL
ANION GAP: 10 (ref 5–15)
BUN: 15 mg/dL (ref 8–23)
CO2: 28 mmol/L (ref 22–32)
Calcium: 9.7 mg/dL (ref 8.9–10.3)
Chloride: 102 mmol/L (ref 98–111)
Creatinine, Ser: 0.73 mg/dL (ref 0.44–1.00)
GFR calc Af Amer: >60 mL/min (ref >60)
GFR calc non Af Amer: >60 mL/min (ref >60)
Glucose, Bld: 225 mg/dL — ABNORMAL HIGH (ref 70–99)
Potassium: 3.6 mmol/L (ref 3.5–5.1)
Sodium: 140 mmol/L (ref 135–145)

## 2018-12-01 LAB — TYPE AND SCREEN
ABO/RH(D): O NEG
Antibody Screen: NEGATIVE

## 2018-12-01 LAB — APTT: aPTT: 30 seconds (ref 24–36)

## 2018-12-01 LAB — PROTIME-INR
INR: 1.02
Prothrombin Time: 13.3 seconds (ref 11.4–15.2)

## 2018-12-01 MED ORDER — CEFAZOLIN SODIUM-DEXTROSE 2-4 GM/100ML-% IV SOLN
2.0000 g | INTRAVENOUS | Status: AC
  Administered 2018-12-02: 2 g via INTRAVENOUS

## 2018-12-01 NOTE — Patient Instructions (Signed)
Your procedure is scheduled on:  Tuesday, Feb. 18 Report to Day Surgery on the 2nd floor of the [facility] at 11:30 am  [phone]   REMEMBER: Instructions that are not followed completely may result in serious medical risk, up to and including death; or upon the discretion of your surgeon and anesthesiologist your surgery may need to be rescheduled.  Do not eat food after midnight the night before surgery.  No gum chewing, lozengers or hard candies.  You may however, drink water up to 2 hours before you are scheduled to arrive for your surgery. Do not drink anything within 2 hours of the start of your surgery.   No Alcohol for 24 hours before or after surgery.  No Smoking including e-cigarettes for 24 hours prior to surgery.  No chewable tobacco products for at least 6 hours prior to surgery.  No nicotine patches on the day of surgery.  On the morning of surgery brush your teeth with toothpaste and water, you may rinse your mouth with mouthwash if you wish. Do not swallow any toothpaste or mouthwash.  Notify your doctor if there is any change in your medical condition (cold, fever, infection).  Do not wear jewelry, make-up, hairpins, clips or nail polish.  Do not wear lotions, powders, or perfumes.   Do not shave 48 hours prior to surgery.   Contacts and dentures may not be worn into surgery.  Do not bring valuables to the hospital, including drivers license, insurance or credit cards.  [facility] is not responsible for any belongings or valuables.   TAKE THESE MEDICATIONS THE MORNING OF SURGERY:  1.  Amlodipine 2.  Metoprolol  Use CHG Soap as directed on instruction sheet.  Stop Metformin prior to surgery. NOW  NOW!  Stop Anti-inflammatories (NSAIDS) such as Advil, Aleve, Ibuprofen, Motrin, Naproxen, Naprosyn and Aspirin based products such as Excedrin, Goodys Powder, BC Powder. (May take Tylenol or Acetaminophen if needed.)  NOW!  Stop ANY OVER THE COUNTER  supplements until after surgery.  Wear comfortable clothing (specific to your surgery type) to the hospital.  If you are being discharged the day of surgery, you will not be allowed to drive home. You will need a responsible adult to drive you home and stay with you that night.   If you are taking public transportation, you will need to have a responsible adult with you. Please confirm with your physician that it is acceptable to use public transportation.   Please call [phone] if you have any questions about these instructions.

## 2018-12-01 NOTE — Telephone Encounter (Signed)
Sherry from anesthesia called stating the patient had not stopped her Plavix or Aspirin. I contacted the patient and let her know not to take them today or tomorrow, patient understood. Spoke with Hezzie Bump PA.

## 2018-12-02 ENCOUNTER — Other Ambulatory Visit: Payer: Self-pay

## 2018-12-02 ENCOUNTER — Ambulatory Visit: Payer: PPO

## 2018-12-02 ENCOUNTER — Encounter
Admission: RE | Disposition: A | Discharge status: Home / Self Care | Payer: Self-pay | Source: Home / Self Care | Attending: Vascular Surgery

## 2018-12-02 ENCOUNTER — Encounter: Payer: Self-pay | Admitting: *Deleted

## 2018-12-02 ENCOUNTER — Ambulatory Visit
Admission: RE | Admit: 2018-12-02 | Discharge: 2018-12-02 | Disposition: A | Discharge status: Home / Self Care | Payer: PPO | Attending: Vascular Surgery | Admitting: Vascular Surgery

## 2018-12-02 DIAGNOSIS — Z7984 Long term (current) use of oral hypoglycemic drugs: Secondary | ICD-10-CM | POA: Insufficient documentation

## 2018-12-02 DIAGNOSIS — T8131XA Disruption of external operation (surgical) wound, not elsewhere classified, initial encounter: Secondary | ICD-10-CM | POA: Diagnosis not present

## 2018-12-02 DIAGNOSIS — I2510 Atherosclerotic heart disease of native coronary artery without angina pectoris: Secondary | ICD-10-CM | POA: Insufficient documentation

## 2018-12-02 DIAGNOSIS — Z87891 Personal history of nicotine dependence: Secondary | ICD-10-CM | POA: Insufficient documentation

## 2018-12-02 DIAGNOSIS — E1151 Type 2 diabetes mellitus with diabetic peripheral angiopathy without gangrene: Secondary | ICD-10-CM | POA: Diagnosis not present

## 2018-12-02 DIAGNOSIS — Z85828 Personal history of other malignant neoplasm of skin: Secondary | ICD-10-CM | POA: Insufficient documentation

## 2018-12-02 DIAGNOSIS — E785 Hyperlipidemia, unspecified: Secondary | ICD-10-CM | POA: Diagnosis not present

## 2018-12-02 DIAGNOSIS — I110 Hypertensive heart disease with heart failure: Secondary | ICD-10-CM | POA: Diagnosis not present

## 2018-12-02 DIAGNOSIS — Y838 Other surgical procedures as the cause of abnormal reaction of the patient, or of later complication, without mention of misadventure at the time of the procedure: Secondary | ICD-10-CM | POA: Insufficient documentation

## 2018-12-02 DIAGNOSIS — I509 Heart failure, unspecified: Secondary | ICD-10-CM | POA: Diagnosis not present

## 2018-12-02 DIAGNOSIS — T8130XA Disruption of wound, unspecified, initial encounter: Secondary | ICD-10-CM | POA: Diagnosis not present

## 2018-12-02 DIAGNOSIS — T8189XA Other complications of procedures, not elsewhere classified, initial encounter: Secondary | ICD-10-CM | POA: Diagnosis not present

## 2018-12-02 DIAGNOSIS — E782 Mixed hyperlipidemia: Secondary | ICD-10-CM | POA: Insufficient documentation

## 2018-12-02 DIAGNOSIS — Z955 Presence of coronary angioplasty implant and graft: Secondary | ICD-10-CM | POA: Insufficient documentation

## 2018-12-02 HISTORY — PX: APPLICATION OF WOUND VAC: SHX5189

## 2018-12-02 HISTORY — PX: WOUND DEBRIDEMENT: SHX247

## 2018-12-02 LAB — GLUCOSE, CAPILLARY: GLUCOSE-CAPILLARY: 113 mg/dL — AB (ref 70–99)

## 2018-12-02 SURGERY — DEBRIDEMENT, WOUND
Anesthesia: General | Site: Groin | Laterality: Bilateral

## 2018-12-02 MED ORDER — MIDAZOLAM HCL 2 MG/2ML IJ SOLN
INTRAMUSCULAR | Status: DC | PRN
  Administered 2018-12-02 (×2): 1 mg via INTRAVENOUS

## 2018-12-02 MED ORDER — PROPOFOL 500 MG/50ML IV EMUL
INTRAVENOUS | Status: DC | PRN
  Administered 2018-12-02: 150 ug/kg/min via INTRAVENOUS

## 2018-12-02 MED ORDER — BUPIVACAINE LIPOSOME 1.3 % IJ SUSP
INTRAMUSCULAR | Status: AC
  Filled 2018-12-02: qty 20

## 2018-12-02 MED ORDER — BUPIVACAINE LIPOSOME 1.3 % IJ SUSP
INTRAMUSCULAR | Status: DC | PRN
  Administered 2018-12-02: 50 mL

## 2018-12-02 MED ORDER — OXYCODONE HCL 5 MG/5ML PO SOLN: 5.0000 mg | Freq: Once | ORAL | Status: DC | PRN

## 2018-12-02 MED ORDER — ONDANSETRON HCL 4 MG/2ML IJ SOLN
INTRAMUSCULAR | Status: DC | PRN
  Administered 2018-12-02: 4 mg via INTRAVENOUS

## 2018-12-02 MED ORDER — CHLORHEXIDINE GLUCONATE CLOTH 2 % EX PADS: 6.0000 | MEDICATED_PAD | Freq: Once | CUTANEOUS | Status: DC

## 2018-12-02 MED ORDER — PROPOFOL 500 MG/50ML IV EMUL
INTRAVENOUS | Status: AC
  Filled 2018-12-02: qty 50

## 2018-12-02 MED ORDER — DEXMEDETOMIDINE HCL IN NACL 200 MCG/50ML IV SOLN
INTRAVENOUS | Status: DC | PRN
  Administered 2018-12-02 (×2): 8 ug via INTRAVENOUS

## 2018-12-02 MED ORDER — DEXAMETHASONE SODIUM PHOSPHATE 10 MG/ML IJ SOLN
INTRAMUSCULAR | Status: DC | PRN
  Administered 2018-12-02: 5 mg via INTRAVENOUS

## 2018-12-02 MED ORDER — PROPOFOL 10 MG/ML IV BOLUS
INTRAVENOUS | Status: DC | PRN
  Administered 2018-12-02: 120 mg via INTRAVENOUS
  Administered 2018-12-02: 50 mg via INTRAVENOUS

## 2018-12-02 MED ORDER — BUPIVACAINE HCL (PF) 0.5 % IJ SOLN
INTRAMUSCULAR | Status: AC
  Filled 2018-12-02: qty 30

## 2018-12-02 MED ORDER — PHENYLEPHRINE HCL 10 MG/ML IJ SOLN
INTRAMUSCULAR | Status: DC | PRN
  Administered 2018-12-02: 50 ug via INTRAVENOUS
  Administered 2018-12-02: 150 ug via INTRAVENOUS
  Administered 2018-12-02 (×2): 100 ug via INTRAVENOUS
  Administered 2018-12-02: 150 ug via INTRAVENOUS
  Administered 2018-12-02 (×2): 100 ug via INTRAVENOUS

## 2018-12-02 MED ORDER — FAMOTIDINE 20 MG PO TABS
ORAL_TABLET | ORAL | Status: AC
  Administered 2018-12-02: 20 mg via ORAL
  Filled 2018-12-02: qty 1

## 2018-12-02 MED ORDER — FENTANYL CITRATE (PF) 100 MCG/2ML IJ SOLN
INTRAMUSCULAR | Status: DC | PRN
  Administered 2018-12-02: 12.5 ug via INTRAVENOUS
  Administered 2018-12-02: 25 ug via INTRAVENOUS
  Administered 2018-12-02: 12.5 ug via INTRAVENOUS

## 2018-12-02 MED ORDER — FENTANYL CITRATE (PF) 100 MCG/2ML IJ SOLN: 25.0000 ug | INTRAMUSCULAR | Status: DC | PRN

## 2018-12-02 MED ORDER — PROPOFOL 10 MG/ML IV BOLUS
INTRAVENOUS | Status: AC
  Filled 2018-12-02: qty 20

## 2018-12-02 MED ORDER — SODIUM CHLORIDE 0.9 % IV SOLN
INTRAVENOUS | Status: DC | PRN
  Administered 2018-12-02: 30 ug/min via INTRAVENOUS

## 2018-12-02 MED ORDER — MEPERIDINE HCL 50 MG/ML IJ SOLN: 6.2500 mg | INTRAMUSCULAR | Status: DC | PRN

## 2018-12-02 MED ORDER — FAMOTIDINE 20 MG PO TABS
20.0000 mg | ORAL_TABLET | Freq: Once | ORAL | Status: AC
  Administered 2018-12-02: 20 mg via ORAL

## 2018-12-02 MED ORDER — OXYCODONE HCL 5 MG PO TABS: 5.0000 mg | ORAL_TABLET | Freq: Once | ORAL | Status: DC | PRN

## 2018-12-02 MED ORDER — PROMETHAZINE HCL 25 MG/ML IJ SOLN: 6.2500 mg | INTRAMUSCULAR | Status: DC | PRN

## 2018-12-02 MED ORDER — CEFAZOLIN SODIUM-DEXTROSE 2-4 GM/100ML-% IV SOLN
INTRAVENOUS | Status: AC
  Filled 2018-12-02: qty 100

## 2018-12-02 MED ORDER — LIDOCAINE HCL (PF) 2 % IJ SOLN
INTRAMUSCULAR | Status: AC
  Filled 2018-12-02: qty 10

## 2018-12-02 MED ORDER — ONDANSETRON HCL 4 MG/2ML IJ SOLN
INTRAMUSCULAR | Status: AC
  Filled 2018-12-02: qty 2

## 2018-12-02 MED ORDER — LACTATED RINGERS IV SOLN
INTRAVENOUS | Status: DC | PRN
  Administered 2018-12-02 (×2): via INTRAVENOUS

## 2018-12-02 MED ORDER — LIDOCAINE HCL (CARDIAC) PF 100 MG/5ML IV SOSY
PREFILLED_SYRINGE | INTRAVENOUS | Status: DC | PRN
  Administered 2018-12-02: 80 mg via INTRAVENOUS

## 2018-12-02 MED ORDER — FENTANYL CITRATE (PF) 100 MCG/2ML IJ SOLN
INTRAMUSCULAR | Status: AC
  Filled 2018-12-02: qty 2

## 2018-12-02 MED ORDER — MIDAZOLAM HCL 2 MG/2ML IJ SOLN
INTRAMUSCULAR | Status: AC
  Filled 2018-12-02: qty 2

## 2018-12-02 MED ORDER — EPHEDRINE SULFATE 50 MG/ML IJ SOLN
INTRAMUSCULAR | Status: DC | PRN
  Administered 2018-12-02: 5 mg via INTRAVENOUS
  Administered 2018-12-02: 10 mg via INTRAVENOUS

## 2018-12-02 MED ORDER — SODIUM CHLORIDE 0.9 % IV SOLN
INTRAVENOUS | Status: DC
  Administered 2018-12-02: 12:00:00 via INTRAVENOUS

## 2018-12-02 SURGICAL SUPPLY — 33 items
BLADE SURG 15 STRL LF DISP TIS (BLADE) ×4 IMPLANT
BLADE SURG 15 STRL SS (BLADE) ×2
BNDG COHESIVE 6X5 TAN STRL LF (GAUZE/BANDAGES/DRESSINGS) IMPLANT
BNDG CONFORM 2 STRL LF (GAUZE/BANDAGES/DRESSINGS) IMPLANT
CANISTER SUCT 1200ML W/VALVE (MISCELLANEOUS) ×3 IMPLANT
CHLORAPREP W/TINT 26ML (MISCELLANEOUS) IMPLANT
COVER WAND RF STERILE (DRAPES) IMPLANT
DRAPE INCISE IOBAN 66X45 STRL (DRAPES) ×3 IMPLANT
DRAPE UNIVERSAL PACK (DRAPES) ×3 IMPLANT
DRSG EMULSION OIL 3X3 NADH (GAUZE/BANDAGES/DRESSINGS) IMPLANT
DRSG VAC ATS MED SENSATRAC (GAUZE/BANDAGES/DRESSINGS) IMPLANT
ELECT CAUTERY BLADE 6.4 (BLADE) ×3 IMPLANT
ELECT REM PT RETURN 9FT ADLT (ELECTROSURGICAL) ×3
ELECTRODE REM PT RTRN 9FT ADLT (ELECTROSURGICAL) ×2 IMPLANT
GAUZE SPONGE 4X4 12PLY STRL (GAUZE/BANDAGES/DRESSINGS) IMPLANT
GLOVE SURG SYN 8.0 (GLOVE) ×6 IMPLANT
GOWN STRL REUS W/ TWL LRG LVL3 (GOWN DISPOSABLE) ×4 IMPLANT
GOWN STRL REUS W/ TWL XL LVL3 (GOWN DISPOSABLE) ×2 IMPLANT
GOWN STRL REUS W/TWL LRG LVL3 (GOWN DISPOSABLE) ×2
GOWN STRL REUS W/TWL XL LVL3 (GOWN DISPOSABLE) ×1
KIT TURNOVER KIT A (KITS) ×3 IMPLANT
LABEL OR SOLS (LABEL) ×3 IMPLANT
NEEDLE HYPO 22GX1.5 SAFETY (NEEDLE) ×3 IMPLANT
NS IRRIG 500ML POUR BTL (IV SOLUTION) ×3 IMPLANT
PACK BASIN MAJOR ARMC (MISCELLANEOUS) ×3 IMPLANT
PACK EXTREMITY ARMC (MISCELLANEOUS) IMPLANT
PAD PREP 24X41 OB/GYN DISP (PERSONAL CARE ITEMS) ×3 IMPLANT
SOL PREP PVP 2OZ (MISCELLANEOUS) ×3
SOLUTION PREP PVP 2OZ (MISCELLANEOUS) ×2 IMPLANT
STOCKINETTE IMPERV 14X48 (MISCELLANEOUS) IMPLANT
SYR 30ML LL (SYRINGE) ×3 IMPLANT
SYR BULB 3OZ (MISCELLANEOUS) ×3 IMPLANT
WND VAC CANISTER 500ML (MISCELLANEOUS) IMPLANT

## 2018-12-02 NOTE — Anesthesia Postprocedure Evaluation (Signed)
Anesthesia Post Note  Patient: Verdean Murin  Procedure(s) Performed: DEBRIDEMENT WOUND (Bilateral Groin) APPLICATION OF WOUND VAC (Bilateral )  Patient location during evaluation: PACU Anesthesia Type: General Level of consciousness: awake and alert and oriented Pain management: pain level controlled Vital Signs Assessment: post-procedure vital signs reviewed and stable Respiratory status: spontaneous breathing, nonlabored ventilation and respiratory function stable Cardiovascular status: blood pressure returned to baseline and stable Postop Assessment: no signs of nausea or vomiting Anesthetic complications: no     Last Vitals:  Vitals:   12/02/18 1542 12/02/18 1607  BP: 133/62 137/70  Pulse: 76 74  Resp: 17 18  Temp: 36.6 C   SpO2: 94% 99%    Last Pain:  Vitals:   12/02/18 1607  TempSrc:   PainSc: 0-No pain                 Kynzi Levay

## 2018-12-02 NOTE — Discharge Instructions (Signed)
Surgical Wound Debridement, Care After Refer to this sheet in the next few weeks. These instructions provide you with information about caring for yourself after your procedure. Your health care provider may also give you more specific instructions. Your treatment has been planned according to current medical practices, but problems sometimes occur. Call your health care provider if you have any problems or questions after your procedure. What can I expect after the procedure? After the procedure, it is common to have:  Pain or soreness.  Fluid that leaks from the wound.  Stiffness. Follow these instructions at home: Medicines  Take over-the-counter and prescription medicines only as told by your health care provider.  If you were prescribed an antibiotic medicine, take it or apply it as told by your health care provider. Do not stop taking or using the antibiotic even if your condition improves. Wound care  Follow instructions from your health care provider about: ? How to take care of your wound. ? When and how you should change your dressing. ? When you should remove your dressing. If your dressing is dry and stuck when you try to remove it, moisten or wet the dressing with saline or water so that it can be removed without harming your skin or wound tissue.  Check your wound every day for signs of infection. Have a caregiver do this for you if you are not able. Watch for: ? More redness, swelling, or pain. ? More fluid, blood, or pus. ? A bad smell. Activity   Do not drive or operate heavy machinery while taking prescription pain medicine.  Ask your health care provider what activities are safe for you. General instructions  Eat a healthy diet with lots of protein. Ask your health care provider to suggest the best diet for you.  Do not smoke. Smoking makes it harder for your body to heal.  Keep all follow-up visits as told by your health care provider. This is  important.  Do not take baths, swim, or use a hot tub until your health care provider approves. Contact a health care provider if:  You have a fever.  Your pain medicine is not helping.  Your wound is red and swollen.  You have increased bleeding.  You have pus coming from your wound.  You have a bad smell coming from your wound.  Your wound is not getting better after 1-2 weeks of treatment.  You develop a new medical condition, such as diabetes, peripheral vascular disease, or conditions that affect your defense (immune) system. This information is not intended to replace advice given to you by your health care provider. Make sure you discuss any questions you have with your health care provider. Document Released: 09/17/2012 Document Revised: 03/07/2016 Document Reviewed: 02/09/2015 Elsevier Interactive Patient Education  2019 [employer]   Negative Pressure Wound Therapy Dressing Care Negative pressure wound therapy (NPWT) is a device that helps wounds heal. NPWT helps the wound stay clean and healthy while it heals from the inside. NPWT uses a bandage (dressing) that is made of a sponge or gauze-like material. The dressing is placed in or inside the wound. The wound is then covered and sealed with a cover dressing that sticks to your skin (adhesive). This keeps air out. A tube connects the cover dressing to a small pump. The pump sucks fluid and germs from the wound. The pump also controls any odor coming from the wound. What are the risks? NPWT is usually safe to use. The most  common problem is skin irritation from the dressing adhesive, but there are many ways to prevent this from happening. However, more serious problems can develop, such as:  Bleeding.  Infection.  Dehydration.  Pain. How to change your dressing How often you change your dressing depends on your wound. If the pump is off for more than two hours, the dressing will need to be changed. Follow your  health care providers instructions on how often to change it. Your health care provider may change your dressing, or a family member, friend, or caregiver may be shown how to change the dressing. It is important to:  Wear gloves and protective clothing while changing a dressing. This may include eye protection.  Never let anyone change your dressing if he or she has an infection, skin condition, or skin wound or cut of any size. Preparing to change your dressing  If needed, take pain medicine 30 minutes before the dressing change as prescribed by your health care provider.  Set up a clean station for wound care. You will need: ? A disposable garbage bag that is open and ready to use. ? Hand sanitizer. ? Wound cleanser or saltwater solution (saline) as told by your health care provider. ? New dressing material or bandages. Make sure to open the dressing package so that the dressing remains on the inside of the package. You may also need the following in your clean station:  A box of vinyl gloves.  Tape.  Skin protectant. This may be a wipe, film, or spray.  Clean or germ-free (sterile) scissors.  Wound liner.  Cotton tip applicators. Removing your old dressing  Wash your hands with soap and water. Dry your hands with a clean towel. If soap and water are not available, use hand sanitizer.  Put on gloves.  Turn off the pump and disconnect the tubing from the dressing.  Carefully remove the adhesive cover dressing in the direction of your hair growth. Only touch the outside edges of the dressing.  Remove the dressing that is inside the wound. If the dressing sticks, use a wound cleanser or saline solution to wet the dressing. This helps it come off more easily.  Throw the old dressing supplies into the ready garbage bag.  Remove your gloves by grabbing the cuff and turning the glove inside out. Place the gloves in the trash immediately.  Wash your hands with soap and water.  Dry your hands with a clean towel. If soap and water are not available, use hand sanitizer. Cleaning your wound  Follow your health care provider's instructions on how to clean your wound. This may include using a saline or recommended wound cleanser.  Do not use over-the-counter medicated or antiseptic creams, sprays, liquids, or dressings unless told to do so by your health care provider.  Clean the area thoroughly with the recommended saline solution or wound cleanser and a clean gauze pad.  Throw the gauze pad into the garbage bag.  Wash your hands with soap and water. Dry your hands with a clean towel. If soap and water are not available, use hand sanitizer. Applying the dressing  Apply a skin protectant to any skin that will be exposed to adhesive. Let the skin protectant dry.  Put a new dressing into the wound.  Apply a new cover dressing and tube.  Take off your gloves. Put them in the plastic bag with the old dressing. Tie the bag shut and throw it away.  Wash your hands with  soap and water. Dry your hands with a clean towel. If soap and water are not available, use hand sanitizer.  Attach the suction and turn the pump back on. Do not change the settings on the machine without talking to a health care provider.  Replace the container in the pump that collects fluid if it is full. Do this at least once a week. Contact a health care provider if:  You have new pain.  You develop irritation, a rash, or itching around the wound or dressing.  You see new black or yellow tissue in your wound.  The dressing changes are painful or cause bleeding.  The pump has been off for more than two hours and you do not know how to change the dressing.  The alarm for the pump goes off and you do not know what to do. Get help right away if:  You have a lot of bleeding.  You see a sudden change in the color or texture of the drainage.  The wound breaks open.  You have severe  pain.  You have signs of infection, such as: ? More redness, swelling, or pain. ? More fluid or blood. ? Warmth. ? Pus or a bad smell. ? Red streaks leading from wound. ? A fever. This information is not intended to replace advice given to you by your health care provider. Make sure you discuss any questions you have with your health care provider. Document Released: 12/24/2011 Document Revised: 10/27/2015 Document Reviewed: 07/07/2015 Elsevier Interactive Patient Education  2019 [place]   1) The drugs that you were given will stay in your system until tomorrow so for the next 24 hours you should not:  A) Drive an automobile B) Make any legal decisions C) Drink any alcoholic beverage   2) You may resume regular meals tomorrow.  Today it is better to start with liquids and gradually work up to solid foods.  You may eat anything you prefer, but it is better to start with liquids, then soup and crackers, and gradually work up to solid foods.   3) Please notify your doctor immediately if you have any unusual bleeding, trouble breathing, redness and pain at the surgery site, drainage, fever, or pain not relieved by medication.    4) Additional Instructions:        Please contact your physician with any problems or Same Day Surgery at [phone], Monday through Friday 6 am to 4 pm, or [facility] number at [phone].

## 2018-12-02 NOTE — Op Note (Signed)
    OPERATIVE NOTE   PROCEDURE: Excisional debridement of skin and soft tissue and tendon bilateral groins  PRE-OPERATIVE DIAGNOSIS: Necrosis of the incisional wounds bilateral femoral endarterectomy  POST-OPERATIVE DIAGNOSIS: Same  SURGEON: [name]  ASSISTANT(S): Ms. Hezzie Bump  ANESTHESIA: general  ESTIMATED BLOOD LOSS: Less than 10 cc  FINDING(S): Necrotic tissue including skin and soft tissue tendon  SPECIMEN(S): None  INDICATIONS:   Christina Blackburn is a 73 y.o. female who presents with wound dehiscence and necrosis of both groin incisions bilaterally.  DESCRIPTION: After full informed written consent was obtained from the patient, the patient was brought back to the operating room and placed supine upon the operating table.  Prior to induction, the patient received IV antibiotics.   After obtaining adequate anesthesia, the patient was then prepped and draped in the standard fashion for a bilateral excisional debridement of the femoral endarterectomy incisions.  Left femoral is addressed first.  Using a combination of a 15 blade scalpel as well as Metzenbaum scissors skin subcutaneous tissue including tendon is debrided.  Once the necrotic tissue has been removed hemostasis is obtained with Bovie cautery and the wound is irrigated with sterile saline.  The left wound measures 12 cm x 4 cm x 2-1/2 cm.    Right femoral is addressed second.  Using a combination of a 15 blade scalpel as well as Metzenbaum scissors skin subcutaneous tissue including tendon is debrided.  Once the necrotic tissue has been removed hemostasis is obtained with Bovie cautery and the wound is irrigated with sterile saline.  The left wound measures 10 cm x 3 cm x 2-1/2 cm.  A total of 50 cc of Marcaine with Exparel is infused into the soft tissue both wounds.  Wound vacs are then placed in both wounds and a bridge is utilized to connect the 2.   The patient tolerated this procedure well.    COMPLICATIONS: None  CONDITION: [facility]  Office: [phone]   12/02/2018, 2:27 PM

## 2018-12-02 NOTE — Anesthesia Post-op Follow-up Note (Signed)
Anesthesia QCDR form completed.        

## 2018-12-02 NOTE — Anesthesia Preprocedure Evaluation (Signed)
Anesthesia Evaluation  Patient identified by MRN, date of birth, ID band Patient awake    Reviewed: Allergy & Precautions, NPO status , Patient's Chart, lab work & pertinent test results  History of Anesthesia Complications (+) PONV and history of anesthetic complications  Airway Mallampati: III  TM Distance: >3 FB Neck ROM: Full    Dental  (+) Upper Dentures, Poor Dentition, Missing   Pulmonary neg sleep apnea, neg COPD, former smoker,    breath sounds clear to auscultation- rhonchi (-) wheezing      Cardiovascular hypertension, + CAD, + Cardiac Stents (12 yrs ago), + Peripheral Vascular Disease and +CHF   Rhythm:Regular Rate:Normal - Systolic murmurs and - Diastolic murmurs NM stress test 09/30/18: 1. Mild left ventricular function 2. Normal wall motion 3. No evidence for scar or ischemia   Neuro/Psych neg Seizures negative neurological ROS  negative psych ROS   GI/Hepatic negative GI ROS, Neg liver ROS,   Endo/Other  diabetes, Oral Hypoglycemic Agents  Renal/GU negative Renal ROS     Musculoskeletal negative musculoskeletal ROS (+)   Abdominal (+) + obese,   Peds  Hematology  (+) anemia ,   Anesthesia Other Findings Past Medical History: No date: Anemia No date: CHF (congestive heart failure) (HCC) No date: Chicken pox No date: Colon polyps No date: Complication of anesthesia No date: Coronary artery disease 1986: Cystitis 2001: Diabetes mellitus without complication ([place]) No date: Diverticulosis 2008: Heart disease No date: Hyperlipidemia 2003: Hypertension No date: PAD (peripheral artery disease) (HCC) No date: PONV (postoperative nausea and vomiting) No date: Rotator cuff tendinitis 2016: Squamous cell skin cancer   Reproductive/Obstetrics                             Anesthesia Physical Anesthesia Plan  ASA: III  Anesthesia Plan: General   Post-op Pain  Management:    Induction: Intravenous  PONV Risk Score and Plan: 3 and Propofol infusion and TIVA  Airway Management Planned: LMA  Additional Equipment:   Intra-op Plan:   Post-operative Plan:   Informed Consent: I have reviewed the patients History and Physical, chart, labs and discussed the procedure including the risks, benefits and alternatives for the proposed anesthesia with the patient or authorized representative who has indicated his/her understanding and acceptance.     Dental advisory given  Plan Discussed with: CRNA and Anesthesiologist  Anesthesia Plan Comments:         Anesthesia Quick Evaluation

## 2018-12-02 NOTE — Progress Notes (Signed)
Patient given diet soda. 

## 2018-12-02 NOTE — Anesthesia Procedure Notes (Signed)
Procedure Name: LMA Insertion Date/Time: 12/02/2018 1:34 PM Performed by: Emmie Niemann, MD Pre-anesthesia Checklist: Patient identified, Emergency Drugs available, Suction available, Patient being monitored and Timeout performed Patient Re-evaluated:Patient Re-evaluated prior to induction Oxygen Delivery Method: Circle system utilized Preoxygenation: Pre-oxygenation with 100% oxygen Induction Type: IV induction Ventilation: Mask ventilation without difficulty LMA: LMA inserted LMA Size: 3.5 Number of attempts: 1

## 2018-12-02 NOTE — Transfer of Care (Addendum)
Immediate Anesthesia Transfer of Care Note  Patient: Christina Blackburn  Procedure(s) Performed: DEBRIDEMENT WOUND (Bilateral Groin) APPLICATION OF WOUND VAC (Bilateral )  Patient Location: PACU  Anesthesia Type:General  Level of Consciousness: awake and drowsy  Airway & Oxygen Therapy: Patient Spontanous Breathing and Patient connected to face mask oxygen  Post-op Assessment: Report given to RN and Post -op Vital signs reviewed and stable  Post vital signs: Reviewed and stable  Last Vitals:  Vitals Value Taken Time  BP 90/51 12/02/2018  2:51 PM  Temp    Pulse 79 12/02/2018  2:51 PM  Resp 23 12/02/2018  2:51 PM  SpO2 99 % 12/02/2018  2:51 PM  Vitals shown include unvalidated device data.  Last Pain:  Vitals:   12/02/18 1145  TempSrc: Oral  PainSc: 0-No pain         Complications: No apparent anesthesia complications

## 2018-12-02 NOTE — H&P (Signed)
[facility] History & Physical Update  The patient was interviewed and re-examined.  The patient's previous History and Physical has been reviewed and is unchanged.  There is no change in the plan of care. We plan to proceed with the scheduled procedure.  [name], MD  12/02/2018, 1:20 PM

## 2018-12-03 ENCOUNTER — Encounter: Payer: Self-pay | Admitting: Vascular Surgery

## 2018-12-03 DIAGNOSIS — Z9851 Tubal ligation status: Secondary | ICD-10-CM | POA: Diagnosis not present

## 2018-12-03 DIAGNOSIS — K5780 Diverticulitis of intestine, part unspecified, with perforation and abscess without bleeding: Secondary | ICD-10-CM | POA: Diagnosis not present

## 2018-12-03 DIAGNOSIS — E1151 Type 2 diabetes mellitus with diabetic peripheral angiopathy without gangrene: Secondary | ICD-10-CM | POA: Diagnosis not present

## 2018-12-03 DIAGNOSIS — Z87891 Personal history of nicotine dependence: Secondary | ICD-10-CM | POA: Diagnosis not present

## 2018-12-03 DIAGNOSIS — T8149XA Infection following a procedure, other surgical site, initial encounter: Secondary | ICD-10-CM | POA: Diagnosis not present

## 2018-12-03 DIAGNOSIS — Z7984 Long term (current) use of oral hypoglycemic drugs: Secondary | ICD-10-CM | POA: Diagnosis not present

## 2018-12-03 DIAGNOSIS — Z9049 Acquired absence of other specified parts of digestive tract: Secondary | ICD-10-CM | POA: Diagnosis not present

## 2018-12-03 DIAGNOSIS — Z85828 Personal history of other malignant neoplasm of skin: Secondary | ICD-10-CM | POA: Diagnosis not present

## 2018-12-03 DIAGNOSIS — Z96611 Presence of right artificial shoulder joint: Secondary | ICD-10-CM | POA: Diagnosis not present

## 2018-12-03 DIAGNOSIS — Z87440 Personal history of urinary (tract) infections: Secondary | ICD-10-CM | POA: Diagnosis not present

## 2018-12-03 DIAGNOSIS — I110 Hypertensive heart disease with heart failure: Secondary | ICD-10-CM | POA: Diagnosis not present

## 2018-12-03 DIAGNOSIS — M75120 Complete rotator cuff tear or rupture of unspecified shoulder, not specified as traumatic: Secondary | ICD-10-CM | POA: Diagnosis not present

## 2018-12-03 DIAGNOSIS — I2510 Atherosclerotic heart disease of native coronary artery without angina pectoris: Secondary | ICD-10-CM | POA: Diagnosis not present

## 2018-12-03 DIAGNOSIS — Z7902 Long term (current) use of antithrombotics/antiplatelets: Secondary | ICD-10-CM | POA: Diagnosis not present

## 2018-12-03 DIAGNOSIS — I509 Heart failure, unspecified: Secondary | ICD-10-CM | POA: Diagnosis not present

## 2018-12-03 DIAGNOSIS — Z7982 Long term (current) use of aspirin: Secondary | ICD-10-CM | POA: Diagnosis not present

## 2018-12-03 DIAGNOSIS — Z6835 Body mass index (BMI) 35.0-35.9, adult: Secondary | ICD-10-CM | POA: Diagnosis not present

## 2018-12-03 DIAGNOSIS — B029 Zoster without complications: Secondary | ICD-10-CM | POA: Diagnosis not present

## 2018-12-03 DIAGNOSIS — E782 Mixed hyperlipidemia: Secondary | ICD-10-CM | POA: Diagnosis not present

## 2018-12-03 DIAGNOSIS — Z86010 Personal history of colonic polyps: Secondary | ICD-10-CM | POA: Diagnosis not present

## 2018-12-03 DIAGNOSIS — T8132XA Disruption of internal operation (surgical) wound, not elsewhere classified, initial encounter: Secondary | ICD-10-CM | POA: Diagnosis not present

## 2018-12-03 DIAGNOSIS — Z90710 Acquired absence of both cervix and uterus: Secondary | ICD-10-CM | POA: Diagnosis not present

## 2018-12-03 DIAGNOSIS — Z98891 History of uterine scar from previous surgery: Secondary | ICD-10-CM | POA: Diagnosis not present

## 2018-12-08 ENCOUNTER — Telehealth (INDEPENDENT_AMBULATORY_CARE_PROVIDER_SITE_OTHER): Payer: Self-pay

## 2018-12-08 DIAGNOSIS — T8149XA Infection following a procedure, other surgical site, initial encounter: Secondary | ICD-10-CM | POA: Diagnosis not present

## 2018-12-08 NOTE — Telephone Encounter (Signed)
Patient's referral paperwork has been faxed to [facility]. [facility] will contact the patient to set up visits.

## 2018-12-08 NOTE — Telephone Encounter (Signed)
Patient left a message stating that when the homehealth comes out to change her wound vac she is charged a $50 co-pay each time and she has been approved for 15 visits. Patient would like to be switched to [facility] or [facility]. I have contacted [facility] and I am waiting on someone to call me back so we can get the patient set up for home visits to change her wound vac.

## 2018-12-10 DIAGNOSIS — I509 Heart failure, unspecified: Secondary | ICD-10-CM | POA: Diagnosis not present

## 2018-12-10 DIAGNOSIS — Z96611 Presence of right artificial shoulder joint: Secondary | ICD-10-CM | POA: Diagnosis not present

## 2018-12-10 DIAGNOSIS — Z7982 Long term (current) use of aspirin: Secondary | ICD-10-CM | POA: Diagnosis not present

## 2018-12-10 DIAGNOSIS — E782 Mixed hyperlipidemia: Secondary | ICD-10-CM | POA: Diagnosis not present

## 2018-12-10 DIAGNOSIS — K5790 Diverticulosis of intestine, part unspecified, without perforation or abscess without bleeding: Secondary | ICD-10-CM | POA: Diagnosis not present

## 2018-12-10 DIAGNOSIS — E1151 Type 2 diabetes mellitus with diabetic peripheral angiopathy without gangrene: Secondary | ICD-10-CM | POA: Diagnosis not present

## 2018-12-10 DIAGNOSIS — T8141XA Infection following a procedure, superficial incisional surgical site, initial encounter: Secondary | ICD-10-CM | POA: Diagnosis not present

## 2018-12-10 DIAGNOSIS — Z7902 Long term (current) use of antithrombotics/antiplatelets: Secondary | ICD-10-CM | POA: Diagnosis not present

## 2018-12-10 DIAGNOSIS — Z6834 Body mass index (BMI) 34.0-34.9, adult: Secondary | ICD-10-CM | POA: Diagnosis not present

## 2018-12-10 DIAGNOSIS — Z955 Presence of coronary angioplasty implant and graft: Secondary | ICD-10-CM | POA: Diagnosis not present

## 2018-12-10 DIAGNOSIS — E669 Obesity, unspecified: Secondary | ICD-10-CM | POA: Diagnosis not present

## 2018-12-10 DIAGNOSIS — I110 Hypertensive heart disease with heart failure: Secondary | ICD-10-CM | POA: Diagnosis not present

## 2018-12-10 DIAGNOSIS — I2510 Atherosclerotic heart disease of native coronary artery without angina pectoris: Secondary | ICD-10-CM | POA: Diagnosis not present

## 2018-12-10 DIAGNOSIS — Z87891 Personal history of nicotine dependence: Secondary | ICD-10-CM | POA: Diagnosis not present

## 2018-12-10 DIAGNOSIS — I70223 Atherosclerosis of native arteries of extremities with rest pain, bilateral legs: Secondary | ICD-10-CM | POA: Diagnosis not present

## 2018-12-12 ENCOUNTER — Telehealth (INDEPENDENT_AMBULATORY_CARE_PROVIDER_SITE_OTHER): Payer: Self-pay | Admitting: Vascular Surgery

## 2018-12-12 NOTE — Telephone Encounter (Signed)
Abby with [facility] called and said that the patient had a horrible odor coming from the wound vac. She states that you can smell it before even taking the vac off. She said wound itself looks fine, drainage is normal, no other issues. Just the smell.  I spoke with Mickel Baas, she said that if the wound was looking healthy and the drainage was normal than the smell was likely the black foam that is put on the wound after surgery.   I called and spoke with Abby, she again stated that the wound looked good and drainage is normal. I advised her that the smell is likely the foam on the wound. Advised her to keep and eye on it and if she noticed any changes for her to notify us. She verbalized understanding. AS, CMA

## 2018-12-15 DIAGNOSIS — I509 Heart failure, unspecified: Secondary | ICD-10-CM | POA: Diagnosis not present

## 2018-12-15 DIAGNOSIS — I110 Hypertensive heart disease with heart failure: Secondary | ICD-10-CM | POA: Diagnosis not present

## 2018-12-15 DIAGNOSIS — Z7982 Long term (current) use of aspirin: Secondary | ICD-10-CM | POA: Diagnosis not present

## 2018-12-15 DIAGNOSIS — T8141XA Infection following a procedure, superficial incisional surgical site, initial encounter: Secondary | ICD-10-CM | POA: Diagnosis not present

## 2018-12-15 DIAGNOSIS — I70223 Atherosclerosis of native arteries of extremities with rest pain, bilateral legs: Secondary | ICD-10-CM | POA: Diagnosis not present

## 2018-12-15 DIAGNOSIS — Z7902 Long term (current) use of antithrombotics/antiplatelets: Secondary | ICD-10-CM | POA: Diagnosis not present

## 2018-12-15 DIAGNOSIS — E782 Mixed hyperlipidemia: Secondary | ICD-10-CM | POA: Diagnosis not present

## 2018-12-15 DIAGNOSIS — E1151 Type 2 diabetes mellitus with diabetic peripheral angiopathy without gangrene: Secondary | ICD-10-CM | POA: Diagnosis not present

## 2018-12-15 DIAGNOSIS — Z87891 Personal history of nicotine dependence: Secondary | ICD-10-CM | POA: Diagnosis not present

## 2018-12-15 DIAGNOSIS — Z6834 Body mass index (BMI) 34.0-34.9, adult: Secondary | ICD-10-CM | POA: Diagnosis not present

## 2018-12-15 DIAGNOSIS — E669 Obesity, unspecified: Secondary | ICD-10-CM | POA: Diagnosis not present

## 2018-12-15 DIAGNOSIS — K5790 Diverticulosis of intestine, part unspecified, without perforation or abscess without bleeding: Secondary | ICD-10-CM | POA: Diagnosis not present

## 2018-12-15 DIAGNOSIS — I2510 Atherosclerotic heart disease of native coronary artery without angina pectoris: Secondary | ICD-10-CM | POA: Diagnosis not present

## 2018-12-15 DIAGNOSIS — Z96611 Presence of right artificial shoulder joint: Secondary | ICD-10-CM | POA: Diagnosis not present

## 2018-12-15 DIAGNOSIS — Z955 Presence of coronary angioplasty implant and graft: Secondary | ICD-10-CM | POA: Diagnosis not present

## 2018-12-18 DIAGNOSIS — I70223 Atherosclerosis of native arteries of extremities with rest pain, bilateral legs: Secondary | ICD-10-CM | POA: Diagnosis not present

## 2018-12-18 DIAGNOSIS — I509 Heart failure, unspecified: Secondary | ICD-10-CM | POA: Diagnosis not present

## 2018-12-18 DIAGNOSIS — E782 Mixed hyperlipidemia: Secondary | ICD-10-CM | POA: Diagnosis not present

## 2018-12-18 DIAGNOSIS — E1151 Type 2 diabetes mellitus with diabetic peripheral angiopathy without gangrene: Secondary | ICD-10-CM | POA: Diagnosis not present

## 2018-12-18 DIAGNOSIS — T8141XA Infection following a procedure, superficial incisional surgical site, initial encounter: Secondary | ICD-10-CM | POA: Diagnosis not present

## 2018-12-18 DIAGNOSIS — K5790 Diverticulosis of intestine, part unspecified, without perforation or abscess without bleeding: Secondary | ICD-10-CM | POA: Diagnosis not present

## 2018-12-18 DIAGNOSIS — I110 Hypertensive heart disease with heart failure: Secondary | ICD-10-CM | POA: Diagnosis not present

## 2018-12-18 DIAGNOSIS — E669 Obesity, unspecified: Secondary | ICD-10-CM | POA: Diagnosis not present

## 2018-12-23 ENCOUNTER — Encounter (INDEPENDENT_AMBULATORY_CARE_PROVIDER_SITE_OTHER): Payer: Self-pay

## 2018-12-25 ENCOUNTER — Ambulatory Visit (INDEPENDENT_AMBULATORY_CARE_PROVIDER_SITE_OTHER): Payer: PPO | Admitting: Vascular Surgery

## 2018-12-25 ENCOUNTER — Other Ambulatory Visit (INDEPENDENT_AMBULATORY_CARE_PROVIDER_SITE_OTHER): Payer: Self-pay | Admitting: Vascular Surgery

## 2018-12-25 ENCOUNTER — Telehealth (INDEPENDENT_AMBULATORY_CARE_PROVIDER_SITE_OTHER): Payer: Self-pay | Admitting: Vascular Surgery

## 2018-12-25 ENCOUNTER — Telehealth (INDEPENDENT_AMBULATORY_CARE_PROVIDER_SITE_OTHER): Payer: Self-pay

## 2018-12-25 ENCOUNTER — Encounter (INDEPENDENT_AMBULATORY_CARE_PROVIDER_SITE_OTHER): Payer: Self-pay | Admitting: Vascular Surgery

## 2018-12-25 ENCOUNTER — Other Ambulatory Visit: Payer: Self-pay

## 2018-12-25 ENCOUNTER — Ambulatory Visit (INDEPENDENT_AMBULATORY_CARE_PROVIDER_SITE_OTHER): Payer: PPO

## 2018-12-25 VITALS — BP 118/67 | HR 87 | Resp 16 | Ht 59.0 in | Wt 173.6 lb

## 2018-12-25 DIAGNOSIS — I739 Peripheral vascular disease, unspecified: Secondary | ICD-10-CM | POA: Diagnosis not present

## 2018-12-25 DIAGNOSIS — I70223 Atherosclerosis of native arteries of extremities with rest pain, bilateral legs: Secondary | ICD-10-CM

## 2018-12-25 NOTE — Telephone Encounter (Signed)
I called to inform patient home health nurse Abby with new orders to stop wound vac and start aquacel ag every other dayt until completely healed

## 2018-12-25 NOTE — Telephone Encounter (Signed)
That is fine.  Please call back to give verbal order, let me know if they need written

## 2018-12-25 NOTE — Telephone Encounter (Signed)
Abby with [facility] is calling requesting additional order for 3 visits per week for 4 more weeks for her wound vac care. Please advise. As, CMA

## 2018-12-26 ENCOUNTER — Encounter (INDEPENDENT_AMBULATORY_CARE_PROVIDER_SITE_OTHER): Payer: Self-pay | Admitting: Vascular Surgery

## 2018-12-26 NOTE — Progress Notes (Signed)
Patient ID: Christina Blackburn, female   DOB: 01-07-1946, 73 y.o.   MRN: [id number]  Chief Complaint  Patient presents with  . Follow-up    [facility] 2weeks abi    HPI Christina Blackburn is a 73 y.o. female.   Denies pain in her feet  Her rest pain is relieved.  No fever or chills  ABI are unchanged from preop  Past Medical History:  Diagnosis Date  . Anemia   . CHF (congestive heart failure) ([place])   . Chicken pox   . Colon polyps   . Complication of anesthesia   . Coronary artery disease   . Cystitis 1986  . Diabetes mellitus without complication ([place]) 4696  . Diverticulosis   . Heart disease 2008  . Hyperlipidemia   . Hypertension 2003  . PAD (peripheral artery disease) ([place])   . PONV (postoperative nausea and vomiting)   . Rotator cuff tendinitis   . Squamous cell skin cancer 2016    Past Surgical History:  Procedure Laterality Date  . ABDOMINAL HYSTERECTOMY  1989  . APPLICATION OF WOUND VAC Bilateral 12/02/2018   Procedure: APPLICATION OF WOUND VAC;  Surgeon: Katha Cabal, MD;  Location: [facility] ORS;  Service: Vascular;  Laterality: Bilateral;  . BREAST BIOPSY Left 2014   neg  . CARDIAC CATHETERIZATION     cornary stent  . [place]  . CHOLECYSTECTOMY  1987  . COLONOSCOPY    . COLONOSCOPY WITH PROPOFOL N/A 09/22/2018   Procedure: COLONOSCOPY WITH PROPOFOL;  Surgeon: Lollie Sails, MD;  Location: [facility] ENDOSCOPY;  Service: Endoscopy;  Laterality: N/A;  . CORONARY ANGIOPLASTY  2008   stent x1  . ENDARTERECTOMY FEMORAL Bilateral 11/12/2018   Procedure: ENDARTERECTOMY FEMORAL;  Surgeon: Katha Cabal, MD;  Location: [facility] ORS;  Service: Vascular;  Laterality: Bilateral;  . INSERTION OF ILIAC STENT Bilateral 11/12/2018   Procedure: INSERTION OF ILIAC STENT;  Surgeon: Katha Cabal, MD;  Location: [facility] ORS;  Service: Vascular;  Laterality: Bilateral;  . JOINT REPLACEMENT Right 04/12/2018   shoulder  . LOWER EXTREMITY ANGIOGRAPHY  Left 08/05/2018   Procedure: LOWER EXTREMITY ANGIOGRAPHY;  Surgeon: Katha Cabal, MD;  Location: [facility];  Service: Cardiovascular;  Laterality: Left;  . REVERSE SHOULDER ARTHROPLASTY Right 04/08/2018   Procedure: REVERSE SHOULDER ARTHROPLASTY;  Surgeon: Corky Mull, MD;  Location: [facility] ORS;  Service: Orthopedics;  Laterality: Right;  . TUBAL LIGATION    . WOUND DEBRIDEMENT Bilateral 12/02/2018   Procedure: DEBRIDEMENT WOUND;  Surgeon: Katha Cabal, MD;  Location: [facility] ORS;  Service: Vascular;  Laterality: Bilateral;      Allergies  Allergen Reactions  . Tape Other (See Comments)    Hypoallergenic tape - blisters    Current Outpatient Medications  Medication Sig Dispense Refill  . amLODipine (NORVASC) 5 MG tablet Take 5 mg by mouth daily.    Marland Kitchen aspirin 81 MG tablet Take 81 mg by mouth daily.    Marland Kitchen atorvastatin (LIPITOR) 20 MG tablet Take 20 mg by mouth daily.     . calcium carbonate (OS-CAL) 600 MG TABS tablet Take 600 mg by mouth 2 (two) times daily with a meal.    . cephALEXin (KEFLEX) 500 MG capsule Take 1 capsule (500 mg total) by mouth 2 (two) times daily. 24 capsule 0  . Cholecalciferol (VITAMIN D3 PO) Take 400 Units by mouth daily.     . clopidogrel (PLAVIX) 75 MG tablet Take 75 mg  by mouth daily.    . cyclobenzaprine (FLEXERIL) 10 MG tablet Take 10 mg by mouth 3 (three) times daily as needed for muscle spasms.    . fenofibrate (TRICOR) 145 MG tablet Take 145 mg by mouth daily.    . fish oil-omega-3 fatty acids 1000 MG capsule Take 1 g by mouth daily.    . furosemide (LASIX) 20 MG tablet Take 20 mg by mouth as needed.    Marland Kitchen glimepiride (AMARYL) 4 MG tablet Take 4 mg by mouth daily with breakfast.     . losartan-hydrochlorothiazide (HYZAAR) 100-25 MG tablet Take 1 tablet by mouth daily.     . magnesium oxide (MAG-OX) 400 MG tablet Take 400 mg by mouth daily.    . metFORMIN (GLUCOPHAGE) 1000 MG tablet Take 1,000 mg by mouth daily with breakfast.     .  metoprolol succinate (TOPROL-XL) 100 MG 24 hr tablet Take 100 mg by mouth daily. Take with or immediately following a meal.    . OVER THE COUNTER MEDICATION Take 2 tablets by mouth at bedtime. Mag R&R Supplement    . silver sulfADIAZINE (SILVADENE) 1 % cream Apply 1 application topically daily. 50 g 0  . traZODone (DESYREL) 50 MG tablet Take 50 mg by mouth at bedtime.    . TURMERIC PO Take by mouth daily.    . vitamin B-12 (CYANOCOBALAMIN) 1000 MCG tablet Take 1,000 mcg by mouth daily.    . vitamin E 400 UNIT capsule Take 400 Units by mouth daily.     Marland Kitchen oxyCODONE-acetaminophen (PERCOCET/ROXICET) 5-325 MG tablet Take 1 tablet by mouth every 6 (six) hours as needed for moderate pain or severe pain. (Patient not taking: Reported on 12/25/2018) 28 tablet 0   No current facility-administered medications for this visit.         Physical Exam BP 118/67 (BP Location: Right Arm)   Pulse 87   Resp 16   Ht 4\' 11"  (1.499 m)   Wt 173 lb 9.6 oz (78.7 kg)   BMI 35.06 kg/m  Gen:  WD/WN, NAD Skin: incision  Now very superficial and well granulated    Assessment/Plan: 1. Atherosclerosis of native artery of both lower extremities with rest pain ([place]) Wounds much better DC VAC and change to Aquacel Ag every other day        Christina Blackburn 12/26/2018, 7:56 AM   This note was created with Dragon medical transcription system.  Any errors from dictation are unintentional.

## 2018-12-30 DIAGNOSIS — T8149XA Infection following a procedure, other surgical site, initial encounter: Secondary | ICD-10-CM | POA: Diagnosis not present

## 2019-01-05 DIAGNOSIS — Z8679 Personal history of other diseases of the circulatory system: Secondary | ICD-10-CM | POA: Diagnosis not present

## 2019-01-05 DIAGNOSIS — Z9889 Other specified postprocedural states: Secondary | ICD-10-CM | POA: Diagnosis not present

## 2019-01-05 DIAGNOSIS — E7800 Pure hypercholesterolemia, unspecified: Secondary | ICD-10-CM | POA: Diagnosis not present

## 2019-01-05 DIAGNOSIS — I10 Essential (primary) hypertension: Secondary | ICD-10-CM | POA: Diagnosis not present

## 2019-01-05 DIAGNOSIS — I2510 Atherosclerotic heart disease of native coronary artery without angina pectoris: Secondary | ICD-10-CM | POA: Diagnosis not present

## 2019-01-08 ENCOUNTER — Encounter (INDEPENDENT_AMBULATORY_CARE_PROVIDER_SITE_OTHER): Payer: Self-pay | Admitting: Vascular Surgery

## 2019-01-08 ENCOUNTER — Ambulatory Visit (INDEPENDENT_AMBULATORY_CARE_PROVIDER_SITE_OTHER): Payer: PPO | Admitting: Vascular Surgery

## 2019-01-08 ENCOUNTER — Other Ambulatory Visit: Payer: Self-pay

## 2019-01-08 VITALS — BP 150/75 | HR 80 | Resp 16 | Ht 59.0 in | Wt 174.6 lb

## 2019-01-08 DIAGNOSIS — I70229 Atherosclerosis of native arteries of extremities with rest pain, unspecified extremity: Secondary | ICD-10-CM

## 2019-01-08 NOTE — Progress Notes (Signed)
Patient ID: Shannia Jacuinde, female   DOB: 11-Jan-1946, 73 y.o.   MRN: [id number]  Chief Complaint  Patient presents with  . Follow-up    2week follow up    HPI Brittania Hermine Feria is a 73 y.o. female.    The patient states visiting nurses been going well.  She denies pain both at the groin incision sites as well as in both feet.  She feels she has been doing quite well.   Past Medical History:  Diagnosis Date  . Anemia   . CHF (congestive heart failure) ([place])   . Chicken pox   . Colon polyps   . Complication of anesthesia   . Coronary artery disease   . Cystitis 1986  . Diabetes mellitus without complication ([place]) 4098  . Diverticulosis   . Heart disease 2008  . Hyperlipidemia   . Hypertension 2003  . PAD (peripheral artery disease) ([place])   . PONV (postoperative nausea and vomiting)   . Rotator cuff tendinitis   . Squamous cell skin cancer 2016    Past Surgical History:  Procedure Laterality Date  . ABDOMINAL HYSTERECTOMY  1989  . APPLICATION OF WOUND VAC Bilateral 12/02/2018   Procedure: APPLICATION OF WOUND VAC;  Surgeon: Katha Cabal, MD;  Location: [facility] ORS;  Service: Vascular;  Laterality: Bilateral;  . BREAST BIOPSY Left 2014   neg  . CARDIAC CATHETERIZATION     cornary stent  . [place]  . CHOLECYSTECTOMY  1987  . COLONOSCOPY    . COLONOSCOPY WITH PROPOFOL N/A 09/22/2018   Procedure: COLONOSCOPY WITH PROPOFOL;  Surgeon: Lollie Sails, MD;  Location: [facility] ENDOSCOPY;  Service: Endoscopy;  Laterality: N/A;  . CORONARY ANGIOPLASTY  2008   stent x1  . ENDARTERECTOMY FEMORAL Bilateral 11/12/2018   Procedure: ENDARTERECTOMY FEMORAL;  Surgeon: Katha Cabal, MD;  Location: [facility] ORS;  Service: Vascular;  Laterality: Bilateral;  . INSERTION OF ILIAC STENT Bilateral 11/12/2018   Procedure: INSERTION OF ILIAC STENT;  Surgeon: Katha Cabal, MD;  Location: [facility] ORS;  Service: Vascular;  Laterality: Bilateral;  . JOINT  REPLACEMENT Right 04/12/2018   shoulder  . LOWER EXTREMITY ANGIOGRAPHY Left 08/05/2018   Procedure: LOWER EXTREMITY ANGIOGRAPHY;  Surgeon: Katha Cabal, MD;  Location: [place] [facility];  Service: Cardiovascular;  Laterality: Left;  . REVERSE SHOULDER ARTHROPLASTY Right 04/08/2018   Procedure: REVERSE SHOULDER ARTHROPLASTY;  Surgeon: Corky Mull, MD;  Location: [facility] ORS;  Service: Orthopedics;  Laterality: Right;  . TUBAL LIGATION    . WOUND DEBRIDEMENT Bilateral 12/02/2018   Procedure: DEBRIDEMENT WOUND;  Surgeon: Katha Cabal, MD;  Location: [facility] ORS;  Service: Vascular;  Laterality: Bilateral;      Allergies  Allergen Reactions  . Tape Other (See Comments)    Hypoallergenic tape - blisters    Current Outpatient Medications  Medication Sig Dispense Refill  . amLODipine (NORVASC) 5 MG tablet Take 5 mg by mouth daily.    Marland Kitchen aspirin 81 MG tablet Take 81 mg by mouth daily.    Marland Kitchen atorvastatin (LIPITOR) 20 MG tablet Take 20 mg by mouth daily.     . calcium carbonate (OS-CAL) 600 MG TABS tablet Take 600 mg by mouth 2 (two) times daily with a meal.    . cephALEXin (KEFLEX) 500 MG capsule Take 1 capsule (500 mg total) by mouth 2 (two) times daily. 24 capsule 0  . Cholecalciferol (VITAMIN D3 PO) Take 400 Units by mouth daily.     Marland Kitchen  clopidogrel (PLAVIX) 75 MG tablet Take 75 mg by mouth daily.    . cyclobenzaprine (FLEXERIL) 10 MG tablet Take 10 mg by mouth 3 (three) times daily as needed for muscle spasms.    . fenofibrate (TRICOR) 145 MG tablet Take 145 mg by mouth daily.    . fish oil-omega-3 fatty acids 1000 MG capsule Take 1 g by mouth daily.    . furosemide (LASIX) 20 MG tablet Take 20 mg by mouth as needed.    Marland Kitchen glimepiride (AMARYL) 4 MG tablet Take 4 mg by mouth daily with breakfast.     . losartan-hydrochlorothiazide (HYZAAR) 100-25 MG tablet Take 1 tablet by mouth daily.     . magnesium oxide (MAG-OX) 400 MG tablet Take 400 mg by mouth daily.    . metFORMIN  (GLUCOPHAGE) 1000 MG tablet Take 1,000 mg by mouth daily with breakfast.     . metoprolol succinate (TOPROL-XL) 100 MG 24 hr tablet Take 100 mg by mouth daily. Take with or immediately following a meal.    . OVER THE COUNTER MEDICATION Take 2 tablets by mouth at bedtime. Mag R&R Supplement    . silver sulfADIAZINE (SILVADENE) 1 % cream Apply 1 application topically daily. 50 g 0  . traZODone (DESYREL) 50 MG tablet Take 50 mg by mouth at bedtime.    . TURMERIC PO Take by mouth daily.    . vitamin B-12 (CYANOCOBALAMIN) 1000 MCG tablet Take 1,000 mcg by mouth daily.    . vitamin E 400 UNIT capsule Take 400 Units by mouth daily.     Marland Kitchen oxyCODONE-acetaminophen (PERCOCET/ROXICET) 5-325 MG tablet Take 1 tablet by mouth every 6 (six) hours as needed for moderate pain or severe pain. (Patient not taking: Reported on 12/25/2018) 28 tablet 0   No current facility-administered medications for this visit.         Physical Exam BP (!) 150/75 (BP Location: Right Arm)   Pulse 80   Resp 16   Ht 4\' 11"  (1.499 m)   Wt 174 lb 9.6 oz (79.2 kg)   BMI 35.26 kg/m  Gen:  WD/WN, NAD Skin: incision are clean and dry on the right there is a 1 cm by half centimeter segment of proud flesh and this was treated with silver nitrate on the left there are 2 small areas that have yet to completely epithelialized.  There is no erythema no induration no evidence of infection.     Assessment/Plan:  1. Atherosclerosis of artery of extremity with rest pain [facility]) Patient is doing well and she will follow-up with me in 6 weeks.  We will continue Aquacel dressing changes.  I will obtain arterial duplexes of both lower extremities with the visit after the next 1.      [name] 01/08/2019, 4:24 PM   This note was created with Dragon medical transcription system.  Any errors from dictation are unintentional.

## 2019-01-09 DIAGNOSIS — E1151 Type 2 diabetes mellitus with diabetic peripheral angiopathy without gangrene: Secondary | ICD-10-CM | POA: Diagnosis not present

## 2019-01-09 DIAGNOSIS — I70223 Atherosclerosis of native arteries of extremities with rest pain, bilateral legs: Secondary | ICD-10-CM | POA: Diagnosis not present

## 2019-01-09 DIAGNOSIS — I2510 Atherosclerotic heart disease of native coronary artery without angina pectoris: Secondary | ICD-10-CM | POA: Diagnosis not present

## 2019-01-09 DIAGNOSIS — I509 Heart failure, unspecified: Secondary | ICD-10-CM | POA: Diagnosis not present

## 2019-01-09 DIAGNOSIS — T8141XA Infection following a procedure, superficial incisional surgical site, initial encounter: Secondary | ICD-10-CM | POA: Diagnosis not present

## 2019-01-09 DIAGNOSIS — Z955 Presence of coronary angioplasty implant and graft: Secondary | ICD-10-CM | POA: Diagnosis not present

## 2019-01-09 DIAGNOSIS — Z87891 Personal history of nicotine dependence: Secondary | ICD-10-CM | POA: Diagnosis not present

## 2019-01-09 DIAGNOSIS — Z7982 Long term (current) use of aspirin: Secondary | ICD-10-CM | POA: Diagnosis not present

## 2019-01-09 DIAGNOSIS — E782 Mixed hyperlipidemia: Secondary | ICD-10-CM | POA: Diagnosis not present

## 2019-01-09 DIAGNOSIS — Z6834 Body mass index (BMI) 34.0-34.9, adult: Secondary | ICD-10-CM | POA: Diagnosis not present

## 2019-01-09 DIAGNOSIS — E669 Obesity, unspecified: Secondary | ICD-10-CM | POA: Diagnosis not present

## 2019-01-09 DIAGNOSIS — I110 Hypertensive heart disease with heart failure: Secondary | ICD-10-CM | POA: Diagnosis not present

## 2019-01-09 DIAGNOSIS — Z7902 Long term (current) use of antithrombotics/antiplatelets: Secondary | ICD-10-CM | POA: Diagnosis not present

## 2019-01-09 DIAGNOSIS — Z96611 Presence of right artificial shoulder joint: Secondary | ICD-10-CM | POA: Diagnosis not present

## 2019-01-09 DIAGNOSIS — K5790 Diverticulosis of intestine, part unspecified, without perforation or abscess without bleeding: Secondary | ICD-10-CM | POA: Diagnosis not present

## 2019-01-29 DIAGNOSIS — M5136 Other intervertebral disc degeneration, lumbar region: Secondary | ICD-10-CM | POA: Diagnosis not present

## 2019-01-29 DIAGNOSIS — M9903 Segmental and somatic dysfunction of lumbar region: Secondary | ICD-10-CM | POA: Diagnosis not present

## 2019-01-29 DIAGNOSIS — M546 Pain in thoracic spine: Secondary | ICD-10-CM | POA: Diagnosis not present

## 2019-01-29 DIAGNOSIS — M9902 Segmental and somatic dysfunction of thoracic region: Secondary | ICD-10-CM | POA: Diagnosis not present

## 2019-01-29 DIAGNOSIS — M9901 Segmental and somatic dysfunction of cervical region: Secondary | ICD-10-CM | POA: Diagnosis not present

## 2019-01-29 DIAGNOSIS — M531 Cervicobrachial syndrome: Secondary | ICD-10-CM | POA: Diagnosis not present

## 2019-02-02 DIAGNOSIS — M9903 Segmental and somatic dysfunction of lumbar region: Secondary | ICD-10-CM | POA: Diagnosis not present

## 2019-02-02 DIAGNOSIS — M5136 Other intervertebral disc degeneration, lumbar region: Secondary | ICD-10-CM | POA: Diagnosis not present

## 2019-02-02 DIAGNOSIS — M531 Cervicobrachial syndrome: Secondary | ICD-10-CM | POA: Diagnosis not present

## 2019-02-02 DIAGNOSIS — M9901 Segmental and somatic dysfunction of cervical region: Secondary | ICD-10-CM | POA: Diagnosis not present

## 2019-02-02 DIAGNOSIS — M9902 Segmental and somatic dysfunction of thoracic region: Secondary | ICD-10-CM | POA: Diagnosis not present

## 2019-02-02 DIAGNOSIS — M546 Pain in thoracic spine: Secondary | ICD-10-CM | POA: Diagnosis not present

## 2019-02-04 DIAGNOSIS — M5136 Other intervertebral disc degeneration, lumbar region: Secondary | ICD-10-CM | POA: Diagnosis not present

## 2019-02-04 DIAGNOSIS — M531 Cervicobrachial syndrome: Secondary | ICD-10-CM | POA: Diagnosis not present

## 2019-02-04 DIAGNOSIS — M9902 Segmental and somatic dysfunction of thoracic region: Secondary | ICD-10-CM | POA: Diagnosis not present

## 2019-02-04 DIAGNOSIS — M9903 Segmental and somatic dysfunction of lumbar region: Secondary | ICD-10-CM | POA: Diagnosis not present

## 2019-02-04 DIAGNOSIS — M9901 Segmental and somatic dysfunction of cervical region: Secondary | ICD-10-CM | POA: Diagnosis not present

## 2019-02-04 DIAGNOSIS — M546 Pain in thoracic spine: Secondary | ICD-10-CM | POA: Diagnosis not present

## 2019-02-09 DIAGNOSIS — M546 Pain in thoracic spine: Secondary | ICD-10-CM | POA: Diagnosis not present

## 2019-02-09 DIAGNOSIS — M9903 Segmental and somatic dysfunction of lumbar region: Secondary | ICD-10-CM | POA: Diagnosis not present

## 2019-02-09 DIAGNOSIS — M9902 Segmental and somatic dysfunction of thoracic region: Secondary | ICD-10-CM | POA: Diagnosis not present

## 2019-02-09 DIAGNOSIS — M9901 Segmental and somatic dysfunction of cervical region: Secondary | ICD-10-CM | POA: Diagnosis not present

## 2019-02-09 DIAGNOSIS — M531 Cervicobrachial syndrome: Secondary | ICD-10-CM | POA: Diagnosis not present

## 2019-02-09 DIAGNOSIS — M5136 Other intervertebral disc degeneration, lumbar region: Secondary | ICD-10-CM | POA: Diagnosis not present

## 2019-02-12 DIAGNOSIS — E7800 Pure hypercholesterolemia, unspecified: Secondary | ICD-10-CM | POA: Diagnosis not present

## 2019-02-12 DIAGNOSIS — I2510 Atherosclerotic heart disease of native coronary artery without angina pectoris: Secondary | ICD-10-CM | POA: Diagnosis not present

## 2019-02-12 DIAGNOSIS — I10 Essential (primary) hypertension: Secondary | ICD-10-CM | POA: Diagnosis not present

## 2019-02-12 DIAGNOSIS — Z79899 Other long term (current) drug therapy: Secondary | ICD-10-CM | POA: Diagnosis not present

## 2019-02-12 DIAGNOSIS — Z0000 Encounter for general adult medical examination without abnormal findings: Secondary | ICD-10-CM | POA: Diagnosis not present

## 2019-02-12 DIAGNOSIS — E1165 Type 2 diabetes mellitus with hyperglycemia: Secondary | ICD-10-CM | POA: Diagnosis not present

## 2019-02-16 DIAGNOSIS — M546 Pain in thoracic spine: Secondary | ICD-10-CM | POA: Diagnosis not present

## 2019-02-16 DIAGNOSIS — M9901 Segmental and somatic dysfunction of cervical region: Secondary | ICD-10-CM | POA: Diagnosis not present

## 2019-02-16 DIAGNOSIS — M5136 Other intervertebral disc degeneration, lumbar region: Secondary | ICD-10-CM | POA: Diagnosis not present

## 2019-02-16 DIAGNOSIS — M531 Cervicobrachial syndrome: Secondary | ICD-10-CM | POA: Diagnosis not present

## 2019-02-16 DIAGNOSIS — M9902 Segmental and somatic dysfunction of thoracic region: Secondary | ICD-10-CM | POA: Diagnosis not present

## 2019-02-16 DIAGNOSIS — M9903 Segmental and somatic dysfunction of lumbar region: Secondary | ICD-10-CM | POA: Diagnosis not present

## 2019-02-19 ENCOUNTER — Other Ambulatory Visit: Payer: Self-pay

## 2019-02-19 ENCOUNTER — Ambulatory Visit (INDEPENDENT_AMBULATORY_CARE_PROVIDER_SITE_OTHER): Payer: PPO | Admitting: Nurse Practitioner

## 2019-02-19 ENCOUNTER — Encounter (INDEPENDENT_AMBULATORY_CARE_PROVIDER_SITE_OTHER): Payer: Self-pay | Admitting: Nurse Practitioner

## 2019-02-19 VITALS — BP 138/61 | HR 80 | Resp 10 | Ht 59.5 in | Wt 178.0 lb

## 2019-02-19 DIAGNOSIS — Z7984 Long term (current) use of oral hypoglycemic drugs: Secondary | ICD-10-CM | POA: Diagnosis not present

## 2019-02-19 DIAGNOSIS — E1151 Type 2 diabetes mellitus with diabetic peripheral angiopathy without gangrene: Secondary | ICD-10-CM

## 2019-02-19 DIAGNOSIS — I70223 Atherosclerosis of native arteries of extremities with rest pain, bilateral legs: Secondary | ICD-10-CM | POA: Diagnosis not present

## 2019-02-19 DIAGNOSIS — I10 Essential (primary) hypertension: Secondary | ICD-10-CM

## 2019-02-19 DIAGNOSIS — Z87891 Personal history of nicotine dependence: Secondary | ICD-10-CM | POA: Diagnosis not present

## 2019-02-19 DIAGNOSIS — E782 Mixed hyperlipidemia: Secondary | ICD-10-CM

## 2019-02-19 DIAGNOSIS — Z79899 Other long term (current) drug therapy: Secondary | ICD-10-CM | POA: Diagnosis not present

## 2019-02-19 MED ORDER — NYSTATIN 100000 UNIT/GM EX CREA: 1.0000 "application " | TOPICAL_CREAM | Freq: Two times a day (BID) | CUTANEOUS | 0 refills | Status: DC

## 2019-02-23 ENCOUNTER — Other Ambulatory Visit (INDEPENDENT_AMBULATORY_CARE_PROVIDER_SITE_OTHER): Payer: Self-pay | Admitting: Nurse Practitioner

## 2019-02-23 ENCOUNTER — Encounter (INDEPENDENT_AMBULATORY_CARE_PROVIDER_SITE_OTHER): Payer: Self-pay | Admitting: Nurse Practitioner

## 2019-02-23 NOTE — Progress Notes (Signed)
SUBJECTIVE:  Patient ID: Christina Blackburn, female    DOB: 27-Dec-1945, 73 y.o.   MRN: [id number] Chief Complaint  Patient presents with  . Follow-up    HPI  Christina Blackburn is a 73 y.o. female that presents for wound check from her bilateral femoral endarterectomy.  Initially the wounds were dehisced it is oozing.  The patient underwent femoral endarterectomy on 11/12/2018 with insertion of bilateral iliac stents.  On 12/02/2018 she had bilateral wound debridements with wound VAC application.  Today the wound appears very nearly healed with just a tiny pinpoint opening on her left groin.  Very small amount of drainage.  The patient states that she continues to have claudication-like symptoms as well.  She states that doing things such as working in her garden and walking short distances are difficult for her.  She denies any rest pain.  She denies any chest pain or shortness of breath.  Past Medical History:  Diagnosis Date  . Anemia   . CHF (congestive heart failure) ([place])   . Chicken pox   . Colon polyps   . Complication of anesthesia   . Coronary artery disease   . Cystitis 1986  . Diabetes mellitus without complication ([place]) 3086  . Diverticulosis   . Heart disease 2008  . Hyperlipidemia   . Hypertension 2003  . PAD (peripheral artery disease) ([place])   . PONV (postoperative nausea and vomiting)   . Rotator cuff tendinitis   . Squamous cell skin cancer 2016    Past Surgical History:  Procedure Laterality Date  . ABDOMINAL HYSTERECTOMY  1989  . APPLICATION OF WOUND VAC Bilateral 12/02/2018   Procedure: APPLICATION OF WOUND VAC;  Surgeon: Katha Cabal, MD;  Location: [facility] ORS;  Service: Vascular;  Laterality: Bilateral;  . BREAST BIOPSY Left 2014   neg  . CARDIAC CATHETERIZATION     cornary stent  . [place]  . CHOLECYSTECTOMY  1987  . COLONOSCOPY    . COLONOSCOPY WITH PROPOFOL N/A 09/22/2018   Procedure: COLONOSCOPY WITH PROPOFOL;   Surgeon: Lollie Sails, MD;  Location: [facility] ENDOSCOPY;  Service: Endoscopy;  Laterality: N/A;  . CORONARY ANGIOPLASTY  2008   stent x1  . ENDARTERECTOMY FEMORAL Bilateral 11/12/2018   Procedure: ENDARTERECTOMY FEMORAL;  Surgeon: Katha Cabal, MD;  Location: [facility] ORS;  Service: Vascular;  Laterality: Bilateral;  . INSERTION OF ILIAC STENT Bilateral 11/12/2018   Procedure: INSERTION OF ILIAC STENT;  Surgeon: Katha Cabal, MD;  Location: [facility] ORS;  Service: Vascular;  Laterality: Bilateral;  . JOINT REPLACEMENT Right 04/12/2018   shoulder  . LOWER EXTREMITY ANGIOGRAPHY Left 08/05/2018   Procedure: LOWER EXTREMITY ANGIOGRAPHY;  Surgeon: Katha Cabal, MD;  Location: [place] CV LAB;  Service: Cardiovascular;  Laterality: Left;  . REVERSE SHOULDER ARTHROPLASTY Right 04/08/2018   Procedure: REVERSE SHOULDER ARTHROPLASTY;  Surgeon: Corky Mull, MD;  Location: [facility] ORS;  Service: Orthopedics;  Laterality: Right;  . TUBAL LIGATION    . WOUND DEBRIDEMENT Bilateral 12/02/2018   Procedure: DEBRIDEMENT WOUND;  Surgeon: Katha Cabal, MD;  Location: [facility] ORS;  Service: Vascular;  Laterality: Bilateral;    Social History   Socioeconomic History  . Marital status: Widowed    Spouse name: Not on file  . Number of children: Not on file  . Years of education: Not on file  . Highest education level: Not on file  Occupational History  . Occupation: Retired    Comment: x-ray  tech.   Social Needs  . Financial resource strain: Not very hard  . Food insecurity:    Worry: Not on file    Inability: Not on file  . Transportation needs:    Medical: No    Non-medical: No  Tobacco Use  . Smoking status: Former Smoker    Years: 6.00    Types: Cigarettes    Last attempt to quit: 2008    Years since quitting: 12.3  . Smokeless tobacco: Never Used  Substance and Sexual Activity  . Alcohol use: Yes    Comment: rare  . Drug use: No  . Sexual activity: Not on file  Lifestyle   . Physical activity:    Days per week: 2 days    Minutes per session: Not on file  . Stress: Only a little  Relationships  . Social connections:    Talks on phone: More than three times a week    Gets together: Not on file    Attends religious service: Never    Active member of club or organization: No    Attends meetings of clubs or organizations: Never    Relationship status: Widowed  . Intimate partner violence:    Fear of current or ex partner: No    Emotionally abused: No    Physically abused: No    Forced sexual activity: No  Other Topics Concern  . Not on file  Social History Narrative  . Not on file    Family History  Problem Relation Age of Onset  . Colon cancer Cousin 40  . Lymphoma Brother 75  . Lung cancer Mother   . Hypertension Father   . Kidney failure Father   . Breast cancer Neg Hx     Allergies  Allergen Reactions  . Tape Other (See Comments)    Hypoallergenic tape - blisters     Review of Systems   Review of Systems: Negative Unless Checked Constitutional: [] Weight loss  [] Fever  [] Chills Cardiac: [] Chest pain   []  Atrial Fibrillation  [] Palpitations   [] Shortness of breath when laying flat   [] Shortness of breath with exertion. [] Shortness of breath at rest Vascular:  [] Pain in legs with walking   [] Pain in legs with standing [] Pain in legs when laying flat   [x] Claudication    [] Pain in feet when laying flat    [] History of DVT   [] Phlebitis   [x] Swelling in legs   [] Varicose veins   [] Non-healing ulcers Pulmonary:   [] Uses home oxygen   [] Productive cough   [] Hemoptysis   [] Wheeze  [] COPD   [] Asthma Neurologic:  [] Dizziness   [] Seizures  [] Blackouts [] History of stroke   [] History of TIA  [] Aphasia   [] Temporary Blindness   [] Weakness or numbness in arm   [] Weakness or numbness in leg Musculoskeletal:   [] Joint swelling   [] Joint pain   [] Low back pain  []  History of Knee Replacement [] Arthritis [] back Surgeries  []  Spinal Stenosis     Hematologic:  [] Easy bruising  [] Easy bleeding   [] Hypercoagulable state   [] Anemic Gastrointestinal:  [] Diarrhea   [] Vomiting  [] Gastroesophageal reflux/heartburn   [] Difficulty swallowing. [] Abdominal pain Genitourinary:  [] Chronic kidney disease   [] Difficult urination  [] Anuric   [] Blood in urine [] Frequent urination  [] Burning with urination   [] Hematuria Skin:  [] Rashes   [] Ulcers [x] Wounds Psychological:  [] History of anxiety   []  History of major depression  []  Memory Difficulties      OBJECTIVE:   Physical Exam  BP 138/61 (BP Location: Left Arm, Patient Position: Sitting, Cuff Size: Small)   Pulse 80   Resp 10   Ht 4' 11.5" (1.511 m)   Wt 178 lb (80.7 kg)   BMI 35.35 kg/m   Gen: WD/WN, NAD Head: [place]/AT, No temporalis wasting.  Ear/Nose/Throat: Hearing grossly intact, nares w/o erythema or drainage Eyes: PER, EOMI, sclera nonicteric.  Neck: Supple, no masses.  No JVD.  Pulmonary:  Good air movement, no use of accessory muscles.  Cardiac: RRR Vascular:  Well-healed groin wounds except for small pinpoint area on left groin distal portion. Vessel Right Left  Radial Palpable Palpable   Gastrointestinal: soft, non-distended. No guarding/no peritoneal signs.  Musculoskeletal: M/S 5/5 throughout.  No deformity or atrophy.  Neurologic: Pain and light touch intact in extremities.  Symmetrical.  Speech is fluent. Motor exam as listed above. Psychiatric: Judgment intact, Mood & affect appropriate for pt's clinical situation. Dermatologic: No Venous rashes. No Ulcers Noted.  No changes consistent with cellulitis. Lymph : No Cervical lymphadenopathy, no lichenification or skin changes of chronic lymphedema.       ASSESSMENT AND PLAN:  1. Atherosclerosis of native artery of both lower extremities with rest pain ([place]) Have the patient return in 4 to 6 weeks to assess wound as well as to do noninvasive studies to determine if further revascularization may be needed. - VAS [place] ABI  WITH/WO TBI; Future - VAS [place] LOWER EXTREMITY ARTERIAL DUPLEX; Future  2. Essential hypertension Continue antihypertensive medications as already ordered, these medications have been reviewed and there are no changes at this time.   3. Type 2 diabetes mellitus with diabetic peripheral angiopathy without gangrene, without long-term current use of insulin (HCC) Continue hypoglycemic medications as already ordered, these medications have been reviewed and there are no changes at this time.  Hgb A1C to be monitored as already arranged by primary service   4. Mixed hyperlipidemia Continue statin as ordered and reviewed, no changes at this time    Current Outpatient Medications on File Prior to Visit  Medication Sig Dispense Refill  . amLODipine (NORVASC) 5 MG tablet Take 5 mg by mouth daily.    Marland Kitchen aspirin 81 MG tablet Take 81 mg by mouth daily.    Marland Kitchen atorvastatin (LIPITOR) 20 MG tablet Take 20 mg by mouth daily.     . calcium carbonate (OS-CAL) 600 MG TABS tablet Take 600 mg by mouth 2 (two) times daily with a meal.    . cephALEXin (KEFLEX) 500 MG capsule Take 1 capsule (500 mg total) by mouth 2 (two) times daily. 24 capsule 0  . Cholecalciferol (VITAMIN D3 PO) Take 400 Units by mouth daily.     . clopidogrel (PLAVIX) 75 MG tablet Take 75 mg by mouth daily.    . cyclobenzaprine (FLEXERIL) 10 MG tablet Take 10 mg by mouth 3 (three) times daily as needed for muscle spasms.    . fenofibrate (TRICOR) 145 MG tablet Take 145 mg by mouth daily.    . fish oil-omega-3 fatty acids 1000 MG capsule Take 1 g by mouth daily.    . furosemide (LASIX) 20 MG tablet Take 20 mg by mouth as needed.    Marland Kitchen glimepiride (AMARYL) 4 MG tablet Take 4 mg by mouth daily with breakfast.     . losartan-hydrochlorothiazide (HYZAAR) 100-25 MG tablet Take 1 tablet by mouth daily.     . magnesium oxide (MAG-OX) 400 MG tablet Take 400 mg by mouth daily.    . metFORMIN (  GLUCOPHAGE) 1000 MG tablet Take 1,000 mg by mouth daily  with breakfast.     . metoprolol succinate (TOPROL-XL) 100 MG 24 hr tablet Take 100 mg by mouth daily. Take with or immediately following a meal.    . OVER THE COUNTER MEDICATION Take 2 tablets by mouth at bedtime. Mag R&R Supplement    . oxyCODONE-acetaminophen (PERCOCET/ROXICET) 5-325 MG tablet Take 1 tablet by mouth every 6 (six) hours as needed for moderate pain or severe pain. 28 tablet 0  . silver sulfADIAZINE (SILVADENE) 1 % cream Apply 1 application topically daily. 50 g 0  . traZODone (DESYREL) 50 MG tablet Take 50 mg by mouth at bedtime.    . TURMERIC PO Take by mouth daily.    . vitamin B-12 (CYANOCOBALAMIN) 1000 MCG tablet Take 1,000 mcg by mouth daily.    . vitamin E 400 UNIT capsule Take 400 Units by mouth daily.      No current facility-administered medications on file prior to visit.     There are no Patient Instructions on file for this visit. No follow-ups on file.   Kris Hartmann, NP  This note was completed with Sales executive.  Any errors are purely unintentional.

## 2019-02-24 DIAGNOSIS — M531 Cervicobrachial syndrome: Secondary | ICD-10-CM | POA: Diagnosis not present

## 2019-02-24 DIAGNOSIS — M546 Pain in thoracic spine: Secondary | ICD-10-CM | POA: Diagnosis not present

## 2019-02-24 DIAGNOSIS — M5136 Other intervertebral disc degeneration, lumbar region: Secondary | ICD-10-CM | POA: Diagnosis not present

## 2019-02-24 DIAGNOSIS — M9902 Segmental and somatic dysfunction of thoracic region: Secondary | ICD-10-CM | POA: Diagnosis not present

## 2019-02-24 DIAGNOSIS — M9903 Segmental and somatic dysfunction of lumbar region: Secondary | ICD-10-CM | POA: Diagnosis not present

## 2019-02-24 DIAGNOSIS — M9901 Segmental and somatic dysfunction of cervical region: Secondary | ICD-10-CM | POA: Diagnosis not present

## 2019-03-18 DIAGNOSIS — M531 Cervicobrachial syndrome: Secondary | ICD-10-CM | POA: Diagnosis not present

## 2019-03-18 DIAGNOSIS — M9903 Segmental and somatic dysfunction of lumbar region: Secondary | ICD-10-CM | POA: Diagnosis not present

## 2019-03-18 DIAGNOSIS — M5136 Other intervertebral disc degeneration, lumbar region: Secondary | ICD-10-CM | POA: Diagnosis not present

## 2019-03-18 DIAGNOSIS — M9902 Segmental and somatic dysfunction of thoracic region: Secondary | ICD-10-CM | POA: Diagnosis not present

## 2019-03-18 DIAGNOSIS — M546 Pain in thoracic spine: Secondary | ICD-10-CM | POA: Diagnosis not present

## 2019-03-18 DIAGNOSIS — M9901 Segmental and somatic dysfunction of cervical region: Secondary | ICD-10-CM | POA: Diagnosis not present

## 2019-03-23 ENCOUNTER — Encounter (INDEPENDENT_AMBULATORY_CARE_PROVIDER_SITE_OTHER): Payer: Self-pay | Admitting: Vascular Surgery

## 2019-03-23 ENCOUNTER — Ambulatory Visit (INDEPENDENT_AMBULATORY_CARE_PROVIDER_SITE_OTHER): Payer: PPO

## 2019-03-23 ENCOUNTER — Ambulatory Visit (INDEPENDENT_AMBULATORY_CARE_PROVIDER_SITE_OTHER): Payer: PPO | Admitting: Vascular Surgery

## 2019-03-23 ENCOUNTER — Other Ambulatory Visit: Payer: Self-pay

## 2019-03-23 VITALS — BP 161/74 | HR 66 | Resp 16 | Ht 59.0 in | Wt 175.0 lb

## 2019-03-23 DIAGNOSIS — Z79899 Other long term (current) drug therapy: Secondary | ICD-10-CM

## 2019-03-23 DIAGNOSIS — I70223 Atherosclerosis of native arteries of extremities with rest pain, bilateral legs: Secondary | ICD-10-CM | POA: Diagnosis not present

## 2019-03-23 DIAGNOSIS — L309 Dermatitis, unspecified: Secondary | ICD-10-CM

## 2019-03-23 DIAGNOSIS — I70213 Atherosclerosis of native arteries of extremities with intermittent claudication, bilateral legs: Secondary | ICD-10-CM

## 2019-03-23 DIAGNOSIS — I10 Essential (primary) hypertension: Secondary | ICD-10-CM

## 2019-03-23 DIAGNOSIS — Z87891 Personal history of nicotine dependence: Secondary | ICD-10-CM

## 2019-03-23 DIAGNOSIS — E1151 Type 2 diabetes mellitus with diabetic peripheral angiopathy without gangrene: Secondary | ICD-10-CM

## 2019-03-23 DIAGNOSIS — E782 Mixed hyperlipidemia: Secondary | ICD-10-CM

## 2019-03-23 MED ORDER — DOXYCYCLINE HYCLATE 100 MG PO CAPS: 100.0000 mg | ORAL_CAPSULE | Freq: Two times a day (BID) | ORAL | 0 refills | Status: DC

## 2019-03-23 NOTE — Progress Notes (Signed)
MRN : [id number]  Christina Blackburn is a 73 y.o. ([birth year]) female who presents with chief complaint of No chief complaint on file. Marland Kitchen  History of Present Illness:   The patient returns to the office for followup and review of the noninvasive studies. There have been no interval changes in lower extremity symptoms. No interval shortening of the patient's claudication distance or development of rest pain symptoms.  She does continue to have leg pain and very short distance claudication but is not interested at further intervention at this time.  No new ulcers or wounds have occurred since the last visit.  There have been no significant changes to the patient's overall health care.  The patient denies amaurosis fugax or recent TIA symptoms. There are no recent neurological changes noted. The patient denies history of DVT, PE or superficial thrombophlebitis. The patient denies recent episodes of angina or shortness of breath.   ABI Rt=[place] (TBI 0.35) and Lt=[place] (TBI 0.23)  (previous ABI's are similar) Duplex ultrasound of the lower extremities shows patent SFA s with monophasic signals  No outpatient medications have been marked as taking for the 03/23/19 encounter (Appointment) with Delana Meyer, Dolores Lory, MD.    Past Medical History:  Diagnosis Date  . Anemia   . CHF (congestive heart failure) ([place])   . Chicken pox   . Colon polyps   . Complication of anesthesia   . Coronary artery disease   . Cystitis 1986  . Diabetes mellitus without complication ([place]) 2703  . Diverticulosis   . Heart disease 2008  . Hyperlipidemia   . Hypertension 2003  . PAD (peripheral artery disease) ([place])   . PONV (postoperative nausea and vomiting)   . Rotator cuff tendinitis   . Squamous cell skin cancer 2016    Past Surgical History:  Procedure Laterality Date  . ABDOMINAL HYSTERECTOMY  1989  . APPLICATION OF WOUND VAC Bilateral 12/02/2018   Procedure: APPLICATION OF WOUND VAC;  Surgeon: Katha Cabal, MD;  Location: [facility] ORS;  Service: Vascular;  Laterality: Bilateral;  . BREAST BIOPSY Left 2014   neg  . CARDIAC CATHETERIZATION     cornary stent  . [place]  . CHOLECYSTECTOMY  1987  . COLONOSCOPY    . COLONOSCOPY WITH PROPOFOL N/A 09/22/2018   Procedure: COLONOSCOPY WITH PROPOFOL;  Surgeon: Lollie Sails, MD;  Location: [facility] ENDOSCOPY;  Service: Endoscopy;  Laterality: N/A;  . CORONARY ANGIOPLASTY  2008   stent x1  . ENDARTERECTOMY FEMORAL Bilateral 11/12/2018   Procedure: ENDARTERECTOMY FEMORAL;  Surgeon: Katha Cabal, MD;  Location: [facility] ORS;  Service: Vascular;  Laterality: Bilateral;  . INSERTION OF ILIAC STENT Bilateral 11/12/2018   Procedure: INSERTION OF ILIAC STENT;  Surgeon: Katha Cabal, MD;  Location: [facility] ORS;  Service: Vascular;  Laterality: Bilateral;  . JOINT REPLACEMENT Right 04/12/2018   shoulder  . LOWER EXTREMITY ANGIOGRAPHY Left 08/05/2018   Procedure: LOWER EXTREMITY ANGIOGRAPHY;  Surgeon: Katha Cabal, MD;  Location: [place] CV LAB;  Service: Cardiovascular;  Laterality: Left;  . REVERSE SHOULDER ARTHROPLASTY Right 04/08/2018   Procedure: REVERSE SHOULDER ARTHROPLASTY;  Surgeon: Corky Mull, MD;  Location: [facility] ORS;  Service: Orthopedics;  Laterality: Right;  . TUBAL LIGATION    . WOUND DEBRIDEMENT Bilateral 12/02/2018   Procedure: DEBRIDEMENT WOUND;  Surgeon: Katha Cabal, MD;  Location: [facility] ORS;  Service: Vascular;  Laterality: Bilateral;    Social History Social History   Tobacco Use  .  Smoking status: Former Smoker    Years: 6.00    Types: Cigarettes    Last attempt to quit: 2008    Years since quitting: 12.4  . Smokeless tobacco: Never Used  Substance Use Topics  . Alcohol use: Yes    Comment: rare  . Drug use: No    Family History Family History  Problem Relation Age of Onset  . Colon cancer Cousin 45  . Lymphoma Brother 34  . Lung cancer Mother   . Hypertension Father   .  Kidney failure Father   . Breast cancer Neg Hx     Allergies  Allergen Reactions  . Tape Other (See Comments)    Hypoallergenic tape - blisters     REVIEW OF SYSTEMS (Negative unless checked)  Constitutional: [] Weight loss  [] Fever  [] Chills Cardiac: [] Chest pain   [] Chest pressure   [] Palpitations   [] Shortness of breath when laying flat   [] Shortness of breath with exertion. Vascular:  [x] Pain in legs with walking   [] Pain in legs at rest  [] History of DVT   [] Phlebitis   [] Swelling in legs   [] Varicose veins   [] Non-healing ulcers Pulmonary:   [] Uses home oxygen   [] Productive cough   [] Hemoptysis   [] Wheeze  [] COPD   [] Asthma Neurologic:  [] Dizziness   [] Seizures   [] History of stroke   [] History of TIA  [] Aphasia   [] Vissual changes   [] Weakness or numbness in arm   [] Weakness or numbness in leg Musculoskeletal:   [] Joint swelling   [] Joint pain   [x] Low back pain Hematologic:  [] Easy bruising  [] Easy bleeding   [] Hypercoagulable state   [] Anemic Gastrointestinal:  [] Diarrhea   [] Vomiting  [] Gastroesophageal reflux/heartburn   [] Difficulty swallowing. Genitourinary:  [] Chronic kidney disease   [] Difficult urination  [] Frequent urination   [] Blood in urine Skin:  [] Rashes   [] Ulcers  Psychological:  [] History of anxiety   []  History of major depression.  Physical Examination  There were no vitals filed for this visit. There is no height or weight on file to calculate BMI. Gen: WD/WN, NAD Head: [place]/AT, No temporalis wasting.  Ear/Nose/Throat: Hearing grossly intact, nares w/o erythema or drainage Eyes: PER, EOMI, sclera nonicteric.  Neck: Supple, no large masses.   Pulmonary:  Good air movement, no audible wheezing bilaterally, no use of accessory muscles.  Cardiac: RRR, no JVD Vascular: scattered varicosities present bilaterally.  Mild venous stasis changes to the legs bilaterally.  2+ soft pitting edema Vessel Right Left  Radial Palpable Palpable  PT Not Palpable Not  Palpable  DP Not Palpable Not Palpable  Gastrointestinal: Non-distended. No guarding/no peritoneal signs.  Musculoskeletal: M/S 5/5 throughout.  No deformity or atrophy.  Neurologic: CN 2-12 intact. Symmetrical.  Speech is fluent. Motor exam as listed above. Psychiatric: Judgment intact, Mood & affect appropriate for pt's clinical situation. Dermatologic: left ankle with moderate venous  rashes but no ulcers noted.  + changes consistent with cellulitis. Lymph : No lichenification or skin changes of chronic lymphedema.  CBC Lab Results  Component Value Date   WBC 7.3 12/01/2018   HGB 9.7 (L) 12/01/2018   HCT 31.4 (L) 12/01/2018   MCV 87.0 12/01/2018   PLT 369 12/01/2018    BMET    Component Value Date/Time   NA 140 12/01/2018 1515   K 3.6 12/01/2018 1515   CL 102 12/01/2018 1515   CO2 28 12/01/2018 1515   GLUCOSE 225 (H) 12/01/2018 1515   BUN 15 12/01/2018 1515  CREATININE 0.73 12/01/2018 1515   CALCIUM 9.7 12/01/2018 1515   GFRNONAA >60 12/01/2018 1515   GFRAA >60 12/01/2018 1515   CrCl cannot be calculated (Patient's most recent lab result is older than the maximum 21 days allowed.).  COAG Lab Results  Component Value Date   INR 1.02 12/01/2018   INR 0.97 10/30/2018   INR 0.95 04/04/2018    Radiology No results found.   Assessment/Plan 1. Atherosclerosis of native artery of both lower extremities with intermittent claudication (HCC)  No surgery or intervention at this point in time.    She does continue to have leg pain and very short distance claudication but is not interested at further intervention at this time.  I have reviewed my previous discussion with the patient regarding swelling and why it  causes symptoms.  The patient is doing well with compression and will continue wearing graduated compression stockings class 1 (20-30 mmHg) on a daily basis a prescription was given. The patient will  continue wearing the stockings first thing in the morning and  removing them in the evening. The patient is instructed specifically not to sleep in the stockings.    In addition, behavioral modification including elevation during the day and exercise will be continued.    Patient should follow-up on a biannual basis   2. Dermatitis I will start Doxycycline for one week Rx sent to [facility]  3. Essential hypertension Continue antihypertensive medications as already ordered, these medications have been reviewed and there are no changes at this time.   4. Type 2 diabetes mellitus with diabetic peripheral angiopathy without gangrene, without long-term current use of insulin ([place]) Continue hypoglycemic medications as already ordered, these medications have been reviewed and there are no changes at this time.  Hgb A1C to be monitored as already arranged by primary service   5. Mixed hyperlipidemia Continue statin as ordered and reviewed, no changes at this time   [name], MD  03/23/2019 8:44 AM

## 2019-04-01 DIAGNOSIS — L950 Livedoid vasculitis: Secondary | ICD-10-CM | POA: Diagnosis not present

## 2019-04-01 DIAGNOSIS — L570 Actinic keratosis: Secondary | ICD-10-CM | POA: Diagnosis not present

## 2019-04-15 DIAGNOSIS — M9903 Segmental and somatic dysfunction of lumbar region: Secondary | ICD-10-CM | POA: Diagnosis not present

## 2019-04-15 DIAGNOSIS — M531 Cervicobrachial syndrome: Secondary | ICD-10-CM | POA: Diagnosis not present

## 2019-04-15 DIAGNOSIS — M9902 Segmental and somatic dysfunction of thoracic region: Secondary | ICD-10-CM | POA: Diagnosis not present

## 2019-04-15 DIAGNOSIS — M9901 Segmental and somatic dysfunction of cervical region: Secondary | ICD-10-CM | POA: Diagnosis not present

## 2019-04-15 DIAGNOSIS — M5136 Other intervertebral disc degeneration, lumbar region: Secondary | ICD-10-CM | POA: Diagnosis not present

## 2019-04-15 DIAGNOSIS — M546 Pain in thoracic spine: Secondary | ICD-10-CM | POA: Diagnosis not present

## 2019-04-29 DIAGNOSIS — L950 Livedoid vasculitis: Secondary | ICD-10-CM | POA: Diagnosis not present

## 2019-04-30 DIAGNOSIS — I10 Essential (primary) hypertension: Secondary | ICD-10-CM | POA: Diagnosis not present

## 2019-04-30 DIAGNOSIS — E7800 Pure hypercholesterolemia, unspecified: Secondary | ICD-10-CM | POA: Diagnosis not present

## 2019-04-30 DIAGNOSIS — Z9889 Other specified postprocedural states: Secondary | ICD-10-CM | POA: Diagnosis not present

## 2019-05-15 DIAGNOSIS — E7800 Pure hypercholesterolemia, unspecified: Secondary | ICD-10-CM | POA: Diagnosis not present

## 2019-05-15 DIAGNOSIS — I2510 Atherosclerotic heart disease of native coronary artery without angina pectoris: Secondary | ICD-10-CM | POA: Diagnosis not present

## 2019-05-15 DIAGNOSIS — Z79899 Other long term (current) drug therapy: Secondary | ICD-10-CM | POA: Diagnosis not present

## 2019-05-15 DIAGNOSIS — E1165 Type 2 diabetes mellitus with hyperglycemia: Secondary | ICD-10-CM | POA: Diagnosis not present

## 2019-05-15 DIAGNOSIS — I10 Essential (primary) hypertension: Secondary | ICD-10-CM | POA: Diagnosis not present

## 2019-05-15 DIAGNOSIS — Z1239 Encounter for other screening for malignant neoplasm of breast: Secondary | ICD-10-CM | POA: Diagnosis not present

## 2019-05-20 DIAGNOSIS — M5136 Other intervertebral disc degeneration, lumbar region: Secondary | ICD-10-CM | POA: Diagnosis not present

## 2019-05-20 DIAGNOSIS — M546 Pain in thoracic spine: Secondary | ICD-10-CM | POA: Diagnosis not present

## 2019-05-20 DIAGNOSIS — M9903 Segmental and somatic dysfunction of lumbar region: Secondary | ICD-10-CM | POA: Diagnosis not present

## 2019-05-20 DIAGNOSIS — M9902 Segmental and somatic dysfunction of thoracic region: Secondary | ICD-10-CM | POA: Diagnosis not present

## 2019-05-20 DIAGNOSIS — M531 Cervicobrachial syndrome: Secondary | ICD-10-CM | POA: Diagnosis not present

## 2019-05-20 DIAGNOSIS — M9901 Segmental and somatic dysfunction of cervical region: Secondary | ICD-10-CM | POA: Diagnosis not present

## 2019-06-12 ENCOUNTER — Ambulatory Visit
Admission: EM | Admit: 2019-06-12 | Discharge: 2019-06-12 | Disposition: A | Discharge status: Home / Self Care | Payer: PPO | Attending: Emergency Medicine | Admitting: Emergency Medicine

## 2019-06-12 ENCOUNTER — Other Ambulatory Visit: Payer: Self-pay

## 2019-06-12 DIAGNOSIS — E1159 Type 2 diabetes mellitus with other circulatory complications: Secondary | ICD-10-CM | POA: Diagnosis not present

## 2019-06-12 DIAGNOSIS — L03116 Cellulitis of left lower limb: Secondary | ICD-10-CM | POA: Diagnosis not present

## 2019-06-12 DIAGNOSIS — I739 Peripheral vascular disease, unspecified: Secondary | ICD-10-CM

## 2019-06-12 MED ORDER — DOXYCYCLINE HYCLATE 100 MG PO CAPS: 100.0000 mg | ORAL_CAPSULE | Freq: Two times a day (BID) | ORAL | 0 refills | Status: DC

## 2019-06-12 NOTE — ED Provider Notes (Signed)
[facility]    CSN: [id number] Arrival date & time: 06/12/19  1518      History   Chief Complaint Chief Complaint  Patient presents with   Wound Infection    HPI Christina Blackburn is a 73 y.o. female.   HPI  73 year old female diabetic with peripheral arterial disease presents with a wound on the posterior aspect of her distal left leg.  She has a central portion of eschar that is necrotic.  She has undergone bilateral stenting of her legs but still is having inflow problems.  This along with her diabetes has contributed to skin breakdown and poor wound healing.  She saw Dr. Doy Hutching who placed her on mupirocin ointment which she has been using.  She also has an appointment at the wound care center next Friday.  She is also trying to get into see peripheral vascular surgeons at [facility] but that is over a month away.  She has not had any fever.  He states that the surrounding area has become very erythematous and is tender to the touch.  She has had no discharge from the wound.         Past Medical History:  Diagnosis Date   Anemia    CHF (congestive heart failure) (HCC)    Chicken pox    Colon polyps    Complication of anesthesia    Coronary artery disease    Cystitis 1986   Diabetes mellitus without complication ([place]) 99991111   Diverticulosis    Heart disease 2008   Hyperlipidemia    Hypertension 2003   PAD (peripheral artery disease) (HCC)    PONV (postoperative nausea and vomiting)    Rotator cuff tendinitis    Squamous cell skin cancer 2016    Patient Active Problem List   Diagnosis Date Noted   Dermatitis 03/23/2019   Surgical site infection 11/19/2018   Atherosclerosis of native arteries of extremity with intermittent claudication ([place]) 11/12/2018   Atherosclerosis of native arteries of extremity with rest pain ([place]) 07/16/2018   Status post reverse total shoulder replacement, 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   PAD (peripheral artery disease)  ([place]) 05/16/2017   Diabetes ([place]) 05/16/2017   Essential hypertension 05/16/2017   Hyperlipidemia 05/16/2017   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    Past Surgical History:  Procedure Laterality Date   ABDOMINAL HYSTERECTOMY  [id number]   APPLICATION OF WOUND VAC Bilateral 12/02/2018   Procedure: APPLICATION OF WOUND VAC;  Surgeon: Katha Cabal, MD;  Location: [facility] ORS;  Service: Vascular;  Laterality: Bilateral;   BREAST BIOPSY Left 2014   neg   CARDIAC CATHETERIZATION     cornary stent   [place]   COLONOSCOPY     COLONOSCOPY WITH PROPOFOL N/A 09/22/2018   Procedure: COLONOSCOPY WITH PROPOFOL;  Surgeon: Lollie Sails, MD;  Location: [facility] ENDOSCOPY;  Service: Endoscopy;  Laterality: N/A;   CORONARY ANGIOPLASTY  2008   stent x1   ENDARTERECTOMY FEMORAL Bilateral 11/12/2018   Procedure: ENDARTERECTOMY FEMORAL;  Surgeon: Katha Cabal, MD;  Location: [facility] ORS;  Service: Vascular;  Laterality: Bilateral;   INSERTION OF ILIAC STENT Bilateral 11/12/2018   Procedure: INSERTION OF ILIAC STENT;  Surgeon: Katha Cabal, MD;  Location: [facility] OR[facility];  Service: Vascular;  Laterality: Bilateral;   JOINT REPLACEMENT Right 04/12/2018   shoulder   LOWER EXTREMITY ANGIOGRAPHY Left 08/05/2018   Procedure: LOWER EXTREMITY ANGIOGRAPHY;  Surgeon: Katha Cabal, MD;  Location: [facility]  INVASIVE CV LAB;  Service: Cardiovascular;  Laterality: Left;   REVERSE SHOULDER ARTHROPLASTY Right 04/08/2018   Procedure: REVERSE SHOULDER ARTHROPLASTY;  Surgeon: Corky Mull, MD;  Location: [facility] ORS;  Service: Orthopedics;  Laterality: Right;   TUBAL LIGATION     WOUND DEBRIDEMENT Bilateral 12/02/2018   Procedure: DEBRIDEMENT WOUND;  Surgeon: Katha Cabal, MD;  Location: [facility] ORS;  Service: Vascular;  Laterality: Bilateral;    OB History    Gravida  2   Para  2   Term      Preterm      AB      Living  2     SAB      TAB       Ectopic      Multiple      Live Births           Obstetric Comments  Menstrual age: 43  Age 1st Pregnancy: 71         Home Medications    Prior to Admission medications   Medication Sig Start Date End Date Taking? Authorizing Provider  amLODipine (NORVASC) 5 MG tablet Take 5 mg by mouth daily.   Yes [provider]  aspirin 81 MG tablet Take 81 mg by mouth daily.   Yes [provider]  atorvastatin (LIPITOR) 20 MG tablet Take 20 mg by mouth daily.  03/28/17  Yes [provider]  calcium carbonate (OS-CAL) 600 MG TABS tablet Take 600 mg by mouth 2 (two) times daily with a meal.   Yes [provider]  Cholecalciferol (VITAMIN D3 PO) Take 400 Units by mouth daily.    Yes [provider]  clopidogrel (PLAVIX) 75 MG tablet Take 75 mg by mouth daily.   Yes [provider]  cyclobenzaprine (FLEXERIL) 10 MG tablet Take 10 mg by mouth 3 (three) times daily as needed for muscle spasms.   Yes [provider]  fenofibrate (TRICOR) 145 MG tablet Take 145 mg by mouth daily.   Yes [provider]  fish oil-omega-3 fatty acids 1000 MG capsule Take 1 g by mouth daily.   Yes [provider]  furosemide (LASIX) 20 MG tablet Take 20 mg by mouth as needed.   Yes [provider]  glimepiride (AMARYL) 4 MG tablet Take 4 mg by mouth daily with breakfast.    Yes [provider]  losartan-hydrochlorothiazide (HYZAAR) 100-25 MG tablet Take 1 tablet by mouth daily.  04/30/17  Yes [provider]  magnesium oxide (MAG-OX) 400 MG tablet Take 400 mg by mouth daily.   Yes [provider]  metFORMIN (GLUCOPHAGE) 1000 MG tablet Take 1,000 mg by mouth daily with breakfast.    Yes [provider]  metoprolol succinate (TOPROL-XL) 100 MG 24 hr tablet Take 100 mg by mouth daily. Take with or immediately following a meal.   Yes [provider]  nystatin cream (MYCOSTATIN) Apply 1  application topically 2 (two) times daily. 02/19/19  Yes Kris Hartmann, NP  OVER THE COUNTER MEDICATION Take 2 tablets by mouth at bedtime. Mag R&R Supplement   Yes [provider]  traZODone (DESYREL) 50 MG tablet Take 50 mg by mouth at bedtime.   Yes [provider]  TURMERIC PO Take by mouth daily.   Yes [provider]  vitamin B-12 (CYANOCOBALAMIN) 1000 MCG tablet Take 1,000 mcg by mouth daily.   Yes [provider]  vitamin E 400 UNIT capsule Take 400 Units by mouth  daily.    Yes [provider]  doxycycline (VIBRAMYCIN) 100 MG capsule Take 1 capsule (100 mg total) by mouth 2 (two) times daily. 06/12/19   Lorin Picket, PA-C    Family History Family History  Problem Relation Age of Onset   Colon cancer Cousin 48   Lymphoma Brother 66   Lung cancer Mother    Hypertension Father    Kidney failure Father    Breast cancer Neg Hx     Social History Social History   Tobacco Use   Smoking status: Former Smoker    Years: 6.00    Types: Cigarettes    Quit date: 2008    Years since quitting: 12.6   Smokeless tobacco: Never Used  Substance Use Topics   Alcohol use: Yes    Comment: rare   Drug use: No     Allergies   Tape   Review of Systems Review of Systems  Constitutional: Positive for activity change. Negative for appetite change, chills, diaphoresis, fatigue and fever.  Skin: Positive for wound.  All other systems reviewed and are negative.    Physical Exam Triage Vital Signs ED Triage Vitals  Enc Vitals Group     BP 06/12/19 1624 (!) 143/68     Pulse Rate 06/12/19 1624 72     Resp 06/12/19 1624 16     Temp 06/12/19 1624 98.2 F (36.8 C)     Temp Source 06/12/19 1624 Oral     SpO2 06/12/19 1624 98 %     Weight 06/12/19 1620 175 lb (79.4 kg)     Height 06/12/19 1620 4\' 11"  (1.499 m)     Head Circumference --      Peak Flow --      Pain Score 06/12/19 1620 1     Pain Loc --      Pain Edu? --       Excl. in [place]? --    No data found.  Updated Vital Signs BP (!) 143/68 (BP Location: Left Arm)    Pulse 72    Temp 98.2 F (36.8 C) (Oral)    Resp 16    Ht 4\' 11"  (1.499 m)    Wt 175 lb (79.4 kg)    SpO2 98%    BMI 35.35 kg/m   Visual Acuity Right Eye Distance:   Left Eye Distance:   Bilateral Distance:    Right Eye Near:   Left Eye Near:    Bilateral Near:     Physical Exam Vitals signs and nursing note reviewed.  Constitutional:      General: She is not in acute distress.    Appearance: Normal appearance. She is obese. She is not ill-appearing, toxic-appearing or diaphoretic.  HENT:     Head: Normocephalic.     Nose: Nose normal.     Mouth/Throat:     Mouth: Mucous membranes are moist.  Eyes:     Conjunctiva/sclera: Conjunctivae normal.     Pupils: Pupils are equal, round, and reactive to light.  Neck:     Musculoskeletal: Normal range of motion and neck supple.  Musculoskeletal: Normal range of motion.        General: Tenderness present.  Skin:    General: Skin is warm and dry.     Findings: Erythema and rash present.     Comments: Examination of the posterior left distal leg overlying the Achilles shows a large area of erythema that is blanchable very tender and warm.  This  extends approximately 4 inches in diameter.  In the center portion there is a necrotic area.  Refer to photographs for details  Neurological:     General: No focal deficit present.     Mental Status: She is alert and oriented to person, place, and time.  Psychiatric:        Mood and Affect: Mood normal.        Behavior: Behavior normal.        Thought Content: Thought content normal.        Judgment: Judgment normal.          UC Treatments / Results  Labs (all labs ordered are listed, but only abnormal results are displayed) Labs Reviewed - No data to display  EKG   Radiology No results found.  Procedures Procedures (including critical care time)  Medications Ordered in  UC Medications - No data to display  Initial Impression / Assessment and Plan / UC Course  I have reviewed the triage vital signs and the nursing notes.  Pertinent labs & imaging results that were available during my care of the patient were reviewed by me and considered in my medical decision making (see chart for details).   Told the patient she should contact the wound care center to see if they have any cancellations that she can be seen sooner.  In the meantime because of the possible cellulitis I will start her on doxycycline.  She will continue to use the mupirocin ointment 3 times daily.  I will place her on a warm compress program which was outlined in detail for her.  She will need to keep her diabetes under control.  She has any further problems she should contact her primary care physician.   Final Clinical Impressions(s) / UC Diagnoses   Final diagnoses:  Cellulitis of left lower extremity  PVD (peripheral vascular disease) ([place])  Type 2 diabetes mellitus with other circulatory complication, unspecified whether long term insulin use ([place])     Discharge Instructions     Wet a washcloth under the faucet and then place in the microwave oven for 10 to 15 seconds.  Place the cloth over the wound leaving it there for 10 minutes.  Dry the area thoroughly and apply mupirocin ointment to all areas of redness.  Perform this 3-4 times each and every day until the abscess has resolved.  Be certain to take all of the doxycycline.  If the area appears worsening return to our clinic or go to your primary care physician    ED Prescriptions    Medication Sig Dispense Auth. Provider   doxycycline (VIBRAMYCIN) 100 MG capsule Take 1 capsule (100 mg total) by mouth 2 (two) times daily. 14 capsule Lorin Picket, PA-C     Controlled Substance Prescriptions [place] Controlled Substance Registry consulted? Not Applicable   Juanda Crumble 06/12/19 1835

## 2019-06-12 NOTE — Discharge Instructions (Signed)
Wet a washcloth under the faucet and then place in the microwave oven for 10 to 15 seconds.  Place the cloth over the wound leaving it there for 10 minutes.  Dry the area thoroughly and apply mupirocin ointment to all areas of redness.  Perform this 3-4 times each and every day until the abscess has resolved.  Be certain to take all of the doxycycline.  If the area appears worsening return to our clinic or go to your primary care physician

## 2019-06-12 NOTE — ED Triage Notes (Signed)
Patient complains of a wound on the back of her left leg around 1 month ago. Patient states that she is unsure of when the center of the wound started turning black. Patient states that she has redness surrounding wound. Patient states that she saw Dr. Doy Hutching and he gave her mupirocin ointment for the area. States that she is set to see [place] next Friday but she is concerned about the area and wasn't sure if she could wait any longer.

## 2019-06-17 DIAGNOSIS — L0390 Cellulitis, unspecified: Secondary | ICD-10-CM | POA: Insufficient documentation

## 2019-06-17 DIAGNOSIS — I10 Essential (primary) hypertension: Secondary | ICD-10-CM | POA: Diagnosis not present

## 2019-06-17 DIAGNOSIS — L03116 Cellulitis of left lower limb: Secondary | ICD-10-CM | POA: Diagnosis not present

## 2019-06-17 DIAGNOSIS — L97829 Non-pressure chronic ulcer of other part of left lower leg with unspecified severity: Secondary | ICD-10-CM | POA: Diagnosis not present

## 2019-06-17 DIAGNOSIS — I2510 Atherosclerotic heart disease of native coronary artery without angina pectoris: Secondary | ICD-10-CM | POA: Diagnosis not present

## 2019-06-17 DIAGNOSIS — Z6834 Body mass index (BMI) 34.0-34.9, adult: Secondary | ICD-10-CM | POA: Diagnosis not present

## 2019-06-17 DIAGNOSIS — I509 Heart failure, unspecified: Secondary | ICD-10-CM | POA: Diagnosis not present

## 2019-06-17 DIAGNOSIS — Z7902 Long term (current) use of antithrombotics/antiplatelets: Secondary | ICD-10-CM | POA: Diagnosis not present

## 2019-06-17 DIAGNOSIS — I110 Hypertensive heart disease with heart failure: Secondary | ICD-10-CM | POA: Diagnosis not present

## 2019-06-17 DIAGNOSIS — I739 Peripheral vascular disease, unspecified: Secondary | ICD-10-CM | POA: Diagnosis not present

## 2019-06-17 DIAGNOSIS — Z20828 Contact with and (suspected) exposure to other viral communicable diseases: Secondary | ICD-10-CM | POA: Diagnosis not present

## 2019-06-17 DIAGNOSIS — Z7984 Long term (current) use of oral hypoglycemic drugs: Secondary | ICD-10-CM | POA: Diagnosis not present

## 2019-06-17 DIAGNOSIS — E1151 Type 2 diabetes mellitus with diabetic peripheral angiopathy without gangrene: Secondary | ICD-10-CM | POA: Diagnosis not present

## 2019-06-17 DIAGNOSIS — R740 Nonspecific elevation of levels of transaminase and lactic acid dehydrogenase [LDH]: Secondary | ICD-10-CM | POA: Diagnosis not present

## 2019-06-17 DIAGNOSIS — E785 Hyperlipidemia, unspecified: Secondary | ICD-10-CM | POA: Diagnosis not present

## 2019-06-17 DIAGNOSIS — Z87891 Personal history of nicotine dependence: Secondary | ICD-10-CM | POA: Diagnosis not present

## 2019-06-17 DIAGNOSIS — Z955 Presence of coronary angioplasty implant and graft: Secondary | ICD-10-CM | POA: Diagnosis not present

## 2019-06-17 DIAGNOSIS — Z7982 Long term (current) use of aspirin: Secondary | ICD-10-CM | POA: Diagnosis not present

## 2019-06-17 DIAGNOSIS — L539 Erythematous condition, unspecified: Secondary | ICD-10-CM | POA: Diagnosis not present

## 2019-06-17 DIAGNOSIS — Z95820 Peripheral vascular angioplasty status with implants and grafts: Secondary | ICD-10-CM | POA: Diagnosis not present

## 2019-06-17 DIAGNOSIS — M79605 Pain in left leg: Secondary | ICD-10-CM | POA: Diagnosis not present

## 2019-06-19 ENCOUNTER — Other Ambulatory Visit: Payer: Self-pay

## 2019-06-19 ENCOUNTER — Encounter: Payer: PPO | Attending: Physician Assistant | Admitting: Physician Assistant

## 2019-06-19 DIAGNOSIS — E11622 Type 2 diabetes mellitus with other skin ulcer: Secondary | ICD-10-CM | POA: Diagnosis not present

## 2019-06-19 DIAGNOSIS — I2510 Atherosclerotic heart disease of native coronary artery without angina pectoris: Secondary | ICD-10-CM | POA: Insufficient documentation

## 2019-06-19 DIAGNOSIS — Z7982 Long term (current) use of aspirin: Secondary | ICD-10-CM | POA: Insufficient documentation

## 2019-06-19 DIAGNOSIS — Z79899 Other long term (current) drug therapy: Secondary | ICD-10-CM | POA: Diagnosis not present

## 2019-06-19 DIAGNOSIS — L97922 Non-pressure chronic ulcer of unspecified part of left lower leg with fat layer exposed: Secondary | ICD-10-CM | POA: Diagnosis not present

## 2019-06-19 DIAGNOSIS — J449 Chronic obstructive pulmonary disease, unspecified: Secondary | ICD-10-CM | POA: Diagnosis not present

## 2019-06-19 DIAGNOSIS — I739 Peripheral vascular disease, unspecified: Secondary | ICD-10-CM | POA: Diagnosis not present

## 2019-06-19 DIAGNOSIS — L97222 Non-pressure chronic ulcer of left calf with fat layer exposed: Secondary | ICD-10-CM | POA: Diagnosis not present

## 2019-06-19 DIAGNOSIS — Z7984 Long term (current) use of oral hypoglycemic drugs: Secondary | ICD-10-CM | POA: Diagnosis not present

## 2019-06-19 DIAGNOSIS — Z955 Presence of coronary angioplasty implant and graft: Secondary | ICD-10-CM | POA: Diagnosis not present

## 2019-06-19 DIAGNOSIS — Z7902 Long term (current) use of antithrombotics/antiplatelets: Secondary | ICD-10-CM | POA: Insufficient documentation

## 2019-06-19 DIAGNOSIS — I10 Essential (primary) hypertension: Secondary | ICD-10-CM | POA: Diagnosis not present

## 2019-06-19 DIAGNOSIS — E1151 Type 2 diabetes mellitus with diabetic peripheral angiopathy without gangrene: Secondary | ICD-10-CM | POA: Diagnosis not present

## 2019-06-19 DIAGNOSIS — Z87891 Personal history of nicotine dependence: Secondary | ICD-10-CM | POA: Insufficient documentation

## 2019-06-19 MED ORDER — DEXTROSE 50 % IV SOLN
12.50 | INTRAVENOUS | Status: DC
Start: ? — End: 2019-06-19

## 2019-06-19 MED ORDER — AMOXICILLIN-POT CLAVULANATE 500-125 MG PO TABS
500.00 | ORAL_TABLET | ORAL | Status: DC
Start: ? — End: 2019-06-19

## 2019-06-19 MED ORDER — AMLODIPINE BESYLATE 5 MG PO TABS
5.00 | ORAL_TABLET | ORAL | Status: DC
Start: 2019-06-19 — End: 2019-06-19

## 2019-06-19 MED ORDER — VITAMIN B-12 1000 MCG PO TABS
1000.00 | ORAL_TABLET | ORAL | Status: DC
Start: 2019-06-19 — End: 2019-06-19

## 2019-06-19 MED ORDER — GENERIC EXTERNAL MEDICATION
Status: DC
Start: ? — End: 2019-06-19

## 2019-06-19 MED ORDER — ACETAMINOPHEN 500 MG PO TABS
1000.00 | ORAL_TABLET | ORAL | Status: DC
Start: 2019-06-18 — End: 2019-06-19

## 2019-06-19 MED ORDER — DOXYCYCLINE HYCLATE 100 MG PO TABS
100.00 | ORAL_TABLET | ORAL | Status: DC
Start: ? — End: 2019-06-19

## 2019-06-19 MED ORDER — GENERIC EXTERNAL MEDICATION
Status: DC
Start: 2019-06-19 — End: 2019-06-19

## 2019-06-19 MED ORDER — METOPROLOL SUCCINATE ER 100 MG PO TB24
100.00 | ORAL_TABLET | ORAL | Status: DC
Start: 2019-06-19 — End: 2019-06-19

## 2019-06-19 MED ORDER — INSULIN LISPRO 100 UNIT/ML ~~LOC~~ SOLN
0.00 | SUBCUTANEOUS | Status: DC
Start: 2019-06-18 — End: 2019-06-19

## 2019-06-19 MED ORDER — CLOPIDOGREL BISULFATE 75 MG PO TABS
75.00 | ORAL_TABLET | ORAL | Status: DC
Start: 2019-06-19 — End: 2019-06-19

## 2019-06-19 MED ORDER — ASPIRIN 81 MG PO CHEW
81.00 | CHEWABLE_TABLET | ORAL | Status: DC
Start: 2019-06-19 — End: 2019-06-19

## 2019-06-19 MED ORDER — ATORVASTATIN CALCIUM 20 MG PO TABS
20.00 | ORAL_TABLET | ORAL | Status: DC
Start: 2019-06-18 — End: 2019-06-19

## 2019-06-19 MED ORDER — POLYETHYLENE GLYCOL 3350 17 G PO PACK
17.00 | PACK | ORAL | Status: DC
Start: ? — End: 2019-06-19

## 2019-06-19 MED ORDER — MELATONIN 3 MG PO TABS
3.00 | ORAL_TABLET | ORAL | Status: DC
Start: ? — End: 2019-06-19

## 2019-06-19 NOTE — Progress Notes (Signed)
TYLASHIA, SCHAFER ([id number]) Visit Report for 06/19/2019 Abuse/Suicide Risk Screen Details Patient Name: Christina Blackburn, Christina Blackburn Date of Service: 06/19/2019 1:15 PM Medical Record Number: [id number] Patient Account Number: [id number] Date of Birth/Sex: 20-Jan-1946 (73 y.o. F) Treating RN: Harold Barban Primary Care Zahari Fazzino: [name] Other Clinician: Referring Karima Carrell: Referral, Self Treating Armella Stogner/Extender: Melburn Hake, HOYT Weeks in Treatment: 0 Abuse/Suicide Risk Screen Items Answer ABUSE RISK SCREEN: Has anyone close to you tried to hurt or harm you recentlyo No Do you feel uncomfortable with anyone in your familyo No Has anyone forced you do things that you didnot want to doo No Electronic Signature(s) Signed: 06/19/2019 3:20:28 PM By: Harold Barban Entered By: Harold Barban on 06/19/2019 13:22:06 Christina Blackburn ([id number]) -------------------------------------------------------------------------------- Activities of Daily Living Details Patient Name: Christina Blackburn Date of Service: 06/19/2019 1:15 PM Medical Record Number: [id number] Patient Account Number: [id number] Date of Birth/Sex: [birth year] (73 y.o. F) Treating RN: Harold Barban Primary Care Octavis Sheeler: [name] Other Clinician: Referring Kiandria Clum: Referral, Self Treating Keiera Strathman/Extender: Melburn Hake, HOYT Weeks in Treatment: 0 Activities of Daily Living Items Answer Activities of Daily Living (Please select one for each item) Drive Automobile Completely Able Take Medications Completely Able Use Telephone Completely Able Care for Appearance Completely Able Use Toilet Completely Able Bath / Shower Completely Able Dress Self Completely Able Feed Self Completely Able Walk Completely Able Get In / Out Bed Completely Able Housework Completely Able Prepare Meals Completely Able Handle Money Completely Able Shop for Self Completely Able Electronic Signature(s) Signed: 06/19/2019 3:20:28 PM By:  Harold Barban Entered By: Harold Barban on 06/19/2019 13:22:19 Christina Blackburn ([id number]) -------------------------------------------------------------------------------- Education Screening Details Patient Name: Christina Blackburn Date of Service: 06/19/2019 1:15 PM Medical Record Number: [id number] Patient Account Number: [id number] Date of Birth/Sex: [birth year] (74 y.o. F) Treating RN: Harold Barban Primary Care Chyan Carnero: [name] Other Clinician: Referring Shalie Schremp: Referral, Self Treating Makynzee Tigges/Extender: Sharalyn Ink in Treatment: 0 Primary Learner Assessed: Patient Learning Preferences/Education Level/Primary Language Learning Preference: Explanation Highest Education Level: College or Above Preferred Language: English Cognitive Barrier Language Barrier: No Translator Needed: No Memory Deficit: No Cultural/Religious Beliefs Affecting Medical Care: No Physical Barrier Impaired Vision: No Impaired Hearing: No Decreased Hand dexterity: No Knowledge/Comprehension Knowledge Level: High Comprehension Level: High Ability to understand written High instructions: Ability to understand verbal High instructions: Motivation Anxiety Level: Calm Cooperation: Cooperative Education Importance: Acknowledges Need Interest in Health Problems: Asks Questions Perception: Coherent Willingness to Engage in Self- High Management Activities: Readiness to Engage in Self- High Management Activities: Electronic Signature(s) Signed: 06/19/2019 3:20:28 PM By: Harold Barban Entered By: Harold Barban on 06/19/2019 13:23:02 Christina Blackburn ([id number]) -------------------------------------------------------------------------------- Fall Risk Assessment Details Patient Name: Christina Blackburn Date of Service: 06/19/2019 1:15 PM Medical Record Number: [id number] Patient Account Number: [id number] Date of Birth/Sex: 06-Nov-1945 (73 y.o. F) Treating RN: Harold Barban Primary Care Leisl Spurrier: [name] Other Clinician: Referring Monik Lins: Referral, Self Treating Tajuana Kniskern/Extender: Melburn Hake, HOYT Weeks in Treatment: 0 Fall Risk Assessment Items Have you had 2 or more falls in the last 12 monthso 0 Yes Have you had any fall that resulted in injury in the last 12 monthso 0 No FALLS RISK SCREEN History of falling - immediate or within 3 months 0 No Secondary diagnosis (Do you have 2 or more medical diagnoseso) 0 No Ambulatory aid None/bed rest/wheelchair/nurse 0 No Crutches/cane/walker 0 No Furniture 0 No Intravenous therapy Access/Saline/Heparin Lock 0 No Gait/Transferring Normal/ bed rest/ wheelchair 0 No Weak (short steps with  or without shuffle, stooped but able to lift head while 0 No walking, may seek support from furniture) Impaired (short steps with shuffle, may have difficulty arising from chair, head 0 No down, impaired balance) Mental Status Oriented to own ability 0 Yes Electronic Signature(s) Signed: 06/19/2019 3:20:28 PM By: Harold Barban Entered By: Harold Barban on 06/19/2019 13:23:35 Christina Blackburn ([id number]) -------------------------------------------------------------------------------- Foot Assessment Details Patient Name: Christina Blackburn Date of Service: 06/19/2019 1:15 PM Medical Record Number: [id number] Patient Account Number: [id number] Date of Birth/Sex: December 05, 1945 (73 y.o. F) Treating RN: Harold Barban Primary Care Jaxson Keener: [name] Other Clinician: Referring Saloni Lablanc: Referral, Self Treating Misao Fackrell/Extender: Melburn Hake, HOYT Weeks in Treatment: 0 Foot Assessment Items Site Locations + = Sensation present, - = Sensation absent, C = Callus, U = Ulcer R = Redness, W = Warmth, M = Maceration, PU = Pre-ulcerative lesion F = Fissure, S = Swelling, D = Dryness Assessment Right: Left: Other Deformity: No No Prior Foot Ulcer: No No Prior Amputation: No No Charcot Joint: No  No Ambulatory Status: Ambulatory Without Help Gait: Steady Electronic Signature(s) Signed: 06/19/2019 3:20:28 PM By: Harold Barban Entered By: Harold Barban on 06/19/2019 13:28:48 Christina Blackburn ([id number]) -------------------------------------------------------------------------------- Nutrition Risk Screening Details Patient Name: Christina Blackburn Date of Service: 06/19/2019 1:15 PM Medical Record Number: [id number] Patient Account Number: [id number] Date of Birth/Sex: 04-13-1946 (73 y.o. F) Treating RN: Harold Barban Primary Care Kenechukwu Eckstein: [name] Other Clinician: Referring Kenston Longton: Referral, Self Treating Ulus Hazen/Extender: Melburn Hake, HOYT Weeks in Treatment: 0 Height (in): 59 Weight (lbs): 170 Body Mass Index (BMI): 34.3 Nutrition Risk Screening Items Score Screening NUTRITION RISK SCREEN: I have an illness or condition that made me change the kind and/or amount of 0 No food I eat I eat fewer than two meals per day 0 No I eat few fruits and vegetables, or milk products 0 No I have three or more drinks of beer, liquor or wine almost every day 0 No I have tooth or mouth problems that make it hard for me to eat 0 No I don't always have enough money to buy the food I need 0 No I eat alone most of the time 0 No I take three or more different prescribed or over-the-counter drugs a day 1 Yes Without wanting to, I have lost or gained 10 pounds in the last six months 0 No I am not always physically able to shop, cook and/or feed myself 0 No Nutrition Protocols Good Risk Protocol Moderate Risk Protocol High Risk Proctocol Risk Level: Good Risk Score: 1 Electronic Signature(s) Signed: 06/19/2019 3:20:28 PM By: Harold Barban Entered By: Harold Barban on 06/19/2019 13:24:01

## 2019-06-19 NOTE — Progress Notes (Signed)
[place], CARMACK ([id number]) Visit Report for 06/19/2019 Allergy List Details Patient Name: Christina Blackburn, Christina Blackburn Date of Service: 06/19/2019 1:15 PM Medical Record Number: [id number] Patient Account Number: [id number] Date of Birth/Sex: 1946-02-11 (73 y.o. F) Treating RN: Harold Barban Primary Care Tripp Goins: [name] Other Clinician: Referring Johnesha Acheampong: Referral, Self Treating Karthik Whittinghill/Extender: STONE III, HOYT Weeks in Treatment: 0 Allergies Active Allergies hypo-allergenic tape Reaction: blisters Severity: Moderate Allergy Notes Electronic Signature(s) Signed: 06/19/2019 3:20:28 PM By: Harold Barban Entered By: Harold Barban on 06/19/2019 13:18:09 Christina Blackburn ([id number]) -------------------------------------------------------------------------------- Arrival Information Details Patient Name: Christina Blackburn Date of Service: 06/19/2019 1:15 PM Medical Record Number: [id number] Patient Account Number: [id number] Date of Birth/Sex: [birth year] (73 y.o. F) Treating RN: Harold Barban Primary Care Casmere Hollenbeck: [name] Other Clinician: Referring Drayce Tawil: Referral, Self Treating Lukasz Rogus/Extender: Melburn Hake, HOYT Weeks in Treatment: 0 Visit Information Patient Arrived: Ambulatory Arrival Time: 13:08 Accompanied By: self Transfer Assistance: None Patient Identification Verified: Yes Secondary Verification Process Completed: Yes Patient Has Alerts: Yes Patient Alerts: DMII History Since Last Visit Added or deleted any medications: No Any new allergies or adverse reactions: No Had a fall or experienced change in activities of daily living that may affect risk of falls: No Signs or symptoms of abuse/neglect since last visito No Hospitalized since last visit: No Has Dressing in Place as Prescribed: No Electronic Signature(s) Signed: 06/19/2019 4:03:55 PM By: Montey Hora Entered By: Montey Hora on 06/19/2019 13:54:43 Christina Blackburn  ([id number]) -------------------------------------------------------------------------------- Clinic Level of Care Assessment Details Patient Name: Christina Blackburn Date of Service: 06/19/2019 1:15 PM Medical Record Number: [id number] Patient Account Number: [id number] Date of Birth/Sex: 02-11-1946 (73 y.o. F) Treating RN: Montey Hora Primary Care Lauryl Seyer: [name] Other Clinician: Referring Serjio Deupree: Referral, Self Treating Adyson Vanburen/Extender: Melburn Hake, HOYT Weeks in Treatment: 0 Clinic Level of Care Assessment Items TOOL 1 Quantity Score [] - Use when EandM and Procedure is performed on INITIAL visit 0 ASSESSMENTS - Nursing Assessment / Reassessment X - General Physical Exam (combine w/ comprehensive assessment (listed just below) when 1 20 performed on new pt. evals) X- 1 25 Comprehensive Assessment (HX, ROS, Risk Assessments, Wounds Hx, etc.) ASSESSMENTS - Wound and Skin Assessment / Reassessment [] - Dermatologic / Skin Assessment (not related to wound area) 0 ASSESSMENTS - Ostomy and/or Continence Assessment and Care [] - Incontinence Assessment and Management 0 [] - 0 Ostomy Care Assessment and Management (repouching, etc.) PROCESS - Coordination of Care X - Simple Patient / Family Education for ongoing care 1 15 [] - 0 Complex (extensive) Patient / Family Education for ongoing care X- 1 10 Staff obtains Programmer, systems, Records, Test Results / Process Orders [] - 0 Staff telephones HHA, Nursing Homes / Clarify orders / etc [] - 0 Routine Transfer to another Facility (non-emergent condition) [] - 0 Routine Hospital Admission (non-emergent condition) X- 1 15 New Admissions / Biomedical engineer / Ordering NPWT, Apligraf, etc. [] - 0 Emergency Hospital Admission (emergent condition) PROCESS - Special Needs [] - Pediatric / Minor Patient Management 0 [] - 0 Isolation Patient Management [] - 0 Hearing / Language / Visual special needs [] - 0 Assessment of  Community assistance (transportation, D/C planning, etc.) [] - 0 Additional assistance / Altered mentation [] - 0 Support Surface(s) Assessment (bed, cushion, seat, etc.) Christina Blackburn ([id number]) INTERVENTIONS - Miscellaneous [] - External ear exam 0 [] - 0 Patient Transfer (multiple staff / Civil Service fast streamer / Similar devices) [] - 0 Simple Staple / Suture  removal (25 or less) [] - 0 Complex Staple / Suture removal (26 or more) [] - 0 Hypo/Hyperglycemic Management (do not check if billed separately) [] - 0 Ankle / Brachial Index (ABI) - do not check if billed separately Has the patient been seen at the hospital within the last three years: Yes Total Score: 85 Level Of Care: New/Established - Level 3 Electronic Signature(s) Signed: 06/19/2019 4:03:55 PM By: Montey Hora Entered By: Montey Hora on 06/19/2019 14:07:09 Christina Blackburn ([id number]) -------------------------------------------------------------------------------- Encounter Discharge Information Details Patient Name: Christina Blackburn Date of Service: 06/19/2019 1:15 PM Medical Record Number: [id number] Patient Account Number: [id number] Date of Birth/Sex: 06-16-1946 (73 y.o. F) Treating RN: Montey Hora Primary Care : [name] Other Clinician: Referring : Referral, Self Treating /Extender: Melburn Hake, HOYT Weeks in Treatment: 0 Encounter Discharge Information Items Post Procedure Vitals Discharge Condition: Stable Temperature (F): 98.6 Ambulatory Status: Ambulatory Pulse (bpm): 72 Discharge Destination: Home Respiratory Rate (breaths/min): 16 Transportation: Private Auto Blood Pressure (mmHg): 135/79 Accompanied By: self Schedule Follow-up Appointment: Yes Clinical Summary of Care: Electronic Signature(s) Signed: 06/19/2019 4:03:55 PM By: Montey Hora Entered By: Montey Hora on 06/19/2019 14:12:28 Christina Blackburn  ([id number]) -------------------------------------------------------------------------------- Lower Extremity Assessment Details Patient Name: Christina Blackburn Date of Service: 06/19/2019 1:15 PM Medical Record Number: [id number] Patient Account Number: [id number] Date of Birth/Sex: [birth year] (73 y.o. F) Treating RN: Harold Barban Primary Care : [name] Other Clinician: Referring : Referral, Self Treating /Extender: Melburn Hake, HOYT Weeks in Treatment: 0 Edema Assessment Assessed: [Left: No] [Right: No] [Left: Edema] [Right: :] Calf Left: Right: Point of Measurement: 31 cm From Medial Instep 31.5 cm 31 cm Ankle Left: Right: Point of Measurement: 10 cm From Medial Instep 21 cm 20 cm Vascular Assessment Pulses: Dorsalis Pedis Palpable: [Left:No] [Right:No] Doppler Audible: [Left:Inaudible] [Right:Yes] Posterior Tibial Palpable: [Left:No Inaudible] [Right:No Inaudible] Electronic Signature(s) Signed: 06/19/2019 3:20:28 PM By: Harold Barban Entered By: Harold Barban on 06/19/2019 13:35:01 Christina Blackburn ([id number]) -------------------------------------------------------------------------------- Multi Wound Chart Details Patient Name: Christina Blackburn Date of Service: 06/19/2019 1:15 PM Medical Record Number: [id number] Patient Account Number: [id number] Date of Birth/Sex: April 03, 1946 (73 y.o. F) Treating RN: Montey Hora Primary Care : [name] Other Clinician: Referring : Referral, Self Treating /Extender: Melburn Hake, HOYT Weeks in Treatment: 0 Vital Signs Height(in): 59 Pulse(bpm): 72 Weight(lbs): 170 Blood Pressure(mmHg): 135/79 Body Mass Index(BMI): 34 Temperature(F): 98.6 Respiratory Rate 16 (breaths/min): Photos: [N/A:N/A] Wound Location: Left Lower Leg - Posterior N/A N/A Wounding Event: Gradually Appeared N/A N/A Primary Etiology: Diabetic Wound/Ulcer of the N/A N/A Lower  Extremity Comorbid History: Chronic Obstructive N/A N/A Pulmonary Disease (COPD), Coronary Artery Disease, Hypertension, Type II Diabetes Date Acquired: 03/16/2019 N/A N/A Weeks of Treatment: 0 N/A N/A Wound Status: Open N/A N/A Measurements L x W x D 1.7x1.5x0.1 N/A N/A (cm) Area (cm) : 2.003 N/A N/A Volume (cm) : 0.2 N/A N/A % Reduction in Area: 0.00% N/A N/A % Reduction in Volume: 0.00% N/A N/A Classification: Grade 2 N/A N/A Exudate Amount: None Present N/A N/A Wound Margin: Flat and Intact N/A N/A Granulation Amount: None Present (0%) N/A N/A Necrotic Amount: Large (67-100%) N/A N/A Necrotic Tissue: Eschar N/A N/A Exposed Structures: Fascia: No N/A N/A Fat Layer (Subcutaneous Tissue) Exposed: No Tendon: No Muscle: No Christina Blackburn, Christina Blackburn ([id number]) Joint: No Bone: No Epithelialization: None N/A N/A Treatment Notes Electronic Signature(s) Signed: 06/19/2019 4:03:55 PM By: Montey Hora Entered By: Montey Hora on 06/19/2019 13:57:29 Christina Blackburn ([id number]) -------------------------------------------------------------------------------- Multi-Disciplinary  Care Plan Details Patient Name: Christina Blackburn, Christina Blackburn Date of Service: 06/19/2019 1:15 PM Medical Record Number: [id number] Patient Account Number: [id number] Date of Birth/Sex: 01/21/1946 (73 y.o. F) Treating RN: Montey Hora Primary Care : [name] Other Clinician: Referring : Referral, Self Treating /Extender: Melburn Hake, HOYT Weeks in Treatment: 0 Active Inactive Abuse / Safety / Falls / Self Care Management Nursing Diagnoses: Potential for falls Goals: Patient will remain injury free related to falls Date Initiated: 06/19/2019 Target Resolution Date: 09/19/2019 Goal Status: Active Interventions: Assess fall risk on admission and as needed Notes: Necrotic Tissue Nursing Diagnoses: Impaired tissue integrity related to necrotic/devitalized  tissue Goals: Necrotic/devitalized tissue will be minimized in the wound bed Date Initiated: 06/19/2019 Target Resolution Date: 09/19/2019 Goal Status: Active Interventions: Provide education on necrotic tissue and debridement process Notes: Orientation to the Wound Care Program Nursing Diagnoses: Knowledge deficit related to the wound healing center program Goals: Patient/caregiver will verbalize understanding of the [facility] Program Date Initiated: 06/19/2019 Target Resolution Date: 09/19/2019 Goal Status: Active Interventions: Provide education on orientation to the wound center Christina Blackburn ([id number]) Notes: Pain, Acute or Chronic Nursing Diagnoses: Pain, acute or chronic: actual or potential Goals: Patient/caregiver will verbalize comfort level met Date Initiated: 06/19/2019 Target Resolution Date: 09/19/2019 Goal Status: Active Interventions: Complete pain assessment as per visit requirements Notes: Wound/Skin Impairment Nursing Diagnoses: Impaired tissue integrity Goals: Ulcer/skin breakdown will heal within 14 weeks Date Initiated: 06/19/2019 Target Resolution Date: 09/19/2019 Goal Status: Active Interventions: Assess patient/caregiver ability to obtain necessary supplies Assess patient/caregiver ability to perform ulcer/skin care regimen upon admission and as needed Assess ulceration(s) every visit Notes: Electronic Signature(s) Signed: 06/19/2019 4:03:55 PM By: Montey Hora Entered By: Montey Hora on 06/19/2019 13:57:16 Christina Blackburn ([id number]) -------------------------------------------------------------------------------- Pain Assessment Details Patient Name: Christina Blackburn Date of Service: 06/19/2019 1:15 PM Medical Record Number: [id number] Patient Account Number: [id number] Date of Birth/Sex: 1946/01/25 (73 y.o. F) Treating RN: Harold Barban Primary Care : [name] Other Clinician: Referring :  Referral, Self Treating /Extender: Melburn Hake, HOYT Weeks in Treatment: 0 Active Problems Location of Pain Severity and Description of Pain Patient Has Paino No Site Locations Pain Management and Medication Current Pain Management: Electronic Signature(s) Signed: 06/19/2019 3:20:28 PM By: Harold Barban Entered By: Harold Barban on 06/19/2019 13:15:23 Christina Blackburn ([id number]) -------------------------------------------------------------------------------- Patient/Caregiver Education Details Patient Name: Christina Blackburn Date of Service: 06/19/2019 1:15 PM Medical Record Number: [id number] Patient Account Number: [id number] Date of Birth/Gender: [birth year] (73 y.o. F) Treating RN: Montey Hora Primary Care Physician: [name] Other Clinician: Referring Physician: Referral, Self Treating Physician/Extender: Sharalyn Ink in Treatment: 0 Education Assessment Education Provided To: Patient Education Topics Provided Wound/Skin Impairment: Handouts: Other: wound care as ordered Methods: Demonstration, Explain/Verbal Responses: State content correctly Electronic Signature(s) Signed: 06/19/2019 4:03:55 PM By: Montey Hora Entered By: Montey Hora on 06/19/2019 14:07:27 Christina Blackburn ([id number]) -------------------------------------------------------------------------------- Wound Assessment Details Patient Name: Christina Blackburn Date of Service: 06/19/2019 1:15 PM Medical Record Number: [id number] Patient Account Number: [id number] Date of Birth/Sex: Sep 14, 1946 (73 y.o. F) Treating RN: Harold Barban Primary Care : [name] Other Clinician: Referring : Referral, Self Treating /Extender: Melburn Hake, HOYT Weeks in Treatment: 0 Wound Status Wound Number: 4 Primary Diabetic Wound/Ulcer of the Lower Extremity Etiology: Wound Location: Left Lower Leg - Posterior Secondary Arterial Insufficiency Ulcer Wounding  Event: Gradually Appeared Etiology: Date Acquired: 03/16/2019 Wound Open Weeks Of Treatment: 0 Status: Clustered Wound: No Comorbid Chronic Obstructive Pulmonary Disease  History: (COPD), Coronary Artery Disease, Hypertension, Type II Diabetes Photos Wound Measurements Length: (cm) 1.7 % Reduction Width: (cm) 1.5 % Reduction Depth: (cm) 0.1 Epitheliali Area: (cm) 2.003 Tunneling: Volume: (cm) 0.2 Underminin in Area: 0% in Volume: 0% zation: None No g: No Wound Description Classification: Grade 2 Foul Odor A Wound Margin: Flat and Intact Slough/Fibr Exudate Amount: Medium Exudate Type: Serous Exudate Color: amber fter Cleansing: No ino Yes Wound Bed Granulation Amount: Small (1-33%) Exposed Structure Granulation Quality: Pink Fascia Exposed: No Necrotic Amount: Large (67-100%) Fat Layer (Subcutaneous Tissue) Exposed: Yes Necrotic Quality: Eschar, Adherent Slough Tendon Exposed: No Muscle Exposed: No Joint Exposed: No Bone Exposed: No Christina Blackburn, Christina Blackburn ([id number]) Treatment Notes Wound #4 (Left, Posterior Lower Leg) Notes santyl, saline moistened gauze, BFD Electronic Signature(s) Signed: 06/19/2019 3:20:28 PM By: Harold Barban Signed: 06/19/2019 4:03:55 PM By: Montey Hora Entered By: Montey Hora on 06/19/2019 14:07:55 Christina Blackburn ([id number]) -------------------------------------------------------------------------------- Vitals Details Patient Name: Christina Blackburn Date of Service: 06/19/2019 1:15 PM Medical Record Number: [id number] Patient Account Number: [id number] Date of Birth/Sex: [birth year] (73 y.o. F) Treating RN: Harold Barban Primary Care : [name] Other Clinician: Referring : Referral, Self Treating /Extender: Melburn Hake, HOYT Weeks in Treatment: 0 Vital Signs Time Taken: 13:15 Temperature (F): 98.6 Height (in): 59 Pulse (bpm): 72 Source: Stated Respiratory Rate (breaths/min): 16 Weight  (lbs): 170 Blood Pressure (mmHg): 135/79 Source: Stated Reference Range: 80 - 120 mg / dl Body Mass Index (BMI): 34.3 Electronic Signature(s) Signed: 06/19/2019 3:20:28 PM By: Harold Barban Entered By: Harold Barban on 06/19/2019 13:17:35

## 2019-06-20 NOTE — Progress Notes (Signed)
Christina Blackburn, Christina Blackburn ([id number]) Visit Report for 06/19/2019 Chief Complaint Document Details Patient Name: Christina Blackburn, Christina Blackburn Date of Service: 06/19/2019 1:15 PM Medical Record Number: [id number] Patient Account Number: [id number] Date of Birth/Sex: January 11, 1946 (73 y.o. F) Treating RN: Montey Hora Primary Care Provider: [name] Other Clinician: Referring Provider: Referral, Self Treating Provider/Extender: Melburn Hake, HOYT Weeks in Treatment: 0 Information Obtained from: Patient Chief Complaint Left LE Ulcer Electronic Signature(s) Signed: 06/19/2019 1:50:44 PM By: Worthy Keeler PA-C Entered By: Worthy Keeler on 06/19/2019 13:50:44 Christina Blackburn ([id number]) -------------------------------------------------------------------------------- Debridement Details Patient Name: Christina Blackburn Date of Service: 06/19/2019 1:15 PM Medical Record Number: [id number] Patient Account Number: [id number] Date of Birth/Sex: [birth year] (73 y.o. F) Treating RN: Montey Hora Primary Care Provider: [name] Other Clinician: Referring Provider: Referral, Self Treating Provider/Extender: Melburn Hake, HOYT Weeks in Treatment: 0 Debridement Performed for Wound #4 Left,Posterior Lower Leg Assessment: Performed By: Physician STONE III, HOYT E., PA-C Debridement Type: Debridement Severity of Tissue Pre Fat layer exposed Debridement: Level of Consciousness (Pre- Awake and Alert procedure): Pre-procedure Verification/Time Yes - 14:00 Out Taken: Start Time: 14:00 Pain Control: Lidocaine 4% Topical Solution Total Area Debrided (L x W): 1.7 (cm) x 1.5 (cm) = 2.55 (cm) Tissue and other material Non-Viable, Eschar debrided: Level: Non-Viable Tissue Debridement Description: Selective/Open Wound Instrument: Curette Bleeding: None End Time: 14:02 Procedural Pain: 0 Post Procedural Pain: 0 Response to Treatment: Procedure was tolerated well Level of Consciousness Awake and  Alert (Post-procedure): Post Debridement Measurements of Total Wound Length: (cm) 1.7 Width: (cm) 1.5 Depth: (cm) 0.1 Volume: (cm) 0.2 Character of Wound/Ulcer Post Debridement: Improved Severity of Tissue Post Debridement: Fat layer exposed Post Procedure Diagnosis Same as Pre-procedure Electronic Signature(s) Signed: 06/19/2019 4:03:55 PM By: Montey Hora Signed: 06/19/2019 6:57:28 PM By: Worthy Keeler PA-C Entered By: Montey Hora on 06/19/2019 14:05:25 Christina Blackburn ([id number]) -------------------------------------------------------------------------------- HPI Details Patient Name: Christina Blackburn Date of Service: 06/19/2019 1:15 PM Medical Record Number: [id number] Patient Account Number: [id number] Date of Birth/Sex: 12/30/1945 (73 y.o. F) Treating RN: Montey Hora Primary Care Provider: [name] Other Clinician: Referring Provider: Referral, Self Treating Provider/Extender: Melburn Hake, HOYT Weeks in Treatment: 0 History of Present Illness HPI Description: 06/19/2019 on evaluation today patient presents today for an initial evaluation in our clinic regarding a problem that first began in June 2020. She states that she saw a red tender spot on her lower extremity. Subsequently she saw dermatology towards the beginning to middle of July. Following this time she actually ended up going to see her primary care provider on July 31 at which point she was given an antibiotic cream she states. On August 28 she actually ended up going to urgent care because this was getting worse and was placed on doxycycline at that time. This was the first round of doxycycline. On 06/17/2019 she was actually seen in the emergency department at [facility] where she was given IV vancomycin and recommended to continue with Augmentin as well as doxycycline on an outpatient basis following. She did have an x-ray performed at [facility] according to what she tells me today and this revealed that there was  no signs of bone involvement with regard to infection. This obviously is excellent news. Right now she tells me she still having discomfort. Unfortunately she also has significant issues with peripheral vascular disease which is also somewhat unfortunate. Especially in light of the fact that the wound is obviously on her lower extremity. She actually has an  appointment to see vascular at [facility] for second opinion as well with regard to this that is not till sometime around the 21st of this month September 2020. The patient does have a history of diabetes mellitus type 2 along with again peripheral vascular disease. Her most recent TBI's revealed that her ABIs were noncompressible bilaterally with a right TBI of 0.35 and a left TBI of 0.23 obviously she does have fairly significant peripheral vascular disease in my opinion. This is post intervention including stent placement. Electronic Signature(s) Signed: 06/19/2019 6:25:38 PM By: Worthy Keeler PA-C Entered By: Worthy Keeler on 06/19/2019 18:25:38 Christina Blackburn ([id number]) -------------------------------------------------------------------------------- Physical Exam Details Patient Name: Christina Blackburn Date of Service: 06/19/2019 1:15 PM Medical Record Number: [id number] Patient Account Number: [id number] Date of Birth/Sex: October 02, 1946 (73 y.o. F) Treating RN: Montey Hora Primary Care Provider: [name] Other Clinician: Referring Provider: Referral, Self Treating Provider/Extender: Melburn Hake, HOYT Weeks in Treatment: 0 Constitutional sitting or standing blood pressure is within target range for patient.. pulse regular and within target range for patient.Marland Kitchen respirations regular, non-labored and within target range for patient.Marland Kitchen temperature within target range for patient.. Well- nourished and well-hydrated in no acute distress. Eyes conjunctiva clear no eyelid edema noted. pupils equal round and reactive to light and  accommodation. Ears, Nose, Mouth, and Throat no gross abnormality of ear auricles or external auditory canals. normal hearing noted during conversation. mucus membranes moist. Respiratory normal breathing without difficulty. clear to auscultation bilaterally. Cardiovascular regular rate and rhythm with normal S1, S2. Absent posterior tibial and dorsalis pedis pulses bilateral lower extremities. no clubbing, cyanosis, significant edema, <3 sec cap refill. Gastrointestinal (GI) soft, non-tender, non-distended, +BS. no ventral hernia noted. Musculoskeletal normal gait and posture. no significant deformity or arthritic changes, no loss or range of motion, no clubbing. Psychiatric this patient is able to make decisions and demonstrates good insight into disease process. Alert and Oriented x 3. pleasant and cooperative. Notes Upon inspection patient's wound bed x-ray showed signs of eschar covering the surface of the wound again it was somewhat difficult to tell how deep the wound actually was secondary to this. For that reason I discussed with her a light debridement to try to clear away some of the surface eschar but not being too aggressive in doing so. She was actually in agreement with this plan and subsequently I did attempt to debride away the necrotic tissue today. Patient tolerated this without complication and post debridement the wound bed appears to actually be doing much better. I was not able to get all of this off secondary to pain but nonetheless I was able to get enough off to tell that the wound does not really penetrate that deeply although it does go into the superficial subcutaneous layer. I do believe her peripheral vascular status is going to play a role in her ability to heal rather lack thereof. I am glad that she has a scheduled second opinion with [facility] vascular coming up shortly. Electronic Signature(s) Signed: 06/19/2019 6:26:57 PM By: Worthy Keeler PA-C Entered By:  Worthy Keeler on 06/19/2019 18:26:57 Christina Blackburn ([id number]) -------------------------------------------------------------------------------- Physician Orders Details Patient Name: Christina Blackburn Date of Service: 06/19/2019 1:15 PM Medical Record Number: [id number] Patient Account Number: [id number] Date of Birth/Sex: [birth year] (73 y.o. F) Treating RN: Montey Hora Primary Care Provider: [name] Other Clinician: Referring Provider: Referral, Self Treating Provider/Extender: Melburn Hake, HOYT Weeks in Treatment: 0 Verbal / Phone Orders: No Diagnosis Coding ICD-10  Coding Code Description E11.622 Type 2 diabetes mellitus with other skin ulcer L97.828 Non-pressure chronic ulcer of other part of left lower leg with other specified severity I73.89 Other specified peripheral vascular diseases Wound Cleansing Wound #4 Left,Posterior Lower Leg o Clean wound with Normal Saline. o Dial antibacterial soap, wash wounds, rinse and pat dry prior to dressing wounds o May Shower, gently pat wound dry prior to applying new dressing. Primary Wound Dressing Wound #4 Left,Posterior Lower Leg o Santyl Ointment Secondary Dressing Wound #4 Left,Posterior Lower Leg o Saline moistened gauze o Boardered Foam Dressing Dressing Change Frequency Wound #4 Left,Posterior Lower Leg o Change dressing every day. Follow-up Appointments Wound #4 Left,Posterior Lower Leg o Return Appointment in 1 week. Medications-please add to medication list. Wound #4 Left,Posterior Lower Leg o Santyl Enzymatic Ointment o Other: - Americaine 20% benzocaine spray Patient Medications Allergies: hypo-allergenic tape Notifications Medication Indication Start End Santyl 06/19/2019 DOSE topical 250 unit/gram ointment - ointment topical Apply nickel thick to the wound bed and then cover with a dressing as directed in clinic. CEARA, VENNE ([id number]) Electronic Signature(s) Signed:  06/19/2019 3:26:23 PM By: Worthy Keeler PA-C Entered By: Worthy Keeler on 06/19/2019 15:26:22 Christina Blackburn ([id number]) -------------------------------------------------------------------------------- Problem List Details Patient Name: Christina Blackburn Date of Service: 06/19/2019 1:15 PM Medical Record Number: [id number] Patient Account Number: [id number] Date of Birth/Sex: November 13, 1945 (73 y.o. F) Treating RN: Montey Hora Primary Care Provider: [name] Other Clinician: Referring Provider: Referral, Self Treating Provider/Extender: Melburn Hake, HOYT Weeks in Treatment: 0 Active Problems ICD-10 Evaluated Encounter Code Description Active Date Today Diagnosis E11.622 Type 2 diabetes mellitus with other skin ulcer 06/19/2019 No Yes L97.828 Non-pressure chronic ulcer of other part of left lower leg with 06/19/2019 No Yes other specified severity I73.89 Other specified peripheral vascular diseases 06/19/2019 No Yes Inactive Problems Resolved Problems Electronic Signature(s) Signed: 06/19/2019 1:50:11 PM By: Worthy Keeler PA-C Entered By: Worthy Keeler on 06/19/2019 13:50:11 Christina Blackburn ([id number]) -------------------------------------------------------------------------------- Progress Note Details Patient Name: Christina Blackburn Date of Service: 06/19/2019 1:15 PM Medical Record Number: [id number] Patient Account Number: [id number] Date of Birth/Sex: Dec 18, 1945 (73 y.o. F) Treating RN: Montey Hora Primary Care Provider: [name] Other Clinician: Referring Provider: Referral, Self Treating Provider/Extender: Melburn Hake, HOYT Weeks in Treatment: 0 Subjective Chief Complaint Information obtained from Patient Left LE Ulcer History of Present Illness (HPI) 06/19/2019 on evaluation today patient presents today for an initial evaluation in our clinic regarding a problem that first began in June 2020. She states that she saw a red tender spot on her lower  extremity. Subsequently she saw dermatology towards the beginning to middle of July. Following this time she actually ended up going to see her primary care provider on July 31 at which point she was given an antibiotic cream she states. On August 28 she actually ended up going to urgent care because this was getting worse and was placed on doxycycline at that time. This was the first round of doxycycline. On 06/17/2019 she was actually seen in the emergency department at [facility] where she was given IV vancomycin and recommended to continue with Augmentin as well as doxycycline on an outpatient basis following. She did have an x-ray performed at [facility] according to what she tells me today and this revealed that there was no signs of bone involvement with regard to infection. This obviously is excellent news. Right now she tells me she still having discomfort. Unfortunately she also has significant issues  with peripheral vascular disease which is also somewhat unfortunate. Especially in light of the fact that the wound is obviously on her lower extremity. She actually has an appointment to see vascular at [facility] for second opinion as well with regard to this that is not till sometime around the 21st of this month September 2020. The patient does have a history of diabetes mellitus type 2 along with again peripheral vascular disease. Her most recent TBI's revealed that her ABIs were noncompressible bilaterally with a right TBI of 0.35 and a left TBI of 0.23 obviously she does have fairly significant peripheral vascular disease in my opinion. This is post intervention including stent placement. Patient History Information obtained from Patient. Allergies hypo-allergenic tape (Severity: Moderate, Reaction: blisters) Family History Cancer - Siblings,Mother, Hypertension - Father, Kidney Disease - Father, Lung Disease - Father, Stroke - Maternal Grandparents, No family history of Diabetes, Heart Disease,  Hereditary Spherocytosis, Seizures, Thyroid Problems, Tuberculosis. Social History Former smoker - ended on 10/15/2006, Marital Status - Married, Alcohol Use - Never, Drug Use - No History, Caffeine Use - Daily. Medical History Eyes Denies history of Cataracts, Glaucoma, Optic Neuritis Ear/Nose/Mouth/Throat Denies history of Chronic sinus problems/congestion, Middle ear problems Hematologic/Lymphatic Denies history of Anemia, Hemophilia, Human Immunodeficiency Virus, Lymphedema, Sickle Cell Disease Respiratory Christina Blackburn, Christina Blackburn ([id number]) Patient has history of Chronic Obstructive Pulmonary Disease (COPD) Denies history of Aspiration, Asthma, Pneumothorax, Sleep Apnea, Tuberculosis Cardiovascular Patient has history of Coronary Artery Disease - Stent placed 2009, Hypertension Denies history of Angina, Arrhythmia, Congestive Heart Failure, Deep Vein Thrombosis, Hypotension, Myocardial Infarction, Peripheral Arterial Disease, Peripheral Venous Disease, Phlebitis, Vasculitis Gastrointestinal Denies history of Cirrhosis , Colitis, Crohn s, Hepatitis A, Hepatitis B, Hepatitis C Endocrine Patient has history of Type II Diabetes - 2004 Denies history of Type I Diabetes Genitourinary Denies history of End Stage Renal Disease Immunological Denies history of Lupus Erythematosus, Raynaud s, Scleroderma Integumentary (Skin) Denies history of History of Burn, History of pressure wounds Musculoskeletal Denies history of Gout, Rheumatoid Arthritis, Osteoarthritis, Osteomyelitis Neurologic Denies history of Dementia, Neuropathy, Quadriplegia, Paraplegia, Seizure Disorder Oncologic Denies history of Received Chemotherapy, Received Radiation Psychiatric Denies history of Anorexia/bulimia, Confinement Anxiety Hospitalization/Surgery History - Stent placed. - gall bladder. - hysterectomy. - c-section x2. - ARM 2 stents placed 10/2018. Review of Systems (ROS) Cardiovascular Denies complaints or  symptoms of Chest pain, LE edema. Gastrointestinal Denies complaints or symptoms of Frequent diarrhea, Nausea, Vomiting. Genitourinary Denies complaints or symptoms of Kidney failure/ Dialysis, Incontinence/dribbling. Immunological Denies complaints or symptoms of Hives, Itching. Integumentary (Skin) Complains or has symptoms of Wounds - Left posterior leg. Denies complaints or symptoms of Bleeding or bruising tendency, Breakdown, Swelling. Musculoskeletal Denies complaints or symptoms of Muscle Pain, Muscle Weakness. Neurologic Denies complaints or symptoms of Numbness/parasthesias, Focal/Weakness. Psychiatric Denies complaints or symptoms of Anxiety, Claustrophobia. Objective Constitutional sitting or standing blood pressure is within target range for patient.. pulse regular and within target range for patient.Marland Kitchen Christina Blackburn ([id number]) respirations regular, non-labored and within target range for patient.Marland Kitchen temperature within target range for patient.. Well- nourished and well-hydrated in no acute distress. Vitals Time Taken: 1:15 PM, Height: 59 in, Source: Stated, Weight: 170 lbs, Source: Stated, BMI: 34.3, Temperature: 98.6 F, Pulse: 72 bpm, Respiratory Rate: 16 breaths/min, Blood Pressure: 135/79 mmHg. Eyes conjunctiva clear no eyelid edema noted. pupils equal round and reactive to light and accommodation. Ears, Nose, Mouth, and Throat no gross abnormality of ear auricles or external auditory canals. normal hearing noted during conversation. mucus membranes  moist. Respiratory normal breathing without difficulty. clear to auscultation bilaterally. Cardiovascular regular rate and rhythm with normal S1, S2. Absent posterior tibial and dorsalis pedis pulses bilateral lower extremities. no clubbing, cyanosis, significant edema, Gastrointestinal (GI) soft, non-tender, non-distended, +BS. no ventral hernia noted. Musculoskeletal normal gait and posture. no significant  deformity or arthritic changes, no loss or range of motion, no clubbing. Psychiatric this patient is able to make decisions and demonstrates good insight into disease process. Alert and Oriented x 3. pleasant and cooperative. General Notes: Upon inspection patient's wound bed x-ray showed signs of eschar covering the surface of the wound again it was somewhat difficult to tell how deep the wound actually was secondary to this. For that reason I discussed with her a light debridement to try to clear away some of the surface eschar but not being too aggressive in doing so. She was actually in agreement with this plan and subsequently I did attempt to debride away the necrotic tissue today. Patient tolerated this without complication and post debridement the wound bed appears to actually be doing much better. I was not able to get all of this off secondary to pain but nonetheless I was able to get enough off to tell that the wound does not really penetrate that deeply although it does go into the superficial subcutaneous layer. I do believe her peripheral vascular status is going to play a role in her ability to heal rather lack thereof. I am glad that she has a scheduled second opinion with [facility] vascular coming up shortly. Integumentary (Hair, Skin) Wound #4 status is Open. Original cause of wound was Gradually Appeared. The wound is located on the Left,Posterior Lower Leg. The wound measures 1.7cm length x 1.5cm width x 0.1cm depth; 2.003cm^2 area and 0.2cm^3 volume. There is Fat Layer (Subcutaneous Tissue) Exposed exposed. There is no tunneling or undermining noted. There is a medium amount of serous drainage noted. The wound margin is flat and intact. There is small (1-33%) pink granulation within the wound bed. There is a large (67-100%) amount of necrotic tissue within the wound bed including Eschar and Adherent Slough. Assessment Active Problems ICD-10 Type 2 diabetes mellitus with other  skin ulcer Non-pressure chronic ulcer of other part of left lower leg with other specified severity Other specified peripheral vascular diseases Christina Blackburn, Christina Blackburn ([id number]) Procedures Wound #4 Pre-procedure diagnosis of Wound #4 is a Diabetic Wound/Ulcer of the Lower Extremity located on the Left,Posterior Lower Leg .Severity of Tissue Pre Debridement is: Fat layer exposed. There was a Selective/Open Wound Non-Viable Tissue Debridement with a total area of 2.55 sq cm performed by STONE III, HOYT E., PA-C. With the following instrument(s): Curette to remove Non-Viable tissue/material. Material removed includes Eschar after achieving pain control using Lidocaine 4% Topical Solution. No specimens were taken. A time out was conducted at 14:00, prior to the start of the procedure. There was no bleeding. The procedure was tolerated well with a pain level of 0 throughout and a pain level of 0 following the procedure. Post Debridement Measurements: 1.7cm length x 1.5cm width x 0.1cm depth; 0.2cm^3 volume. Character of Wound/Ulcer Post Debridement is improved. Severity of Tissue Post Debridement is: Fat layer exposed. Post procedure Diagnosis Wound #4: Same as Pre-Procedure Plan Wound Cleansing: Wound #4 Left,Posterior Lower Leg: Clean wound with Normal Saline. Dial antibacterial soap, wash wounds, rinse and pat dry prior to dressing wounds May Shower, gently pat wound dry prior to applying new dressing. Primary Wound Dressing: Wound #4 Left,Posterior  Lower Leg: Santyl Ointment Secondary Dressing: Wound #4 Left,Posterior Lower Leg: Saline moistened gauze Boardered Foam Dressing Dressing Change Frequency: Wound #4 Left,Posterior Lower Leg: Change dressing every day. Follow-up Appointments: Wound #4 Left,Posterior Lower Leg: Return Appointment in 1 week. Medications-please add to medication list.: Wound #4 Left,Posterior Lower Leg: Santyl Enzymatic Ointment Other: - Americaine 20%  benzocaine spray The following medication(s) was prescribed: Santyl topical 250 unit/gram ointment ointment topical Apply nickel thick to the wound bed and then cover with a dressing as directed in clinic. starting 06/19/2019 1. I would recommend that we go ahead and initiate treatment with Santyl I feel like this is the most appropriate treatment at this point to provide chemical debridement of the wound location. 2. I am and I suggest as well that we utilize a bordered foam dressing boot to protect as well as allow this area to seal off from [place]. ([id number]) the standpoint of trapping moisture and allowing the Santyl to perform his duty. 3. Again I did perform sharp debridement today but I did this very lightly again secondary to peripheral vascular status on Monday unfortunately not really recommending significant debridement as of yet. I think if she can have any type of improvement with regard to her peripheral vascular status this will make a big difference in her wound healing. Fortunately she is seeing vascular at [facility] at the end of the month. We will see patient back for reevaluation in 1 week here in the clinic. If anything worsens or changes patient will contact our office for additional recommendations. Electronic Signature(s) Signed: 06/19/2019 6:28:50 PM By: Worthy Keeler PA-C Entered By: Worthy Keeler on 06/19/2019 18:28:50 Christina Blackburn ([id number]) -------------------------------------------------------------------------------- ROS/PFSH Details Patient Name: Christina Blackburn Date of Service: 06/19/2019 1:15 PM Medical Record Number: [id number] Patient Account Number: [id number] Date of Birth/Sex: [birth year] (73 y.o. F) Treating RN: Harold Barban Primary Care Provider: [name] Other Clinician: Referring Provider: Referral, Self Treating Provider/Extender: Melburn Hake, HOYT Weeks in Treatment: 0 Information Obtained  From Patient Cardiovascular Complaints and Symptoms: Negative for: Chest pain; LE edema Medical History: Positive for: Coronary Artery Disease - Stent placed 2009; Hypertension Negative for: Angina; Arrhythmia; Congestive Heart Failure; Deep Vein Thrombosis; Hypotension; Myocardial Infarction; Peripheral Arterial Disease; Peripheral Venous Disease; Phlebitis; Vasculitis Gastrointestinal Complaints and Symptoms: Negative for: Frequent diarrhea; Nausea; Vomiting Medical History: Negative for: Cirrhosis ; Colitis; Crohnos; Hepatitis A; Hepatitis B; Hepatitis C Genitourinary Complaints and Symptoms: Negative for: Kidney failure/ Dialysis; Incontinence/dribbling Medical History: Negative for: End Stage Renal Disease Immunological Complaints and Symptoms: Negative for: Hives; Itching Medical History: Negative for: Lupus Erythematosus; Raynaudos; Scleroderma Integumentary (Skin) Complaints and Symptoms: Positive for: Wounds - Left posterior leg Negative for: Bleeding or bruising tendency; Breakdown; Swelling Medical History: Negative for: History of Burn; History of pressure wounds Musculoskeletal Complaints and Symptoms: Negative for: Muscle Pain; Muscle Weakness Christina Blackburn, Christina Blackburn ([id number]) Medical History: Negative for: Gout; Rheumatoid Arthritis; Osteoarthritis; Osteomyelitis Neurologic Complaints and Symptoms: Negative for: Numbness/parasthesias; Focal/Weakness Medical History: Negative for: Dementia; Neuropathy; Quadriplegia; Paraplegia; Seizure Disorder Psychiatric Complaints and Symptoms: Negative for: Anxiety; Claustrophobia Medical History: Negative for: Anorexia/bulimia; Confinement Anxiety Eyes Medical History: Negative for: Cataracts; Glaucoma; Optic Neuritis Ear/Nose/Mouth/Throat Medical History: Negative for: Chronic sinus problems/congestion; Middle ear problems Hematologic/Lymphatic Medical History: Negative for: Anemia; Hemophilia; Human  Immunodeficiency Virus; Lymphedema; Sickle Cell Disease Respiratory Medical History: Positive for: Chronic Obstructive Pulmonary Disease (COPD) Negative for: Aspiration; Asthma; Pneumothorax; Sleep Apnea; Tuberculosis Endocrine Medical History: Positive for: Type II Diabetes - 2004  Negative for: Type I Diabetes Time with diabetes: 15 years Treated with: Oral agents Blood sugar tested every day: Yes Tested : 2-3 times daily Blood sugar testing results: Breakfast: 84 Oncologic Medical History: Negative for: Received Chemotherapy; Received Radiation Immunizations Pneumococcal Vaccine: Christina Blackburn, Christina Blackburn ([id number]) Received Pneumococcal Vaccination: Yes Tetanus Vaccine: Last tetanus shot: 06/19/2013 Implantable Devices None Hospitalization / Surgery History Type of Hospitalization/Surgery Stent placed gall bladder hysterectomy c-section x2 ARM 2 stents placed 10/2018 Family and Social History Cancer: Yes - Siblings,Mother; Diabetes: No; Heart Disease: No; Hereditary Spherocytosis: No; Hypertension: Yes - Father; Kidney Disease: Yes - Father; Lung Disease: Yes - Father; Seizures: No; Stroke: Yes - Maternal Grandparents; Thyroid Problems: No; Tuberculosis: No; Former smoker - ended on 10/15/2006; Marital Status - Married; Alcohol Use: Never; Drug Use: No History; Caffeine Use: Daily; Financial Concerns: No; Food, Clothing or Shelter Needs: No; Support System Lacking: No; Transportation Concerns: No Electronic Signature(s) Signed: 06/19/2019 3:20:28 PM By: Harold Barban Signed: 06/19/2019 6:57:28 PM By: Worthy Keeler PA-C Entered By: Harold Barban on 06/19/2019 13:21:58 Christina Blackburn ([id number]) -------------------------------------------------------------------------------- SuperBill Details Patient Name: Christina Blackburn Date of Service: 06/19/2019 Medical Record Number: [id number] Patient Account Number: [id number] Date of Birth/Sex: Nov 19, 1945 (73 y.o. F) Treating  RN: Montey Hora Primary Care Provider: [name] Other Clinician: Referring Provider: Referral, Self Treating Provider/Extender: Melburn Hake, HOYT Weeks in Treatment: 0 Diagnosis Coding ICD-10 Codes Code Description E11.622 Type 2 diabetes mellitus with other skin ulcer L97.828 Non-pressure chronic ulcer of other part of left lower leg with other specified severity I73.89 Other specified peripheral vascular diseases Facility Procedures CPT4 Code Description: [id number] - WOUND CARE VISIT-LEV 3 EST PT Modifier: Quantity: 1 CPT4 Code Description: [id number] - DEBRIDE WOUND 1ST 20 SQ CM OR < ICD-10 Diagnosis Description L97.828 Non-pressure chronic ulcer of other part of left lower leg with o Modifier: ther specified Quantity: 1 severity Physician Procedures CPT4 Code Description: [id number] - WC PHYS LEVEL 4 - NEW PT ICD-10 Diagnosis Description E11.622 Type 2 diabetes mellitus with other skin ulcer L97.828 Non-pressure chronic ulcer of other part of left lower leg with o I73.89 Other specified  peripheral vascular diseases Modifier: 25 ther specified s Quantity: 1 everity CPT4 Code Description: [id number] - WC PHYS DEBR WO ANESTH 20 SQ CM ICD-10 Diagnosis Description L97.828 Non-pressure chronic ulcer of other part of left lower leg with o Modifier: ther specified s Quantity: 1 everity Electronic Signature(s) Signed: 06/19/2019 6:29:10 PM By: Worthy Keeler PA-C Entered By: Worthy Keeler on 06/19/2019 18:29:10

## 2019-06-23 DIAGNOSIS — E11621 Type 2 diabetes mellitus with foot ulcer: Secondary | ICD-10-CM | POA: Diagnosis not present

## 2019-06-24 DIAGNOSIS — E11621 Type 2 diabetes mellitus with foot ulcer: Secondary | ICD-10-CM | POA: Diagnosis not present

## 2019-06-26 ENCOUNTER — Ambulatory Visit: Payer: PPO | Admitting: Physician Assistant

## 2019-06-29 ENCOUNTER — Ambulatory Visit: Payer: PPO | Admitting: Physician Assistant

## 2019-06-30 ENCOUNTER — Encounter: Payer: PPO | Admitting: Physician Assistant

## 2019-06-30 ENCOUNTER — Other Ambulatory Visit: Payer: Self-pay

## 2019-06-30 DIAGNOSIS — E11622 Type 2 diabetes mellitus with other skin ulcer: Secondary | ICD-10-CM | POA: Diagnosis not present

## 2019-06-30 DIAGNOSIS — L97222 Non-pressure chronic ulcer of left calf with fat layer exposed: Secondary | ICD-10-CM | POA: Diagnosis not present

## 2019-06-30 NOTE — Progress Notes (Addendum)
KAMARRI, ASAI ([id number]) Visit Report for 06/30/2019 Chief Complaint Document Details Patient Name: Christina Blackburn, Christina Blackburn Date of Service: 06/30/2019 8:45 AM Medical Record Number: [id number] Patient Account Number: [id number] Date of Birth/Sex: 08/18/1946 (73 y.o. F) Treating RN: Montey Hora Primary Care Provider: [name] Other Clinician: Referring Provider: [name] Treating Provider/Extender: Melburn Hake, HOYT Weeks in Treatment: 1 Information Obtained from: Patient Chief Complaint Left LE Ulcer Electronic Signature(s) Signed: 06/30/2019 9:08:19 AM By: Worthy Keeler PA-C Entered By: Worthy Keeler on 06/30/2019 09:08:19 Christina Blackburn ([id number]) -------------------------------------------------------------------------------- Debridement Details Patient Name: Christina Blackburn Date of Service: 06/30/2019 8:45 AM Medical Record Number: [id number] Patient Account Number: [id number] Date of Birth/Sex: 12/28/1945 (73 y.o. F) Treating RN: Montey Hora Primary Care Provider: [name] Other Clinician: Referring Provider: [name] Treating Provider/Extender: Melburn Hake, HOYT Weeks in Treatment: 1 Debridement Performed for Wound #4 Left,Posterior Lower Leg Assessment: Performed By: Clinician Montey Hora, RN Debridement Type: Chemical/Enzymatic/Mechanical Agent Used: Santyl Severity of Tissue Pre Fat layer exposed Debridement: Level of Consciousness (Pre- Awake and Alert procedure): Pre-procedure Verification/Time Yes - 09:25 Out Taken: Start Time: 09:25 Pain Control: Lidocaine 4% Topical Solution Instrument: Other : tongue blade Bleeding: None End Time: 09:26 Procedural Pain: 0 Post Procedural Pain: 0 Response to Treatment: Procedure was tolerated well Level of Consciousness Awake and Alert (Post-procedure): Post Debridement Measurements of Total Wound Length: (cm) 2 Width: (cm) 2 Depth: (cm) 0.1 Volume: (cm)  0.314 Character of Wound/Ulcer Post Debridement: Requires Further Debridement Severity of Tissue Post Debridement: Fat layer exposed Post Procedure Diagnosis Same as Pre-procedure Electronic Signature(s) Signed: 06/30/2019 3:57:57 PM By: Montey Hora Signed: 07/02/2019 2:05:35 AM By: Worthy Keeler PA-C Entered By: Montey Hora on 06/30/2019 09:23:58 Christina Blackburn ([id number]) -------------------------------------------------------------------------------- HPI Details Patient Name: Christina Blackburn Date of Service: 06/30/2019 8:45 AM Medical Record Number: [id number] Patient Account Number: [id number] Date of Birth/Sex: 01/03/1946 (73 y.o. F) Treating RN: Montey Hora Primary Care Provider: [name] Other Clinician: Referring Provider: [name] Treating Provider/Extender: Melburn Hake, HOYT Weeks in Treatment: 1 History of Present Illness HPI Description: 06/19/2019 on evaluation today patient presents today for an initial evaluation in our clinic regarding a problem that first began in June 2020. She states that she saw a red tender spot on her lower extremity. Subsequently she saw dermatology towards the beginning to middle of July. Following this time she actually ended up going to see her primary care provider on July 31 at which point she was given an antibiotic cream she states. On August 28 she actually ended up going to urgent care because this was getting worse and was placed on doxycycline at that time. This was the first round of doxycycline. On 06/17/2019 she was actually seen in the emergency department at [facility] where she was given IV vancomycin and recommended to continue with Augmentin as well as doxycycline on an outpatient basis following. She did have an x-ray performed at [facility] according to what she tells me today and this revealed that there was no signs of bone involvement with regard to infection. This obviously is excellent news. Right now she tells  me she still having discomfort. Unfortunately she also has significant issues with peripheral vascular disease which is also somewhat unfortunate. Especially in light of the fact that the wound is obviously on her lower extremity. She actually has an appointment to see vascular at [facility] for second opinion as well with regard to this that is not till sometime around the  21st of this month September 2020. The patient does have a history of diabetes mellitus type 2 along with again peripheral vascular disease. Her most recent TBI's revealed that her ABIs were noncompressible bilaterally with a right TBI of 0.35 and a left TBI of 0.23 obviously she does have fairly significant peripheral vascular disease in my opinion. This is post intervention including stent placement. 06/30/19 on evaluation today patient appears to be doing fairly well in regard to her wound on the left posterior lower extremity. She has been tolerating the dressing changes without complication which is good news. Fortunately there's no signs of active infection. She actually does have an appointment with vascular surgery [facility] later this month. Electronic Signature(s) Signed: 07/02/2019 2:05:35 AM By: Worthy Keeler PA-C Entered By: Worthy Keeler on 07/02/2019 00:00:22 Christina Blackburn ([id number]) -------------------------------------------------------------------------------- Physical Exam Details Patient Name: Christina Blackburn Date of Service: 06/30/2019 8:45 AM Medical Record Number: [id number] Patient Account Number: [id number] Date of Birth/Sex: 1946/08/29 (72 y.o. F) Treating RN: Montey Hora Primary Care Provider: [name] Other Clinician: Referring Provider: [name] Treating Provider/Extender: Melburn Hake, HOYT Weeks in Treatment: 1 Notes Patient's wound bed is doing well and I feel like she is continuing to show some signs of improvement which is good news. With that being said I do believe that the  Santyl has been of benefit for her and I'm hopeful it will continue to be beneficial. Obviously were trying to limit sharp debridement secondary to the poor vascular state noted on her prior testing. Electronic Signature(s) Signed: 07/02/2019 2:05:35 AM By: Worthy Keeler PA-C Entered By: Worthy Keeler on 07/02/2019 00:00:57 Christina Blackburn ([id number]) -------------------------------------------------------------------------------- Physician Orders Details Patient Name: Christina Blackburn Date of Service: 06/30/2019 8:45 AM Medical Record Number: [id number] Patient Account Number: [id number] Date of Birth/Sex: 01/22/1946 (73 y.o. F) Treating RN: Montey Hora Primary Care Provider: [name] Other Clinician: Referring Provider: [name] Treating Provider/Extender: Melburn Hake, HOYT Weeks in Treatment: 1 Verbal / Phone Orders: No Diagnosis Coding ICD-10 Coding Code Description E11.622 Type 2 diabetes mellitus with other skin ulcer L97.828 Non-pressure chronic ulcer of other part of left lower leg with other specified severity I73.89 Other specified peripheral vascular diseases Wound Cleansing Wound #4 Left,Posterior Lower Leg o Clean wound with Normal Saline. o Dial antibacterial soap, wash wounds, rinse and pat dry prior to dressing wounds o May Shower, gently pat wound dry prior to applying new dressing. Primary Wound Dressing Wound #4 Left,Posterior Lower Leg o Santyl Ointment Secondary Dressing Wound #4 Left,Posterior Lower Leg o Saline moistened gauze o Boardered Foam Dressing Dressing Change Frequency Wound #4 Left,Posterior Lower Leg o Change dressing every day. Follow-up Appointments Wound #4 Left,Posterior Lower Leg o Return Appointment in 2 weeks. Medications-please add to medication list. Wound #4 Left,Posterior Lower Leg o Santyl Enzymatic Ointment o Other: - Americaine 20% benzocaine spray Electronic Signature(s) Signed:  06/30/2019 3:57:57 PM By: Montey Hora Signed: 07/02/2019 2:05:35 AM By: Worthy Keeler PA-C Entered By: Montey Hora on 06/30/2019 09:30:23 Christina Blackburn ([id number]) Christina Blackburn ([id number]) -------------------------------------------------------------------------------- Problem List Details Patient Name: Christina Blackburn Date of Service: 06/30/2019 8:45 AM Medical Record Number: [id number] Patient Account Number: [id number] Date of Birth/Sex: June 17, 1946 (73 y.o. F) Treating RN: Montey Hora Primary Care Provider: [name] Other Clinician: Referring Provider: [name] Treating Provider/Extender: Melburn Hake, HOYT Weeks in Treatment: 1 Active Problems ICD-10 Evaluated Encounter Code Description Active Date Today Diagnosis E11.622 Type 2 diabetes mellitus with  other skin ulcer 06/19/2019 No Yes L97.828 Non-pressure chronic ulcer of other part of left lower leg with 06/19/2019 No Yes other specified severity I73.89 Other specified peripheral vascular diseases 06/19/2019 No Yes Inactive Problems Resolved Problems Electronic Signature(s) Signed: 06/30/2019 9:07:54 AM By: Worthy Keeler PA-C Entered By: Worthy Keeler on 06/30/2019 09:07:54 Christina Blackburn ([id number]) -------------------------------------------------------------------------------- Progress Note Details Patient Name: Christina Blackburn Date of Service: 06/30/2019 8:45 AM Medical Record Number: [id number] Patient Account Number: [id number] Date of Birth/Sex: [birth year] (73 y.o. F) Treating RN: Montey Hora Primary Care Provider: [name] Other Clinician: Referring Provider: [name] Treating Provider/Extender: Melburn Hake, HOYT Weeks in Treatment: 1 Subjective Chief Complaint Information obtained from Patient Left LE Ulcer History of Present Illness (HPI) 06/19/2019 on evaluation today patient presents today for an initial evaluation in our clinic regarding a problem that  first began in June 2020. She states that she saw a red tender spot on her lower extremity. Subsequently she saw dermatology towards the beginning to middle of July. Following this time she actually ended up going to see her primary care provider on July 31 at which point she was given an antibiotic cream she states. On August 28 she actually ended up going to urgent care because this was getting worse and was placed on doxycycline at that time. This was the first round of doxycycline. On 06/17/2019 she was actually seen in the emergency department at [facility] where she was given IV vancomycin and recommended to continue with Augmentin as well as doxycycline on an outpatient basis following. She did have an x-ray performed at [facility] according to what she tells me today and this revealed that there was no signs of bone involvement with regard to infection. This obviously is excellent news. Right now she tells me she still having discomfort. Unfortunately she also has significant issues with peripheral vascular disease which is also somewhat unfortunate. Especially in light of the fact that the wound is obviously on her lower extremity. She actually has an appointment to see vascular at [facility] for second opinion as well with regard to this that is not till sometime around the 21st of this month September 2020. The patient does have a history of diabetes mellitus type 2 along with again peripheral vascular disease. Her most recent TBI's revealed that her ABIs were noncompressible bilaterally with a right TBI of 0.35 and a left TBI of 0.23 obviously she does have fairly significant peripheral vascular disease in my opinion. This is post intervention including stent placement. 06/30/19 on evaluation today patient appears to be doing fairly well in regard to her wound on the left posterior lower extremity. She has been tolerating the dressing changes without complication which is good news. Fortunately there's no signs  of active infection. She actually does have an appointment with vascular surgery [facility] later this month. Patient History Information obtained from Patient. Family History Cancer - Siblings,Mother, Hypertension - Father, Kidney Disease - Father, Lung Disease - Father, Stroke - Maternal Grandparents, No family history of Diabetes, Heart Disease, Hereditary Spherocytosis, Seizures, Thyroid Problems, Tuberculosis. Social History Former smoker - ended on 10/15/2006, Marital Status - Married, Alcohol Use - Never, Drug Use - No History, Caffeine Use - Daily. Medical History Eyes Denies history of Cataracts, Glaucoma, Optic Neuritis Ear/Nose/Mouth/Throat Denies history of Chronic sinus problems/congestion, Middle ear problems Hematologic/Lymphatic Denies history of Anemia, Hemophilia, Human Immunodeficiency Virus, Lymphedema, Sickle Cell Disease Respiratory Christina Blackburn, Christina Blackburn ([id number]) Patient has history of Chronic Obstructive Pulmonary Disease (  COPD) Denies history of Aspiration, Asthma, Pneumothorax, Sleep Apnea, Tuberculosis Cardiovascular Patient has history of Coronary Artery Disease - Stent placed 2009, Hypertension Denies history of Angina, Arrhythmia, Congestive Heart Failure, Deep Vein Thrombosis, Hypotension, Myocardial Infarction, Peripheral Arterial Disease, Peripheral Venous Disease, Phlebitis, Vasculitis Gastrointestinal Denies history of Cirrhosis , Colitis, Crohn s, Hepatitis A, Hepatitis B, Hepatitis C Endocrine Patient has history of Type II Diabetes - 2004 Denies history of Type I Diabetes Genitourinary Denies history of End Stage Renal Disease Immunological Denies history of Lupus Erythematosus, Raynaud s, Scleroderma Integumentary (Skin) Denies history of History of Burn, History of pressure wounds Musculoskeletal Denies history of Gout, Rheumatoid Arthritis, Osteoarthritis, Osteomyelitis Neurologic Denies history of Dementia, Neuropathy, Quadriplegia, Paraplegia,  Seizure Disorder Oncologic Denies history of Received Chemotherapy, Received Radiation Psychiatric Denies history of Anorexia/bulimia, Confinement Anxiety Hospitalization/Surgery History - Stent placed. - gall bladder. - hysterectomy. - c-section x2. - ARM 2 stents placed 10/2018. Review of Systems (ROS) Constitutional Symptoms (General Health) Denies complaints or symptoms of Fatigue, Fever, Chills, Marked Weight Change. Respiratory Denies complaints or symptoms of Chronic or frequent coughs, Shortness of Breath. Cardiovascular Complains or has symptoms of LE edema. Denies complaints or symptoms of Chest pain. Psychiatric Denies complaints or symptoms of Anxiety, Claustrophobia. Objective Constitutional Vitals Time Taken: 8:52 AM, Height: 59 in, Weight: 170 lbs, BMI: 34.3, Temperature: 97.9 F, Pulse: 72 bpm, Respiratory Rate: 16 breaths/min, Blood Pressure: 138/50 mmHg. Integumentary (Hair, Skin) Wound #4 status is Open. Original cause of wound was Gradually Appeared. The wound is located on the Left,Posterior Lower Leg. The wound measures 2cm length x 2cm width x 0.1cm depth; 3.142cm^2 area and 0.314cm^3 volume. There is Fat Layer (Subcutaneous Tissue) Exposed exposed. There is no tunneling or undermining noted. There is a medium amount of serous drainage noted. The wound margin is flat and intact. There is small (1-33%) pink granulation within the wound bed. Christina Blackburn ([id number]) There is a large (67-100%) amount of necrotic tissue within the wound bed including Adherent Slough. Assessment Active Problems ICD-10 Type 2 diabetes mellitus with other skin ulcer Non-pressure chronic ulcer of other part of left lower leg with other specified severity Other specified peripheral vascular diseases Procedures Wound #4 Pre-procedure diagnosis of Wound #4 is a Diabetic Wound/Ulcer of the Lower Extremity located on the Left,Posterior Lower Leg .Severity of Tissue Pre  Debridement is: Fat layer exposed. There was a Chemical/Enzymatic/Mechanical debridement performed by Montey Hora, RN. With the following instrument(s): tongue blade after achieving pain control using Lidocaine 4% Topical Solution. Agent used was [employer]. A time out was conducted at 09:25, prior to the start of the procedure. There was no bleeding. The procedure was tolerated well with a pain level of 0 throughout and a pain level of 0 following the procedure. Post Debridement Measurements: 2cm length x 2cm width x 0.1cm depth; 0.314cm^3 volume. Character of Wound/Ulcer Post Debridement requires further debridement. Severity of Tissue Post Debridement is: Fat layer exposed. Post procedure Diagnosis Wound #4: Same as Pre-Procedure Plan Wound Cleansing: Wound #4 Left,Posterior Lower Leg: Clean wound with Normal Saline. Dial antibacterial soap, wash wounds, rinse and pat dry prior to dressing wounds May Shower, gently pat wound dry prior to applying new dressing. Primary Wound Dressing: Wound #4 Left,Posterior Lower Leg: Santyl Ointment Secondary Dressing: Wound #4 Left,Posterior Lower Leg: Saline moistened gauze Boardered Foam Dressing Dressing Change Frequency: Wound #4 Left,Posterior Lower Leg: Change dressing every day. Follow-up Appointments: Wound #4 Left,Posterior Lower Leg: Return Appointment in 2 weeks. Medications-please add to  medication list.: Christina Blackburn, Christina Blackburn ([id number]) Wound #4 Left,Posterior Lower Leg: Santyl Enzymatic Ointment Other: - Americaine 20% benzocaine spray 1. At this point my suggestion is gonna be that we continue with the Santyl as I feel like this is beneficial for her. 2. I'm also gonna recommend that we continue with the saline was and gauze and a Boarder Foam Dressing over top of this. 3. I do recommend the patient attempt to elevate her leg as much as possible to help prevent any significant edema buildup. Please see above for specific wound  care orders. We will see patient for re-evaluation in 2 week(s) here in the clinic. If anything worsens or changes patient will contact our office for additional recommendations. Electronic Signature(s) Signed: 07/02/2019 2:05:35 AM By: Worthy Keeler PA-C Entered By: Worthy Keeler on 07/02/2019 00:02:16 Christina Blackburn ([id number]) -------------------------------------------------------------------------------- ROS/PFSH Details Patient Name: Christina Blackburn Date of Service: 06/30/2019 8:45 AM Medical Record Number: [id number] Patient Account Number: [id number] Date of Birth/Sex: Aug 13, 1946 (73 y.o. F) Treating RN: Montey Hora Primary Care Provider: [name] Other Clinician: Referring Provider: [name] Treating Provider/Extender: Melburn Hake, HOYT Weeks in Treatment: 1 Information Obtained From Patient Constitutional Symptoms (General Health) Complaints and Symptoms: Negative for: Fatigue; Fever; Chills; Marked Weight Change Respiratory Complaints and Symptoms: Negative for: Chronic or frequent coughs; Shortness of Breath Medical History: Positive for: Chronic Obstructive Pulmonary Disease (COPD) Negative for: Aspiration; Asthma; Pneumothorax; Sleep Apnea; Tuberculosis Cardiovascular Complaints and Symptoms: Positive for: LE edema Negative for: Chest pain Medical History: Positive for: Coronary Artery Disease - Stent placed 2009; Hypertension Negative for: Angina; Arrhythmia; Congestive Heart Failure; Deep Vein Thrombosis; Hypotension; Myocardial Infarction; Peripheral Arterial Disease; Peripheral Venous Disease; Phlebitis; Vasculitis Psychiatric Complaints and Symptoms: Negative for: Anxiety; Claustrophobia Medical History: Negative for: Anorexia/bulimia; Confinement Anxiety Eyes Medical History: Negative for: Cataracts; Glaucoma; Optic Neuritis Ear/Nose/Mouth/Throat Medical History: Negative for: Chronic sinus problems/congestion; Middle ear  problems Hematologic/Lymphatic Medical History: Negative for: Anemia; Hemophilia; Human Immunodeficiency Virus; Lymphedema; Sickle Cell Disease Christina Blackburn, Christina Blackburn ([id number]) Gastrointestinal Medical History: Negative for: Cirrhosis ; Colitis; Crohnos; Hepatitis A; Hepatitis B; Hepatitis C Endocrine Medical History: Positive for: Type II Diabetes - 2004 Negative for: Type I Diabetes Time with diabetes: 15 years Treated with: Oral agents Blood sugar tested every day: Yes Tested : 2-3 times daily Blood sugar testing results: Breakfast: 84 Genitourinary Medical History: Negative for: End Stage Renal Disease Immunological Medical History: Negative for: Lupus Erythematosus; Raynaudos; Scleroderma Integumentary (Skin) Medical History: Negative for: History of Burn; History of pressure wounds Musculoskeletal Medical History: Negative for: Gout; Rheumatoid Arthritis; Osteoarthritis; Osteomyelitis Neurologic Medical History: Negative for: Dementia; Neuropathy; Quadriplegia; Paraplegia; Seizure Disorder Oncologic Medical History: Negative for: Received Chemotherapy; Received Radiation Immunizations Pneumococcal Vaccine: Received Pneumococcal Vaccination: Yes Tetanus Vaccine: Last tetanus shot: 06/19/2013 Implantable Devices None Hospitalization / Surgery History Type of Hospitalization/Surgery Stent placed Christina Blackburn, Christina Blackburn ([id number]) gall bladder hysterectomy c-section x2 ARM 2 stents placed 10/2018 Family and Social History Cancer: Yes - Siblings,Mother; Diabetes: No; Heart Disease: No; Hereditary Spherocytosis: No; Hypertension: Yes - Father; Kidney Disease: Yes - Father; Lung Disease: Yes - Father; Seizures: No; Stroke: Yes - Maternal Grandparents; Thyroid Problems: No; Tuberculosis: No; Former smoker - ended on 10/15/2006; Marital Status - Married; Alcohol Use: Never; Drug Use: No History; Caffeine Use: Daily; Financial Concerns: No; Food, Clothing or Shelter Needs:  No; Support System Lacking: No; Transportation Concerns: No Physician Affirmation I have reviewed and agree with the above information. Electronic Signature(s) Signed: 07/02/2019 2:05:35 AM  By: Worthy Keeler PA-C Signed: 07/03/2019 4:27:31 PM By: Montey Hora Entered By: Worthy Keeler on 07/02/2019 00:00:50 Christina Blackburn ([id number]) -------------------------------------------------------------------------------- SuperBill Details Patient Name: Christina Blackburn Date of Service: 06/30/2019 Medical Record Number: [id number] Patient Account Number: [id number] Date of Birth/Sex: 03-Feb-1946 (73 y.o. F) Treating RN: Montey Hora Primary Care Provider: [name] Other Clinician: Referring Provider: [name] Treating Provider/Extender: Melburn Hake, HOYT Weeks in Treatment: 1 Diagnosis Coding ICD-10 Codes Code Description E11.622 Type 2 diabetes mellitus with other skin ulcer L97.828 Non-pressure chronic ulcer of other part of left lower leg with other specified severity I73.89 Other specified peripheral vascular diseases Facility Procedures CPT4 Code: [id number] Description: [phone] - [place] W/O ANES NON SELECT Modifier: Quantity: 1 Physician Procedures CPT4 Code Description: [id number] [place] - WC PHYS LEVEL 4 - EST PT ICD-10 Diagnosis Description E11.622 Type 2 diabetes mellitus with other skin ulcer L97.828 Non-pressure chronic ulcer of other part of left lower leg with I73.89 Other specified peripheral  vascular diseases Modifier: other specified s Quantity: 1 everity Electronic Signature(s) Signed: 07/02/2019 2:05:35 AM By: Worthy Keeler PA-C Entered By: Worthy Keeler on 06/30/2019 23:22:56

## 2019-06-30 NOTE — Progress Notes (Signed)
MISSIE, GEHRIG ([id number]) Visit Report for 06/30/2019 Arrival Information Details Patient Name: Christina Blackburn, Christina Blackburn Date of Service: 06/30/2019 8:45 AM Medical Record Number: [id number] Patient Account Number: [id number] Date of Birth/Sex: 30-Mar-1946 (73 y.o. F) Treating RN: Army Melia Primary Care Aloria Looper: [name] Other Clinician: Referring Javonta Gronau: [name] Treating Roldan Laforest/Extender: Melburn Hake, HOYT Weeks in Treatment: 1 Visit Information History Since Last Visit Added or deleted any medications: No Patient Arrived: Ambulatory Any new allergies or adverse reactions: No Arrival Time: 08:52 Had a fall or experienced change in No Accompanied By: self activities of daily living that may affect Transfer Assistance: None risk of falls: Patient Identification Verified: Yes Signs or symptoms of abuse/neglect since last visito No Patient Has Alerts: Yes Hospitalized since last visit: No Patient Alerts: DMII Has Dressing in Place as Prescribed: Yes ABI [place] BILATERAL >220 Pain Present Now: No 03/23/19 TBI L .23 R .35 Electronic Signature(s) Signed: 06/30/2019 3:57:57 PM By: Montey Hora Entered By: Montey Hora on 06/30/2019 09:29:50 Christina Blackburn ([id number]) -------------------------------------------------------------------------------- Encounter Discharge Information Details Patient Name: Christina Blackburn Date of Service: 06/30/2019 8:45 AM Medical Record Number: [id number] Patient Account Number: [id number] Date of Birth/Sex: May 20, 1946 (73 y.o. F) Treating RN: Montey Hora Primary Care Kaity Pitstick: [name] Other Clinician: Referring Rhyker Silversmith: [name] Treating Jaymarion Trombly/Extender: Melburn Hake, HOYT Weeks in Treatment: 1 Encounter Discharge Information Items Post Procedure Vitals Discharge Condition: Stable Temperature (F): 97.9 Ambulatory Status: Ambulatory Pulse (bpm): 72 Discharge Destination: Home Respiratory Rate  (breaths/min): 16 Transportation: Private Auto Blood Pressure (mmHg): 138/50 Accompanied By: self Schedule Follow-up Appointment: Yes Clinical Summary of Care: Electronic Signature(s) Signed: 06/30/2019 3:57:57 PM By: Montey Hora Entered By: Montey Hora on 06/30/2019 09:25:10 Christina Blackburn ([id number]) -------------------------------------------------------------------------------- Lower Extremity Assessment Details Patient Name: Christina Blackburn Date of Service: 06/30/2019 8:45 AM Medical Record Number: [id number] Patient Account Number: [id number] Date of Birth/Sex: 11/22/1945 (73 y.o. F) Treating RN: Army Melia Primary Care Maxcine Strong: [name] Other Clinician: Referring Ilisa Hayworth: [name] Treating Sandi Towe/Extender: STONE III, HOYT Weeks in Treatment: 1 Edema Assessment Assessed: [Left: No] [Right: No] Edema: [Left: N] [Right: o] Vascular Assessment Pulses: Dorsalis Pedis Palpable: [Left:Yes] Electronic Signature(s) Signed: 06/30/2019 11:24:57 AM By: Army Melia Entered By: Army Melia on 06/30/2019 08:57:02 Christina Blackburn ([id number]) -------------------------------------------------------------------------------- Multi Wound Chart Details Patient Name: Christina Blackburn Date of Service: 06/30/2019 8:45 AM Medical Record Number: [id number] Patient Account Number: [id number] Date of Birth/Sex: 03-22-1946 (73 y.o. 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 N/A N/A Comorbid History: Chronic Obstructive N/A N/A Pulmonary Disease (COPD), Coronary Artery Disease, Hypertension, Type II Diabetes Date Acquired: 03/16/2019 N/A N/A Weeks of Treatment: 1 N/A N/A Wound Status: Open N/A N/A Measurements L x W x D 2x2x0.1 N/A N/A (cm) Area (cm) : 3.142 N/A N/A Volume (cm) : 0.314 N/A N/A % Reduction in Area: -56.90% N/A N/A % Reduction in Volume: -57.00% N/A N/A Classification: Grade 2 N/A N/A Exudate Amount: Medium N/A N/A Exudate Type: Serous N/A N/A Exudate Color: amber N/A N/A Wound Margin: Flat and Intact N/A N/A Granulation Amount: Small (1-33%)  N/A N/A Granulation Quality: Pink N/A N/A Necrotic Amount: Large (67-100%) N/A N/A Exposed Structures: Fat Layer (Subcutaneous N/A N/A Tissue) Exposed: Yes Fascia: No Christina Blackburn, Christina Blackburn ([id number]) Tendon: No Muscle: No Joint: No Bone: No Epithelialization: None N/A N/A Treatment Notes Electronic Signature(s) Signed: 06/30/2019 3:57:57 PM By: Montey Hora Entered By: Montey Hora on 06/30/2019 09:21:12 Christina Blackburn ([id number]) -------------------------------------------------------------------------------- [place] Details Patient Name: Christina Blackburn Date of Service: 06/30/2019 8:45 AM Medical Record Number: [id number] Patient Account Number: [id number] Date of Birth/Sex: Nov 25, 1945 (73 y.o. F) Treating RN: Montey Hora Primary Care Ledia Hanford: [name] Other Clinician: Referring Kahmya Pinkham: [name] Treating Shakya Sebring/Extender: Melburn Hake, HOYT Weeks in Treatment: 1 Active Inactive Abuse / Safety / Falls / Self Care Management Nursing Diagnoses: Potential for falls Goals: Patient will remain injury free related to falls Date Initiated: 06/19/2019 Target Resolution Date: 09/19/2019 Goal Status: Active Interventions: Assess fall risk on admission and as  needed Notes: Necrotic Tissue Nursing Diagnoses: Impaired tissue integrity related to necrotic/devitalized tissue Goals: Necrotic/devitalized tissue will be minimized in the wound bed Date Initiated: 06/19/2019 Target Resolution Date: 09/19/2019 Goal Status: Active Interventions: Provide education on necrotic tissue and debridement process Notes: Orientation to the Wound Care Program Nursing Diagnoses: Knowledge deficit related to the wound healing center program Goals: Patient/caregiver will verbalize understanding of the [facility] Date Initiated: 06/19/2019 Target Resolution Date: 09/19/2019 Goal Status: Active Interventions: Provide education on orientation to the wound center Christina Blackburn ([id number]) Notes: Pain, Acute or Chronic Nursing Diagnoses: Pain, acute or chronic: actual or potential Goals: Patient/caregiver will verbalize comfort level met Date Initiated: 06/19/2019 Target Resolution Date: 09/19/2019 Goal Status: Active Interventions: Complete pain assessment as per visit requirements Notes: Wound/Skin Impairment Nursing Diagnoses: Impaired tissue integrity Goals: Ulcer/skin breakdown will heal within 14 weeks Date Initiated: 06/19/2019 Target Resolution Date: 09/19/2019 Goal Status: Active Interventions: Assess patient/caregiver ability to obtain necessary supplies Assess patient/caregiver ability to perform ulcer/skin care regimen upon admission and as needed Assess ulceration(s) every visit Notes: Electronic Signature(s) Signed: 06/30/2019 3:57:57 PM By: Montey Hora Entered By: Montey Hora on 06/30/2019 09:21:03 Christina Blackburn ([id number]) -------------------------------------------------------------------------------- Pain Assessment Details Patient Name: Christina Blackburn Date of Service: 06/30/2019 8:45 AM Medical Record Number: [id number] Patient Account Number: [id number] Date of Birth/Sex: January 14, 1946 (73 y.o.  F) Treating RN: Army Melia Primary Care Jace Dowe: [name] Other Clinician: Referring Ryian Lynde: [name] Treating Ardean Simonich/Extender: Melburn Hake, HOYT Weeks in Treatment: 1 Active Problems Location of Pain Severity and Description of Pain Patient Has Paino No Site Locations Pain Management and Medication Current Pain Management: Electronic Signature(s) Signed: 06/30/2019 11:24:57 AM By: Army Melia Entered By: Army Melia on 06/30/2019 08:52:26 Christina Blackburn ([id number]) -------------------------------------------------------------------------------- Patient/Caregiver Education Details Patient Name: Christina Blackburn Date of Service: 06/30/2019 8:45 AM Medical Record Number: [id number] Patient Account Number: [id number] Date of Birth/Gender: 1946/07/01 (73 y.o. F) Treating RN: Montey Hora Primary Care Physician: [name] Other Clinician: Referring Physician: [name] Treating Physician/Extender: Sharalyn Ink in Treatment: 1 Education Assessment Education Provided To: Patient Education Topics Provided Wound/Skin Impairment: Handouts: Other: continued need for santyl Methods: Explain/Verbal Responses: State content correctly Electronic Signature(s) Signed: 06/30/2019 3:57:57 PM By: Montey Hora Entered By: Montey Hora on 06/30/2019 09:24:25 Christina Blackburn ([id number]) -------------------------------------------------------------------------------- Wound Assessment Details Patient Name: Christina Blackburn Date of Service: 06/30/2019 8:45 AM Medical Record Number: [id number] Patient Account Number: [id number] Date of Birth/Sex: [birth year] (73 y.o. F) Treating RN: Army Melia Primary Care Adianna Darwin: Doy Hutching,  Dellis Filbert Other Clinician: Referring Kemon Devincenzi: [name] Treating Jearline Hirschhorn/Extender: Melburn Hake, HOYT Weeks in Treatment: 1 Wound Status Wound Number: 4 Primary Diabetic Wound/Ulcer of the Lower  Extremity Etiology: Wound Location: Left Lower Leg - Posterior Secondary Arterial Insufficiency Ulcer Wounding Event: Gradually Appeared Etiology: Date Acquired: 03/16/2019 Wound Open Weeks Of Treatment: 1 Status: Clustered Wound: No Comorbid Chronic Obstructive Pulmonary Disease History: (COPD), Coronary Artery Disease, Hypertension, Type II Diabetes Photos Wound Measurements Length: (cm) 2 % Reduction in Width: (cm) 2 % Reduction in Depth: (cm) 0.1 Epithelializat Area: (cm) 3.142 Tunneling: Volume: (cm) 0.314 Undermining: Area: -56.9% Volume: -57% ion: None No No Wound Description Classification: Grade 2 Foul Odor Afte Wound Margin: Flat and Intact Slough/Fibrino Exudate Amount: Medium Exudate Type: Serous Exudate Color: amber r Cleansing: No Yes Wound Bed Granulation Amount: Small (1-33%) Exposed Structure Granulation Quality: Pink Fascia Exposed: No Necrotic Amount: Large (67-100%) Fat Layer (Subcutaneous Tissue) Exposed: Yes Necrotic Quality: Adherent Slough Tendon Exposed: No Muscle Exposed: No Joint Exposed: No Bone Exposed: No Christina Blackburn, Christina Blackburn ([id number]) Treatment Notes Wound #4 (Left, Posterior Lower Leg) Notes santyl, saline moistened gauze, BFD Electronic Signature(s) Signed: 06/30/2019 11:24:57 AM By: Army Melia Entered By: Army Melia on 06/30/2019 08:56:23 Christina Blackburn ([id number]) -------------------------------------------------------------------------------- Vitals Details Patient Name: Christina Blackburn Date of Service: 06/30/2019 8:45 AM Medical Record Number: [id number] Patient Account Number: [id number] Date of Birth/Sex: [birth year] (73 y.o. F) Treating RN: Army Melia Primary Care Admiral Marcucci: [name] Other Clinician: Referring Keiden Deskin: [name] Treating Lashundra Shiveley/Extender: Melburn Hake, HOYT Weeks in Treatment: 1 Vital Signs Time Taken: 08:52 Temperature (F): 97.9 Height (in): 59 Pulse (bpm):  72 Weight (lbs): 170 Respiratory Rate (breaths/min): 16 Body Mass Index (BMI): 34.3 Blood Pressure (mmHg): 138/50 Reference Range: 80 - 120 mg / dl Electronic Signature(s) Signed: 06/30/2019 11:24:57 AM By: Army Melia Entered By: Army Melia on 06/30/2019 08:53:52

## 2019-07-07 ENCOUNTER — Ambulatory Visit: Payer: PPO | Admitting: Physician Assistant

## 2019-07-07 DIAGNOSIS — I739 Peripheral vascular disease, unspecified: Secondary | ICD-10-CM | POA: Diagnosis not present

## 2019-07-14 ENCOUNTER — Encounter: Payer: PPO | Admitting: Physician Assistant

## 2019-07-14 ENCOUNTER — Other Ambulatory Visit: Payer: Self-pay

## 2019-07-14 DIAGNOSIS — E1165 Type 2 diabetes mellitus with hyperglycemia: Secondary | ICD-10-CM | POA: Diagnosis not present

## 2019-07-14 DIAGNOSIS — E11622 Type 2 diabetes mellitus with other skin ulcer: Secondary | ICD-10-CM | POA: Diagnosis not present

## 2019-07-14 DIAGNOSIS — S81802D Unspecified open wound, left lower leg, subsequent encounter: Secondary | ICD-10-CM | POA: Diagnosis not present

## 2019-07-14 DIAGNOSIS — E7800 Pure hypercholesterolemia, unspecified: Secondary | ICD-10-CM | POA: Diagnosis not present

## 2019-07-14 DIAGNOSIS — Z79899 Other long term (current) drug therapy: Secondary | ICD-10-CM | POA: Diagnosis not present

## 2019-07-14 DIAGNOSIS — I10 Essential (primary) hypertension: Secondary | ICD-10-CM | POA: Diagnosis not present

## 2019-07-14 DIAGNOSIS — L97222 Non-pressure chronic ulcer of left calf with fat layer exposed: Secondary | ICD-10-CM | POA: Diagnosis not present

## 2019-07-14 NOTE — Progress Notes (Addendum)
Christina Blackburn ([id number]) Visit Report for 07/14/2019 Chief Complaint Document Details Patient Name: Christina Blackburn Date of Service: 07/14/2019 8:30 AM Medical Record Number: [id number] Patient Account Number: [id number] Date of Birth/Sex: 1946-02-16 (73 y.o. F) Treating RN: Montey Hora Primary Care Provider: [name] Other Clinician: Referring Provider: [name] Treating Provider/Extender: Melburn Hake, HOYT Weeks in Treatment: 3 Information Obtained from: Patient Chief Complaint Left LE Ulcer Electronic Signature(s) Signed: 07/14/2019 8:32:20 AM By: Worthy Keeler PA-C Entered By: Worthy Keeler on 07/14/2019 08:32:20 Christina Blackburn ([id number]) -------------------------------------------------------------------------------- HPI Details Patient Name: Christina Blackburn Date of Service: 07/14/2019 8:30 AM Medical Record Number: [id number] Patient Account Number: [id number] Date of Birth/Sex: May 10, 1946 (73 y.o. F) Treating RN: Montey Hora Primary Care Provider: [name] Other Clinician: Referring Provider: [name] Treating Provider/Extender: Melburn Hake, HOYT Weeks in Treatment: 3 History of Present Illness HPI Description: 06/19/2019 on evaluation today patient presents today for an initial evaluation in our clinic regarding a problem that first began in June 2020. She states that she saw a red tender spot on her lower extremity. Subsequently she saw dermatology towards the beginning to middle of July. Following this time she actually ended up going to see her primary care provider on July 31 at which point she was given an antibiotic cream she states. On August 28 she actually ended up going to urgent care because this was getting worse and was placed on doxycycline at that time. This was the first round of doxycycline. On 06/17/2019 she was actually seen in the emergency department at [facility] where she was given IV vancomycin and recommended to  continue with Augmentin as well as doxycycline on an outpatient basis following. She did have an x-ray performed at [facility] according to what she tells me today and this revealed that there was no signs of bone involvement with regard to infection. This obviously is excellent news. Right now she tells me she still having discomfort. Unfortunately she also has significant issues with peripheral vascular disease which is also somewhat unfortunate. Especially in light of the fact that the wound is obviously on her lower extremity. She actually has an appointment to see vascular at [facility] for second opinion as well with regard to this that is not till sometime around the 21st of this month September 2020. The patient does have a history of diabetes mellitus type 2 along with again peripheral vascular disease. Her most recent TBI's revealed that her ABIs were noncompressible bilaterally with a right TBI of 0.35 and a left TBI of 0.23 obviously she does have fairly significant peripheral vascular disease in my opinion. This is post intervention including stent placement. 06/30/19 on evaluation today patient appears to be doing fairly well in regard to her wound on the left posterior lower extremity. She has been tolerating the dressing changes without complication which is good news. Fortunately there's no signs of active infection. She actually does have an appointment with vascular surgery [facility] later this month. 07/14/2019 on evaluation today actually did see the patient back for reevaluation. Unfortunately the wound actually appears to be getting larger not better with everything that we have been trying up to this point. She did see vascular at [facility] last week. They unfortunately stated that they did not fill in their opinion that the patient would benefit from revascularization attempts as far as getting this wound to heal. It was noted in the note that if she is not healing appropriately and has any worsening of  the wound that she may come back for revascularization at that point but they were not confident that that would make a difference in the wound healing. Nonetheless they did tell her that they felt like the wound would be able to heal but just would take a very long time. The patient obviously is somewhat frustrated at this point which I completely understand. The wound also is measuring larger today compared to last week. Electronic Signature(s) Signed: 07/14/2019 9:01:49 AM By: Worthy Keeler PA-C Entered By: Worthy Keeler on 07/14/2019 09:01:49 Christina Blackburn ([id number]) -------------------------------------------------------------------------------- Physical Exam Details Patient Name: Christina Blackburn Date of Service: 07/14/2019 8:30 AM Medical Record Number: [id number] Patient Account Number: [id number] Date of Birth/Sex: [birth year] (73 y.o. F) Treating RN: Montey Hora Primary Care Provider: [name] Other Clinician: Referring Provider: [name] Treating Provider/Extender: Melburn Hake, HOYT Weeks in Treatment: 3 Constitutional Well-nourished and well-hydrated in no acute distress. Respiratory normal breathing without difficulty. clear to auscultation bilaterally. Cardiovascular regular rate and rhythm with normal S1, S2. Psychiatric this patient is able to make decisions and demonstrates good insight into disease process. Alert and Oriented x 3. pleasant and cooperative. Notes His wound bed currently showed signs of necrotic tissue still on the surface of the wound it really does not appear to be cleaning up as well as I would like to see. Fortunately there is no evidence of active infection obvious although there is some erythema surrounding I believe is just more due to inflammation and not necessarily infection although I did obtain a wound culture today to evaluate for the possibility of infection as this could be one reason why the wound is not  progressing and in fact is digressing. Nonetheless I did not place her on any antibiotics currently until I see the results of this culture I really do not want to put her on anything unnecessarily. She really does not have any edema of the leg I do not believe this is venous in nature I really feel like this is more likely arterial based on what I am seeing. Electronic Signature(s) Signed: 07/14/2019 9:02:45 AM By: Worthy Keeler PA-C Entered By: Worthy Keeler on 07/14/2019 09:02:45 Christina Blackburn ([id number]) -------------------------------------------------------------------------------- Physician Orders Details Patient Name: Christina Blackburn Date of Service: 07/14/2019 8:30 AM Medical Record Number: [id number] Patient Account Number: [id number] Date of Birth/Sex: 1946/05/18 (73 y.o. F) Treating RN: Montey Hora Primary Care Provider: [name] Other Clinician: Referring Provider: [name] Treating Provider/Extender: Melburn Hake, HOYT Weeks in Treatment: 3 Verbal / Phone Orders: No Diagnosis Coding ICD-10 Coding Code Description E11.622 Type 2 diabetes mellitus with other skin ulcer L97.828 Non-pressure chronic ulcer of other part of left lower leg with other specified severity I73.89 Other specified peripheral vascular diseases Wound Cleansing Wound #4 Left,Posterior Lower Leg o Clean wound with Normal Saline. o Dial antibacterial soap, wash wounds, rinse and pat dry prior to dressing wounds o May Shower, gently pat wound dry prior to applying new dressing. Primary Wound Dressing Wound #4 Left,Posterior Lower Leg o Iodoflex Secondary Dressing Wound #4 Left,Posterior Lower Leg o Boardered Foam Dressing Dressing Change Frequency Wound #4 Left,Posterior Lower Leg o Change dressing every other day. Follow-up Appointments Wound #4 Left,Posterior Lower Leg o Return Appointment in 1 week. Medications-please add to medication list. Wound #4  Left,Posterior Lower Leg o Other: - Americaine 20% benzocaine spray Laboratory o Bacteria identified in Wound by Culture (MICRO) oooo LOINC Code: [id number] oooo Convenience Name: Wound culture routine  Electronic Signature(s) TAMILYN, OBERBROECKLING ([id number]) Signed: 07/14/2019 4:22:21 PM By: Montey Hora Signed: 07/15/2019 9:08:00 AM By: Worthy Keeler PA-C Entered By: Montey Hora on 07/14/2019 08:57:55 Christina Blackburn ([id number]) -------------------------------------------------------------------------------- Problem List Details Patient Name: Christina Blackburn Date of Service: 07/14/2019 8:30 AM Medical Record Number: [id number] Patient Account Number: [id number] Date of Birth/Sex: Feb 15, 1946 (73 y.o. F) Treating RN: Montey Hora Primary Care Provider: [name] Other Clinician: Referring Provider: [name] Treating Provider/Extender: Melburn Hake, HOYT Weeks in Treatment: 3 Active Problems ICD-10 Evaluated Encounter Code Description Active Date Today Diagnosis E11.622 Type 2 diabetes mellitus with other skin ulcer 06/19/2019 No Yes L97.828 Non-pressure chronic ulcer of other part of left lower leg with 06/19/2019 No Yes other specified severity I73.89 Other specified peripheral vascular diseases 06/19/2019 No Yes Inactive Problems Resolved Problems Electronic Signature(s) Signed: 07/14/2019 8:28:03 AM By: Worthy Keeler PA-C Entered By: Worthy Keeler on 07/14/2019 08:28:03 Christina Blackburn ([id number]) -------------------------------------------------------------------------------- Progress Note Details Patient Name: Christina Blackburn Date of Service: 07/14/2019 8:30 AM Medical Record Number: [id number] Patient Account Number: [id number] Date of Birth/Sex: 1946-04-19 (73 y.o. F) Treating RN: Montey Hora Primary Care Provider: [name] Other Clinician: Referring Provider: [name] Treating Provider/Extender: Melburn Hake, HOYT Weeks in  Treatment: 3 Subjective Chief Complaint Information obtained from Patient Left LE Ulcer History of Present Illness (HPI) 06/19/2019 on evaluation today patient presents today for an initial evaluation in our clinic regarding a problem that first began in June 2020. She states that she saw a red tender spot on her lower extremity. Subsequently she saw dermatology towards the beginning to middle of July. Following this time she actually ended up going to see her primary care provider on July 31 at which point she was given an antibiotic cream she states. On August 28 she actually ended up going to urgent care because this was getting worse and was placed on doxycycline at that time. This was the first round of doxycycline. On 06/17/2019 she was actually seen in the emergency department at [facility] where she was given IV vancomycin and recommended to continue with Augmentin as well as doxycycline on an outpatient basis following. She did have an x-ray performed at [facility] according to what she tells me today and this revealed that there was no signs of bone involvement with regard to infection. This obviously is excellent news. Right now she tells me she still having discomfort. Unfortunately she also has significant issues with peripheral vascular disease which is also somewhat unfortunate. Especially in light of the fact that the wound is obviously on her lower extremity. She actually has an appointment to see vascular at [facility] for second opinion as well with regard to this that is not till sometime around the 21st of this month September 2020. The patient does have a history of diabetes mellitus type 2 along with again peripheral vascular disease. Her most recent TBI's revealed that her ABIs were noncompressible bilaterally with a right TBI of 0.35 and a left TBI of 0.23 obviously she does have fairly significant peripheral vascular disease in my opinion. This is post intervention including stent  placement. 06/30/19 on evaluation today patient appears to be doing fairly well in regard to her wound on the left posterior lower extremity. She has been tolerating the dressing changes without complication which is good news. Fortunately there's no signs of active infection. She actually does have an appointment with vascular surgery [facility] later this month. 07/14/2019 on evaluation today actually did  see the patient back for reevaluation. Unfortunately the wound actually appears to be getting larger not better with everything that we have been trying up to this point. She did see vascular at [facility] last week. They unfortunately stated that they did not fill in their opinion that the patient would benefit from revascularization attempts as far as getting this wound to heal. It was noted in the note that if she is not healing appropriately and has any worsening of the wound that she may come back for revascularization at that point but they were not confident that that would make a difference in the wound healing. Nonetheless they did tell her that they felt like the wound would be able to heal but just would take a very long time. The patient obviously is somewhat frustrated at this point which I completely understand. The wound also is measuring larger today compared to last week. Patient History Information obtained from Patient. Family History Cancer - Siblings,Mother, Hypertension - Father, Kidney Disease - Father, Lung Disease - Father, Stroke - Maternal Grandparents, No family history of Diabetes, Heart Disease, Hereditary Spherocytosis, Seizures, Thyroid Problems, Tuberculosis. Social History Former smoker - ended on 10/15/2006, Marital Status - Married, Alcohol Use - Never, Drug Use - No History, Caffeine Use - Daily. Christina Blackburn ([id number]) Medical History Eyes Denies history of Cataracts, Glaucoma, Optic Neuritis Ear/Nose/Mouth/Throat Denies history of Chronic sinus  problems/congestion, Middle ear problems Hematologic/Lymphatic Denies history of Anemia, Hemophilia, Human Immunodeficiency Virus, Lymphedema, Sickle Cell Disease Respiratory Patient has history of Chronic Obstructive Pulmonary Disease (COPD) Denies history of Aspiration, Asthma, Pneumothorax, Sleep Apnea, Tuberculosis Cardiovascular Patient has history of Coronary Artery Disease - Stent placed 2009, Hypertension Denies history of Angina, Arrhythmia, Congestive Heart Failure, Deep Vein Thrombosis, Hypotension, Myocardial Infarction, Peripheral Arterial Disease, Peripheral Venous Disease, Phlebitis, Vasculitis Gastrointestinal Denies history of Cirrhosis , Colitis, Crohn s, Hepatitis A, Hepatitis B, Hepatitis C Endocrine Patient has history of Type II Diabetes - 2004 Denies history of Type I Diabetes Genitourinary Denies history of End Stage Renal Disease Immunological Denies history of Lupus Erythematosus, Raynaud s, Scleroderma Integumentary (Skin) Denies history of History of Burn, History of pressure wounds Musculoskeletal Denies history of Gout, Rheumatoid Arthritis, Osteoarthritis, Osteomyelitis Neurologic Denies history of Dementia, Neuropathy, Quadriplegia, Paraplegia, Seizure Disorder Oncologic Denies history of Received Chemotherapy, Received Radiation Psychiatric Denies history of Anorexia/bulimia, Confinement Anxiety Hospitalization/Surgery History - Stent placed. - gall bladder. - hysterectomy. - c-section x2. - ARM 2 stents placed 10/2018. Review of Systems (ROS) Constitutional Symptoms (General Health) Denies complaints or symptoms of Fatigue, Fever, Chills, Marked Weight Change. Respiratory Denies complaints or symptoms of Chronic or frequent coughs, Shortness of Breath. Cardiovascular Denies complaints or symptoms of Chest pain, LE edema. Psychiatric Denies complaints or symptoms of Anxiety, Claustrophobia. Objective Constitutional Well-nourished and  well-hydrated in no acute distress. Christina Blackburn ([id number]) Vitals Time Taken: 8:25 AM, Height: 59 in, Weight: 170 lbs, BMI: 34.3, Temperature: 98.7 F, Pulse: 86 bpm, Respiratory Rate: 18 breaths/min, Blood Pressure: 131/58 mmHg. Respiratory normal breathing without difficulty. clear to auscultation bilaterally. Cardiovascular regular rate and rhythm with normal S1, S2. Psychiatric this patient is able to make decisions and demonstrates good insight into disease process. Alert and Oriented x 3. pleasant and cooperative. General Notes: His wound bed currently showed signs of necrotic tissue still on the surface of the wound it really does not appear to be cleaning up as well as I would like to see. Fortunately there is no evidence of active infection  obvious although there is some erythema surrounding I believe is just more due to inflammation and not necessarily infection although I did obtain a wound culture today to evaluate for the possibility of infection as this could be one reason why the wound is not progressing and in fact is digressing. Nonetheless I did not place her on any antibiotics currently until I see the results of this culture I really do not want to put her on anything unnecessarily. She really does not have any edema of the leg I do not believe this is venous in nature I really feel like this is more likely arterial based on what I am seeing. Integumentary (Hair, Skin) Wound #4 status is Open. Original cause of wound was Gradually Appeared. The wound is located on the Left,Posterior Lower Leg. The wound measures 2.8cm length x 3.5cm width x 0.1cm depth; 7.697cm^2 area and 0.77cm^3 volume. There is Fat Layer (Subcutaneous Tissue) Exposed exposed. There is no tunneling or undermining noted. There is a medium amount of serous drainage noted. The wound margin is flat and intact. There is small (1-33%) pink granulation within the wound bed. There is a large (67-100%)  amount of necrotic tissue within the wound bed including Eschar and Adherent Slough. Assessment Active Problems ICD-10 Type 2 diabetes mellitus with other skin ulcer Non-pressure chronic ulcer of other part of left lower leg with other specified severity Other specified peripheral vascular diseases Plan Wound Cleansing: Wound #4 Left,Posterior Lower Leg: Clean wound with Normal Saline. Dial antibacterial soap, wash wounds, rinse and pat dry prior to dressing wounds May Shower, gently pat wound dry prior to applying new dressing. Primary Wound Dressing: Wound #4 Left,Posterior Lower Leg: JANIYAH, HARPER ([id number]) Iodoflex Secondary Dressing: Wound #4 Left,Posterior Lower Leg: Boardered Foam Dressing Dressing Change Frequency: Wound #4 Left,Posterior Lower Leg: Change dressing every other day. Follow-up Appointments: Wound #4 Left,Posterior Lower Leg: Return Appointment in 1 week. Medications-please add to medication list.: Wound #4 Left,Posterior Lower Leg: Other: - Americaine 20% benzocaine spray Laboratory ordered were: Wound culture routine 1. I would recommend that we go ahead and switch up the dressing to Iodoflex based on what I am seeing today I am afraid that the Santyl may be too moist for her although it has helped soften up the eschar unfortunately the wound is just not improving. 2. I did obtain a wound culture that all sent for evaluation as well and we will see what the results of this culture showed a pinhole that I may initiate therapy with antibiotics. 3. I am also going to suggest that she continue to monitor for anything worsening if she has any issues she contact the office and let me know. 4. I explained to her that if she is not doing much better by next week at least in the appearance of the wound or showing any signs of improvement that I would likely recommend that we either have her go back to see vascular or potentially get a second opinion  from another vascular surgeon which way we go is definitely up to her we will see what she thinks at that point depending on how things are progressing. We will see patient back for reevaluation in 1 week here in the clinic. If anything worsens or changes patient will contact our office for additional recommendations. Electronic Signature(s) Signed: 07/14/2019 9:03:48 AM By: Worthy Keeler PA-C Entered By: Worthy Keeler on 07/14/2019 09:03:47 Christina Blackburn ([id number]) -------------------------------------------------------------------------------- ROS/PFSH Details Patient Name: Lanette Hampshire  G. Date of Service: 07/14/2019 8:30 AM Medical Record Number: [id number] Patient Account Number: [id number] Date of Birth/Sex: 01-Feb-1946 (73 y.o. F) Treating RN: Montey Hora Primary Care Provider: [name] Other Clinician: Referring Provider: [name] Treating Provider/Extender: Melburn Hake, HOYT Weeks in Treatment: 3 Information Obtained From Patient Constitutional Symptoms (General Health) Complaints and Symptoms: Negative for: Fatigue; Fever; Chills; Marked Weight Change Respiratory Complaints and Symptoms: Negative for: Chronic or frequent coughs; Shortness of Breath Medical History: Positive for: Chronic Obstructive Pulmonary Disease (COPD) Negative for: Aspiration; Asthma; Pneumothorax; Sleep Apnea; Tuberculosis Cardiovascular Complaints and Symptoms: Negative for: Chest pain; LE edema Medical History: Positive for: Coronary Artery Disease - Stent placed 2009; Hypertension Negative for: Angina; Arrhythmia; Congestive Heart Failure; Deep Vein Thrombosis; Hypotension; Myocardial Infarction; Peripheral Arterial Disease; Peripheral Venous Disease; Phlebitis; Vasculitis Psychiatric Complaints and Symptoms: Negative for: Anxiety; Claustrophobia Medical History: Negative for: Anorexia/bulimia; Confinement Anxiety Eyes Medical History: Negative for: Cataracts;  Glaucoma; Optic Neuritis Ear/Nose/Mouth/Throat Medical History: Negative for: Chronic sinus problems/congestion; Middle ear problems Hematologic/Lymphatic Medical History: Negative for: Anemia; Hemophilia; Human Immunodeficiency Virus; Lymphedema; Sickle Cell Disease FLORITA, RIGHTMYER ([id number]) Gastrointestinal Medical History: Negative for: Cirrhosis ; Colitis; Crohnos; Hepatitis A; Hepatitis B; Hepatitis C Endocrine Medical History: Positive for: Type II Diabetes - 2004 Negative for: Type I Diabetes Time with diabetes: 15 years Treated with: Oral agents Blood sugar tested every day: Yes Tested : 2-3 times daily Blood sugar testing results: Breakfast: 84 Genitourinary Medical History: Negative for: End Stage Renal Disease Immunological Medical History: Negative for: Lupus Erythematosus; Raynaudos; Scleroderma Integumentary (Skin) Medical History: Negative for: History of Burn; History of pressure wounds Musculoskeletal Medical History: Negative for: Gout; Rheumatoid Arthritis; Osteoarthritis; Osteomyelitis Neurologic Medical History: Negative for: Dementia; Neuropathy; Quadriplegia; Paraplegia; Seizure Disorder Oncologic Medical History: Negative for: Received Chemotherapy; Received Radiation Immunizations Pneumococcal Vaccine: Received Pneumococcal Vaccination: Yes Tetanus Vaccine: Last tetanus shot: 06/19/2013 Implantable Devices None Hospitalization / Surgery History Type of Hospitalization/Surgery Stent placed WLADYSLAWA, KERNER ([id number]) gall bladder hysterectomy c-section x2 ARM 2 stents placed 10/2018 Family and Social History Cancer: Yes - Siblings,Mother; Diabetes: No; Heart Disease: No; Hereditary Spherocytosis: No; Hypertension: Yes - Father; Kidney Disease: Yes - Father; Lung Disease: Yes - Father; Seizures: No; Stroke: Yes - Maternal Grandparents; Thyroid Problems: No; Tuberculosis: No; Former smoker - ended on 10/15/2006; Marital Status -  Married; Alcohol Use: Never; Drug Use: No History; Caffeine Use: Daily; Financial Concerns: No; Food, Clothing or Shelter Needs: No; Support System Lacking: No; Transportation Concerns: No Physician Affirmation I have reviewed and agree with the above information. Electronic Signature(s) Signed: 07/14/2019 4:22:21 PM By: Montey Hora Signed: 07/15/2019 9:08:00 AM By: Worthy Keeler PA-C Entered By: Worthy Keeler on 07/14/2019 09:02:15 Christina Blackburn ([id number]) -------------------------------------------------------------------------------- SuperBill Details Patient Name: Christina Blackburn Date of Service: 07/14/2019 Medical Record Number: [id number] Patient Account Number: [id number] Date of Birth/Sex: [birth year] (73 y.o. 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 Other specified peripheral vascular diseases Facility Procedures CPT4 Code: [id number] Description: 99213 - WOUND CARE VISIT-LEV 3 EST PT Modifier: Quantity: 1 Physician Procedures CPT4 Code Description: [id number] - WC PHYS LEVEL 4 - EST PT ICD-10 Diagnosis Description E11.622 Type 2 diabetes mellitus with other skin ulcer L97.828  Non-pressure chronic ulcer of other part of left lower leg with 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

## 2019-07-14 NOTE — Progress Notes (Signed)
Christina Blackburn, Christina Blackburn ([id number]) Visit Report for 07/14/2019 Arrival Information Details Patient Name: Christina Blackburn, Christina Blackburn Date of Service: 07/14/2019 8:30 AM Medical Record Number: [id number] Patient Account Number: [id number] Date of Birth/Sex: Nov 26, 1945 (73 y.o. F) Treating RN: Harold Barban Primary Care Simi Briel: [name] Other Clinician: Referring Skylynn Burkley: [name] Treating Emersen Mascari/Extender: Melburn Hake, HOYT Weeks in Treatment: 3 Visit Information History Since Last Visit Added or deleted any medications: No Patient Arrived: Ambulatory Any new allergies or adverse reactions: No Arrival Time: 08:27 Had a fall or experienced change in No Accompanied By: self activities of daily living that may affect Transfer Assistance: None risk of falls: Patient Identification Verified: Yes Signs or symptoms of abuse/neglect since last visito No Secondary Verification Process Yes Hospitalized since last visit: No Completed: Has Dressing in Place as Prescribed: Yes Patient Has Alerts: Yes Pain Present Now: Yes Patient Alerts: DMII ABI [place] BILATERAL >220 03/23/19 TBI L .23 R .35 Electronic Signature(s) Signed: 07/14/2019 3:56:44 PM By: Harold Barban Entered By: Harold Barban on 07/14/2019 08:27:47 Christina Blackburn ([id number]) -------------------------------------------------------------------------------- Clinic Level of Care Assessment Details Patient Name: Christina Blackburn Date of Service: 07/14/2019 8:30 AM Medical Record Number: [id number] Patient Account Number: [id number] Date of Birth/Sex: [birth year] (73 y.o. F) Treating RN: Montey Hora Primary Care Merwin Breden: [name] Other Clinician: Referring Shemekia Patane: [name] Treating Demarea Lorey/Extender: Melburn Hake, HOYT Weeks in Treatment: 3 Clinic Level of Care Assessment Items TOOL 4 Quantity Score []  - Use when only an EandM is performed on FOLLOW-UP visit 0 ASSESSMENTS - Nursing Assessment /  Reassessment X - Reassessment of Co-morbidities (includes updates in patient status) 1 10 X- 1 5 Reassessment of Adherence to Treatment Plan ASSESSMENTS - Wound and Skin Assessment / Reassessment X - Simple Wound Assessment / Reassessment - one wound 1 5 []  - 0 Complex Wound Assessment / Reassessment - multiple wounds []  - 0 Dermatologic / Skin Assessment (not related to wound area) ASSESSMENTS - Focused Assessment []  - Circumferential Edema Measurements - multi extremities 0 []  - 0 Nutritional Assessment / Counseling / Intervention X- 1 5 Lower Extremity Assessment (monofilament, tuning fork, pulses) []  - 0 Peripheral Arterial Disease Assessment (using hand held doppler) ASSESSMENTS - Ostomy and/or Continence Assessment and Care []  - Incontinence Assessment and Management 0 []  - 0 Ostomy Care Assessment and Management (repouching, etc.) PROCESS - Coordination of Care X - Simple Patient / Family Education for ongoing care 1 15 []  - 0 Complex (extensive) Patient / Family Education for ongoing care X- 1 10 Staff obtains Programmer, systems, Records, Test Results / Process Orders []  - 0 Staff telephones HHA, Nursing Homes / Clarify orders / etc []  - 0 Routine Transfer to another Facility (non-emergent condition) []  - 0 Routine Hospital Admission (non-emergent condition) []  - 0 New Admissions / Biomedical engineer / Ordering NPWT, Apligraf, etc. []  - 0 Emergency Hospital Admission (emergent condition) X- 1 10 Simple Discharge Coordination Christina Blackburn, Christina Blackburn ([id number]) []  - 0 Complex (extensive) Discharge Coordination PROCESS - Special Needs []  - Pediatric / Minor Patient Management 0 []  - 0 Isolation Patient Management []  - 0 Hearing / Language / Visual special needs []  - 0 Assessment of Community assistance (transportation, D/C planning, etc.) []  - 0 Additional assistance / Altered mentation []  - 0 Support Surface(s) Assessment (bed, cushion, seat, etc.) INTERVENTIONS  - Wound Cleansing / Measurement X - Simple Wound Cleansing - one wound 1 5 []  - 0 Complex Wound Cleansing - multiple wounds X- 1 5 Wound Imaging (photographs -  any number of wounds) []  - 0 Wound Tracing (instead of photographs) X- 1 5 Simple Wound Measurement - one wound []  - 0 Complex Wound Measurement - multiple wounds INTERVENTIONS - Wound Dressings X - Small Wound Dressing one or multiple wounds 1 10 []  - 0 Medium Wound Dressing one or multiple wounds []  - 0 Large Wound Dressing one or multiple wounds []  - 0 Application of Medications - topical []  - 0 Application of Medications - injection INTERVENTIONS - Miscellaneous []  - External ear exam 0 X- 1 5 Specimen Collection (cultures, biopsies, blood, body fluids, etc.) X- 1 5 Specimen(s) / Culture(s) sent or taken to Lab for analysis []  - 0 Patient Transfer (multiple staff / Civil Service fast streamer / Similar devices) []  - 0 Simple Staple / Suture removal (25 or less) []  - 0 Complex Staple / Suture removal (26 or more) []  - 0 Hypo / Hyperglycemic Management (close monitor of Blood Glucose) []  - 0 Ankle / Brachial Index (ABI) - do not check if billed separately X- 1 5 Vital Signs Christina Blackburn, Christina Blackburn ([id number]) Has the patient been seen at the hospital within the last three years: Yes Total Score: 100 Level Of Care: New/Established - Level 3 Electronic Signature(s) Signed: 07/14/2019 4:22:21 PM By: Montey Hora Entered By: Montey Hora on 07/14/2019 08:59:36 Christina Blackburn ([id number]) -------------------------------------------------------------------------------- Encounter Discharge Information Details Patient Name: Christina Blackburn Date of Service: 07/14/2019 8:30 AM Medical Record Number: [id number] Patient Account Number: [id number] Date of Birth/Sex: [birth year] (72 y.o. F) Treating RN: Montey Hora Primary Care Leodis Alcocer: [name] Other Clinician: Referring Analyn Matusek: [name] Treating  Cylus Douville/Extender: Melburn Hake, HOYT Weeks in Treatment: 3 Encounter Discharge Information Items Discharge Condition: Stable Ambulatory Status: Ambulatory Discharge Destination: Home Transportation: Private Auto Accompanied By: self Schedule Follow-up Appointment: Yes Clinical Summary of Care: Electronic Signature(s) Signed: 07/14/2019 4:22:21 PM By: Montey Hora Entered By: Montey Hora on 07/14/2019 09:00:30 Christina Blackburn ([id number]) -------------------------------------------------------------------------------- Lower Extremity Assessment Details Patient Name: Christina Blackburn Date of Service: 07/14/2019 8:30 AM Medical Record Number: [id number] Patient Account Number: [id number] Date of Birth/Sex: January 02, 1946 (73 y.o. F) Treating RN: Harold Barban Primary Care Shlonda Dolloff: [name] Other Clinician: Referring Tellis Spivak: [name] Treating Issak Goley/Extender: Melburn Hake, HOYT Weeks in Treatment: 3 Edema Assessment Assessed: [Left: No] [Right: No] Edema: [Left: N] [Right: o] Vascular Assessment Pulses: Dorsalis Pedis Palpable: [Left:Yes] Electronic Signature(s) Signed: 07/14/2019 3:56:44 PM By: Harold Barban Entered By: Harold Barban on 07/14/2019 08:31:53 Christina Blackburn ([id number]) -------------------------------------------------------------------------------- Multi Wound Chart Details Patient Name: Christina Blackburn Date of Service: 07/14/2019 8:30 AM Medical Record Number: [id number] Patient Account Number: [id number] Date of Birth/Sex: 05-Dec-1945 (73 y.o. F) Treating RN: Montey Hora Primary Care Kenyetta Fife: [name] Other Clinician: Referring Shriley Joffe: [name] Treating Horace Wishon/Extender: Melburn Hake, HOYT Weeks in Treatment: 3 Vital Signs Height(in): 59 Pulse(bpm): 89 Weight(lbs): 170 Blood Pressure(mmHg): 131/58 Body Mass Index(BMI): 34 Temperature(F): 98.7 Respiratory Rate 18 (breaths/min): Photos: [N/A:N/A] Wound  Location: Left Lower Leg - Posterior N/A N/A Wounding Event: Gradually Appeared N/A N/A Primary Etiology: Diabetic Wound/Ulcer of the N/A N/A Lower Extremity Secondary Etiology: Arterial Insufficiency Ulcer N/A N/A Comorbid History: Chronic Obstructive N/A N/A Pulmonary Disease (COPD), Coronary Artery Disease, Hypertension, Type II Diabetes Date Acquired: 03/16/2019 N/A N/A Weeks of Treatment: 3 N/A N/A Wound Status: Open N/A N/A Measurements L x W x D 2.8x3.5x0.1 N/A N/A (cm) Area (cm) : 7.697 N/A N/A Volume (cm) : 0.77 N/A N/A % Reduction in Area: -284.30% N/A N/A % Reduction  in Volume: -285.00% N/A N/A Classification: Grade 2 N/A N/A Exudate Amount: Medium N/A N/A Exudate Type: Serous N/A N/A Exudate Color: amber N/A N/A Wound Margin: Flat and Intact N/A N/A Granulation Amount: Small (1-33%) N/A N/A Granulation Quality: Pink N/A N/A Necrotic Amount: Large (67-100%) N/A N/A Necrotic Tissue: Eschar, Adherent Slough N/A N/A Exposed Structures: Fat Layer (Subcutaneous N/A N/A Tissue) Exposed: Yes Christina Blackburn, Christina Blackburn ([id number]) Fascia: No Tendon: No Muscle: No Joint: No Bone: No Epithelialization: None N/A N/A Treatment Notes Electronic Signature(s) Signed: 07/14/2019 4:22:21 PM By: Montey Hora Entered By: Montey Hora on 07/14/2019 08:59:42 Christina Blackburn ([id number]) -------------------------------------------------------------------------------- [place] Details Patient Name: Christina Blackburn Date of Service: 07/14/2019 8:30 AM Medical Record Number: [id number] Patient Account Number: [id number] Date of Birth/Sex: October 25, 1945 (73 y.o. F) Treating RN: Montey Hora Primary Care Shandee Jergens: [name] Other Clinician: Referring Andilynn Delavega: [name] Treating Doranne Schmutz/Extender: Melburn Hake, HOYT Weeks in Treatment: 3 Active Inactive Abuse / Safety / Falls / Self Care Management Nursing Diagnoses: Potential for  falls Goals: Patient will remain injury free related to falls Date Initiated: 06/19/2019 Target Resolution Date: 09/19/2019 Goal Status: Active Interventions: Assess fall risk on admission and as needed Notes: Necrotic Tissue Nursing Diagnoses: Impaired tissue integrity related to necrotic/devitalized tissue Goals: Necrotic/devitalized tissue will be minimized in the wound bed Date Initiated: 06/19/2019 Target Resolution Date: 09/19/2019 Goal Status: Active Interventions: Provide education on necrotic tissue and debridement process Notes: Orientation to the Wound Care Program Nursing Diagnoses: Knowledge deficit related to the wound healing center program Goals: Patient/caregiver will verbalize understanding of the [facility] Program Date Initiated: 06/19/2019 Target Resolution Date: 09/19/2019 Goal Status: Active Interventions: Provide education on orientation to the wound center Christina Blackburn ([id number]) Notes: Pain, Acute or Chronic Nursing Diagnoses: Pain, acute or chronic: actual or potential Goals: Patient/caregiver will verbalize comfort level met Date Initiated: 06/19/2019 Target Resolution Date: 09/19/2019 Goal Status: Active Interventions: Complete pain assessment as per visit requirements Notes: Wound/Skin Impairment Nursing Diagnoses: Impaired tissue integrity Goals: Ulcer/skin breakdown will heal within 14 weeks Date Initiated: 06/19/2019 Target Resolution Date: 09/19/2019 Goal Status: Active Interventions: Assess patient/caregiver ability to obtain necessary supplies Assess patient/caregiver ability to perform ulcer/skin care regimen upon admission and as needed Assess ulceration(s) every visit Notes: Electronic Signature(s) Signed: 07/14/2019 4:22:21 PM By: Montey Hora Entered By: Montey Hora on 07/14/2019 08:57:09 Christina Blackburn ([id number]) -------------------------------------------------------------------------------- Pain  Assessment Details Patient Name: Christina Blackburn Date of Service: 07/14/2019 8:30 AM Medical Record Number: [id number] Patient Account Number: [id number] Date of Birth/Sex: Feb 23, 1946 (73 y.o. F) Treating RN: Harold Barban Primary Care [name] Driscoll: [name] Other Clinician: Referring Khamia Stambaugh: [name] Treating [place] Acklin/Extender: Melburn Hake, HOYT Weeks in Treatment: 3 Active Problems Location of Pain Severity and Description of Pain Patient Has Paino Yes Site Locations Rate the pain. Current Pain Level: 7 Pain Management and Medication Current Pain Management: Electronic Signature(s) Signed: 07/14/2019 3:56:44 PM By: Harold Barban Entered By: Harold Barban on 07/14/2019 08:28:00 Christina Blackburn ([id number]) -------------------------------------------------------------------------------- Patient/Caregiver Education Details Patient Name: Christina Blackburn Date of Service: 07/14/2019 8:30 AM Medical Record Number: [id number] Patient Account Number: [id number] Date of Birth/Gender: [birth year] (73 y.o. F) Treating RN: Montey Hora Primary Care Physician: [name] Other Clinician: Referring Physician: [name] Treating Physician/Extender: Sharalyn Ink in Treatment: 3 Education Assessment Education Provided To: Patient Education Topics Provided Wound/Skin Impairment: Handouts: Other: wound care as ordered Methods: Demonstration, Explain/Verbal Responses: State content correctly Electronic Signature(s) Signed: 07/14/2019 4:22:21 PM By:  Dorthy, Di Kindle Entered By: Montey Hora on 07/14/2019 08:59:58 Christina Blackburn ([id number]) -------------------------------------------------------------------------------- Wound Assessment Details Patient Name: Christina Blackburn Date of Service: 07/14/2019 8:30 AM Medical Record Number: [id number] Patient Account Number: [id number] Date of Birth/Sex: [birth year] (73 y.o. F) Treating RN: Harold Barban Primary Care Decklyn Hyder: [name] Other Clinician: Referring Jodelle Fausto: [name] Treating Sonna Lipsky/Extender: Melburn Hake, HOYT Weeks in Treatment: 3 Wound Status Wound Number: 4 Primary Diabetic Wound/Ulcer of the Lower Extremity Etiology: Wound Location: Left Lower Leg - Posterior Secondary Arterial Insufficiency Ulcer Wounding Event: Gradually Appeared Etiology: Date Acquired: 03/16/2019 Wound Open Weeks Of Treatment: 3 Status: Clustered Wound: No Comorbid Chronic Obstructive Pulmonary Disease History: (COPD), Coronary Artery Disease, Hypertension, Type II Diabetes Photos Wound Measurements Length: (cm) 2.8 % Reduction Width: (cm) 3.5 % Reduction Depth: (cm) 0.1 Epitheliali Area: (cm) 7.697 Tunneling: Volume: (cm) 0.77 Underminin in Area: -284.3% in Volume: -285% zation: None No g: No Wound Description Classification: Grade 2 Foul Odor A Wound Margin: Flat and Intact Slough/Fibr Exudate Amount: Medium Exudate Type: Serous Exudate Color: amber fter Cleansing: No ino Yes Wound Bed Granulation Amount: Small (1-33%) Exposed Structure Granulation Quality: Pink Fascia Exposed: No Necrotic Amount: Large (67-100%) Fat Layer (Subcutaneous Tissue) Exposed: Yes Necrotic Quality: Eschar, Adherent Slough Tendon Exposed: No Muscle Exposed: No Joint Exposed: No Bone Exposed: No Christina Blackburn, Christina Blackburn ([id number]) Treatment Notes Wound #4 (Left, Posterior Lower Leg) Notes iodoflex, BFD Electronic Signature(s) Signed: 07/14/2019 3:56:44 PM By: Harold Barban Entered By: Harold Barban on 07/14/2019 08:31:36 Christina Blackburn ([id number]) -------------------------------------------------------------------------------- Vitals Details Patient Name: Christina Blackburn Date of Service: 07/14/2019 8:30 AM Medical Record Number: [id number] Patient Account Number: [id number] Date of Birth/Sex: 02-16-1946 (73 y.o. F) Treating RN: Harold Barban Primary Care  Dajion Bickford: [name] Other Clinician: Referring Payton Prinsen: [name] Treating Axiel Fjeld/Extender: Melburn Hake, HOYT Weeks in Treatment: 3 Vital Signs Time Taken: 08:25 Temperature (F): 98.7 Height (in): 59 Pulse (bpm): 86 Weight (lbs): 170 Respiratory Rate (breaths/min): 18 Body Mass Index (BMI): 34.3 Blood Pressure (mmHg): 131/58 Reference Range: 80 - 120 mg / dl Electronic Signature(s) Signed: 07/14/2019 3:56:44 PM By: Harold Barban Entered By: Harold Barban on 07/14/2019 08:28:30

## 2019-07-15 ENCOUNTER — Other Ambulatory Visit
Admission: RE | Admit: 2019-07-15 | Discharge: 2019-07-15 | Disposition: A | Discharge status: Home / Self Care | Payer: PPO | Source: Ambulatory Visit | Attending: Physician Assistant | Admitting: Physician Assistant

## 2019-07-15 DIAGNOSIS — L03116 Cellulitis of left lower limb: Secondary | ICD-10-CM | POA: Insufficient documentation

## 2019-07-16 DIAGNOSIS — E11621 Type 2 diabetes mellitus with foot ulcer: Secondary | ICD-10-CM | POA: Diagnosis not present

## 2019-07-18 LAB — AEROBIC CULTURE W GRAM STAIN (SUPERFICIAL SPECIMEN)

## 2019-07-18 LAB — AEROBIC CULTURE? (SUPERFICIAL SPECIMEN): Gram Stain: NONE SEEN

## 2019-07-21 ENCOUNTER — Encounter: Payer: PPO | Attending: Physician Assistant | Admitting: Physician Assistant

## 2019-07-21 ENCOUNTER — Other Ambulatory Visit: Payer: Self-pay

## 2019-07-21 DIAGNOSIS — Z888 Allergy status to other drugs, medicaments and biological substances status: Secondary | ICD-10-CM | POA: Insufficient documentation

## 2019-07-21 DIAGNOSIS — Z841 Family history of disorders of kidney and ureter: Secondary | ICD-10-CM | POA: Diagnosis not present

## 2019-07-21 DIAGNOSIS — J449 Chronic obstructive pulmonary disease, unspecified: Secondary | ICD-10-CM | POA: Diagnosis not present

## 2019-07-21 DIAGNOSIS — Z823 Family history of stroke: Secondary | ICD-10-CM | POA: Diagnosis not present

## 2019-07-21 DIAGNOSIS — Z8249 Family history of ischemic heart disease and other diseases of the circulatory system: Secondary | ICD-10-CM | POA: Insufficient documentation

## 2019-07-21 DIAGNOSIS — Z809 Family history of malignant neoplasm, unspecified: Secondary | ICD-10-CM | POA: Insufficient documentation

## 2019-07-21 DIAGNOSIS — I10 Essential (primary) hypertension: Secondary | ICD-10-CM | POA: Diagnosis not present

## 2019-07-21 DIAGNOSIS — I2510 Atherosclerotic heart disease of native coronary artery without angina pectoris: Secondary | ICD-10-CM | POA: Diagnosis not present

## 2019-07-21 DIAGNOSIS — E11622 Type 2 diabetes mellitus with other skin ulcer: Secondary | ICD-10-CM | POA: Diagnosis not present

## 2019-07-21 DIAGNOSIS — L97829 Non-pressure chronic ulcer of other part of left lower leg with unspecified severity: Secondary | ICD-10-CM | POA: Insufficient documentation

## 2019-07-21 DIAGNOSIS — Z955 Presence of coronary angioplasty implant and graft: Secondary | ICD-10-CM | POA: Insufficient documentation

## 2019-07-21 DIAGNOSIS — Z836 Family history of other diseases of the respiratory system: Secondary | ICD-10-CM | POA: Diagnosis not present

## 2019-07-21 DIAGNOSIS — B9689 Other specified bacterial agents as the cause of diseases classified elsewhere: Secondary | ICD-10-CM | POA: Diagnosis not present

## 2019-07-21 DIAGNOSIS — L299 Pruritus, unspecified: Secondary | ICD-10-CM | POA: Diagnosis not present

## 2019-07-21 DIAGNOSIS — Z87891 Personal history of nicotine dependence: Secondary | ICD-10-CM | POA: Insufficient documentation

## 2019-07-21 DIAGNOSIS — Z881 Allergy status to other antibiotic agents status: Secondary | ICD-10-CM | POA: Insufficient documentation

## 2019-07-21 DIAGNOSIS — I739 Peripheral vascular disease, unspecified: Secondary | ICD-10-CM | POA: Diagnosis not present

## 2019-07-21 DIAGNOSIS — E1151 Type 2 diabetes mellitus with diabetic peripheral angiopathy without gangrene: Secondary | ICD-10-CM | POA: Insufficient documentation

## 2019-07-21 DIAGNOSIS — L97222 Non-pressure chronic ulcer of left calf with fat layer exposed: Secondary | ICD-10-CM | POA: Diagnosis not present

## 2019-07-21 DIAGNOSIS — E11621 Type 2 diabetes mellitus with foot ulcer: Secondary | ICD-10-CM | POA: Diagnosis not present

## 2019-07-21 NOTE — Progress Notes (Signed)
LAQUANDRA, CARRILLO ([id number]) Visit Report for 07/21/2019 Arrival Information Details Patient Name: Christina Blackburn, Christina Blackburn Date of Service: 07/21/2019 8:00 AM Medical Record Number: [id number] Patient Account Number: [id number] Date of Birth/Sex: Feb 15, 1946 (73 y.o. F) Treating RN: Cornell Barman Primary Care Kasson Lamere: [name] Other Clinician: Referring Loi Rennaker: [name] Treating Camran Keady/Extender: Melburn Hake, HOYT Weeks in Treatment: 4 Visit Information History Since Last Visit Added or deleted any medications: No Patient Arrived: Ambulatory Any new allergies or adverse reactions: No Arrival Time: 08:11 Had a fall or experienced change in No Accompanied By: self activities of daily living that may affect Transfer Assistance: None risk of falls: Patient Identification Verified: Yes Signs or symptoms of abuse/neglect since last visito No Secondary Verification Process Yes Hospitalized since last visit: No Completed: Implantable device outside of the clinic excluding No Patient Has Alerts: Yes cellular tissue based products placed in the center Patient Alerts: DMII since last visit: ABI [place] BILATERAL Has Dressing in Place as Prescribed: Yes >220 Pain Present Now: Yes 03/23/19 TBI L .23 R .35 Electronic Signature(s) Signed: 07/21/2019 5:14:16 PM By: Gretta Cool, BSN, RN, CWS, Kim RN, BSN Entered By: Gretta Cool, BSN, RN, CWS, Kim on 07/21/2019 08:12:03 Christina Blackburn ([id number]) -------------------------------------------------------------------------------- Encounter Discharge Information Details Patient Name: Christina Blackburn Date of Service: 07/21/2019 8:00 AM Medical Record Number: [id number] Patient Account Number: [id number] Date of Birth/Sex: September 26, 1946 (73 y.o. F) Treating RN: Montey Hora Primary Care Rilynn Habel: [name] Other Clinician: Referring Lendy Dittrich: [name] Treating Kendarrius Tanzi/Extender: Melburn Hake, HOYT Weeks in Treatment: 4 Encounter  Discharge Information Items Post Procedure Vitals Discharge Condition: Stable Temperature (F): 98.4 Ambulatory Status: Ambulatory Pulse (bpm): 68 Discharge Destination: Home Respiratory Rate (breaths/min): 16 Transportation: Private Auto Blood Pressure (mmHg): 154/77 Accompanied By: self Schedule Follow-up Appointment: Yes Clinical Summary of Care: Electronic Signature(s) Signed: 07/21/2019 4:54:06 PM By: Montey Hora Entered By: Montey Hora on 07/21/2019 08:35:01 Christina Blackburn ([id number]) -------------------------------------------------------------------------------- Lower Extremity Assessment Details Patient Name: Christina Blackburn Date of Service: 07/21/2019 8:00 AM Medical Record Number: [id number] Patient Account Number: [id number] Date of Birth/Sex: July 09, 1946 (73 y.o. F) Treating RN: Cornell Barman Primary Care Javeion Cannedy: [name] Other Clinician: Referring Shaynah Hund: [name] Treating Madolyn Ackroyd/Extender: Melburn Hake, HOYT Weeks in Treatment: 4 Vascular Assessment Pulses: Dorsalis Pedis Palpable: [Left:Yes] Electronic Signature(s) Signed: 07/21/2019 5:14:16 PM By: Gretta Cool, BSN, RN, CWS, Kim RN, BSN Entered By: Gretta Cool, BSN, RN, CWS, Kim on 07/21/2019 08:17:29 Christina Blackburn ([id number]) -------------------------------------------------------------------------------- Multi Wound Chart Details Patient Name: Christina Blackburn Date of Service: 07/21/2019 8:00 AM Medical Record Number: [id number] Patient Account Number: [id number] Date of Birth/Sex: 20-Jan-1946 (73 y.o. 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 N/A N/A Comorbid History: Chronic Obstructive N/A N/A Pulmonary Disease (COPD), Coronary Artery Disease, Hypertension, Type II Diabetes Date Acquired: 03/16/2019 N/A N/A Weeks of Treatment: 4 N/A N/A Wound Status: Open N/A N/A Measurements L x W x D 3.5x4x0.1 N/A N/A (cm) Area (cm) : 10.996 N/A N/A Volume (cm) : 1.1 N/A N/A % Reduction in Area: -449.00% N/A N/A % Reduction in Volume: -450.00% N/A N/A Classification: Grade 2  N/A N/A Exudate Amount: Medium N/A N/A Exudate Type: Serous N/A N/A Exudate Color: amber N/A N/A Wound Margin: Flat and Intact N/A N/A Granulation Amount: None Present (0%) N/A N/A Necrotic Amount: Large (67-100%) N/A N/A Necrotic Tissue: Eschar N/A N/A Exposed Structures: Fat Layer (Subcutaneous N/A N/A Tissue) Exposed: Yes Fascia: No NAVINA, WOHLERS ([id number]) Tendon: No Muscle: No Joint: No Bone: No Epithelialization: None N/A N/A Treatment Notes Electronic Signature(s) Signed: 07/21/2019 4:54:06 PM By: Montey Hora Entered By: Montey Hora on 07/21/2019 08:30:43 Christina Blackburn ([id number]) -------------------------------------------------------------------------------- [place] Details Patient Name: Christina Blackburn Date of Service: 07/21/2019 8:00 AM Medical Record Number: [id number] Patient Account Number: [id number] Date of Birth/Sex: Aug 16, 1946 (73 y.o. F) Treating RN: Montey Hora Primary Care Charmagne Buhl: [name] Other Clinician: Referring Landrey Mahurin: [name] Treating Kahmari Koller/Extender: Melburn Hake, HOYT Weeks in Treatment: 4 Active Inactive Abuse / Safety / Falls / Self Care Management Nursing Diagnoses: Potential for falls Goals: Patient will remain injury free  related to falls Date Initiated: 06/19/2019 Target Resolution Date: 09/19/2019 Goal Status: Active Interventions: Assess fall risk on admission and as needed Notes: Necrotic Tissue Nursing Diagnoses: Impaired tissue integrity related to necrotic/devitalized tissue Goals: Necrotic/devitalized tissue will be minimized in the wound bed Date Initiated: 06/19/2019 Target Resolution Date: 09/19/2019 Goal Status: Active Interventions: Provide education on necrotic tissue and debridement process Notes: Orientation to the Wound Care Program Nursing Diagnoses: Knowledge deficit related to the wound healing center program Goals: Patient/caregiver will verbalize understanding of the [place] Program Date Initiated: 06/19/2019 Target Resolution Date: 09/19/2019 Goal Status: Active Interventions: Provide education on orientation to the wound center Christina Blackburn ([id number]) Notes: Pain, Acute or Chronic Nursing Diagnoses: Pain, acute or chronic: actual or potential Goals: Patient/caregiver will verbalize comfort level met Date Initiated: 06/19/2019 Target Resolution Date: 09/19/2019 Goal Status: Active Interventions: Complete pain assessment as per visit requirements Notes: Wound/Skin Impairment Nursing Diagnoses: Impaired tissue integrity Goals: Ulcer/skin breakdown will heal within 14 weeks Date Initiated: 06/19/2019 Target Resolution Date: 09/19/2019 Goal Status: Active Interventions: Assess patient/caregiver ability to obtain necessary supplies Assess patient/caregiver ability to perform ulcer/skin care regimen upon admission and as needed Assess ulceration(s) every visit Notes: Electronic Signature(s) Signed: 07/21/2019 4:54:06 PM By: Montey Hora Entered By: Montey Hora on 07/21/2019 08:30:25 Christina Blackburn ([id number]) -------------------------------------------------------------------------------- Pain Assessment Details Patient Name: Christina Blackburn Date of Service: 07/21/2019 8:00 AM Medical Record Number: [id number] Patient Account Number: [id number] Date of Birth/Sex: [birth year] (73 y.o. F) Treating RN: Cornell Barman Primary Care Kasaundra Fahrney: [name] Other Clinician: Referring Yechiel Erny: [name] Treating Alanzo Lamb/Extender: Melburn Hake, HOYT Weeks in Treatment: 4 Active Problems Location of Pain Severity and Description of Pain Patient Has Paino Yes Site Locations Pain Location: Pain in Ulcers Duration of the Pain. Constant / Intermittento Constant Rate the pain. Current Pain Level: 5 Character of Pain Describe the Pain: Heavy, Sharp Pain Management and Medication Current Pain Management: Electronic Signature(s) Signed: 07/21/2019 5:14:16 PM By: Gretta Cool, BSN, RN, CWS, Kim RN, BSN Entered By: Gretta Cool, BSN, RN, CWS, Kim on 07/21/2019 91:91:66 Christina Blackburn ([id number]) -------------------------------------------------------------------------------- Patient/Caregiver Education Details Patient Name: Christina Blackburn Date of Service: 07/21/2019 8:00 AM Medical Record Number: [id number] Patient Account Number: [id number] Date of Birth/Gender: 11-11-1945 (73 y.o. F) Treating RN: Montey Hora Primary Care Physician: [name] Other Clinician: Referring Physician: [name] Treating Physician/Extender: Sharalyn Ink in Treatment: 4 Education Assessment Education Provided To: Patient Education Topics Provided Wound/Skin Impairment: Handouts: Other: wound care as  ordered Methods: Demonstration, Explain/Verbal Responses: State content correctly Electronic Signature(s) Signed: 07/21/2019 4:54:06 PM By: Montey Hora Entered By: Montey Hora on 07/21/2019 08:34:05 Christina Blackburn ([id number]) -------------------------------------------------------------------------------- Wound Assessment Details Patient Name: Christina Blackburn Date of Service: 07/21/2019 8:00 AM Medical Record  Number: [id number] Patient Account Number: [id number] Date of Birth/Sex: August 07, 1946 (73 y.o. F) Treating RN: Cornell Barman Primary Care Lorelai Huyser: [name] Other Clinician: Referring Seng Larch: [name] Treating Resa Rinks/Extender: Melburn Hake, HOYT Weeks in Treatment: 4 Wound Status Wound Number: 4 Primary Diabetic Wound/Ulcer of the Lower Extremity Etiology: Wound Location: Left Lower Leg - Posterior Secondary Arterial Insufficiency Ulcer Wounding Event: Gradually Appeared Etiology: Date Acquired: 03/16/2019 Wound Open Weeks Of Treatment: 4 Status: Clustered Wound: No Comorbid Chronic Obstructive Pulmonary Disease History: (COPD), Coronary Artery Disease, Hypertension, Type II Diabetes Photos Wound Measurements Length: (cm) 3.5 % Reduction i Width: (cm) 4 % Reduction i Depth: (cm) 0.1 Epithelializa Area: (cm) 10.996 Tunneling: Volume: (cm) 1.1 Undermining: n Area: -449% n Volume: -450% tion: None No No Wound Description Classification: Grade 2 Foul Odor Aft Wound Margin: Flat and Intact Slough/Fibrin Exudate Amount: Medium Exudate Type: Serous Exudate Color: amber er Cleansing: No o Yes Wound Bed Granulation Amount: None Present (0%) Exposed Structure Necrotic Amount: Large (67-100%) Fascia Exposed: No Necrotic Quality: Eschar Fat Layer (Subcutaneous Tissue) Exposed: Yes Tendon Exposed: No Muscle Exposed: No Joint Exposed: No Bone Exposed: No OANH, DEVIVO ([id number]) Treatment Notes Wound #4 (Left, Posterior Lower Leg) Notes santyl, moistened gauze, abd and netting Electronic Signature(s) Signed: 07/21/2019 5:14:16 PM By: Gretta Cool, BSN, RN, CWS, Kim RN, BSN Entered By: Gretta Cool, BSN, RN, CWS, Kim on 07/21/2019 08:16:41 Christina Blackburn ([id number]) -------------------------------------------------------------------------------- [place] Details Patient Name: Christina Blackburn Date of Service: 07/21/2019 8:00 AM Medical Record Number:  [id number] Patient Account Number: [id number] Date of Birth/Sex: [birth year] (73 y.o. F) Treating RN: Cornell Barman Primary Care Mikya Don: [name] Other Clinician: Referring Khriz Liddy: [name] Treating Timiyah Romito/Extender: Melburn Hake, HOYT Weeks in Treatment: 4 Vital Signs Time Taken: 08:12 Temperature (F): 98.4 Height (in): 59 Pulse (bpm): 68 Weight (lbs): 170 Respiratory Rate (breaths/min): 16 Body Mass Index (BMI): 34.3 Blood Pressure (mmHg): 154/77 Reference Range: 80 - 120 mg / dl Electronic Signature(s) Signed: 07/21/2019 5:14:16 PM By: Gretta Cool, BSN, RN, CWS, Kim RN, BSN Entered By: Gretta Cool, BSN, RN, CWS, Kim on 07/21/2019 08:12:58

## 2019-07-21 NOTE — Progress Notes (Addendum)
Christina Blackburn, Christina Blackburn ([id number]) Visit Report for 07/21/2019 Chief Complaint Document Details Patient Name: Christina Blackburn, Christina Blackburn Date of Service: 07/21/2019 8:00 AM Medical Record Number: [id number] Patient Account Number: [id number] Date of Birth/Sex: [birth year] (73 y.o. F) Treating RN: Montey Hora Primary Care Provider: [name] Other Clinician: Referring Provider: [name] Treating Provider/Extender: Melburn Hake, HOYT Weeks in Treatment: 4 Information Obtained from: Patient Chief Complaint Left LE Ulcer Electronic Signature(s) Signed: 07/21/2019 8:26:20 AM By: Worthy Keeler PA-C Entered By: Worthy Keeler on 07/21/2019 08:26:20 Christina Blackburn ([id number]) -------------------------------------------------------------------------------- Debridement Details Patient Name: Christina Blackburn Date of Service: 07/21/2019 8:00 AM Medical Record Number: [id number] Patient Account Number: [id number] Date of Birth/Sex: [birth year] (73 y.o. F) Treating RN: Montey Hora Primary Care Provider: [name] Other Clinician: Referring Provider: [name] Treating Provider/Extender: Melburn Hake, HOYT Weeks in Treatment: 4 Debridement Performed for Wound #4 Left,Posterior Lower Leg Assessment: Performed By: Clinician Montey Hora, RN Debridement Type: Chemical/Enzymatic/Mechanical Agent Used: Santyl Severity of Tissue Pre Fat layer exposed Debridement: Level of Consciousness (Pre- Awake and Alert procedure): Pre-procedure Verification/Time Yes - 08:32 Out Taken: Start Time: 08:32 Pain Control: Lidocaine 4% Topical Solution Instrument: Other : tongue blade Bleeding: None End Time: 08:33 Procedural Pain: 0 Post Procedural Pain: 0 Response to Treatment: Procedure was tolerated well Level of Consciousness Awake and Alert (Post-procedure): Post Debridement Measurements of Total Wound Length: (cm) 3.5 Width: (cm) 4 Depth: (cm) 0.1 Volume: (cm)  1.1 Character of Wound/Ulcer Post Debridement: Improved Severity of Tissue Post Debridement: Fat layer exposed Post Procedure Diagnosis Same as Pre-procedure Electronic Signature(s) Signed: 07/21/2019 4:54:06 PM By: Montey Hora Signed: 07/21/2019 5:57:32 PM By: Worthy Keeler PA-C Entered By: Montey Hora on 07/21/2019 08:32:59 Christina Blackburn ([id number]) -------------------------------------------------------------------------------- HPI Details Patient Name: Christina Blackburn Date of Service: 07/21/2019 8:00 AM Medical Record Number: [id number] Patient Account Number: [id number] Date of Birth/Sex: August 07, 1946 (73 y.o. F) Treating RN: Montey Hora Primary Care Provider: [name] Other Clinician: Referring Provider: [name] Treating Provider/Extender: Melburn Hake, HOYT Weeks in Treatment: 4 History of Present Illness HPI Description: 06/19/2019 on evaluation today patient presents today for an initial evaluation in our clinic regarding a problem that first began in June 2020. She states that she saw a red tender spot on her lower extremity. Subsequently she saw dermatology towards the beginning to middle of July. Following this time she actually ended up going to see her primary care provider on July 31 at which point she was given an antibiotic cream she states. On August 28 she actually ended up going to urgent care because this was getting worse and was placed on doxycycline at that time. This was the first round of doxycycline. On 06/17/2019 she was actually seen in the emergency department at [facility] where she was given IV vancomycin and recommended to continue with Augmentin as well as doxycycline on an outpatient basis following. She did have an x-ray performed at [facility] according to what she tells me today and this revealed that there was no signs of bone involvement with regard to infection. This obviously is excellent news. Right now she tells me she still having  discomfort. Unfortunately she also has significant issues with peripheral vascular disease which is also somewhat unfortunate. Especially in light of the fact that the wound is obviously on her lower extremity. She actually has an appointment to see vascular at [facility] for second opinion as well with regard to this that is not till sometime around the 21st of  this month September 2020. The patient does have a history of diabetes mellitus type 2 along with again peripheral vascular disease. Her most recent TBI's revealed that her ABIs were noncompressible bilaterally with a right TBI of 0.35 and a left TBI of 0.23 obviously she does have fairly significant peripheral vascular disease in my opinion. This is post intervention including stent placement. 06/30/19 on evaluation today patient appears to be doing fairly well in regard to her wound on the left posterior lower extremity. She has been tolerating the dressing changes without complication which is good news. Fortunately there's no signs of active infection. She actually does have an appointment with vascular surgery [facility] later this month. 07/14/2019 on evaluation today actually did see the patient back for reevaluation. Unfortunately the wound actually appears to be getting larger not better with everything that we have been trying up to this point. She did see vascular at [facility] last week. They unfortunately stated that they did not fill in their opinion that the patient would benefit from revascularization attempts as far as getting this wound to heal. It was noted in the note that if she is not healing appropriately and has any worsening of the wound that she may come back for revascularization at that point but they were not confident that that would make a difference in the wound healing. Nonetheless they did tell her that they felt like the wound would be able to heal but just would take a very long time. The patient obviously is somewhat frustrated  at this point which I completely understand. The wound also is measuring larger today compared to last week. 07/21/2019 on evaluation today patient appears to be doing more poorly at this time with regard to her left lower extremity ulcer. She has been tolerating the dressing changes without complication. With that being said I really do not see any significant improvement at this time. There may also be some infection going on we did finally get the results of the culture back and it does show that she has 2 bacteria potentially causing issues coupled with the erythema and pain that she is experiencing I think that this needs to be addressed. As of right now all of the vascular specialist including the second opinion that she had both agree that they feel she can heal with her current blood flow without any need for intervention. She was told that she was "at 50% flow reaching the wound". Electronic Signature(s) Signed: 07/21/2019 9:05:19 AM By: Worthy Keeler PA-C Previous Signature: 07/21/2019 9:03:06 AM Version By: Worthy Keeler PA-C Entered By: Worthy Keeler on 07/21/2019 09:05:18 Christina Blackburn ([id number]) -------------------------------------------------------------------------------- Physical Exam Details Patient Name: Christina Blackburn Date of Service: 07/21/2019 8:00 AM Medical Record Number: [id number] Patient Account Number: [id number] Date of Birth/Sex: [birth year] (73 y.o. F) Treating RN: Montey Hora Primary Care Provider: [name] Other Clinician: Referring Provider: [name] Treating Provider/Extender: Melburn Hake, HOYT Weeks in Treatment: 4 Constitutional Well-nourished and well-hydrated in no acute distress. Respiratory normal breathing without difficulty. clear to auscultation bilaterally. Cardiovascular regular rate and rhythm with normal S1, S2. Psychiatric this patient is able to make decisions and demonstrates good insight into disease  process. Alert and Oriented x 3. pleasant and cooperative. Notes Patient's wound bed currently shows eschar covering over the majority of the wound and it does not appear to be doing as well today unfortunately. I am concerned about the fact that she is having some issues as well with  increased pain, erythema, and coupled with the positive finding on the culture I do believe that she may need to have an antibiotic orally to help take care of some of this. She is definitely in agreement with taking something if need be. I am going to recommend cefdinir as that seems to be a good option for both of the bacteria along with at the same time not interacting with her current medication regimen. Electronic Signature(s) Signed: 07/21/2019 9:03:50 AM By: Worthy Keeler PA-C Entered By: Worthy Keeler on 07/21/2019 09:03:49 Christina Blackburn ([id number]) -------------------------------------------------------------------------------- Physician Orders Details Patient Name: Christina Blackburn Date of Service: 07/21/2019 8:00 AM Medical Record Number: [id number] Patient Account Number: [id number] Date of Birth/Sex: 1945-11-06 (73 y.o. F) Treating RN: Montey Hora Primary Care Provider: [name] Other Clinician: Referring Provider: [name] Treating Provider/Extender: Melburn Hake, HOYT Weeks in Treatment: 4 Verbal / Phone Orders: No Diagnosis Coding ICD-10 Coding Code Description E11.622 Type 2 diabetes mellitus with other skin ulcer L97.828 Non-pressure chronic ulcer of other part of left lower leg with other specified severity I73.89 Other specified peripheral vascular diseases Wound Cleansing Wound #4 Left,Posterior Lower Leg o Clean wound with Normal Christina Blackburn. o Dial antibacterial soap, wash wounds, rinse and pat dry prior to dressing wounds o May Shower, gently pat wound dry prior to applying new dressing. Primary Wound Dressing Wound #4 Left,Posterior Lower Leg o  Santyl Ointment Secondary Dressing Wound #4 Left,Posterior Lower Leg o ABD pad - stretch netting to secure o Christina Blackburn moistened gauze Dressing Change Frequency Wound #4 Left,Posterior Lower Leg o Change dressing every day. Follow-up Appointments Wound #4 Left,Posterior Lower Leg o Return Appointment in 1 week. Medications-please add to medication list. Wound #4 Left,Posterior Lower Leg o P.O. Antibiotics o Santyl Enzymatic Ointment o Other: - Americaine 20% benzocaine spray Patient Medications Allergies: hypo-allergenic tape Notifications Medication Indication Start End cefdinir 07/21/2019 DOSE 1 - oral 300 mg capsule - 1 capsule oral taken 2 times a day for 14 days EVAROSE, SCHUTT ([id number]) Electronic Signature(s) Signed: 07/21/2019 8:39:36 AM By: Worthy Keeler PA-C Entered By: Worthy Keeler on 07/21/2019 08:39:36 Christina Blackburn ([id number]) -------------------------------------------------------------------------------- Problem List Details Patient Name: Christina Blackburn Date of Service: 07/21/2019 8:00 AM Medical Record Number: [id number] Patient Account Number: [id number] Date of Birth/Sex: 05/19/1946 (73 y.o. F) Treating RN: Montey Hora Primary Care Provider: [name] Other Clinician: Referring Provider: [name] Treating Provider/Extender: Melburn Hake, HOYT Weeks in Treatment: 4 Active Problems ICD-10 Evaluated Encounter Code Description Active Date Today Diagnosis E11.622 Type 2 diabetes mellitus with other skin ulcer 06/19/2019 No Yes L97.828 Non-pressure chronic ulcer of other part of left lower leg with 06/19/2019 No Yes other specified severity I73.89 Other specified peripheral vascular diseases 06/19/2019 No Yes Inactive Problems Resolved Problems Electronic Signature(s) Signed: 07/21/2019 8:26:06 AM By: Worthy Keeler PA-C Entered By: Worthy Keeler on 07/21/2019 08:26:05 Christina Blackburn  ([id number]) -------------------------------------------------------------------------------- Progress Note Details Patient Name: Christina Blackburn Date of Service: 07/21/2019 8:00 AM Medical Record Number: [id number] Patient Account Number: [id number] Date of Birth/Sex: September 28, 1946 (73 y.o. F) Treating RN: Montey Hora Primary Care Provider: [name] Other Clinician: Referring Provider: [name] Treating Provider/Extender: Melburn Hake, HOYT Weeks in Treatment: 4 Subjective Chief Complaint Information obtained from Patient Left LE Ulcer History of Present Illness (HPI) 06/19/2019 on evaluation today patient presents today for an initial evaluation in our clinic regarding a problem that first began in June 2020. She states that she saw  a red tender spot on her lower extremity. Subsequently she saw dermatology towards the beginning to middle of July. Following this time she actually ended up going to see her primary care provider on July 31 at which point she was given an antibiotic cream she states. On August 28 she actually ended up going to urgent care because this was getting worse and was placed on doxycycline at that time. This was the first round of doxycycline. On 06/17/2019 she was actually seen in the emergency department at [facility] where she was given IV vancomycin and recommended to continue with Augmentin as well as doxycycline on an outpatient basis following. She did have an x-ray performed at [facility] according to what she tells me today and this revealed that there was no signs of bone involvement with regard to infection. This obviously is excellent news. Right now she tells me she still having discomfort. Unfortunately she also has significant issues with peripheral vascular disease which is also somewhat unfortunate. Especially in light of the fact that the wound is obviously on her lower extremity. She actually has an appointment to see vascular at [facility] for second opinion  as well with regard to this that is not till sometime around the 21st of this month September 2020. The patient does have a history of diabetes mellitus type 2 along with again peripheral vascular disease. Her most recent TBI's revealed that her ABIs were noncompressible bilaterally with a right TBI of 0.35 and a left TBI of 0.23 obviously she does have fairly significant peripheral vascular disease in my opinion. This is post intervention including stent placement. 06/30/19 on evaluation today patient appears to be doing fairly well in regard to her wound on the left posterior lower extremity. She has been tolerating the dressing changes without complication which is good news. Fortunately there's no signs of active infection. She actually does have an appointment with vascular surgery [facility] later this month. 07/14/2019 on evaluation today actually did see the patient back for reevaluation. Unfortunately the wound actually appears to be getting larger not better with everything that we have been trying up to this point. She did see vascular at [facility] last week. They unfortunately stated that they did not fill in their opinion that the patient would benefit from revascularization attempts as far as getting this wound to heal. It was noted in the note that if she is not healing appropriately and has any worsening of the wound that she may come back for revascularization at that point but they were not confident that that would make a difference in the wound healing. Nonetheless they did tell her that they felt like the wound would be able to heal but just would take a very long time. The patient obviously is somewhat frustrated at this point which I completely understand. The wound also is measuring larger today compared to last week. 07/21/2019 on evaluation today patient appears to be doing more poorly at this time with regard to her left lower extremity ulcer. She has been tolerating the dressing changes  without complication. With that being said I really do not see any significant improvement at this time. There may also be some infection going on we did finally get the results of the culture back and it does show that she has 2 bacteria potentially causing issues coupled with the erythema and pain that she is experiencing I think that this needs to be addressed. As of right now all of the vascular specialist including the  second opinion that she had both agree that they feel she can heal with her current blood flow without any need for intervention. She was told that she was "at 50% flow reaching the wound". Patient History Information obtained from Patient. Christina Blackburn, Christina Blackburn ([id number]) Family History Cancer - Siblings,Mother, Hypertension - Father, Kidney Disease - Father, Lung Disease - Father, Stroke - Maternal Grandparents, No family history of Diabetes, Heart Disease, Hereditary Spherocytosis, Seizures, Thyroid Problems, Tuberculosis. Social History Former smoker - ended on 10/15/2006, Marital Status - Married, Alcohol Use - Never, Drug Use - No History, Caffeine Use - Daily. Medical History Eyes Denies history of Cataracts, Glaucoma, Optic Neuritis Ear/Nose/Mouth/Throat Denies history of Chronic sinus problems/congestion, Middle ear problems Hematologic/Lymphatic Denies history of Anemia, Hemophilia, Human Immunodeficiency Virus, Lymphedema, Sickle Cell Disease Respiratory Patient has history of Chronic Obstructive Pulmonary Disease (COPD) Denies history of Aspiration, Asthma, Pneumothorax, Sleep Apnea, Tuberculosis Cardiovascular Patient has history of Coronary Artery Disease - Stent placed 2009, Hypertension Denies history of Angina, Arrhythmia, Congestive Heart Failure, Deep Vein Thrombosis, Hypotension, Myocardial Infarction, Peripheral Arterial Disease, Peripheral Venous Disease, Phlebitis, Vasculitis Gastrointestinal Denies history of Cirrhosis , Colitis, Crohn s,  Hepatitis A, Hepatitis B, Hepatitis C Endocrine Patient has history of Type II Diabetes - 2004 Denies history of Type I Diabetes Genitourinary Denies history of End Stage Renal Disease Immunological Denies history of Lupus Erythematosus, Raynaud s, Scleroderma Integumentary (Skin) Denies history of History of Burn, History of pressure wounds Musculoskeletal Denies history of Gout, Rheumatoid Arthritis, Osteoarthritis, Osteomyelitis Neurologic Denies history of Dementia, Neuropathy, Quadriplegia, Paraplegia, Seizure Disorder Oncologic Denies history of Received Chemotherapy, Received Radiation Psychiatric Denies history of Anorexia/bulimia, Confinement Anxiety Hospitalization/Surgery History - Stent placed. - gall bladder. - hysterectomy. - c-section x2. - ARM 2 stents placed 10/2018. Review of Systems (ROS) Constitutional Symptoms (General Health) Denies complaints or symptoms of Fatigue, Fever, Chills, Marked Weight Change. Respiratory Denies complaints or symptoms of Chronic or frequent coughs, Shortness of Breath. Cardiovascular Denies complaints or symptoms of Chest pain, LE edema. Psychiatric Denies complaints or symptoms of Anxiety, Claustrophobia. Christina Blackburn, Christina Blackburn ([id number]) Objective Constitutional Well-nourished and well-hydrated in no acute distress. Vitals Time Taken: 8:12 AM, Height: 59 in, Weight: 170 lbs, BMI: 34.3, Temperature: 98.4 F, Pulse: 68 bpm, Respiratory Rate: 16 breaths/min, Blood Pressure: 154/77 mmHg. Respiratory normal breathing without difficulty. clear to auscultation bilaterally. Cardiovascular regular rate and rhythm with normal S1, S2. Psychiatric this patient is able to make decisions and demonstrates good insight into disease process. Alert and Oriented x 3. pleasant and cooperative. General Notes: Patient's wound bed currently shows eschar covering over the majority of the wound and it does not appear to be doing as well today  unfortunately. I am concerned about the fact that she is having some issues as well with increased pain, erythema, and coupled with the positive finding on the culture I do believe that she may need to have an antibiotic orally to help take care of some of this. She is definitely in agreement with taking something if need be. I am going to recommend cefdinir as that seems to be a good option for both of the bacteria along with at the same time not interacting with her current medication regimen. Integumentary (Hair, Skin) Wound #4 status is Open. Original cause of wound was Gradually Appeared. The wound is located on the Left,Posterior Lower Leg. The wound measures 3.5cm length x 4cm width x 0.1cm depth; 10.996cm^2 area and 1.1cm^3 volume. There is Fat Layer (Subcutaneous Tissue) Exposed  exposed. There is no tunneling or undermining noted. There is a medium amount of serous drainage noted. The wound margin is flat and intact. There is no granulation within the wound bed. There is a large (67-100%) amount of necrotic tissue within the wound bed including Eschar. Assessment Active Problems ICD-10 Type 2 diabetes mellitus with other skin ulcer Non-pressure chronic ulcer of other part of left lower leg with other specified severity Other specified peripheral vascular diseases Procedures Wound #4 Pre-procedure diagnosis of Wound #4 is a Diabetic Wound/Ulcer of the Lower Extremity located on the [place], [place]. ([id number]) Leg .Severity of Tissue Pre Debridement is: Fat layer exposed. There was a Chemical/Enzymatic/Mechanical debridement performed by Montey Hora, RN. With the following instrument(s): tongue blade after achieving pain control using Lidocaine 4% Topical Solution. Agent used was [employer]. A time out was conducted at 08:32, prior to the start of the procedure. There was no bleeding. The procedure was tolerated well with a pain level of 0 throughout and a pain  level of 0 following the procedure. Post Debridement Measurements: 3.5cm length x 4cm width x 0.1cm depth; 1.1cm^3 volume. Character of Wound/Ulcer Post Debridement is improved. Severity of Tissue Post Debridement is: Fat layer exposed. Post procedure Diagnosis Wound #4: Same as Pre-Procedure Plan Wound Cleansing: Wound #4 Left,Posterior Lower Leg: Clean wound with Normal Christina Blackburn. Dial antibacterial soap, wash wounds, rinse and pat dry prior to dressing wounds May Shower, gently pat wound dry prior to applying new dressing. Primary Wound Dressing: Wound #4 Left,Posterior Lower Leg: Santyl Ointment Secondary Dressing: Wound #4 Left,Posterior Lower Leg: ABD pad - stretch netting to secure Christina Blackburn moistened gauze Dressing Change Frequency: Wound #4 Left,Posterior Lower Leg: Change dressing every day. Follow-up Appointments: Wound #4 Left,Posterior Lower Leg: Return Appointment in 1 week. Medications-please add to medication list.: Wound #4 Left,Posterior Lower Leg: P.O. Antibiotics Santyl Enzymatic Ointment Other: - Americaine 20% benzocaine spray The following medication(s) was prescribed: cefdinir oral 300 mg capsule 1 1 capsule oral taken 2 times a day for 14 days starting 07/21/2019 1. I would go ahead and place the patient on Ceftin ear for 14 days in order to treat the infection currently. 2. I am also going to suggest that we go ahead and switch back to the [place] as the wound is much too dry right now to achieve any type of healing and she is in agreement with this plan. We will use an ABD pad and stretch netting to secure this and not use an occlusive dressing which hopefully will keep this from becoming too moist as we had an issue with before. 3. I am also going to suggest she continue as much as possible to keep a close eye on the erythema if anything seems to be worsening she will let me know. We will see patient back for reevaluation in 1 week here in the clinic. If  anything worsens or changes patient will contact our office for additional recommendations. Electronic Signature(s) Signed: 07/21/2019 9:05:29 AM By: Foye Spurling ([id number]) Previous Signature: 07/21/2019 9:04:34 AM Version By: Worthy Keeler PA-C Entered By: Worthy Keeler on 07/21/2019 09:05:29 Christina Blackburn ([id number]) -------------------------------------------------------------------------------- ROS/PFSH Details Patient Name: Christina Blackburn Date of Service: 07/21/2019 8:00 AM Medical Record Number: [id number] Patient Account Number: [id number] Date of Birth/Sex: 1946/08/29 (73 y.o. F) Treating RN: Montey Hora Primary Care Provider: [name] Other Clinician: Referring Provider: [name] Treating Provider/Extender: Melburn Hake, HOYT Weeks in Treatment: 4 Information  Obtained From Patient Constitutional Symptoms (General Health) Complaints and Symptoms: Negative for: Fatigue; Fever; Chills; Marked Weight Change Respiratory Complaints and Symptoms: Negative for: Chronic or frequent coughs; Shortness of Breath Medical History: Positive for: Chronic Obstructive Pulmonary Disease (COPD) Negative for: Aspiration; Asthma; Pneumothorax; Sleep Apnea; Tuberculosis Cardiovascular Complaints and Symptoms: Negative for: Chest pain; LE edema Medical History: Positive for: Coronary Artery Disease - Stent placed 2009; Hypertension Negative for: Angina; Arrhythmia; Congestive Heart Failure; Deep Vein Thrombosis; Hypotension; Myocardial Infarction; Peripheral Arterial Disease; Peripheral Venous Disease; Phlebitis; Vasculitis Psychiatric Complaints and Symptoms: Negative for: Anxiety; Claustrophobia Medical History: Negative for: Anorexia/bulimia; Confinement Anxiety Eyes Medical History: Negative for: Cataracts; Glaucoma; Optic Neuritis Ear/Nose/Mouth/Throat Medical History: Negative for: Chronic sinus problems/congestion; Middle  ear problems Hematologic/Lymphatic Medical History: Negative for: Anemia; Hemophilia; Human Immunodeficiency Virus; Lymphedema; Sickle Cell Disease Christina Blackburn, Christina Blackburn ([id number]) Gastrointestinal Medical History: Negative for: Cirrhosis ; Colitis; Crohnos; Hepatitis A; Hepatitis B; Hepatitis C Endocrine Medical History: Positive for: Type II Diabetes - 2004 Negative for: Type I Diabetes Time with diabetes: 15 years Treated with: Oral agents Blood sugar tested every day: Yes Tested : 2-3 times daily Blood sugar testing results: Breakfast: 84 Genitourinary Medical History: Negative for: End Stage Renal Disease Immunological Medical History: Negative for: Lupus Erythematosus; Raynaudos; Scleroderma Integumentary (Skin) Medical History: Negative for: History of Burn; History of pressure wounds Musculoskeletal Medical History: Negative for: Gout; Rheumatoid Arthritis; Osteoarthritis; Osteomyelitis Neurologic Medical History: Negative for: Dementia; Neuropathy; Quadriplegia; Paraplegia; Seizure Disorder Oncologic Medical History: Negative for: Received Chemotherapy; Received Radiation Immunizations Pneumococcal Vaccine: Received Pneumococcal Vaccination: Yes Tetanus Vaccine: Last tetanus shot: 06/19/2013 Implantable Devices None Hospitalization / Surgery History Type of Hospitalization/Surgery Stent placed Christina Blackburn, Christina Blackburn ([id number]) gall bladder hysterectomy c-section x2 ARM 2 stents placed 10/2018 Family and Social History Cancer: Yes - Siblings,Mother; Diabetes: No; Heart Disease: No; Hereditary Spherocytosis: No; Hypertension: Yes - Father; Kidney Disease: Yes - Father; Lung Disease: Yes - Father; Seizures: No; Stroke: Yes - Maternal Grandparents; Thyroid Problems: No; Tuberculosis: No; Former smoker - ended on 10/15/2006; Marital Status - Married; Alcohol Use: Never; Drug Use: No History; Caffeine Use: Daily; Financial Concerns: No; Food, Clothing or Shelter  Needs: No; Support System Lacking: No; Transportation Concerns: No Physician Affirmation I have reviewed and agree with the above information. Electronic Signature(s) Signed: 07/21/2019 4:54:06 PM By: Montey Hora Signed: 07/21/2019 5:57:32 PM By: Worthy Keeler PA-C Entered By: Worthy Keeler on 07/21/2019 09:03:23 Christina Blackburn ([id number]) -------------------------------------------------------------------------------- SuperBill Details Patient Name: Christina Blackburn Date of Service: 07/21/2019 Medical Record Number: [id number] Patient Account Number: [id number] Date of Birth/Sex: 1946-08-06 (73 y.o. F) Treating RN: Montey Hora Primary Care Provider: [name] Other Clinician: Referring Provider: [name] Treating Provider/Extender: Melburn Hake, HOYT Weeks in Treatment: 4 Diagnosis Coding ICD-10 Codes Code Description E11.622 Type 2 diabetes mellitus with other skin ulcer L97.828 Non-pressure chronic ulcer of other part of left lower leg with other specified severity I73.89 Other specified peripheral vascular diseases Facility Procedures CPT4 Code: [id number] Description: [id number] - [facility]BRIDE W/O ANES NON SELECT Modifier: Quantity: 1 Physician Procedures CPT4 Code Description: [id number] Norman - WC PHYS LEVEL 4 - EST PT ICD-10 Diagnosis Description E11.622 Type 2 diabetes mellitus with other skin ulcer L97.828 Non-pressure chronic ulcer of other part of left lower leg with I73.89 Other specified peripheral  vascular diseases Modifier: other specified s Quantity: 1 everity Electronic Signature(s) Signed: 07/21/2019 9:04:45 AM By: Worthy Keeler PA-C Entered By: Worthy Keeler on 07/21/2019 09:04:45

## 2019-07-28 ENCOUNTER — Encounter: Payer: PPO | Admitting: Physician Assistant

## 2019-07-28 ENCOUNTER — Other Ambulatory Visit: Payer: Self-pay

## 2019-07-28 DIAGNOSIS — L97222 Non-pressure chronic ulcer of left calf with fat layer exposed: Secondary | ICD-10-CM | POA: Diagnosis not present

## 2019-07-28 DIAGNOSIS — E11622 Type 2 diabetes mellitus with other skin ulcer: Secondary | ICD-10-CM | POA: Diagnosis not present

## 2019-07-28 DIAGNOSIS — L299 Pruritus, unspecified: Secondary | ICD-10-CM | POA: Diagnosis not present

## 2019-07-28 DIAGNOSIS — M79662 Pain in left lower leg: Secondary | ICD-10-CM | POA: Diagnosis not present

## 2019-07-28 NOTE — Progress Notes (Addendum)
Christina Blackburn, Christina Blackburn ([id number]) Visit Report for 07/28/2019 Chief Complaint Document Details Patient Name: Christina Blackburn, Christina Blackburn Date of Service: 07/28/2019 8:30 AM Medical Record Number: [id number] Patient Account Number: [id number] Date of Birth/Sex: 1945/10/17 (73 y.o. F) Treating RN: Montey Hora Primary Care Provider: [name] Other Clinician: Referring Provider: [name] Treating Provider/Extender: Melburn Hake, HOYT Weeks in Treatment: 5 Information Obtained from: Patient Chief Complaint Left LE Ulcer Electronic Signature(s) Signed: 07/28/2019 8:51:30 AM By: Worthy Keeler PA-C Entered By: Worthy Keeler on 07/28/2019 08:51:30 Christina Blackburn ([id number]) -------------------------------------------------------------------------------- Debridement Details Patient Name: Christina Blackburn Date of Service: 07/28/2019 8:30 AM Medical Record Number: [id number] Patient Account Number: [id number] Date of Birth/Sex: December 07, 1945 (73 y.o. F) Treating RN: Montey Hora Primary Care Provider: [name] Other Clinician: Referring Provider: [name] Treating Provider/Extender: Melburn Hake, HOYT Weeks in Treatment: 5 Debridement Performed for Wound #4 Left,Posterior Lower Leg Assessment: Performed By: Clinician Montey Hora, RN Debridement Type: Chemical/Enzymatic/Mechanical Agent Used: Santyl Severity of Tissue Pre Fat layer exposed Debridement: Level of Consciousness (Pre- Awake and Alert procedure): Pre-procedure Verification/Time Yes - 08:55 Out Taken: Start Time: 08:55 Pain Control: Lidocaine 4% Topical Solution Instrument: Other : tongue blade Bleeding: None End Time: 08:56 Procedural Pain: 0 Post Procedural Pain: 0 Response to Treatment: Procedure was tolerated well Level of Consciousness Awake and Alert (Post-procedure): Post Debridement Measurements of Total Wound Length: (cm) 4 Width: (cm) 4 Depth: (cm) 0.1 Volume: (cm)  1.257 Character of Wound/Ulcer Post Debridement: Improved Severity of Tissue Post Debridement: Fat layer exposed Post Procedure Diagnosis Same as Pre-procedure Electronic Signature(s) Signed: 07/28/2019 5:32:28 PM By: Montey Hora Signed: 07/29/2019 6:12:50 PM By: Worthy Keeler PA-C Entered By: Montey Hora on 07/28/2019 08:55:59 Christina Blackburn ([id number]) -------------------------------------------------------------------------------- HPI Details Patient Name: Christina Blackburn Date of Service: 07/28/2019 8:30 AM Medical Record Number: [id number] Patient Account Number: [id number] Date of Birth/Sex: 12/24/1945 (73 y.o. F) Treating RN: Montey Hora Primary Care Provider: [name] Other Clinician: Referring Provider: [name] Treating Provider/Extender: Melburn Hake, HOYT Weeks in Treatment: 5 History of Present Illness HPI Description: 06/19/2019 on evaluation today patient presents today for an initial evaluation in our clinic regarding a problem that first began in June 2020. She states that she saw a red tender spot on her lower extremity. Subsequently she saw dermatology towards the beginning to middle of July. Following this time she actually ended up going to see her primary care provider on July 31 at which point she was given an antibiotic cream she states. On August 28 she actually ended up going to urgent care because this was getting worse and was placed on doxycycline at that time. This was the first round of doxycycline. On 06/17/2019 she was actually seen in the emergency department at [facility] where she was given IV vancomycin and recommended to continue with Augmentin as well as doxycycline on an outpatient basis following. She did have an x-ray performed at [facility] according to what she tells me today and this revealed that there was no signs of bone involvement with regard to infection. This obviously is excellent news. Right now she tells me she still  having discomfort. Unfortunately she also has significant issues with peripheral vascular disease which is also somewhat unfortunate. Especially in light of the fact that the wound is obviously on her lower extremity. She actually has an appointment to see vascular at [facility] for second opinion as well with regard to this that is not till sometime around the 21st of  this month September 2020. The patient does have a history of diabetes mellitus type 2 along with again peripheral vascular disease. Her most recent TBI's revealed that her ABIs were noncompressible bilaterally with a right TBI of 0.35 and a left TBI of 0.23 obviously she does have fairly significant peripheral vascular disease in my opinion. This is post intervention including stent placement. 06/30/19 on evaluation today patient appears to be doing fairly well in regard to her wound on the left posterior lower extremity. She has been tolerating the dressing changes without complication which is good news. Fortunately there's no signs of active infection. She actually does have an appointment with vascular surgery [facility] later this month. 07/14/2019 on evaluation today actually did see the patient back for reevaluation. Unfortunately the wound actually appears to be getting larger not better with everything that we have been trying up to this point. She did see vascular at [facility] last week. They unfortunately stated that they did not fill in their opinion that the patient would benefit from revascularization attempts as far as getting this wound to heal. It was noted in the note that if she is not healing appropriately and has any worsening of the wound that she may come back for revascularization at that point but they were not confident that that would make a difference in the wound healing. Nonetheless they did tell her that they felt like the wound would be able to heal but just would take a very long time. The patient obviously is somewhat  frustrated at this point which I completely understand. The wound also is measuring larger today compared to last week. 07/21/2019 on evaluation today patient appears to be doing more poorly at this time with regard to her left lower extremity ulcer. She has been tolerating the dressing changes without complication. With that being said I really do not see any significant improvement at this time. There may also be some infection going on we did finally get the results of the culture back and it does show that she has 2 bacteria potentially causing issues coupled with the erythema and pain that she is experiencing I think that this needs to be addressed. As of right now all of the vascular specialist including the second opinion that she had both agree that they feel she can heal with her current blood flow without any need for intervention. She was told that she was "at 50% flow reaching the wound". 07/28/2019 on evaluation today patient appears to be doing well with regard to her left leg ulcer as compared to last week. The measurements are slightly larger but at the same time she seems to be doing better with regard to the fact that she is having just a little itching much less pain much less redness and overall she is much happier with what she is seeing. No fevers, chills, nausea, vomiting, or diarrhea. Electronic Signature(s) Signed: 07/28/2019 9:02:11 AM By: Worthy Keeler PA-C Entered By: Worthy Keeler on 07/28/2019 09:02:10 Christina Blackburn ([id number] Blackburn ([id number]) -------------------------------------------------------------------------------- Physical Exam Details Patient Name: Christina Blackburn Date of Service: 07/28/2019 8:30 AM Medical Record Number: [id number] Patient Account Number: [id number] Date of Birth/Sex: [birth year] (73 y.o. F) Treating RN: Montey Hora Primary Care Provider: [name] Other Clinician: Referring Provider: [name] Treating Provider/Extender: Melburn Hake, HOYT Weeks in Treatment: 5 Constitutional Well-nourished and well-hydrated in no acute distress. Respiratory normal breathing without difficulty. clear to auscultation bilaterally. Cardiovascular regular rate and rhythm  with normal S1, S2. Psychiatric this patient is able to make decisions and demonstrates good insight into disease process. Alert and Oriented x 3. pleasant and cooperative. Notes Patient's wound bed currently showed eschar covering the surface of the wound although it does seem to be softening up with the use of the Santyl which I am pleased with at this point. Fortunately there is no signs of active infection at this time which is good news. I do believe the antibiotic is working. Electronic Signature(s) Signed: 07/28/2019 9:02:34 AM By: Worthy Keeler PA-C Entered By: Worthy Keeler on 07/28/2019 09:02:34 Christina Blackburn ([id number]) -------------------------------------------------------------------------------- Physician Orders Details Patient Name: Christina Blackburn Date of Service: 07/28/2019 8:30 AM Medical Record Number: [id number] Patient Account Number: [id number] Date of Birth/Sex: December 14, 1945 (73 y.o. F) Treating RN: Montey Hora Primary Care Provider: [name] Other Clinician: Referring Provider: [name] Treating Provider/Extender: Melburn Hake, HOYT Weeks in Treatment: 5 Verbal / Phone Orders: No Diagnosis Coding ICD-10 Coding Code Description E11.622 Type 2 diabetes mellitus with other skin ulcer L97.828 Non-pressure chronic ulcer of other part of left lower leg with other specified severity I73.89 Other specified peripheral vascular diseases Wound Cleansing Wound #4 Left,Posterior Lower Leg o Clean wound with Normal Saline. o Dial antibacterial soap, wash wounds, rinse and pat dry prior to dressing wounds o May Shower, gently pat wound dry prior to applying new  dressing. Primary Wound Dressing Wound #4 Left,Posterior Lower Leg o Santyl Ointment Secondary Dressing Wound #4 Left,Posterior Lower Leg o ABD pad - stretch netting to secure o Saline moistened gauze Dressing Change Frequency Wound #4 Left,Posterior Lower Leg o Change dressing every day. Follow-up Appointments Wound #4 Left,Posterior Lower Leg o Return Appointment in 1 week. Medications-please add to medication list. Wound #4 Left,Posterior Lower Leg o Santyl Enzymatic Ointment o Other: - Americaine 20% benzocaine spray Electronic Signature(s) Signed: 07/28/2019 5:32:28 PM By: Montey Hora Signed: 07/29/2019 6:12:50 PM By: Worthy Keeler PA-C Entered By: Montey Hora on 07/28/2019 08:56:24 Christina Blackburn ([id number]) Christina Blackburn ([id number]) -------------------------------------------------------------------------------- Problem List Details Patient Name: Christina Blackburn Date of Service: 07/28/2019 8:30 AM Medical Record Number: [id number] Patient Account Number: [id number] Date of Birth/Sex: 1946-06-22 (73 y.o. F) Treating RN: Montey Hora Primary Care Provider: [name] Other Clinician: Referring Provider: [name] Treating Provider/Extender: Melburn Hake, HOYT Weeks in Treatment: 5 Active Problems ICD-10 Evaluated Encounter Code Description Active Date Today Diagnosis E11.622 Type 2 diabetes mellitus with other skin ulcer 06/19/2019 No Yes L97.828 Non-pressure chronic ulcer of other part of left lower leg with 06/19/2019 No Yes other specified severity I73.89 Other specified peripheral vascular diseases 06/19/2019 No Yes Inactive Problems Resolved Problems Electronic Signature(s) Signed: 07/28/2019 8:51:17 AM By: Worthy Keeler PA-C Entered By: Worthy Keeler on 07/28/2019 08:51:17 Christina Blackburn ([id number]) -------------------------------------------------------------------------------- Progress Note  Details Patient Name: Christina Blackburn Date of Service: 07/28/2019 8:30 AM Medical Record Number: [id number] Patient Account Number: [id number] Date of Birth/Sex: 01-Jun-1946 (73 y.o. F) Treating RN: Montey Hora Primary Care Provider: [name] Other Clinician: Referring Provider: [name] Treating Provider/Extender: Melburn Hake, HOYT Weeks in Treatment: 5 Subjective Chief Complaint Information obtained from Patient Left LE Ulcer History of Present Illness (HPI) 06/19/2019 on evaluation today patient presents today for an initial evaluation in our clinic regarding a problem that first began in June 2020. She states that she saw a red tender spot on her lower extremity. Subsequently she saw dermatology towards the beginning to middle of July.  Following this time she actually ended up going to see her primary care provider on July 31 at which point she was given an antibiotic cream she states. On August 28 she actually ended up going to urgent care because this was getting worse and was placed on doxycycline at that time. This was the first round of doxycycline. On 06/17/2019 she was actually seen in the emergency department at [facility] where she was given IV vancomycin and recommended to continue with Augmentin as well as doxycycline on an outpatient basis following. She did have an x-ray performed at [facility] according to what she tells me today and this revealed that there was no signs of bone involvement with regard to infection. This obviously is excellent news. Right now she tells me she still having discomfort. Unfortunately she also has significant issues with peripheral vascular disease which is also somewhat unfortunate. Especially in light of the fact that the wound is obviously on her lower extremity. She actually has an appointment to see vascular at [facility] for second opinion as well with regard to this that is not till sometime around the 21st of this month September 2020. The  patient does have a history of diabetes mellitus type 2 along with again peripheral vascular disease. Her most recent TBI's revealed that her ABIs were noncompressible bilaterally with a right TBI of 0.35 and a left TBI of 0.23 obviously she does have fairly significant peripheral vascular disease in my opinion. This is post intervention including stent placement. 06/30/19 on evaluation today patient appears to be doing fairly well in regard to her wound on the left posterior lower extremity. She has been tolerating the dressing changes without complication which is good news. Fortunately there's no signs of active infection. She actually does have an appointment with vascular surgery [facility] later this month. 07/14/2019 on evaluation today actually did see the patient back for reevaluation. Unfortunately the wound actually appears to be getting larger not better with everything that we have been trying up to this point. She did see vascular at [facility] last week. They unfortunately stated that they did not fill in their opinion that the patient would benefit from revascularization attempts as far as getting this wound to heal. It was noted in the note that if she is not healing appropriately and has any worsening of the wound that she may come back for revascularization at that point but they were not confident that that would make a difference in the wound healing. Nonetheless they did tell her that they felt like the wound would be able to heal but just would take a very long time. The patient obviously is somewhat frustrated at this point which I completely understand. The wound also is measuring larger today compared to last week. 07/21/2019 on evaluation today patient appears to be doing more poorly at this time with regard to her left lower extremity ulcer. She has been tolerating the dressing changes without complication. With that being said I really do not see any significant improvement at this time.  There may also be some infection going on we did finally get the results of the culture back and it does show that she has 2 bacteria potentially causing issues coupled with the erythema and pain that she is experiencing I think that this needs to be addressed. As of right now all of the vascular specialist including the second opinion that she had both agree that they feel she can heal with her current blood flow without  any need for intervention. She was told that she was "at 50% flow reaching the wound". 07/28/2019 on evaluation today patient appears to be doing well with regard to her left leg ulcer as compared to last week. The measurements are slightly larger but at the same time she seems to be doing better with regard to the fact that she is having just a little itching much less pain much less redness and overall she is much happier with what she is seeing. No fevers, chills, nausea, vomiting, or diarrhea. Christina Blackburn, Christina Blackburn ([id number]) Patient History Information obtained from Patient. Family History Cancer - Siblings,Mother, Hypertension - Father, Kidney Disease - Father, Lung Disease - Father, Stroke - Maternal Grandparents, No family history of Diabetes, Heart Disease, Hereditary Spherocytosis, Seizures, Thyroid Problems, Tuberculosis. Social History Former smoker - ended on 10/15/2006, Marital Status - Married, Alcohol Use - Never, Drug Use - No History, Caffeine Use - Daily. Medical History Eyes Denies history of Cataracts, Glaucoma, Optic Neuritis Ear/Nose/Mouth/Throat Denies history of Chronic sinus problems/congestion, Middle ear problems Hematologic/Lymphatic Denies history of Anemia, Hemophilia, Human Immunodeficiency Virus, Lymphedema, Sickle Cell Disease Respiratory Patient has history of Chronic Obstructive Pulmonary Disease (COPD) Denies history of Aspiration, Asthma, Pneumothorax, Sleep Apnea, Tuberculosis Cardiovascular Patient has history of Coronary Artery  Disease - Stent placed 2009, Hypertension Denies history of Angina, Arrhythmia, Congestive Heart Failure, Deep Vein Thrombosis, Hypotension, Myocardial Infarction, Peripheral Arterial Disease, Peripheral Venous Disease, Phlebitis, Vasculitis Gastrointestinal Denies history of Cirrhosis , Colitis, Crohn s, Hepatitis A, Hepatitis B, Hepatitis C Endocrine Patient has history of Type II Diabetes - 2004 Denies history of Type I Diabetes Genitourinary Denies history of End Stage Renal Disease Immunological Denies history of Lupus Erythematosus, Raynaud s, Scleroderma Integumentary (Skin) Denies history of History of Burn, History of pressure wounds Musculoskeletal Denies history of Gout, Rheumatoid Arthritis, Osteoarthritis, Osteomyelitis Neurologic Denies history of Dementia, Neuropathy, Quadriplegia, Paraplegia, Seizure Disorder Oncologic Denies history of Received Chemotherapy, Received Radiation Psychiatric Denies history of Anorexia/bulimia, Confinement Anxiety Hospitalization/Surgery History - Stent placed. - gall bladder. - hysterectomy. - c-section x2. - ARM 2 stents placed 10/2018. Review of Systems (ROS) Constitutional Symptoms (General Health) Denies complaints or symptoms of Fatigue, Fever, Chills, Marked Weight Change. Respiratory Denies complaints or symptoms of Chronic or frequent coughs, Shortness of Breath. Cardiovascular Denies complaints or symptoms of Chest pain, LE edema. Psychiatric Denies complaints or symptoms of Anxiety, Claustrophobia. Christina Blackburn, Christina Blackburn ([id number]) Objective Constitutional Well-nourished and well-hydrated in no acute distress. Vitals Time Taken: 8:30 AM, Height: 59 in, Weight: 170 lbs, BMI: 34.3, Temperature: 98.2 F, Pulse: 105 bpm, Respiratory Rate: 18 breaths/min, Blood Pressure: 190/90 mmHg. Respiratory normal breathing without difficulty. clear to auscultation bilaterally. Cardiovascular regular rate and rhythm with normal S1,  S2. Psychiatric this patient is able to make decisions and demonstrates good insight into disease process. Alert and Oriented x 3. pleasant and cooperative. General Notes: Patient's wound bed currently showed eschar covering the surface of the wound although it does seem to be softening up with the use of the Santyl which I am pleased with at this point. Fortunately there is no signs of active infection at this time which is good news. I do believe the antibiotic is working. Integumentary (Hair, Skin) Wound #4 status is Open. Original cause of wound was Gradually Appeared. The wound is located on the Left,Posterior Lower Leg. The wound measures 4cm length x 4cm width x 0.1cm depth; 12.566cm^2 area and 1.257cm^3 volume. There is Fat Layer (Subcutaneous Tissue) Exposed exposed. There is  no tunneling or undermining noted. There is a medium amount of serous drainage noted. The wound margin is flat and intact. There is no granulation within the wound bed. There is a large (67-100%) amount of necrotic tissue within the wound bed including Eschar. Assessment Active Problems ICD-10 Type 2 diabetes mellitus with other skin ulcer Non-pressure chronic ulcer of other part of left lower leg with other specified severity Other specified peripheral vascular diseases Procedures Christina Blackburn, Christina Blackburn ([id number]) Wound #4 Pre-procedure diagnosis of Wound #4 is a Diabetic Wound/Ulcer of the Lower Extremity located on the Left,Posterior Lower Leg .Severity of Tissue Pre Debridement is: Fat layer exposed. There was a Chemical/Enzymatic/Mechanical debridement performed by Montey Hora, RN. With the following instrument(s): tongue blade after achieving pain control using Lidocaine 4% Topical Solution. Agent used was [employer]. A time out was conducted at 08:55, prior to the start of the procedure. There was no bleeding. The procedure was tolerated well with a pain level of 0 throughout and a pain level of 0  following the procedure. Post Debridement Measurements: 4cm length x 4cm width x 0.1cm depth; 1.257cm^3 volume. Character of Wound/Ulcer Post Debridement is improved. Severity of Tissue Post Debridement is: Fat layer exposed. Post procedure Diagnosis Wound #4: Same as Pre-Procedure Plan Wound Cleansing: Wound #4 Left,Posterior Lower Leg: Clean wound with Normal Saline. Dial antibacterial soap, wash wounds, rinse and pat dry prior to dressing wounds May Shower, gently pat wound dry prior to applying new dressing. Primary Wound Dressing: Wound #4 Left,Posterior Lower Leg: Santyl Ointment Secondary Dressing: Wound #4 Left,Posterior Lower Leg: ABD pad - stretch netting to secure Saline moistened gauze Dressing Change Frequency: Wound #4 Left,Posterior Lower Leg: Change dressing every day. Follow-up Appointments: Wound #4 Left,Posterior Lower Leg: Return Appointment in 1 week. Medications-please add to medication list.: Wound #4 Left,Posterior Lower Leg: Santyl Enzymatic Ointment Other: - Americaine 20% benzocaine spray 1. I would recommend currently that we continue with the Santyl as that seems to be beneficial for her she is in agreement with plan. 2. I am also going to suggest she continue with her antibiotic as that seems to be doing an excellent job. Obviously I do believe that if she continues with this hopefully it should help to clear up the infection and then subsequently the Santyl can start to loosen up all the slough and necrotic material on the surface of the wound. 3. We will also continue with having her elevate her leg as she needs to although she really does not have much swelling at all today things look much better in this regard. 4. The patient does tell me that she will be going to the beach for the entire month of November. For that reason we are coming up with a plan for me to do telehealth visits with her during that time. We are given her the instructions for  resuming we will see what she thinks about that if that is not really as good for her than I may just do face time which may just be easier anyway to be honest. Either way we will keep an eye on things for her during that time. We will see patient back for reevaluation in 1 week here in the clinic. If anything worsens or changes patient will contact our office for additional recommendations. Christina Blackburn, Christina Blackburn ([id number]) Electronic Signature(s) Signed: 07/28/2019 9:03:48 AM By: Worthy Keeler PA-C Entered By: Worthy Keeler on 07/28/2019 09:03:48 Christina Blackburn ([id number]) -------------------------------------------------------------------------------- ROS/PFSH Details Patient Name: Christina Blackburn Date of Service: 07/28/2019 8:30 AM Medical Record Number: [id number] Patient Account Number: [id number] Date of Birth/Sex: October 06, 1946 (73 y.o. F) Treating RN: Montey Hora Primary Care Provider: [name] Other Clinician: Referring Provider: [name] Treating Provider/Extender: Melburn Hake, HOYT Weeks in Treatment: 5 Information Obtained From Patient Constitutional Symptoms (General Health) Complaints and Symptoms: Negative for: Fatigue; Fever; Chills; Marked Weight Change Respiratory Complaints and Symptoms: Negative for: Chronic or frequent coughs; Shortness of Breath Medical History: Positive for: Chronic Obstructive Pulmonary Disease (COPD) Negative for: Aspiration; Asthma; Pneumothorax; Sleep Apnea; Tuberculosis Cardiovascular Complaints and Symptoms: Negative for: Chest pain; LE edema Medical History: Positive for: Coronary Artery Disease - Stent placed 2009; Hypertension Negative for: Angina; Arrhythmia; Congestive Heart Failure; Deep Vein Thrombosis; Hypotension; Myocardial Infarction; Peripheral Arterial Disease; Peripheral Venous Disease; Phlebitis; Vasculitis Psychiatric Complaints and Symptoms: Negative for: Anxiety; Claustrophobia Medical  History: Negative for: Anorexia/bulimia; Confinement Anxiety Eyes Medical History: Negative for: Cataracts; Glaucoma; Optic Neuritis Ear/Nose/Mouth/Throat Medical History: Negative for: Chronic sinus problems/congestion; Middle ear problems Hematologic/Lymphatic Medical History: Negative for: Anemia; Hemophilia; Human Immunodeficiency Virus; Lymphedema; Sickle Cell Disease Christina Blackburn, Christina Blackburn ([id number]) Gastrointestinal Medical History: Negative for: Cirrhosis ; Colitis; Crohnos; Hepatitis A; Hepatitis B; Hepatitis C Endocrine Medical History: Positive for: Type II Diabetes - 2004 Negative for: Type I Diabetes Time with diabetes: 15 years Treated with: Oral agents Blood sugar tested every day: Yes Tested : 2-3 times daily Blood sugar testing results: Breakfast: 84 Genitourinary Medical History: Negative for: End Stage Renal Disease Immunological Medical History: Negative for: Lupus Erythematosus; Raynaudos; Scleroderma Integumentary (Skin) Medical History: Negative for: History of Burn; History of pressure wounds Musculoskeletal Medical History: Negative for: Gout; Rheumatoid Arthritis; Osteoarthritis; Osteomyelitis Neurologic Medical History: Negative for: Dementia; Neuropathy; Quadriplegia; Paraplegia; Seizure Disorder Oncologic Medical History: Negative for: Received Chemotherapy; Received Radiation Immunizations Pneumococcal Vaccine: Received Pneumococcal Vaccination: Yes Tetanus Vaccine: Last tetanus shot: 06/19/2013 Implantable Devices None Hospitalization / Surgery History Type of Hospitalization/Surgery Stent placed Christina Blackburn, Christina Blackburn ([id number]) gall bladder hysterectomy c-section x2 ARM 2 stents placed 10/2018 Family and Social History Cancer: Yes - Siblings,Mother; Diabetes: No; Heart Disease: No; Hereditary Spherocytosis: No; Hypertension: Yes - Father; Kidney Disease: Yes - Father; Lung Disease: Yes - Father; Seizures: No; Stroke: Yes -  Maternal Grandparents; Thyroid Problems: No; Tuberculosis: No; Former smoker - ended on 10/15/2006; Marital Status - Married; Alcohol Use: Never; Drug Use: No History; Caffeine Use: Daily; Financial Concerns: No; Food, Clothing or Shelter Needs: No; Support System Lacking: No; Transportation Concerns: No Physician Affirmation I have reviewed and agree with the above information. Electronic Signature(s) Signed: 07/28/2019 5:32:28 PM By: Montey Hora Signed: 07/29/2019 6:12:50 PM By: Worthy Keeler PA-C Entered By: Worthy Keeler on 07/28/2019 09:02:23 Christina Blackburn ([id number]) -------------------------------------------------------------------------------- SuperBill Details Patient Name: Christina Blackburn Date of Service: 07/28/2019 Medical Record Number: [id number] Patient Account Number: [id number] Date of Birth/Sex: 09-Jan-1946 (73 y.o. F) Treating RN: Montey Hora Primary Care Provider: [name] Other Clinician: Referring Provider: [name] Treating Provider/Extender: Melburn Hake, HOYT Weeks in Treatment: 5 Diagnosis Coding ICD-10 Codes Code Description E11.622 Type 2 diabetes mellitus with other skin ulcer L97.828 Non-pressure chronic ulcer of other part of left lower leg with other specified severity I73.89 Other specified peripheral vascular diseases Facility Procedures CPT4 Code: [id number] Description: [id number] - DEBRIDE W/O ANES NON SELECT Modifier: Quantity: 1 Physician Procedures CPT4 Code Description: [id number] - WC PHYS LEVEL 4 - EST PT ICD-10 Diagnosis Description E11.622 Type 2 diabetes mellitus with other skin ulcer L97.828  Non-pressure chronic ulcer of other part of left lower leg with I73.89 Other specified peripheral  vascular diseases Modifier: other specified s Quantity: 1 everity Electronic Signature(s) Signed: 07/28/2019 9:04:25 AM By: Worthy Keeler PA-C Entered By: Worthy Keeler on 07/28/2019 09:04:25

## 2019-07-29 NOTE — Progress Notes (Signed)
Rady, Jimeka G. ([id number]) Visit Report for 07/28/2019 Arrival Information Details Patient Name: Christina Blackburn, Christina G. Date of Service: 07/28/2019 8:30 AM Medical Record Number: [id number] Patient Account Number: [id number] Date of Birth/Sex: 10/07/1946 (73 y.o. F) Treating RN: Biell, Kristina Primary Care : Sparks, Jeffrey Other Clinician: Referring : Sparks, Jeffrey Treating /Extender: STONE III, HOYT Weeks in Treatment: 5 Visit Information History Since Last Visit Added or deleted any medications: No Patient Arrived: Ambulatory Any new allergies or adverse reactions: No Arrival Time: 08:29 Had a fall or experienced change in No Accompanied By: self activities of daily living that may affect Transfer Assistance: None risk of falls: Patient Identification Verified: Yes Signs or symptoms of abuse/neglect since last visito No Secondary Verification Process Yes Hospitalized since last visit: No Completed: Has Dressing in Place as Prescribed: Yes Patient Has Alerts: Yes Has Compression in Place as Prescribed: No Patient Alerts: DMII Pain Present Now: Yes ABI Brandon BILATERAL >220 03/23/19 TBI L .23 R .35 Electronic Signature(s) Signed: 07/29/2019 4:37:37 PM By: Biell, Kristina Entered By: Biell, Kristina on 07/28/2019 08:29:29 Christina Blackburn, Christina G. ([id number]) -------------------------------------------------------------------------------- Encounter Discharge Information Details Patient Name: Christina Blackburn, Christina G. Date of Service: 07/28/2019 8:30 AM Medical Record Number: [id number] Patient Account Number: [id number] Date of Birth/Sex: 09/23/1946 (73 y.o. F) Treating RN: Dorthy, Joanna Primary Care : Sparks, Jeffrey Other Clinician: Referring : Sparks, Jeffrey Treating /Extender: STONE III, HOYT Weeks in Treatment: 5 Encounter Discharge Information Items Post Procedure Vitals Discharge Condition: Stable Temperature (F):  98.2 Ambulatory Status: Ambulatory Pulse (bpm): 105 Discharge Destination: Home Respiratory Rate (breaths/min): 16 Transportation: Private Auto Blood Pressure (mmHg): 190/90 Accompanied By: self Schedule Follow-up Appointment: Yes Clinical Summary of Care: Electronic Signature(s) Signed: 07/28/2019 5:32:28 PM By: Dorthy, Joanna Entered By: Dorthy, Joanna on 07/28/2019 08:58:27 Christina Blackburn, Christina G. ([id number]) -------------------------------------------------------------------------------- Lower Extremity Assessment Details Patient Name: Wenberg, Nayanna G. Date of Service: 07/28/2019 8:30 AM Medical Record Number: [id number] Patient Account Number: [id number] Date of Birth/Sex: 05/27/1946 (73 y.o. F) Treating RN: Biell, Kristina Primary Care : Sparks, Jeffrey Other Clinician: Referring : Sparks, Jeffrey Treating /Extender: STONE III, HOYT Weeks in Treatment: 5 Vascular Assessment Pulses: Dorsalis Pedis Palpable: [Left:Yes] Posterior Tibial Palpable: [Left:Yes] Electronic Signature(s) Signed: 07/29/2019 4:37:37 PM By: Biell, Kristina Entered By: Biell, Kristina on 07/28/2019 08:33:09 Christina Blackburn, Christina G. ([id number]) -------------------------------------------------------------------------------- Multi Wound Chart Details Patient Name: Christina Blackburn, Christina G. Date of Service: 07/28/2019 8:30 AM Medical Record Number: [id number] Patient Account Number: [id number] Date of Birth/Sex: 07/30/1946 (73 y.o. 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 N/A N/A Comorbid History: Chronic Obstructive N/A N/A Pulmonary Disease (COPD), Coronary Artery Disease, Hypertension, Type II Diabetes Date Acquired: 03/16/2019 N/A N/A Weeks of Treatment: 5 N/A N/A Wound Status: Open N/A N/A Measurements L x W x D 4x4x0.1 N/A N/A (cm) Area (cm) : 12.566 N/A N/A Volume (cm) : 1.257 N/A N/A % Reduction in Area: -527.40% N/A N/A % Reduction in Volume: -528.50% N/A N/A Classification: Grade 1 N/A N/A Exudate Amount: Medium N/A N/A Exudate Type: Serous N/A N/A Exudate Color: amber N/A N/A Wound Margin: Flat and Intact N/A N/A   Granulation Amount: None Present (0%) N/A N/A Necrotic Amount: Large (67-100%) N/A N/A Necrotic Tissue: Eschar N/A N/A Exposed Structures: Fat Layer (Subcutaneous N/A N/A Tissue) Exposed: Yes Fascia: No Christina Blackburn, Christina G. ([id number]) Tendon: No Muscle: No Joint: No Bone: No Epithelialization: None N/A N/A Treatment Notes Electronic Signature(s) Signed: 07/28/2019 5:32:28 PM By: Dorthy, Joanna Entered By: Dorthy, Joanna on 07/28/2019 08:54:35 Christina Blackburn, Christina G. ([id number]) -------------------------------------------------------------------------------- Multi-Disciplinary Care Plan Details Patient Name: Christina Blackburn, Christina G. Date of Service: 07/28/2019 8:30 AM Medical Record Number: [id number] Patient Account Number: [id number] Date of Birth/Sex: 01/29/1946 (73 y.o. F) Treating RN: Dorthy, Joanna Primary Care : Sparks, Jeffrey Other Clinician: Referring : Sparks, Jeffrey Treating /Extender: STONE III, HOYT Weeks in Treatment: 5 Active Inactive Abuse / Safety / Falls / Self Care Management Nursing Diagnoses: Potential for falls Goals: Patient will remain injury free related to falls Date Initiated: 06/19/2019 Target Resolution Date: 09/19/2019 Goal Status:  Active Interventions: Assess fall risk on admission and as needed Notes: Necrotic Tissue Nursing Diagnoses: Impaired tissue integrity related to necrotic/devitalized tissue Goals: Necrotic/devitalized tissue will be minimized in the wound bed Date Initiated: 06/19/2019 Target Resolution Date: 09/19/2019 Goal Status: Active Interventions: Provide education on necrotic tissue and debridement process Notes: Orientation to the Wound Care Program Nursing Diagnoses: Knowledge deficit related to the wound healing center program Goals: Patient/caregiver will verbalize understanding of the Wound Healing Center Program Date Initiated: 06/19/2019 Target Resolution Date: 09/19/2019 Goal Status: Active Interventions: Provide education on orientation to the wound center Christina Blackburn, Christina G. ([id number]) Notes: Pain, Acute or Chronic Nursing Diagnoses: Pain, acute or chronic: actual or potential Goals: Patient/caregiver will verbalize comfort level met Date Initiated: 06/19/2019 Target Resolution Date: 09/19/2019 Goal Status: Active Interventions: Complete pain assessment as per visit requirements Notes: Wound/Skin Impairment Nursing Diagnoses: Impaired tissue integrity Goals: Ulcer/skin breakdown will heal within 14 weeks Date Initiated: 06/19/2019 Target Resolution Date: 09/19/2019 Goal Status: Active Interventions: Assess patient/caregiver ability to obtain necessary supplies Assess patient/caregiver ability to perform ulcer/skin care regimen upon admission and as needed Assess ulceration(s) every visit Notes: Electronic Signature(s) Signed: 07/28/2019 5:32:28 PM By: Dorthy, Joanna Entered By: Dorthy, Joanna on 07/28/2019 08:54:07 Christina Blackburn, Christina G. ([id number]) -------------------------------------------------------------------------------- Pain Assessment Details Patient Name: Christina Blackburn, Christina G. Date of Service: 07/28/2019 8:30 AM Medical Record Number: [id number] Patient Account  Number: [id number] Date of Birth/Sex: 04/19/1946 (72 y.o. F) Treating RN: Biell, Kristina Primary Care : Sparks, Jeffrey Other Clinician: Referring : Sparks, Jeffrey Treating /Extender: STONE III, HOYT Weeks in Treatment: 5 Active Problems Location of Pain Severity and Description of Pain Patient Has Paino Yes Site Locations Rate the pain. Current Pain Level: 4 Character of Pain Describe the Pain: Shooting, Tender, Throbbing Pain Management and Medication Current Pain Management: Electronic Signature(s) Signed: 07/29/2019 4:37:37 PM By: Biell, Kristina Entered By: Biell, Kristina on 07/28/2019 08:29:59 Christina Blackburn, Christina Blackburn G. ([id number]) -------------------------------------------------------------------------------- Patient/Caregiver Education Details Patient Name: Latner, Donnelle G. Date of Service: 07/28/2019 8:30 AM Medical Record Number: [id number] Patient Account Number: [id number] Date of Birth/Gender: 07/26/1946 (72 y.o. F) Treating RN: Dorthy, Joanna Primary Care Physician: Sparks, Jeffrey Other Clinician: Referring Physician: Sparks, Jeffrey Treating Physician/Extender: STONE III, HOYT Weeks in Treatment: 5 Education Assessment Education Provided To: Patient Education Topics Provided Wound/Skin Impairment: Handouts: Other: wound care as ordered Methods: Demonstration, Explain/Verbal Responses: State content correctly Electronic Signature(s) Signed: 07/28/2019 5:32:28 PM By: Dorthy, Joanna Entered By: Dorthy, Joanna on 07/28/2019 08:57:41 Dawkins, Tuwanda G. ([id number]) -------------------------------------------------------------------------------- Wound Assessment Details Patient Name: Simeone, Natiya G. Date of Service: 07/28/2019 8:30 AM Medical   Record Number: [id number] Patient Account Number: [id number] Date of Birth/Sex: 12/17/1945 (72 y.o. F) Treating RN: Biell, Kristina Primary Care : Sparks, Jeffrey Other  Clinician: Referring : Sparks, Jeffrey Treating /Extender: STONE III, HOYT Weeks in Treatment: 5 Wound Status Wound Number: 4 Primary Diabetic Wound/Ulcer of the Lower Extremity Etiology: Wound Location: Left Lower Leg - Posterior Secondary Arterial Insufficiency Ulcer Wounding Event: Gradually Appeared Etiology: Date Acquired: 03/16/2019 Wound Open Weeks Of Treatment: 5 Status: Clustered Wound: No Comorbid Chronic Obstructive Pulmonary Disease History: (COPD), Coronary Artery Disease, Hypertension, Type II Diabetes Photos Wound Measurements Length: (cm) 4 % Reduction i Width: (cm) 4 % Reduction i Depth: (cm) 0.1 Epithelializa Area: (cm) 12.566 Tunneling: Volume: (cm) 1.257 Undermining: n Area: -527.4% n Volume: -528.5% tion: None No No Wound Description Classification: Grade 1 Foul Odor Aft Wound Margin: Flat and Intact Slough/Fibrin Exudate Amount: Medium Exudate Type: Serous Exudate Color: amber er Cleansing: No o Yes Wound Bed Granulation Amount: None Present (0%) Exposed Structure Necrotic Amount: Large (67-100%) Fascia Exposed: No Necrotic Quality: Eschar Fat Layer (Subcutaneous Tissue) Exposed: Yes Tendon Exposed: No Muscle Exposed: No Joint Exposed: No Bone Exposed: No Mcvicar, Annalisia G. ([id number]) Treatment Notes Wound #4 (Left, Posterior Lower Leg) Notes santyl, moistened gauze, abd and netting Electronic Signature(s) Signed: 07/29/2019 4:37:37 PM By: Biell, Kristina Entered By: Biell, Kristina on 07/28/2019 08:32:43 Ryback, Tkai G. ([id number]) -------------------------------------------------------------------------------- Vitals Details Patient Name: Lio, Dynesha G. Date of Service: 07/28/2019 8:30 AM Medical Record Number: [id number] Patient Account Number: [id number] Date of Birth/Sex: 01/22/1946 (72 y.o. F) Treating RN: Biell, Kristina Primary Care : Sparks, Jeffrey Other Clinician: Referring :  Sparks, Jeffrey Treating /Extender: STONE III, HOYT Weeks in Treatment: 5 Vital Signs Time Taken: 08:30 Temperature (°F): 98.2 Height (in): 59 Pulse (bpm): 105 Weight (lbs): 170 Respiratory Rate (breaths/min): 18 Body Mass Index (BMI): 34.3 Blood Pressure (mmHg): 190/90 Reference Range: 80 - 120 mg / dl Electronic Signature(s) Signed: 07/29/2019 4:37:37 PM By: Biell, Kristina Entered By: Biell, Kristina on 07/28/2019 08:30:36 

## 2019-07-30 ENCOUNTER — Other Ambulatory Visit: Payer: Self-pay | Admitting: Internal Medicine

## 2019-07-30 DIAGNOSIS — Z1231 Encounter for screening mammogram for malignant neoplasm of breast: Secondary | ICD-10-CM

## 2019-08-04 ENCOUNTER — Other Ambulatory Visit: Payer: Self-pay

## 2019-08-04 ENCOUNTER — Encounter: Payer: PPO | Admitting: Physician Assistant

## 2019-08-04 DIAGNOSIS — L97222 Non-pressure chronic ulcer of left calf with fat layer exposed: Secondary | ICD-10-CM | POA: Diagnosis not present

## 2019-08-04 DIAGNOSIS — E11622 Type 2 diabetes mellitus with other skin ulcer: Secondary | ICD-10-CM | POA: Diagnosis not present

## 2019-08-04 NOTE — Progress Notes (Signed)
Christina Blackburn, Christina Blackburn ([id number]) Visit Report for 08/04/2019 Arrival Information Details Patient Name: Christina Blackburn, Christina Blackburn Date of Service: 08/04/2019 8:30 AM Medical Record Number: [id number] Patient Account Number: [id number] Date of Birth/Sex: Jul 29, 1946 (73 y.o. F) Treating RN: Army Melia Primary Care Rafiel Mecca: [name] Other Clinician: Referring Stone Spirito: [name] Treating Tanishka Drolet/Extender: Melburn Hake, HOYT Weeks in Treatment: 6 Visit Information History Since Last Visit Added or deleted any medications: No Patient Arrived: Ambulatory Any new allergies or adverse reactions: No Arrival Time: 08:32 Had a fall or experienced change in No Accompanied By: self activities of daily living that may affect Transfer Assistance: None risk of falls: Patient Identification Verified: Yes Signs or symptoms of abuse/neglect since last visito No Patient Has Alerts: Yes Hospitalized since last visit: No Patient Alerts: DMII Has Dressing in Place as Prescribed: Yes ABI [place] BILATERAL >220 Pain Present Now: Yes 03/23/19 TBI L .23 R .35 Electronic Signature(s) Signed: 08/04/2019 9:57:59 AM By: Army Melia Entered By: Army Melia on 08/04/2019 08:33:14 Christina Blackburn ([id number]) -------------------------------------------------------------------------------- Encounter Discharge Information Details Patient Name: Christina Blackburn Date of Service: 08/04/2019 8:30 AM Medical Record Number: [id number] Patient Account Number: [id number] Date of Birth/Sex: Jan 26, 1946 (73 y.o. F) Treating RN: Montey Hora Primary Care Phelix Fudala: [name] Other Clinician: Referring Meara Wiechman: [name] Treating Alaya Iverson/Extender: Melburn Hake, HOYT Weeks in Treatment: 6 Encounter Discharge Information Items Post Procedure Vitals Discharge Condition: Stable Temperature (F): 98.0 Ambulatory Status: Ambulatory Pulse (bpm): 80 Discharge Destination: Home Respiratory Rate  (breaths/min): 16 Transportation: Private Auto Blood Pressure (mmHg): 149/74 Accompanied By: self Schedule Follow-up Appointment: Yes Clinical Summary of Care: Electronic Signature(s) Signed: 08/04/2019 4:44:40 PM By: Montey Hora Entered By: Montey Hora on 08/04/2019 09:20:27 Christina Blackburn ([id number]) -------------------------------------------------------------------------------- Lower Extremity Assessment Details Patient Name: Christina Blackburn Date of Service: 08/04/2019 8:30 AM Medical Record Number: [id number] Patient Account Number: [id number] Date of Birth/Sex: 05-29-1946 (73 y.o. F) Treating RN: Army Melia Primary Care Loraine Freid: [name] Other Clinician: Referring Ayson Cherubini: [name] Treating Annelle Behrendt/Extender: Melburn Hake, HOYT Weeks in Treatment: 6 Edema Assessment Assessed: [Left: No] [Right: No] Edema: [Left: N] [Right: o] Vascular Assessment Pulses: Dorsalis Pedis Palpable: [Left:Yes] Electronic Signature(s) Signed: 08/04/2019 9:57:59 AM By: Army Melia Entered By: Army Melia on 08/04/2019 08:37:07 Christina Blackburn ([id number]) -------------------------------------------------------------------------------- Multi Wound Chart Details Patient Name: Christina Blackburn Date of Service: 08/04/2019 8:30 AM Medical Record Number: [id number] Patient Account Number: [id number] Date of Birth/Sex: November 19, 1945 (73 y.o. 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 N/A N/A Comorbid History: Chronic Obstructive N/A N/A Pulmonary Disease (COPD), Coronary Artery Disease, Hypertension, Type II Diabetes Date Acquired: 03/16/2019 N/A N/A Weeks of Treatment: 6 N/A N/A Wound Status: Open N/A N/A Measurements L x W x D 3.9x4.4x0.2 N/A N/A (cm) Area (cm) : 13.477 N/A N/A Volume (cm) : 2.695 N/A N/A % Reduction in Area: -572.80% N/A N/A % Reduction in Volume: -1247.50% N/A N/A Classification: Grade 1 N/A N/A Exudate Amount: Medium N/A N/A Exudate Type: Serous N/A N/A Exudate Color: amber N/A N/A Wound Margin: Flat and Intact N/A N/A Granulation Amount: Small (1-33%)  N/A N/A Necrotic Amount: Large (67-100%) N/A N/A Necrotic Tissue: Eschar, Adherent Slough N/A N/A Exposed Structures: Fat Layer (Subcutaneous N/A N/A Tissue) Exposed: Yes Fascia: No Christina Blackburn, Christina Blackburn ([id number]) Tendon: No Muscle: No Joint: No Bone: No Epithelialization: None N/A N/A Treatment Notes Electronic Signature(s) Signed: 08/04/2019 4:44:40 PM By: Montey Hora Entered By: Montey Hora on 08/04/2019 09:11:36 Christina Blackburn ([id number]) -------------------------------------------------------------------------------- [place] Details Patient Name: Christina Blackburn Date of Service: 08/04/2019 8:30 AM Medical Record Number: [id number] Patient Account Number: [id number] Date of Birth/Sex: 1946-02-17 (73 y.o. F) Treating RN: Montey Hora Primary Care Devone Bonilla: [name] Other Clinician: Referring Adriel Kessen: [name] Treating Lamees Gable/Extender: Melburn Hake, HOYT Weeks in Treatment: 6 Active Inactive Abuse / Safety / Falls / Self Care Management Nursing Diagnoses: Potential for falls Goals: Patient will remain injury free related to falls Date Initiated: 06/19/2019 Target Resolution Date: 09/19/2019 Goal Status: Active Interventions: Assess fall risk on  admission and as needed Notes: Necrotic Tissue Nursing Diagnoses: Impaired tissue integrity related to necrotic/devitalized tissue Goals: Necrotic/devitalized tissue will be minimized in the wound bed Date Initiated: 06/19/2019 Target Resolution Date: 09/19/2019 Goal Status: Active Interventions: Provide education on necrotic tissue and debridement process Notes: Orientation to the Wound Care Program Nursing Diagnoses: Knowledge deficit related to the wound healing center program Goals: Patient/caregiver will verbalize understanding of the [facility] Date Initiated: 06/19/2019 Target Resolution Date: 09/19/2019 Goal Status: Active Interventions: Provide education on orientation to the wound center Christina Blackburn ([id number]) Notes: Pain, Acute or Chronic Nursing Diagnoses: Pain, acute or chronic: actual or potential Goals: Patient/caregiver will verbalize comfort level met Date Initiated: 06/19/2019 Target Resolution Date: 09/19/2019 Goal Status: Active Interventions: Complete pain assessment as per visit requirements Notes: Wound/Skin Impairment Nursing Diagnoses: Impaired tissue integrity Goals: Ulcer/skin breakdown will heal within 14 weeks Date Initiated: 06/19/2019 Target Resolution Date: 09/19/2019 Goal Status: Active Interventions: Assess patient/caregiver ability to obtain necessary supplies Assess patient/caregiver ability to perform ulcer/skin care regimen upon admission and as needed Assess ulceration(s) every visit Notes: Electronic Signature(s) Signed: 08/04/2019 4:44:40 PM By: Montey Hora Entered By: Montey Hora on 08/04/2019 09:11:01 Christina Blackburn ([id number]) -------------------------------------------------------------------------------- Pain Assessment Details Patient Name: Christina Blackburn Date of Service: 08/04/2019 8:30 AM Medical Record Number: [id number] Patient Account Number: [id number] Date of Birth/Sex:  09/06/1946 (73 y.o. F) Treating RN: Army Melia Primary Care Mosie Angus: [name] Other Clinician: Referring Kailyn Dubie: [name] Treating Carmellia Kreisler/Extender: Melburn Hake, HOYT Weeks in Treatment: 6 Active Problems Location of Pain Severity and Description of Pain Patient Has Paino Yes Site Locations Pain Location: Pain in Ulcers Rate the pain. Current Pain Level: 4 Pain Management and Medication Current Pain Management: Electronic Signature(s) Signed: 08/04/2019 9:57:59 AM By: Army Melia Entered By: Army Melia on 08/04/2019 08:33:22 Christina Blackburn ([id number]) -------------------------------------------------------------------------------- Patient/Caregiver Education Details Patient Name: Christina Blackburn Date of Service: 08/04/2019 8:30 AM Medical Record Number: [id number] Patient Account Number: [id number] Date of Birth/Gender: Mar 07, 1946 (73 y.o. F) Treating RN: Montey Hora Primary Care Physician: [name] Other Clinician: Referring Physician: [name] Treating Physician/Extender: Sharalyn Ink in Treatment: 6 Education Assessment Education Provided To: Patient Education Topics Provided Wound/Skin Impairment: Handouts: Other: wound care as ordered Methods: Demonstration, Explain/Verbal Responses: State content correctly Electronic Signature(s) Signed: 08/04/2019 4:44:40 PM By: Montey Hora Entered By: Montey Hora on 08/04/2019 09:15:57 Christina Blackburn ([id number]) -------------------------------------------------------------------------------- Wound Assessment Details Patient Name: Christina Blackburn Date of Service: 08/04/2019 8:30 AM Medical Record Number: [id number] Patient Account Number: [id number]  Date of Birth/Sex: May 31, 1946 (73 y.o. F) Treating RN: Army Melia Primary Care Yosgart Pavey: [name] Other Clinician: Referring Jaima Janney: [name] Treating Blayton Huttner/Extender: Melburn Hake, HOYT Weeks in  Treatment: 6 Wound Status Wound Number: 4 Primary Diabetic Wound/Ulcer of the Lower Extremity Etiology: Wound Location: Left Lower Leg - Posterior Secondary Arterial Insufficiency Ulcer Wounding Event: Gradually Appeared Etiology: Date Acquired: 03/16/2019 Wound Open Weeks Of Treatment: 6 Status: Clustered Wound: No Comorbid Chronic Obstructive Pulmonary Disease History: (COPD), Coronary Artery Disease, Hypertension, Type II Diabetes Photos Wound Measurements Length: (cm) 3.9 % Reduction Width: (cm) 4.4 % Reduction Depth: (cm) 0.2 Epitheliali Area: (cm) 13.477 Tunneling: Volume: (cm) 2.695 [place]minin in Area: -572.8% in Volume: -1247.5% zation: None No g: No Wound Description Classification: Grade 1 Foul Odor A Wound Margin: Flat and Intact Slough/Fibr Exudate Amount: Medium Exudate Type: Serous Exudate Color: amber fter Cleansing: No ino Yes Wound Bed Granulation Amount: Small (1-33%) Exposed Structure Necrotic Amount: Large (67-100%) Fascia Exposed: No Necrotic Quality: Eschar, Adherent Slough Fat Layer (Subcutaneous Tissue) Exposed: Yes Tendon Exposed: No Muscle Exposed: No Joint Exposed: No Bone Exposed: No Christina Blackburn, Christina Blackburn ([id number]) Treatment Notes Wound #4 (Left, Posterior Lower Leg) Notes santyl, moistened gauze, abd and netting Electronic Signature(s) Signed: 08/04/2019 9:57:59 AM By: Army Melia Entered By: Army Melia on 08/04/2019 08:36:26 Christina Blackburn ([id number]) -------------------------------------------------------------------------------- Vitals Details Patient Name: Christina Blackburn Date of Service: 08/04/2019 8:30 AM Medical Record Number: [id number] Patient Account Number: [id number] Date of Birth/Sex: 06-Apr-1946 (73 y.o. F) Treating RN: Army Melia Primary Care Boubacar Lerette: [name] Other Clinician: Referring Kage Willmann: [name] Treating Krysti Hickling/Extender: Melburn Hake, HOYT Weeks in Treatment: 6 Vital  Signs Time Taken: 08:33 Temperature (F): 98.0 Height (in): 59 Pulse (bpm): 80 Weight (lbs): 170 Respiratory Rate (breaths/min): 16 Body Mass Index (BMI): 34.3 Blood Pressure (mmHg): 149/74 Reference Range: 80 - 120 mg / dl Electronic Signature(s) Signed: 08/04/2019 9:57:59 AM By: Army Melia Entered By: Army Melia on 08/04/2019 08:33:51

## 2019-08-04 NOTE — Progress Notes (Addendum)
ASLEAN, BISAILLON ([id number]) Visit Report for 08/04/2019 Chief Complaint Document Details Patient Name: Christina Blackburn, Christina Blackburn Date of Service: 08/04/2019 8:30 AM Medical Record Number: [id number] Patient Account Number: [id number] Date of Birth/Sex: [birth year] (73 y.o. F) Treating RN: Montey Hora Primary Care Provider: [name] Other Clinician: Referring Provider: [name] Treating Provider/Extender: Melburn Hake, HOYT Weeks in Treatment: 6 Information Obtained from: Patient Chief Complaint Left LE Ulcer Electronic Signature(s) Signed: 08/04/2019 8:34:54 AM By: Worthy Keeler PA-C Entered By: Worthy Keeler on 08/04/2019 08:34:54 Christina Blackburn ([id number]) -------------------------------------------------------------------------------- Debridement Details Patient Name: Christina Blackburn Date of Service: 08/04/2019 8:30 AM Medical Record Number: [id number] Patient Account Number: [id number] Date of Birth/Sex: 03/12/1946 (73 y.o. F) Treating RN: Montey Hora Primary Care Provider: [name] Other Clinician: Referring Provider: [name] Treating Provider/Extender: Melburn Hake, HOYT Weeks in Treatment: 6 Debridement Performed for Wound #4 Left,Posterior Lower Leg Assessment: Performed By: Physician STONE III, HOYT E., PA-C Debridement Type: Debridement Severity of Tissue Pre Fat layer exposed Debridement: Level of Consciousness (Pre- Awake and Alert procedure): Pre-procedure Verification/Time Yes - 09:12 Out Taken: Start Time: 09:12 Pain Control: Lidocaine 4% Topical Solution Total Area Debrided (L x W): 3.9 (cm) x 4.4 (cm) = 17.16 (cm) Tissue and other material Viable, Non-Viable, Eschar, Slough, Subcutaneous, Slough debrided: Level: Skin/Subcutaneous Tissue Debridement Description: Excisional Instrument: Curette Bleeding: Minimum Hemostasis Achieved: Pressure End Time: 09:18 Procedural Pain: 0 Post Procedural Pain: 0 Response  to Treatment: Procedure was tolerated well Level of Consciousness Awake and Alert (Post-procedure): Post Debridement Measurements of Total Wound Length: (cm) 3.9 Width: (cm) 4.4 Depth: (cm) 0.3 Volume: (cm) 4.043 Character of Wound/Ulcer Post Debridement: Improved Severity of Tissue Post Debridement: Fat layer exposed Post Procedure Diagnosis Same as Pre-procedure Electronic Signature(s) Signed: 08/04/2019 4:44:40 PM By: Montey Hora Signed: 08/04/2019 7:13:08 PM By: Worthy Keeler PA-C Entered By: Montey Hora on 08/04/2019 09:18:14 Christina Blackburn ([id number]) -------------------------------------------------------------------------------- HPI Details Patient Name: Christina Blackburn Date of Service: 08/04/2019 8:30 AM Medical Record Number: [id number] Patient Account Number: [id number] Date of Birth/Sex: Oct 30, 1945 (73 y.o. F) Treating RN: Montey Hora Primary Care Provider: [name] Other Clinician: Referring Provider: [name] Treating Provider/Extender: Melburn Hake, HOYT Weeks in Treatment: 6 History of Present Illness HPI Description: 06/19/2019 on evaluation today patient presents today for an initial evaluation in our clinic regarding a problem that first began in June 2020. She states that she saw a red tender spot on her lower extremity. Subsequently she saw dermatology towards the beginning to middle of July. Following this time she actually ended up going to see her primary care provider on July 31 at which point she was given an antibiotic cream she states. On August 28 she actually ended up going to urgent care because this was getting worse and was placed on doxycycline at that time. This was the first round of doxycycline. On 06/17/2019 she was actually seen in the emergency department at [facility] where she was given IV vancomycin and recommended to continue with Augmentin as well as doxycycline on an outpatient basis following. She did have an  x-ray performed at [facility] according to what she tells me today and this revealed that there was no signs of bone involvement with regard to infection. This obviously is excellent news. Right now she tells me she still having discomfort. Unfortunately she also has significant issues with peripheral vascular disease which is also somewhat unfortunate. Especially in light of the fact that the wound is obviously on her  lower extremity. She actually has an appointment to see vascular at [facility] for second opinion as well with regard to this that is not till sometime around the 21st of this month September 2020. The patient does have a history of diabetes mellitus type 2 along with again peripheral vascular disease. Her most recent TBI's revealed that her ABIs were noncompressible bilaterally with a right TBI of 0.35 and a left TBI of 0.23 obviously she does have fairly significant peripheral vascular disease in my opinion. This is post intervention including stent placement. 06/30/19 on evaluation today patient appears to be doing fairly well in regard to her wound on the left posterior lower extremity. She has been tolerating the dressing changes without complication which is good news. Fortunately there's no signs of active infection. She actually does have an appointment with vascular surgery [facility] later this month. 07/14/2019 on evaluation today actually did see the patient back for reevaluation. Unfortunately the wound actually appears to be getting larger not better with everything that we have been trying up to this point. She did see vascular at [facility] last week. They unfortunately stated that they did not fill in their opinion that the patient would benefit from revascularization attempts as far as getting this wound to heal. It was noted in the note that if she is not healing appropriately and has any worsening of the wound that she may come back for revascularization at that point but they were not confident  that that would make a difference in the wound healing. Nonetheless they did tell her that they felt like the wound would be able to heal but just would take a very long time. The patient obviously is somewhat frustrated at this point which I completely understand. The wound also is measuring larger today compared to last week. 07/21/2019 on evaluation today patient appears to be doing more poorly at this time with regard to her left lower extremity ulcer. She has been tolerating the dressing changes without complication. With that being said I really do not see any significant improvement at this time. There may also be some infection going on we did finally get the results of the culture back and it does show that she has 2 bacteria potentially causing issues coupled with the erythema and pain that she is experiencing I think that this needs to be addressed. As of right now all of the vascular specialist including the second opinion that she had both agree that they feel she can heal with her current blood flow without any need for intervention. She was told that she was "at 50% flow reaching the wound". 07/28/2019 on evaluation today patient appears to be doing well with regard to her left leg ulcer as compared to last week. The measurements are slightly larger but at the same time she seems to be doing better with regard to the fact that she is having just a little itching much less pain much less redness and overall she is much happier with what she is seeing. No fevers, chills, nausea, vomiting, or diarrhea. 08/04/2019 on evaluation today patient appears to be doing well with regard to her wound on her leg at this time in regard to there being no signs of infection. With that being said she is having continued discomfort especially she tells me at nighttime. There does not appear to be any signs of active infection systemically either which is good news. There is necrotic tissue noted on the  surface of the wound  I am going to see if we can potentially debride some of this away today. Christina Blackburn, Christina Blackburn ([id number]) Electronic Signature(s) Signed: 08/04/2019 6:53:29 PM By: Worthy Keeler PA-C Entered By: Worthy Keeler on 08/04/2019 18:53:29 Christina Blackburn ([id number]) -------------------------------------------------------------------------------- Physical Exam Details Patient Name: Christina Blackburn Date of Service: 08/04/2019 8:30 AM Medical Record Number: [id number] Patient Account Number: [id number] Date of Birth/Sex: 1946/08/24 (73 y.o. F) Treating RN: Montey Hora Primary Care Provider: [name] Other Clinician: Referring Provider: [name] Treating Provider/Extender: Melburn Hake, HOYT Weeks in Treatment: 6 Notes I did actually perform sharp debridement today to remove a significant portion of the necrotic material from the surface of the wound and the patient tolerated this today without complication. Post debridement the wound bed appears to be doing better and again I am pleased with the fact that again there is no signs of any additional breakdown the wound is not measuring smaller even if it is not significantly larger I feel like were really about the same. She really did not have significant bleeding during the office visit today. Electronic Signature(s) Signed: 08/04/2019 6:54:44 PM By: Worthy Keeler PA-C Entered By: Worthy Keeler on 08/04/2019 18:54:44 Christina Blackburn ([id number]) -------------------------------------------------------------------------------- Physician Orders Details Patient Name: Christina Blackburn Date of Service: 08/04/2019 8:30 AM Medical Record Number: [id number] Patient Account Number: [id number] Date of Birth/Sex: March 20, 1946 (73 y.o. F) Treating RN: Montey Hora Primary Care Provider: [name] Other Clinician: Referring Provider: [name] Treating Provider/Extender: Melburn Hake,  HOYT Weeks in Treatment: 6 Verbal / Phone Orders: No Diagnosis Coding ICD-10 Coding Code Description E11.622 Type 2 diabetes mellitus with other skin ulcer L97.828 Non-pressure chronic ulcer of other part of left lower leg with other specified severity I73.89 Other specified peripheral vascular diseases Wound Cleansing Wound #4 Left,Posterior Lower Leg o Clean wound with Normal Saline. o Dial antibacterial soap, wash wounds, rinse and pat dry prior to dressing wounds o May Shower, gently pat wound dry prior to applying new dressing. Primary Wound Dressing Wound #4 Left,Posterior Lower Leg o Santyl Ointment Secondary Dressing Wound #4 Left,Posterior Lower Leg o ABD pad - stretch netting to secure o Saline moistened gauze Dressing Change Frequency Wound #4 Left,Posterior Lower Leg o Change dressing every day. Follow-up Appointments Wound #4 Left,Posterior Lower Leg o Return Appointment in 1 week. - Friday 08/14/19 Medications-please add to medication list. Wound #4 Left,Posterior Lower Leg o Santyl Enzymatic Ointment o Other: - Americaine 20% benzocaine spray Electronic Signature(s) Signed: 08/04/2019 4:44:40 PM By: Montey Hora Signed: 08/04/2019 7:13:08 PM By: Worthy Keeler PA-C Entered By: Montey Hora on 08/04/2019 09:19:55 Christina Blackburn ([id number]) Christina Blackburn ([id number]) -------------------------------------------------------------------------------- Problem List Details Patient Name: Christina Blackburn Date of Service: 08/04/2019 8:30 AM Medical Record Number: [id number] Patient Account Number: [id number] Date of Birth/Sex: 1945/11/18 (73 y.o. F) Treating RN: Montey Hora Primary Care Provider: [name] Other Clinician: Referring Provider: [name] Treating Provider/Extender: Melburn Hake, HOYT Weeks in Treatment: 6 Active Problems ICD-10 Evaluated Encounter Code Description Active Date Today  Diagnosis E11.622 Type 2 diabetes mellitus with other skin ulcer 06/19/2019 No Yes L97.828 Non-pressure chronic ulcer of other part of left lower leg with 06/19/2019 No Yes other specified severity I73.89 Other specified peripheral vascular diseases 06/19/2019 No Yes Inactive Problems Resolved Problems Electronic Signature(s) Signed: 08/04/2019 8:34:49 AM By: Worthy Keeler PA-C Entered By: Worthy Keeler on 08/04/2019 08:34:48 Christina Blackburn ([id number]) -------------------------------------------------------------------------------- Progress Note Details Patient Name: Christina Blackburn  Date of Service: 08/04/2019 8:30 AM Medical Record Number: [id number] Patient Account Number: [id number] Date of Birth/Sex: 1946/09/02 (73 y.o. F) Treating RN: Montey Hora Primary Care Provider: [name] Other Clinician: Referring Provider: [name] Treating Provider/Extender: Melburn Hake, HOYT Weeks in Treatment: 6 Subjective Chief Complaint Information obtained from Patient Left LE Ulcer History of Present Illness (HPI) 06/19/2019 on evaluation today patient presents today for an initial evaluation in our clinic regarding a problem that first began in June 2020. She states that she saw a red tender spot on her lower extremity. Subsequently she saw dermatology towards the beginning to middle of July. Following this time she actually ended up going to see her primary care provider on July 31 at which point she was given an antibiotic cream she states. On August 28 she actually ended up going to urgent care because this was getting worse and was placed on doxycycline at that time. This was the first round of doxycycline. On 06/17/2019 she was actually seen in the emergency department at [facility] where she was given IV vancomycin and recommended to continue with Augmentin as well as doxycycline on an outpatient basis following. She did have an x-ray performed at [facility] according to what she tells  me today and this revealed that there was no signs of bone involvement with regard to infection. This obviously is excellent news. Right now she tells me she still having discomfort. Unfortunately she also has significant issues with peripheral vascular disease which is also somewhat unfortunate. Especially in light of the fact that the wound is obviously on her lower extremity. She actually has an appointment to see vascular at [facility] for second opinion as well with regard to this that is not till sometime around the 21st of this month September 2020. The patient does have a history of diabetes mellitus type 2 along with again peripheral vascular disease. Her most recent TBI's revealed that her ABIs were noncompressible bilaterally with a right TBI of 0.35 and a left TBI of 0.23 obviously she does have fairly significant peripheral vascular disease in my opinion. This is post intervention including stent placement. 06/30/19 on evaluation today patient appears to be doing fairly well in regard to her wound on the left posterior lower extremity. She has been tolerating the dressing changes without complication which is good news. Fortunately there's no signs of active infection. She actually does have an appointment with vascular surgery [facility] later this month. 07/14/2019 on evaluation today actually did see the patient back for reevaluation. Unfortunately the wound actually appears to be getting larger not better with everything that we have been trying up to this point. She did see vascular at [facility] last week. They unfortunately stated that they did not fill in their opinion that the patient would benefit from revascularization attempts as far as getting this wound to heal. It was noted in the note that if she is not healing appropriately and has any worsening of the wound that she may come back for revascularization at that point but they were not confident that that would make a difference in the wound  healing. Nonetheless they did tell her that they felt like the wound would be able to heal but just would take a very long time. The patient obviously is somewhat frustrated at this point which I completely understand. The wound also is measuring larger today compared to last week. 07/21/2019 on evaluation today patient appears to be doing more poorly at this time with regard to  her left lower extremity ulcer. She has been tolerating the dressing changes without complication. With that being said I really do not see any significant improvement at this time. There may also be some infection going on we did finally get the results of the culture back and it does show that she has 2 bacteria potentially causing issues coupled with the erythema and pain that she is experiencing I think that this needs to be addressed. As of right now all of the vascular specialist including the second opinion that she had both agree that they feel she can heal with her current blood flow without any need for intervention. She was told that she was "at 50% flow reaching the wound". 07/28/2019 on evaluation today patient appears to be doing well with regard to her left leg ulcer as compared to last week. The measurements are slightly larger but at the same time she seems to be doing better with regard to the fact that she is having just a little itching much less pain much less redness and overall she is much happier with what she is seeing. No fevers, chills, nausea, vomiting, or diarrhea. Christina Blackburn, Christina Blackburn ([id number]) 08/04/2019 on evaluation today patient appears to be doing well with regard to her wound on her leg at this time in regard to there being no signs of infection. With that being said she is having continued discomfort especially she tells me at nighttime. There does not appear to be any signs of active infection systemically either which is good news. There is necrotic tissue noted on the surface of the  wound I am going to see if we can potentially debride some of this away today. Patient History Information obtained from Patient. Family History Cancer - Siblings,Mother, Hypertension - Father, Kidney Disease - Father, Lung Disease - Father, Stroke - Maternal Grandparents, No family history of Diabetes, Heart Disease, Hereditary Spherocytosis, Seizures, Thyroid Problems, Tuberculosis. Social History Former smoker - ended on 10/15/2006, Marital Status - Married, Alcohol Use - Never, Drug Use - No History, Caffeine Use - Daily. Medical History Eyes Denies history of Cataracts, Glaucoma, Optic Neuritis Ear/Nose/Mouth/Throat Denies history of Chronic sinus problems/congestion, Middle ear problems Hematologic/Lymphatic Denies history of Anemia, Hemophilia, Human Immunodeficiency Virus, Lymphedema, Sickle Cell Disease Respiratory Patient has history of Chronic Obstructive Pulmonary Disease (COPD) Denies history of Aspiration, Asthma, Pneumothorax, Sleep Apnea, Tuberculosis Cardiovascular Patient has history of Coronary Artery Disease - Stent placed 2009, Hypertension Denies history of Angina, Arrhythmia, Congestive Heart Failure, Deep Vein Thrombosis, Hypotension, Myocardial Infarction, Peripheral Arterial Disease, Peripheral Venous Disease, Phlebitis, Vasculitis Gastrointestinal Denies history of Cirrhosis , Colitis, Crohn s, Hepatitis A, Hepatitis B, Hepatitis C Endocrine Patient has history of Type II Diabetes - 2004 Denies history of Type I Diabetes Genitourinary Denies history of End Stage Renal Disease Immunological Denies history of Lupus Erythematosus, Raynaud s, Scleroderma Integumentary (Skin) Denies history of History of Burn, History of pressure wounds Musculoskeletal Denies history of Gout, Rheumatoid Arthritis, Osteoarthritis, Osteomyelitis Neurologic Denies history of Dementia, Neuropathy, Quadriplegia, Paraplegia, Seizure Disorder Oncologic Denies history of Received  Chemotherapy, Received Radiation Psychiatric Denies history of Anorexia/bulimia, Confinement Anxiety Hospitalization/Surgery History - Stent placed. - gall bladder. - hysterectomy. - c-section x2. - ARM 2 stents placed 10/2018. Review of Systems (ROS) Constitutional Symptoms (General Health) Denies complaints or symptoms of Fatigue, Fever, Chills, Marked Weight Change. Respiratory Denies complaints or symptoms of Chronic or frequent coughs, Shortness of Breath. Christina Blackburn, Christina Blackburn ([id number]) Cardiovascular Denies complaints or symptoms of Chest pain,  LE edema. Psychiatric Denies complaints or symptoms of Anxiety, Claustrophobia. Objective Constitutional Vitals Time Taken: 8:33 AM, Height: 59 in, Weight: 170 lbs, BMI: 34.3, Temperature: 98.0 F, Pulse: 80 bpm, Respiratory Rate: 16 breaths/min, Blood Pressure: 149/74 mmHg. Integumentary (Hair, Skin) Wound #4 status is Open. Original cause of wound was Gradually Appeared. The wound is located on the Left,Posterior Lower Leg. The wound measures 3.9cm length x 4.4cm width x 0.2cm depth; 13.477cm^2 area and 2.695cm^3 volume. There is Fat Layer (Subcutaneous Tissue) Exposed exposed. There is no tunneling or undermining noted. There is a medium amount of serous drainage noted. The wound margin is flat and intact. There is small (1-33%) granulation within the wound bed. There is a large (67-100%) amount of necrotic tissue within the wound bed including Eschar and Adherent Slough. Assessment Active Problems ICD-10 Type 2 diabetes mellitus with other skin ulcer Non-pressure chronic ulcer of other part of left lower leg with other specified severity Other specified peripheral vascular diseases Procedures Wound #4 Pre-procedure diagnosis of Wound #4 is a Diabetic Wound/Ulcer of the Lower Extremity located on the Left,Posterior Lower Leg .Severity of Tissue Pre Debridement is: Fat layer exposed. There was a Excisional Skin/Subcutaneous  Tissue Debridement with a total area of 17.16 sq cm performed by STONE III, HOYT E., PA-C. With the following instrument(s): Curette to remove Viable and Non-Viable tissue/material. Material removed includes Eschar, Subcutaneous Tissue, and Slough after achieving pain control using Lidocaine 4% Topical Solution. No specimens were taken. A time out was conducted at 09:12, prior to the start of the procedure. A Minimum amount of bleeding was controlled with Pressure. The procedure was tolerated well with a pain level of 0 throughout and a pain level of 0 following the procedure. Post Debridement Measurements: 3.9cm length x 4.4cm width x 0.3cm depth; 4.043cm^3 volume. Character of Wound/Ulcer Post Debridement is improved. Severity of Tissue Post Debridement is: Fat layer exposed. Post procedure Diagnosis Wound #4: Same as Pre-Procedure Christina Blackburn, Christina Blackburn ([id number]) Plan Wound Cleansing: Wound #4 Left,Posterior Lower Leg: Clean wound with Normal Saline. Dial antibacterial soap, wash wounds, rinse and pat dry prior to dressing wounds May Shower, gently pat wound dry prior to applying new dressing. Primary Wound Dressing: Wound #4 Left,Posterior Lower Leg: Santyl Ointment Secondary Dressing: Wound #4 Left,Posterior Lower Leg: ABD pad - stretch netting to secure Saline moistened gauze Dressing Change Frequency: Wound #4 Left,Posterior Lower Leg: Change dressing every day. Follow-up Appointments: Wound #4 Left,Posterior Lower Leg: Return Appointment in 1 week. - Friday 08/14/19 Medications-please add to medication list.: Wound #4 Left,Posterior Lower Leg: Santyl Enzymatic Ointment Other: - Americaine 20% benzocaine spray 1. I would recommend at this time that we go ahead and continue with the Santyl which hopefully will still continue to help clear up the necrotic tissue on the surface of the wound the patient is in agreement with that plan. 2., Recommend as well that we continue with  changing the dressing on an every day basis I think this will help to keep the area clean of significant necrotic debris buildup. 3. If she has any concerns or issues she will let me know. Otherwise we will plan to see her next week and then following that regular do telehealth [place] she will actually be at the [facility] for the next month. If anything changes I need to see her urgently we can always do so otherwise will consider a telehealth visits in the interim. We will see patient back for reevaluation  in 1 week here in the clinic. If anything worsens or changes patient will contact our office for additional recommendations. Electronic Signature(s) Signed: 08/04/2019 6:57:11 PM By: Worthy Keeler PA-C Entered By: Worthy Keeler on 08/04/2019 18:57:11 Christina Blackburn ([id number]) -------------------------------------------------------------------------------- ROS/PFSH Details Patient Name: Christina Blackburn Date of Service: 08/04/2019 8:30 AM Medical Record Number: [id number] Patient Account Number: [id number] Date of Birth/Sex: [birth year] (73 y.o. F) Treating RN: Montey Hora Primary Care Provider: [name] Other Clinician: Referring Provider: [name] Treating Provider/Extender: Melburn Hake, HOYT Weeks in Treatment: 6 Information Obtained From Patient Constitutional Symptoms (General Health) Complaints and Symptoms: Negative for: Fatigue; Fever; Chills; Marked Weight Change Respiratory Complaints and Symptoms: Negative for: Chronic or frequent coughs; Shortness of Breath Medical History: Positive for: Chronic Obstructive Pulmonary Disease (COPD) Negative for: Aspiration; Asthma; Pneumothorax; Sleep Apnea; Tuberculosis Cardiovascular Complaints and Symptoms: Negative for: Chest pain; LE edema Medical History: Positive for: Coronary Artery Disease - Stent placed 2009; Hypertension Negative for: Angina; Arrhythmia; Congestive Heart  Failure; Deep Vein Thrombosis; Hypotension; Myocardial Infarction; Peripheral Arterial Disease; Peripheral Venous Disease; Phlebitis; Vasculitis Psychiatric Complaints and Symptoms: Negative for: Anxiety; Claustrophobia Medical History: Negative for: Anorexia/bulimia; Confinement Anxiety Eyes Medical History: Negative for: Cataracts; Glaucoma; Optic Neuritis Ear/Nose/Mouth/Throat Medical History: Negative for: Chronic sinus problems/congestion; Middle ear problems Hematologic/Lymphatic Medical History: Negative for: Anemia; Hemophilia; Human Immunodeficiency Virus; Lymphedema; Sickle Cell Disease Christina Blackburn, Christina Blackburn ([id number]) Gastrointestinal Medical History: Negative for: Cirrhosis ; Colitis; Crohnos; Hepatitis A; Hepatitis B; Hepatitis C Endocrine Medical History: Positive for: Type II Diabetes - 2004 Negative for: Type I Diabetes Time with diabetes: 15 years Treated with: Oral agents Blood sugar tested every day: Yes Tested : 2-3 times daily Blood sugar testing results: Breakfast: 84 Genitourinary Medical History: Negative for: End Stage Renal Disease Immunological Medical History: Negative for: Lupus Erythematosus; Raynaudos; Scleroderma Integumentary (Skin) Medical History: Negative for: History of Burn; History of pressure wounds Musculoskeletal Medical History: Negative for: Gout; Rheumatoid Arthritis; Osteoarthritis; Osteomyelitis Neurologic Medical History: Negative for: Dementia; Neuropathy; Quadriplegia; Paraplegia; Seizure Disorder Oncologic Medical History: Negative for: Received Chemotherapy; Received Radiation Immunizations Pneumococcal Vaccine: Received Pneumococcal Vaccination: Yes Tetanus Vaccine: Last tetanus shot: 06/19/2013 Implantable Devices None Hospitalization / Surgery History Type of Hospitalization/Surgery Stent placed KHAMILA, Christina Blackburn ([id number]) gall bladder hysterectomy c-section x2 ARM 2 stents placed 10/2018 Family and  Social History Cancer: Yes - Siblings,Mother; Diabetes: No; Heart Disease: No; Hereditary Spherocytosis: No; Hypertension: Yes - Father; Kidney Disease: Yes - Father; Lung Disease: Yes - Father; Seizures: No; Stroke: Yes - Maternal Grandparents; Thyroid Problems: No; Tuberculosis: No; Former smoker - ended on 10/15/2006; Marital Status - Married; Alcohol Use: Never; Drug Use: No History; Caffeine Use: Daily; Financial Concerns: No; Food, Clothing or Shelter Needs: No; Support System Lacking: No; Transportation Concerns: No Physician Affirmation I have reviewed and agree with the above information. Electronic Signature(s) Signed: 08/04/2019 7:13:08 PM By: Worthy Keeler PA-C Signed: 08/05/2019 4:26:39 PM By: Montey Hora Entered By: Worthy Keeler on 08/04/2019 18:54:27 Christina Blackburn ([id number]) -------------------------------------------------------------------------------- SuperBill Details Patient Name: Christina Blackburn Date of Service: 08/04/2019 Medical Record Number: [id number] Patient Account Number: [id number] Date of Birth/Sex: Sep 24, 1946 (73 y.o. F) Treating RN: Montey Hora Primary Care Provider: [name] Other Clinician: Referring Provider: [name] Treating Provider/Extender: Melburn Hake, HOYT Weeks in Treatment: 6 Diagnosis Coding ICD-10 Codes Code Description E11.622 Type 2 diabetes mellitus with other skin ulcer L97.828 Non-pressure chronic ulcer of other part of left lower leg with other specified severity I73.89  Other specified peripheral vascular diseases Facility Procedures CPT4 Code Description: [id number] - DEB SUBQ TISSUE 20 SQ CM/< ICD-10 Diagnosis Description L97.828 Non-pressure chronic ulcer of other part of left lower leg with Modifier: other specified Quantity: 1 severity Physician Procedures CPT4 Code Description: [id number] - WC PHYS SUBQ TISS 20 SQ CM ICD-10 Diagnosis Description L97.828 Non-pressure chronic ulcer of  other part of left lower leg with Modifier: other specified s Quantity: 1 everity Electronic Signature(s) Signed: 08/04/2019 6:57:54 PM By: Worthy Keeler PA-C Entered By: Worthy Keeler on 08/04/2019 18:57:53

## 2019-08-13 DIAGNOSIS — I5032 Chronic diastolic (congestive) heart failure: Secondary | ICD-10-CM | POA: Diagnosis not present

## 2019-08-13 DIAGNOSIS — E7800 Pure hypercholesterolemia, unspecified: Secondary | ICD-10-CM | POA: Diagnosis not present

## 2019-08-13 DIAGNOSIS — I2510 Atherosclerotic heart disease of native coronary artery without angina pectoris: Secondary | ICD-10-CM | POA: Diagnosis not present

## 2019-08-13 DIAGNOSIS — I10 Essential (primary) hypertension: Secondary | ICD-10-CM | POA: Diagnosis not present

## 2019-08-14 ENCOUNTER — Other Ambulatory Visit: Payer: Self-pay

## 2019-08-14 ENCOUNTER — Encounter: Payer: PPO | Admitting: Internal Medicine

## 2019-08-14 DIAGNOSIS — L97222 Non-pressure chronic ulcer of left calf with fat layer exposed: Secondary | ICD-10-CM | POA: Diagnosis not present

## 2019-08-14 DIAGNOSIS — E11622 Type 2 diabetes mellitus with other skin ulcer: Secondary | ICD-10-CM | POA: Diagnosis not present

## 2019-08-14 DIAGNOSIS — M79662 Pain in left lower leg: Secondary | ICD-10-CM | POA: Diagnosis not present

## 2019-08-14 NOTE — Progress Notes (Signed)
[name], [name] ([id number]) Visit Report for 08/14/2019 Debridement Details Patient Name: Christina Blackburn, Christina Blackburn. Date of Service: 08/14/2019 8:30 AM Medical Record Number: [id number] Patient Account Number: [id number] Date of Birth/Sex: 10-07-1946 (73 y.o. F) Treating RN: Montey Hora Primary Care Provider: [name] Other Clinician: Referring Provider: [name] Treating Provider/Extender: Tito Dine in Treatment: 8 Debridement Performed for Wound #4 Left,Posterior Lower Leg Assessment: Performed By: Clinician Montey Hora, RN Debridement Type: Chemical/Enzymatic/Mechanical Agent Used: Santyl Severity of Tissue Pre Fat layer exposed Debridement: Level of Consciousness (Pre- Awake and Alert procedure): Pre-procedure Verification/Time Yes - 09:20 Out Taken: Start Time: 09:20 Pain Control: Lidocaine 4% Topical Solution Instrument: Other : tongue blade Bleeding: None End Time: 09:21 Procedural Pain: 0 Post Procedural Pain: 0 Response to Treatment: Procedure was tolerated well Level of Consciousness Awake and Alert (Post-procedure): Post Debridement Measurements of Total Wound Length: (cm) 4.4 Width: (cm) 5.5 Depth: (cm) 0.2 Volume: (cm) 3.801 Character of Wound/Ulcer Post Debridement: Improved Severity of Tissue Post Debridement: Fat layer exposed Post Procedure Diagnosis Same as Pre-procedure Electronic Signature(s) Signed: 08/14/2019 5:07:31 PM By: Montey Hora Signed: 08/14/2019 5:42:32 PM By: Linton Ham MD Entered By: Montey Hora on 08/14/2019 09:16:35 Christina Blackburn ([id number]) -------------------------------------------------------------------------------- HPI Details Patient Name: Christina Blackburn Date of Service: 08/14/2019 8:30 AM Medical Record Number: [id number] Patient Account Number: [id number] Date of Birth/Sex: October 31, 1945 (73 y.o. F) Treating RN: Montey Hora Primary Care Provider: [name]  Other Clinician: Referring Provider: [name] Treating Provider/Extender: Tito Dine in Treatment: 8 History of Present Illness HPI Description: 06/19/2019 on evaluation today patient presents today for an initial evaluation in our clinic regarding a problem that first began in June 2020. She states that she saw a red tender spot on her lower extremity. Subsequently she saw dermatology towards the beginning to middle of July. Following this time she actually ended up going to see her primary care provider on July 31 at which point she was given an antibiotic cream she states. On August 28 she actually ended up going to urgent care because this was getting worse and was placed on doxycycline at that time. This was the first round of doxycycline. On 06/17/2019 she was actually seen in the emergency department at [facility] where she was given IV vancomycin and recommended to continue with Augmentin as well as doxycycline on an outpatient basis following. She did have an x-ray performed at [facility] according to what she tells me today and this revealed that there was no signs of bone involvement with regard to infection. This obviously is excellent news. Right now she tells me she still having discomfort. Unfortunately she also has significant issues with peripheral vascular disease which is also somewhat unfortunate. Especially in light of the fact that the wound is obviously on her lower extremity. She actually has an appointment to see vascular at [facility] for second opinion as well with regard to this that is not till sometime around the 21st of this month September 2020. The patient does have a history of diabetes mellitus type 2 along with again peripheral vascular disease. Her most recent TBI's revealed that her ABIs were noncompressible bilaterally with a right TBI of 0.35 and a left TBI of 0.23 obviously she does have fairly significant peripheral vascular disease in my opinion. This is post  intervention including stent placement. 06/30/19 on evaluation today patient appears to be doing fairly well in regard to her wound on the left posterior lower extremity. She has been  tolerating the dressing changes without complication which is good news. Fortunately there's no signs of active infection. She actually does have an appointment with vascular surgery [facility] later this month. 07/14/2019 on evaluation today actually did see the patient back for reevaluation. Unfortunately the wound actually appears to be getting larger not better with everything that we have been trying up to this point. She did see vascular at [facility] last week. They unfortunately stated that they did not fill in their opinion that the patient would benefit from revascularization attempts as far as getting this wound to heal. It was noted in the note that if she is not healing appropriately and has any worsening of the wound that she may come back for revascularization at that point but they were not confident that that would make a difference in the wound healing. Nonetheless they did tell her that they felt like the wound would be able to heal but just would take a very long time. The patient obviously is somewhat frustrated at this point which I completely understand. The wound also is measuring larger today compared to last week. 07/21/2019 on evaluation today patient appears to be doing more poorly at this time with regard to her left lower extremity ulcer. She has been tolerating the dressing changes without complication. With that being said I really do not see any significant improvement at this time. There may also be some infection going on we did finally get the results of the culture back and it does show that she has 2 bacteria potentially causing issues coupled with the erythema and pain that she is experiencing I think that this needs to be addressed. As of right now all of the vascular specialist including the  second opinion that she had both agree that they feel she can heal with her current blood flow without any need for intervention. She was told that she was "at 50% flow reaching the wound". 07/28/2019 on evaluation today patient appears to be doing well with regard to her left leg ulcer as compared to last week. The measurements are slightly larger but at the same time she seems to be doing better with regard to the fact that she is having just a little itching much less pain much less redness and overall she is much happier with what she is seeing. No fevers, chills, nausea, vomiting, or diarrhea. 08/04/2019 on evaluation today patient appears to be doing well with regard to her wound on her leg at this time in regard to there being no signs of infection. With that being said she is having continued discomfort especially she tells me at nighttime. There does not appear to be any signs of active infection systemically either which is good news. There is necrotic tissue noted on the surface of the wound I am going to see if we can potentially debride some of this away today. 10/30; patient arrives back in clinic for a wound on her left posterior calf. She states that she is in extreme pain especially at NKENGE, MATTHIAS. ([id number]) night. Feels better when she is up walking but states she "cannot walk all night". She has had extensive arterial reviews including at [facility]. I saw their note briefly that says that she should have enough blood flow to heal these wounds I did not formally review her reports. She is using Santyl. She states that the debridement last time was excruciating. She once again has necrotic tissue on the wound surface Electronic Signature(s) Signed: 08/14/2019  5:42:32 PM By: Linton Ham MD Entered By: Linton Ham on 08/14/2019 09:41:03 Christina Blackburn ([id number]) -------------------------------------------------------------------------------- Physical Exam  Details Patient Name: Christina Blackburn Date of Service: 08/14/2019 8:30 AM Medical Record Number: [id number] Patient Account Number: [id number] Date of Birth/Sex: 05/23/1946 (73 y.o. F) Treating RN: Montey Hora Primary Care Provider: [name] Other Clinician: Referring Provider: [name] Treating Provider/Extender: Tito Dine in Treatment: 8 Constitutional Patient is hypertensive.. Pulse regular and within target range for patient.Marland Kitchen Respirations regular, non-labored and within target range.. Temperature is normal and within the target range for the patient.Marland Kitchen appears in no distress. Eyes Conjunctivae clear. No discharge. Respiratory Respiratory effort is easy and symmetric bilaterally. Rate is normal at rest and on room air.. Cardiovascular Popliteal pulse palpable on the left. Pedal pulses are nonpalpable. Lymphatic None palpable in the popliteal area. Integumentary (Hair, Skin) There is no erythema around the wound. Neurological She had some reduction to the microfilament on the plantar part of her left foot but overall the reduction was mild. Psychiatric No evidence of depression, anxiety, or agitation. Calm, cooperative, and communicative. Appropriate interactions and affect.. Notes Wound exam; I did not debride this although there is nonviable surface on top of this with fibrinous necrotic material. There is no erythema around the wound. Electronic Signature(s) Signed: 08/14/2019 5:42:32 PM By: Linton Ham MD Entered By: Linton Ham on 08/14/2019 09:43:50 Christina Blackburn ([id number]) -------------------------------------------------------------------------------- Physician Orders Details Patient Name: Christina Blackburn Date of Service: 08/14/2019 8:30 AM Medical Record Number: [id number] Patient Account Number: [id number] Date of Birth/Sex: May 02, 1946 (73 y.o. F) Treating RN: Montey Hora Primary Care Provider: [name]  Other Clinician: Referring Provider: [name] Treating Provider/Extender: Tito Dine in Treatment: 8 Verbal / Phone Orders: No Diagnosis Coding Wound Cleansing Wound #4 Left,Posterior Lower Leg o Clean wound with Normal Saline. o Dial antibacterial soap, wash wounds, rinse and pat dry prior to dressing wounds o May Shower, gently pat wound dry prior to applying new dressing. Primary Wound Dressing Wound #4 Left,Posterior Lower Leg o Santyl Ointment Secondary Dressing Wound #4 Left,Posterior Lower Leg o ABD pad - stretch netting to secure o Saline moistened gauze Dressing Change Frequency Wound #4 Left,Posterior Lower Leg o Change dressing every day. Follow-up Appointments Wound #4 Left,Posterior Lower Leg o Return Appointment in 2 weeks. - via telehealth Medications-please add to medication list. Wound #4 Left,Posterior Lower Leg o Santyl Enzymatic Ointment o Other: - Americaine 20% benzocaine spray Electronic Signature(s) Signed: 08/14/2019 5:07:31 PM By: Montey Hora Signed: 08/14/2019 5:42:32 PM By: Linton Ham MD Entered By: Montey Hora on 08/14/2019 09:15:09 Christina Blackburn ([id number]) -------------------------------------------------------------------------------- Problem List Details Patient Name: Christina Blackburn Date of Service: 08/14/2019 8:30 AM Medical Record Number: [id number] Patient Account Number: [id number] Date of Birth/Sex: Oct 14, 1946 (73 y.o. F) Treating RN: Montey Hora Primary Care Provider: [name] Other Clinician: Referring Provider: [name] Treating Provider/Extender: Tito Dine in Treatment: 8 Active Problems ICD-10 Evaluated Encounter Code Description Active Date Today Diagnosis E11.622 Type 2 diabetes mellitus with other skin ulcer 06/19/2019 No Yes L97.828 Non-pressure chronic ulcer of other part of left lower leg with 06/19/2019 No Yes other specified  severity I73.89 Other specified peripheral vascular diseases 06/19/2019 No Yes Inactive Problems Resolved Problems Electronic Signature(s) Signed: 08/14/2019 5:42:32 PM By: Linton Ham MD Entered By: Linton Ham on 08/14/2019 09:39:37 Christina Blackburn ([id number]) -------------------------------------------------------------------------------- Progress Note Details Patient Name: Christina Blackburn Date of Service: 08/14/2019 8:30 AM Medical Record Number:  [id number] Patient Account Number: [id number] Date of Birth/Sex: 1946/04/01 (73 y.o. F) Treating RN: Montey Hora Primary Care Provider: [name] Other Clinician: Referring Provider: [name] Treating Provider/Extender: Tito Dine in Treatment: 8 Subjective History of Present Illness (HPI) 06/19/2019 on evaluation today patient presents today for an initial evaluation in our clinic regarding a problem that first began in June 2020. She states that she saw a red tender spot on her lower extremity. Subsequently she saw dermatology towards the beginning to middle of July. Following this time she actually ended up going to see her primary care provider on July 31 at which point she was given an antibiotic cream she states. On August 28 she actually ended up going to urgent care because this was getting worse and was placed on doxycycline at that time. This was the first round of doxycycline. On 06/17/2019 she was actually seen in the emergency department at [facility] where she was given IV vancomycin and recommended to continue with Augmentin as well as doxycycline on an outpatient basis following. She did have an x-ray performed at [facility] according to what she tells me today and this revealed that there was no signs of bone involvement with regard to infection. This obviously is excellent news. Right now she tells me she still having discomfort. Unfortunately she also has significant issues with peripheral vascular  disease which is also somewhat unfortunate. Especially in light of the fact that the wound is obviously on her lower extremity. She actually has an appointment to see vascular at [facility] for second opinion as well with regard to this that is not till sometime around the 21st of this month September 2020. The patient does have a history of diabetes mellitus type 2 along with again peripheral vascular disease. Her most recent TBI's revealed that her ABIs were noncompressible bilaterally with a right TBI of 0.35 and a left TBI of 0.23 obviously she does have fairly significant peripheral vascular disease in my opinion. This is post intervention including stent placement. 06/30/19 on evaluation today patient appears to be doing fairly well in regard to her wound on the left posterior lower extremity. She has been tolerating the dressing changes without complication which is good news. Fortunately there's no signs of active infection. She actually does have an appointment with vascular surgery [facility] later this month. 07/14/2019 on evaluation today actually did see the patient back for reevaluation. Unfortunately the wound actually appears to be getting larger not better with everything that we have been trying up to this point. She did see vascular at [facility] last week. They unfortunately stated that they did not fill in their opinion that the patient would benefit from revascularization attempts as far as getting this wound to heal. It was noted in the note that if she is not healing appropriately and has any worsening of the wound that she may come back for revascularization at that point but they were not confident that that would make a difference in the wound healing. Nonetheless they did tell her that they felt like the wound would be able to heal but just would take a very long time. The patient obviously is somewhat frustrated at this point which I completely understand. The wound also is measuring larger  today compared to last week. 07/21/2019 on evaluation today patient appears to be doing more poorly at this time with regard to her left lower extremity ulcer. She has been tolerating the dressing changes without complication. With that being said  I really do not see any significant improvement at this time. There may also be some infection going on we did finally get the results of the culture back and it does show that she has 2 bacteria potentially causing issues coupled with the erythema and pain that she is experiencing I think that this needs to be addressed. As of right now all of the vascular specialist including the second opinion that she had both agree that they feel she can heal with her current blood flow without any need for intervention. She was told that she was "at 50% flow reaching the wound". 07/28/2019 on evaluation today patient appears to be doing well with regard to her left leg ulcer as compared to last week. The measurements are slightly larger but at the same time she seems to be doing better with regard to the fact that she is having just a little itching much less pain much less redness and overall she is much happier with what she is seeing. No fevers, chills, nausea, vomiting, or diarrhea. 08/04/2019 on evaluation today patient appears to be doing well with regard to her wound on her leg at this time in regard to there being no signs of infection. With that being said she is having continued discomfort especially she tells me at nighttime. There does not appear to be any signs of active infection systemically either which is good news. There is necrotic tissue noted on the surface of the wound I am going to see if we can potentially debride some of this away today. Christina Blackburn, Christina Blackburn ([id number]) 10/30; patient arrives back in clinic for a wound on her left posterior calf. She states that she is in extreme pain especially at night. Feels better when she is up walking  but states she "cannot walk all night". She has had extensive arterial reviews including at [facility]. I saw their note briefly that says that she should have enough blood flow to heal these wounds I did not formally review her reports. She is using Santyl. She states that the debridement last time was excruciating. She once again has necrotic tissue on the wound surface Objective Constitutional Patient is hypertensive.. Pulse regular and within target range for patient.Marland Kitchen Respirations regular, non-labored and within target range.. Temperature is normal and within the target range for the patient.Marland Kitchen appears in no distress. Vitals Time Taken: 8:32 AM, Height: 59 in, Weight: 170 lbs, BMI: 34.3, Temperature: 98.5 F, Pulse: 89 bpm, Respiratory Rate: 16 breaths/min, Blood Pressure: 170/72 mmHg. Eyes Conjunctivae clear. No discharge. Respiratory Respiratory effort is easy and symmetric bilaterally. Rate is normal at rest and on room air.. Cardiovascular Popliteal pulse palpable on the left. Pedal pulses are nonpalpable. Lymphatic None palpable in the popliteal area. Neurological She had some reduction to the microfilament on the plantar part of her left foot but overall the reduction was mild. Psychiatric No evidence of depression, anxiety, or agitation. Calm, cooperative, and communicative. Appropriate interactions and affect.. General Notes: Wound exam; I did not debride this although there is nonviable surface on top of this with fibrinous necrotic material. There is no erythema around the wound. Integumentary (Hair, Skin) There is no erythema around the wound. Wound #4 status is Open. Original cause of wound was Gradually Appeared. The wound is located on the Left,Posterior Lower Leg. The wound measures 4.4cm length x 5.5cm width x 0.2cm depth; 19.007cm^2 area and 3.801cm^3 volume. There is Fat Layer (Subcutaneous Tissue) Exposed exposed. There is no undermining noted. There is  a medium amount  of serous drainage noted. The wound margin is flat and intact. There is no granulation within the wound bed. There is a large (67-100%) amount of necrotic tissue within the wound bed including Eschar and Adherent Slough. Christina Blackburn ([id number]) Assessment Active Problems ICD-10 Type 2 diabetes mellitus with other skin ulcer Non-pressure chronic ulcer of other part of left lower leg with other specified severity Other specified peripheral vascular diseases Procedures Wound #4 Pre-procedure diagnosis of Wound #4 is a Diabetic Wound/Ulcer of the Lower Extremity located on the Left,Posterior Lower Leg .Severity of Tissue Pre Debridement is: Fat layer exposed. There was a Chemical/Enzymatic/Mechanical debridement performed by Montey Hora, RN. With the following instrument(s): tongue blade after achieving pain control using Lidocaine 4% Topical Solution. Agent used was [employer]. A time out was conducted at 09:20, prior to the start of the procedure. There was no bleeding. The procedure was tolerated well with a pain level of 0 throughout and a pain level of 0 following the procedure. Post Debridement Measurements: 4.4cm length x 5.5cm width x 0.2cm depth; 3.801cm^3 volume. Character of Wound/Ulcer Post Debridement is improved. Severity of Tissue Post Debridement is: Fat layer exposed. Post procedure Diagnosis Wound #4: Same as Pre-Procedure Plan Wound Cleansing: Wound #4 Left,Posterior Lower Leg: Clean wound with Normal Saline. Dial antibacterial soap, wash wounds, rinse and pat dry prior to dressing wounds May Shower, gently pat wound dry prior to applying new dressing. Primary Wound Dressing: Wound #4 Left,Posterior Lower Leg: Santyl Ointment Secondary Dressing: Wound #4 Left,Posterior Lower Leg: ABD pad - stretch netting to secure Saline moistened gauze Dressing Change Frequency: Wound #4 Left,Posterior Lower Leg: Change dressing every day. Follow-up Appointments: Wound #4  Left,Posterior Lower Leg: Return Appointment in 2 weeks. - via telehealth Medications-please add to medication list.: Wound #4 Left,Posterior Lower Leg: Santyl Enzymatic Ointment Other: - Americaine 20% benzocaine spray Christina Blackburn, Christina Blackburn. ([id number]) 1. I have continued the Santyl to the wound which certainly needs ongoing debridement. She is going to [facility] with her family. She knows not to get this in the water 2. Interestingly the vascular consult at [facility] seem to suggest she has adequate blood flow. Some of her pain description certainly sounds like claudication especially what she is describing at night but then again she says she can walk around [place] without stopping pushing a cart. I also wondered about neuropathic pain 3. I think it would be reasonable to go ahead and get her a pain clinic consult. Electronic Signature(s) Signed: 08/14/2019 5:42:32 PM By: Linton Ham MD Entered By: Linton Ham on 08/14/2019 09:45:15 Christina Blackburn ([id number]) -------------------------------------------------------------------------------- SuperBill Details Patient Name: Christina Blackburn Date of Service: 08/14/2019 Medical Record Number: [id number] Patient Account Number: [id number] Date of Birth/Sex: 10/02/1946 (73 y.o. F) Treating RN: Montey Hora Primary Care Provider: [name] Other Clinician: Referring Provider: [name] Treating Provider/Extender: Tito Dine in Treatment: 8 Diagnosis Coding ICD-10 Codes Code Description E11.622 Type 2 diabetes mellitus with other skin ulcer L97.828 Non-pressure chronic ulcer of other part of left lower leg with other specified severity I73.89 Other specified peripheral vascular diseases Facility Procedures CPT4 Code: [id number] Description: [phone] - DEBRIDE W/O ANES NON SELECT Modifier: Quantity: 1 Physician Procedures CPT4 Code Description: [id number] - WC PHYS LEVEL 3 - EST PT ICD-10 Diagnosis  Description L97.828 Non-pressure chronic ulcer of other part of left lower leg with E11.622 Type 2 diabetes mellitus with other skin ulcer I73.89 Other specified peripheral  vascular diseases Modifier:  other specified s Quantity: 1 Company secretary) Signed: 08/14/2019 5:42:32 PM By: Linton Ham MD Entered By: Linton Ham on 08/14/2019 09:46:35

## 2019-08-18 DIAGNOSIS — R2242 Localized swelling, mass and lump, left lower limb: Secondary | ICD-10-CM | POA: Diagnosis not present

## 2019-08-20 NOTE — Progress Notes (Signed)
Christina Blackburn Blackburn ([id number]) Visit Report for 08/14/2019 Arrival Information Details Patient Name: Christina Blackburn, Blackburn Date of Service: 08/14/2019 8:30 AM Medical Record Number: [id number] Patient Account Number: [id number] Date of Birth/Sex: January 09, 1946 (73 y.o. F) Treating RN: Christina Blackburn Blackburn Primary Care Christina Blackburn Blackburn: Christina Blackburn Blackburn Christina Blackburn Blackburn Christina Blackburn Blackburn: Christina Blackburn Blackburn Treating Christina Blackburn Blackburn/Extender: Christina Blackburn in Treatment: 8 Visit Information History Since Last Visit Added or deleted any medications: No Patient Arrived: Ambulatory Any new allergies or adverse reactions: No Arrival Time: 08:31 Had a fall or experienced change in No Accompanied By: self activities of daily living that may affect Transfer Assistance: None risk of falls: Patient Identification Verified: Yes Signs or symptoms of abuse/neglect since last visito No Secondary Verification Process Yes Hospitalized since last visit: No Completed: Implantable device outside of the clinic excluding No Patient Has Alerts: Yes cellular tissue based products placed in the center Patient Alerts: DMII since last visit: ABI [employer] BILATERAL Has Dressing in Place as Prescribed: Yes >220 Pain Present Now: Yes 03/23/19 TBI L .23 R .35 Electronic Signature(s) Signed: 08/20/2019 5:19:38 PM By: Christina Blackburn Blackburn, BSN, RN, CWS, Kim RN, BSN Entered By: Christina Blackburn Blackburn, BSN, RN, CWS, Christina Blackburn on 08/14/2019 08:31:45 Christina Blackburn Blackburn ([id number]) -------------------------------------------------------------------------------- Encounter Discharge Information Details Patient Name: Christina Blackburn Blackburn Date of Service: 08/14/2019 8:30 AM Medical Record Number: [id number] Patient Account Number: [id number] Date of Birth/Sex: March 26, 1946 (73 y.o. F) Treating RN: Christina Blackburn Blackburn Primary Care Christina Blackburn Blackburn: Christina Blackburn Blackburn Christina Blackburn Blackburn [name]: Christina Blackburn Blackburn Treating Juleen Sorrels/Extender: Christina Blackburn in Treatment: 8 Encounter  Discharge Information Items Post Procedure Vitals Discharge Condition: Stable Temperature (F): 98.5 Ambulatory Status: Ambulatory Pulse (bpm): 89 Discharge Destination: Home Respiratory Rate (breaths/min): 16 Transportation: Private Auto Blood Pressure (mmHg): 170/72 Accompanied By: self Schedule Follow-up Appointment: Yes Clinical Summary of Care: Electronic Signature(s) Signed: 08/14/2019 5:07:31 PM By: Christina Blackburn Blackburn Entered By: Christina Blackburn Blackburn on 08/14/2019 09:33:23 Christina Blackburn Blackburn ([id number]) -------------------------------------------------------------------------------- Lower Extremity Assessment Details Patient Name: Christina Blackburn Blackburn Date of Service: 08/14/2019 8:30 AM Medical Record Number: [id number] Patient Account Number: [id number] Date of Birth/Sex: June 10, 1946 (73 y.o. F) Treating RN: Christina Blackburn Blackburn Primary Care Christina Blackburn Blackburn: Christina Blackburn Blackburn Christina Blackburn Blackburn Christina Blackburn Blackburn: Christina Blackburn Blackburn Treating Christina Blackburn Blackburn/Extender: Christina Blackburn in Treatment: 8 Vascular Assessment Pulses: Dorsalis Pedis Palpable: [Left:Yes] Notes Leg red and warm to touch. Electronic Signature(s) Signed: 08/20/2019 5:19:38 PM By: Christina Blackburn Blackburn, BSN, RN, CWS, Kim RN, BSN Entered By: Christina Blackburn Blackburn, BSN, RN, CWS, Christina Blackburn on 08/14/2019 08:40:14 Christina Blackburn Blackburn ([id number]) -------------------------------------------------------------------------------- Multi Wound Chart Details Patient Name: Christina Blackburn Blackburn Date of Service: 08/14/2019 8:30 AM Medical Record Number: [id number] Patient Account Number: [id number] Date of Birth/Sex: 26-Mar-1946 (73 y.o. 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 N/A N/A Comorbid History: Chronic Obstructive N/A N/A Pulmonary Disease (COPD), Coronary Artery Disease, Hypertension, Type II Diabetes Date Acquired: 03/16/2019 N/A N/A Weeks of Treatment: 8 N/A N/A Wound Status: Open N/A N/A Measurements L x W x D 4.4x5.5x0.2 N/A N/A (cm) Area (cm) : 19.007 N/A N/A Volume (cm) : 3.801 N/A N/A % Reduction in Area: -848.90% N/A N/A % Reduction in  Volume: -1800.50% N/A N/A Classification: Grade 1 N/A N/A Exudate Amount: Medium N/A N/A Exudate Type: Serous N/A N/A Exudate Color: amber N/A N/A Wound Margin: Flat and Intact N/A N/A Granulation Amount: None Present (0%) N/A N/A Necrotic Amount: Large (67-100%) N/A N/A Necrotic Tissue: Eschar, Adherent Slough N/A N/A Exposed Structures: Fat Layer (Subcutaneous N/A N/A Tissue) Exposed: Yes Fascia: No Christina Blackburn, Blackburn ([id number]) Tendon: No Muscle: No Joint: No Bone: No Epithelialization: None N/A N/A Debridement: Chemical/Enzymatic/Mechanical N/A N/A Pre-procedure 09:20 N/A N/A Verification/Time Out Taken: Pain Control: Lidocaine 4% Topical Solution N/A N/A Instrument: Christina(tongue blade) N/A N/A Bleeding: None N/A N/A Procedural Pain: 0 N/A N/A Post Procedural Pain: 0 N/A N/A Debridement Treatment Procedure was tolerated well N/A N/A Response: Post Debridement 4.4x5.5x0.2 N/A N/A Measurements L x W x D (cm) Post Debridement Volume: 3.801 N/A N/A (cm) Procedures Performed: Debridement N/A N/A Treatment Notes Wound #4 (Left, Posterior Lower Leg) Notes santyl, moistened gauze, abd and netting Electronic Signature(s) Signed: 08/14/2019 5:42:32 PM By: Linton Ham MD Entered By: Linton Ham on 08/14/2019  09:39:46 Christina Blackburn Blackburn ([id number]) -------------------------------------------------------------------------------- [place] Plan Details Patient Name: Christina Blackburn Blackburn Date of Service: 08/14/2019 8:30 AM Medical Record Number: [id number] Patient Account Number: [id number] Date of Birth/Sex: 08/13/1946 (72 y.o. F) Treating RN: Christina Blackburn Blackburn Primary Care Arrionna Serena: Christina Blackburn Blackburn Christina Blackburn Blackburn Christasia Angeletti: Christina Blackburn Blackburn Treating Giselle Brutus/Extender: Christina Blackburn in Treatment: 8 Active Inactive Abuse / Safety / Falls / Self Care Management Nursing Diagnoses: Potential for falls Goals: Patient will remain injury free related to falls Date Initiated: 06/19/2019 Target Resolution Date: 09/19/2019 Goal Status: Active Interventions: Assess fall risk on admission and as needed Notes: Necrotic Tissue Nursing Diagnoses: Impaired tissue integrity related to necrotic/devitalized tissue Goals: Necrotic/devitalized tissue will be minimized in the wound bed Date Initiated: 06/19/2019 Target Resolution Date: 09/19/2019 Goal Status: Active Interventions: Provide education on necrotic tissue and debridement process Notes: Orientation to the Wound Care Program Nursing Diagnoses: Knowledge deficit related to the wound healing center program Goals: Patient/caregiver will verbalize understanding of the [facility] Program Date Initiated: 06/19/2019 Target Resolution Date: 09/19/2019 Goal Status: Active Interventions: Provide education on orientation to the wound center Christina Blackburn Blackburn ([id number]) Notes: Pain, Acute or Chronic Nursing Diagnoses: Pain, acute or chronic: actual or potential Goals: Patient/caregiver will verbalize comfort level met Date Initiated: 06/19/2019 Target Resolution Date: 09/19/2019 Goal Status: Active Interventions: Complete pain assessment as per visit requirements Notes: Wound/Skin Impairment Nursing  Diagnoses: Impaired tissue integrity Goals: Ulcer/skin breakdown will heal within 14 weeks Date Initiated: 06/19/2019 Target Resolution Date: 09/19/2019 Goal Status: Active Interventions: Assess patient/caregiver ability to obtain necessary supplies Assess patient/caregiver ability to perform ulcer/skin care regimen upon admission and as needed Assess ulceration(s) every visit Notes: Electronic Signature(s) Signed: 08/14/2019 5:07:31 PM By: Christina Blackburn Blackburn Entered By: Christina Blackburn Blackburn on 08/14/2019 09:11:36 Christina Blackburn Blackburn ([id number]) -------------------------------------------------------------------------------- Pain Assessment Details Patient Name: Christina Blackburn Blackburn Date of Service: 08/14/2019 8:30 AM Medical Record Number: [id number] Patient Account Number: [id number] Date of Birth/Sex: 12/18/1945 (73 y.o. F) Treating RN: Christina Blackburn Blackburn Primary Care Olen Eaves: Christina Blackburn Blackburn Christina Blackburn Blackburn Camara Renstrom: Christina Blackburn Blackburn Treating Shanelle Clontz/Extender: Christina Blackburn in Treatment: 8 Active Problems Location of Pain Severity and Description of Pain Patient Has Paino Yes Site Locations Rate the pain. Current Pain Level: 4 Character of Pain Describe the Pain: Shooting, Tender Pain Management and Medication Current Pain Management: Electronic Signature(s) Signed: 08/20/2019 5:19:38 PM By: Christina Blackburn Blackburn, BSN, RN, CWS, Kim RN, BSN Entered By:  Christina Blackburn Blackburn, BSN, RN, CWS, Christina Blackburn on 08/14/2019 08:32:03 Christina Blackburn Blackburn ([id number]) -------------------------------------------------------------------------------- Patient/Caregiver Education Details Patient Name: Christina Blackburn Blackburn Date of Service: 08/14/2019 8:30 AM Medical Record Number: [id number] Patient Account Number: [id number] Date of Birth/Gender: 05/09/1946 (73 y.o. F) Treating RN: Christina Blackburn Blackburn Primary Care Physician: Christina Blackburn Blackburn Christina Blackburn Blackburn Physician: Christina Blackburn Blackburn Treating Physician/Extender: Christina Blackburn in Treatment: 8 Education Assessment Education Provided To: Patient Education Topics Provided Wound/Skin Impairment: Handouts: Christina: wound care as ordered Methods: Explain/Verbal Responses: State content correctly Electronic Signature(s) Signed: 08/14/2019 5:07:31 PM By: Christina Blackburn Blackburn Entered By: Christina Blackburn Blackburn on 08/14/2019 09:16:57 Christina Blackburn Blackburn ([id number]) -------------------------------------------------------------------------------- Wound Assessment Details Patient Name: Christina Blackburn Blackburn Date of Service: 08/14/2019 8:30 AM Medical Record Number: [id number] Patient Account Number: [id number] Date of Birth/Sex: 16-May-1946 (73 y.o. F) Treating RN: Christina Blackburn Blackburn Primary Care Camellia Popescu: Christina Blackburn Blackburn Christina Blackburn Blackburn Trung Wenzl: Christina Blackburn Blackburn Treating Xan Ingraham/Extender: Christina Blackburn in Treatment: 8 Wound Status Wound Number: 4 Primary Diabetic Wound/Ulcer of the Lower Extremity Etiology: Wound Location: Left Lower Leg - Posterior Secondary Arterial Insufficiency Ulcer Wounding Event: Gradually Appeared Etiology: Date Acquired: 03/16/2019 Wound Open Weeks Of Treatment: 8 Status: Clustered Wound: No Comorbid Chronic Obstructive Pulmonary Disease History: (COPD), Coronary Artery Disease, Hypertension, Type II Diabetes Photos Wound Measurements Length: (cm) 4.4 % Reductio Width: (cm) 5.5 % Reductio Depth: (cm) 0.2 Epithelial Area: (cm) 19.007 Undermini Volume: (cm) 3.801 n in Area: -848.9% n in Volume: -1800.5% ization: None ng: No Wound Description Classification: Grade 1 Foul Odor Wound Margin: Flat and Intact Slough/Fib Exudate Amount: Medium Exudate Type: Serous Exudate Color: amber After Cleansing: No rino Yes Wound Bed Granulation Amount: None Present (0%) Exposed Structure Necrotic Amount: Large (67-100%) Fascia Exposed: No Necrotic Quality: Eschar, Adherent Slough Fat Layer (Subcutaneous Tissue) Exposed:  Yes Tendon Exposed: No Muscle Exposed: No Joint Exposed: No Bone Exposed: No Christina Blackburn, Blackburn ([id number]) Treatment Notes Wound #4 (Left, Posterior Lower Leg) Notes santyl, moistened gauze, abd and netting Electronic Signature(s) Signed: 08/20/2019 5:19:38 PM By: Christina Blackburn Blackburn, BSN, RN, CWS, Kim RN, BSN Entered By: Christina Blackburn Blackburn, BSN, RN, CWS, Christina Blackburn on 08/14/2019 08:38:20 Christina Blackburn Blackburn ([id number]) -------------------------------------------------------------------------------- Vitals Details Patient Name: Christina Blackburn Blackburn Date of Service: 08/14/2019 8:30 AM Medical Record Number: [id number] Patient Account Number: [id number] Date of Birth/Sex: Dec 20, 1945 (73 y.o. F) Treating RN: Christina Blackburn Blackburn Primary Care Jameila Keeny: Christina Blackburn Blackburn Christina Blackburn Blackburn Xzayvier Fagin: Christina Blackburn Blackburn Treating Avian Greenawalt/Extender: Christina Blackburn in Treatment: 8 Vital Signs Time Taken: 08:32 Temperature (F): 98.5 Height (in): 59 Pulse (bpm): 89 Weight (lbs): 170 Respiratory Rate (breaths/min): 16 Body Mass Index (BMI): 34.3 Blood Pressure (mmHg): 170/72 Reference Range: 80 - 120 mg / dl Electronic Signature(s) Signed: 08/20/2019 5:19:38 PM By: Christina Blackburn Blackburn, BSN, RN, CWS, Kim RN, BSN Entered By: Christina Blackburn Blackburn, BSN, RN, CWS, Christina Blackburn on 08/14/2019 08:32:40

## 2019-08-24 DIAGNOSIS — E11621 Type 2 diabetes mellitus with foot ulcer: Secondary | ICD-10-CM | POA: Diagnosis not present

## 2019-08-24 DIAGNOSIS — L97229 Non-pressure chronic ulcer of left calf with unspecified severity: Secondary | ICD-10-CM | POA: Diagnosis not present

## 2019-08-24 DIAGNOSIS — L03116 Cellulitis of left lower limb: Secondary | ICD-10-CM | POA: Diagnosis not present

## 2019-08-24 DIAGNOSIS — M7989 Other specified soft tissue disorders: Secondary | ICD-10-CM | POA: Diagnosis not present

## 2019-08-24 DIAGNOSIS — E11622 Type 2 diabetes mellitus with other skin ulcer: Secondary | ICD-10-CM | POA: Diagnosis not present

## 2019-08-24 DIAGNOSIS — L97529 Non-pressure chronic ulcer of other part of left foot with unspecified severity: Secondary | ICD-10-CM | POA: Diagnosis not present

## 2019-08-24 DIAGNOSIS — R600 Localized edema: Secondary | ICD-10-CM | POA: Diagnosis not present

## 2019-08-26 DIAGNOSIS — L97229 Non-pressure chronic ulcer of left calf with unspecified severity: Secondary | ICD-10-CM | POA: Diagnosis not present

## 2019-08-26 DIAGNOSIS — E1151 Type 2 diabetes mellitus with diabetic peripheral angiopathy without gangrene: Secondary | ICD-10-CM | POA: Diagnosis not present

## 2019-08-26 DIAGNOSIS — L97221 Non-pressure chronic ulcer of left calf limited to breakdown of skin: Secondary | ICD-10-CM | POA: Diagnosis not present

## 2019-08-26 DIAGNOSIS — I10 Essential (primary) hypertension: Secondary | ICD-10-CM | POA: Diagnosis not present

## 2019-08-26 DIAGNOSIS — I70242 Atherosclerosis of native arteries of left leg with ulceration of calf: Secondary | ICD-10-CM | POA: Diagnosis not present

## 2019-08-26 DIAGNOSIS — E11622 Type 2 diabetes mellitus with other skin ulcer: Secondary | ICD-10-CM | POA: Diagnosis not present

## 2019-08-26 DIAGNOSIS — Z87891 Personal history of nicotine dependence: Secondary | ICD-10-CM | POA: Diagnosis not present

## 2019-08-28 ENCOUNTER — Encounter: Payer: PPO | Attending: Physician Assistant | Admitting: Physician Assistant

## 2019-08-28 DIAGNOSIS — L539 Erythematous condition, unspecified: Secondary | ICD-10-CM | POA: Diagnosis not present

## 2019-08-28 DIAGNOSIS — L97829 Non-pressure chronic ulcer of other part of left lower leg with unspecified severity: Secondary | ICD-10-CM | POA: Diagnosis not present

## 2019-08-28 DIAGNOSIS — M79662 Pain in left lower leg: Secondary | ICD-10-CM | POA: Diagnosis not present

## 2019-08-28 NOTE — Progress Notes (Signed)
[name], [name] ([id number]) Visit Report for 08/28/2019 Allergy List Details Patient Name: Christina Blackburn, Christina Blackburn Date of Service: 08/28/2019 3:30 PM Medical Record Number: [id number] Patient Account Number: [id number] Date of Birth/Sex: 08/09/1946 (73 y.o. F) Treating RN: Christina Blackburn Primary Care Christina Blackburn: Christina Blackburn Other Clinician: Referring Christina Blackburn: Christina Blackburn Treating Christina Blackburn: Christina Blackburn, Christina Blackburn in Treatment: 10 Allergies Active Allergies hypo-allergenic tape Reaction: blisters Severity: Moderate Allergy Notes Electronic Signature(s) Signed: 08/28/2019 2:19:33 PM By: Worthy Keeler PA-C Entered By: Worthy Keeler on 08/28/2019 14:19:32

## 2019-08-31 NOTE — Progress Notes (Signed)
IMAN, HOLLAND ([id number]) Visit Report for 08/28/2019 Chief Complaint Document Details Patient Name: Christina Blackburn, Christina Blackburn Date of Service: 08/28/2019 3:30 PM Medical Record Number: [id number] Patient Account Number: [id number] Date of Birth/Sex: 13-Apr-1946 (73 y.o. F) Treating RN: Montey Hora Primary Care Provider: [name] Other Clinician: Referring Provider: [name] Treating Provider/Extender: Melburn Hake, HOYT Weeks in Treatment: 10 Information Obtained from: Patient Chief Complaint Left LE Ulcer Electronic Signature(s) Signed: 08/28/2019 2:20:39 PM By: Worthy Keeler PA-C Entered By: Worthy Keeler on 08/28/2019 14:20:39 Christina Blackburn ([id number]) -------------------------------------------------------------------------------- HPI Details Patient Name: Christina Blackburn Date of Service: 08/28/2019 3:30 PM Medical Record Number: [id number] Patient Account Number: [id number] Date of Birth/Sex: [birth year] (73 y.o. F) Treating RN: Montey Hora Primary Care Provider: [name] Other Clinician: Referring Provider: [name] Treating Provider/Extender: Melburn Hake, HOYT Weeks in Treatment: 10 History of Present Illness HPI Description: 06/19/2019 on evaluation today patient presents today for an initial evaluation in our clinic regarding a problem that first began in June 2020. She states that she saw a red tender spot on her lower extremity. Subsequently she saw dermatology towards the beginning to middle of July. Following this time she actually ended up going to see her primary care provider on July 31 at which point she was given an antibiotic cream she states. On August 28 she actually ended up going to urgent care because this was getting worse and was placed on doxycycline at that time. This was the first round of doxycycline. On 06/17/2019 she was actually seen in the emergency department at [facility] where she was given IV vancomycin and  recommended to continue with Augmentin as well as doxycycline on an outpatient basis following. She did have an x-ray performed at [facility] according to what she tells me today and this revealed that there was no signs of bone involvement with regard to infection. This obviously is excellent news. Right now she tells me she still having discomfort. Unfortunately she also has significant issues with peripheral vascular disease which is also somewhat unfortunate. Especially in light of the fact that the wound is obviously on her lower extremity. She actually has an appointment to see vascular at [facility] for second opinion as well with regard to this that is not till sometime around the 21st of this month September 2020. The patient does have a history of diabetes mellitus type 2 along with again peripheral vascular disease. Her most recent TBI's revealed that her ABIs were noncompressible bilaterally with a right TBI of 0.35 and a left TBI of 0.23 obviously she does have fairly significant peripheral vascular disease in my opinion. This is post intervention including stent placement. 06/30/19 on evaluation today patient appears to be doing fairly well in regard to her wound on the left posterior lower extremity. She has been tolerating the dressing changes without complication which is good news. Fortunately there's no signs of active infection. She actually does have an appointment with vascular surgery [facility] later this month. 07/14/2019 on evaluation today actually did see the patient back for reevaluation. Unfortunately the wound actually appears to be getting larger not better with everything that we have been trying up to this point. She did see vascular at [facility] last week. They unfortunately stated that they did not fill in their opinion that the patient would benefit from revascularization attempts as far as getting this wound to heal. It was noted in the note that if she is not healing appropriately and has  any worsening  of the wound that she may come back for revascularization at that point but they were not confident that that would make a difference in the wound healing. Nonetheless they did tell her that they felt like the wound would be able to heal but just would take a very long time. The patient obviously is somewhat frustrated at this point which I completely understand. The wound also is measuring larger today compared to last week. 07/21/2019 on evaluation today patient appears to be doing more poorly at this time with regard to her left lower extremity ulcer. She has been tolerating the dressing changes without complication. With that being said I really do not see any significant improvement at this time. There may also be some infection going on we did finally get the results of the culture back and it does show that she has 2 bacteria potentially causing issues coupled with the erythema and pain that she is experiencing I think that this needs to be addressed. As of right now all of the vascular specialist including the second opinion that she had both agree that they feel she can heal with her current blood flow without any need for intervention. She was told that she was "at 50% flow reaching the wound". 07/28/2019 on evaluation today patient appears to be doing well with regard to her left leg ulcer as compared to last week. The measurements are slightly larger but at the same time she seems to be doing better with regard to the fact that she is having just a little itching much less pain much less redness and overall she is much happier with what she is seeing. No fevers, chills, nausea, vomiting, or diarrhea. 08/04/2019 on evaluation today patient appears to be doing well with regard to her wound on her leg at this time in regard to there being no signs of infection. With that being said she is having continued discomfort especially she tells me at nighttime. There does not appear  to be any signs of active infection systemically either which is good news. There is necrotic tissue noted on the surface of the wound I am going to see if we can potentially debride some of this away today. 10/30; patient arrives back in clinic for a wound on her left posterior calf. She states that she is in extreme pain especially at FAWN, COMBES. ([id number]) night. Feels better when she is up walking but states she "cannot walk all night". She has had extensive arterial reviews including at [facility]. I saw their note briefly that says that she should have enough blood flow to heal these wounds I did not formally review her reports. She is using Santyl. She states that the debridement last time was excruciating. She once again has necrotic tissue on the wound surface 08/28/2019 on evaluation today patient actually is seen by way of a telehealth visit [place] the [place] We were going to do telehealth visits to keep her treatment going and ensure that everything was doing okay. With that being said she unfortunately had an issue where her wound became increasingly painful and subsequently she ended up going to the ER [place]. She tells me that they did place her on doxycycline and gave her a prescription for hydrocodone as well for the pain. Fortunately there is no signs of active infection at this time systemically although she  has not heard back from the wound culture as of yet. Electronic Signature(s) Signed: 08/28/2019 2:54:44 PM By: Worthy Keeler PA-C Entered By: Worthy Keeler on 08/28/2019 14:54:43 Christina Blackburn ([id number]) -------------------------------------------------------------------------------- Physical Exam Details Patient Name: Christina Blackburn Date of Service: 08/28/2019 3:30 PM Medical Record Number: [id number] Patient Account Number:  [id number] Date of Birth/Sex: 1945-10-30 (73 y.o. F) Treating RN: Montey Hora Primary Care Provider: [name] Other Clinician: Referring Provider: [name] Treating Provider/Extender: Melburn Hake, HOYT Weeks in Treatment: 27 Constitutional Well-nourished and well-hydrated in no acute distress. Respiratory normal breathing without difficulty. Cardiovascular 1+ pitting edema of the bilateral lower extremities. Psychiatric this patient is able to make decisions and demonstrates good insight into disease process. Alert and Oriented x 3. pleasant and cooperative. Notes Patient's wound bed currently unfortunately is showing signs of increased size as well as slough noted at this point. There was significant erythema noted through the video feed as well. Again we really cannot get an accurate measurement at this point. Nonetheless it does appear that the wound is larger than when I last evaluated her unfortunately. She is also been having increased pain although she states with the doxycycline this seems to be improving. Electronic Signature(s) Signed: 08/28/2019 2:55:52 PM By: Worthy Keeler PA-C Entered By: Worthy Keeler on 08/28/2019 14:55:51 Christina Blackburn ([id number]) -------------------------------------------------------------------------------- Physician Orders Details Patient Name: Christina Blackburn Date of Service: 08/28/2019 3:30 PM Medical Record Number: [id number] Patient Account Number: [id number] Date of Birth/Sex: 1946/01/07 (73 y.o. F) Treating RN: Montey Hora Primary Care Provider: [name] Other Clinician: Referring Provider: [name] Treating Provider/Extender: Melburn Hake, HOYT Weeks in Treatment: 10 Verbal / Phone Orders: No Diagnosis Coding ICD-10 Coding Code Description E11.622 Type 2 diabetes mellitus with other skin ulcer L97.828 Non-pressure chronic ulcer of other part of left lower leg with other specified  severity I73.89 Other specified peripheral vascular diseases Wound Cleansing Wound #4 Left,Posterior Lower Leg o Clean wound with Normal Saline. o Dial antibacterial soap, wash wounds, rinse and pat dry prior to dressing wounds o May Shower, gently pat wound dry prior to applying new dressing. Primary Wound Dressing Wound #4 Left,Posterior Lower Leg o Santyl Ointment o Silver Alginate - silvercel over top of Santyl Secondary Dressing Wound #4 Left,Posterior Lower Leg o ABD pad - stretch netting to secure Dressing Change Frequency Wound #4 Left,Posterior Lower Leg o Change dressing every day. Follow-up Appointments Wound #4 Left,Posterior Lower Leg o Return Appointment in 3 weeks. - Here in the clinic 1st week of December Medications-please add to medication list. Wound #4 Left,Posterior Lower Leg o P.O. Antibiotics - Doxycycline prescribed in the ER at the [facility]. patient was also given hydrocodone for the pain. o Santyl Enzymatic Ointment Electronic Signature(s) Signed: 08/28/2019 2:29:17 PM By: Worthy Keeler PA-C Entered By: Worthy Keeler on 08/28/2019 14:29:17 Christina Blackburn ([id number]) -------------------------------------------------------------------------------- Problem List Details Patient Name: Christina Blackburn Date of Service: 08/28/2019 3:30 PM Medical Record Number: [id number] Patient Account Number: [id number] Date of Birth/Sex: Jul 22, 1946 (73 y.o. F) Treating RN: Montey Hora Primary Care Provider: [name] Other Clinician: Referring Provider: [name] Treating Provider/Extender: Melburn Hake, HOYT Weeks in Treatment: 10 Active Problems ICD-10 Evaluated Encounter Code Description Active Date Today Diagnosis E11.622 Type 2 diabetes mellitus with other skin ulcer 06/19/2019 No Yes L97.828 Non-pressure chronic ulcer of other part of left lower leg with 06/19/2019 No Yes other specified severity I73.89 Other specified  peripheral vascular diseases 06/19/2019 No Yes  Inactive Problems Resolved Problems Electronic Signature(s) Signed: 08/28/2019 2:20:03 PM By: Worthy Keeler PA-C Entered By: Worthy Keeler on 08/28/2019 14:20:02 Christina Blackburn ([id number]) -------------------------------------------------------------------------------- Progress Note Details Patient Name: Christina Blackburn Date of Service: 08/28/2019 3:30 PM Medical Record Number: [id number] Patient Account Number: [id number] Date of Birth/Sex: 1946-05-25 (73 y.o. F) Treating RN: Montey Hora Primary Care Provider: [name] Other Clinician: Referring Provider: [name] Treating Provider/Extender: Melburn Hake, HOYT Weeks in Treatment: 10 Subjective Chief Complaint Information obtained from Patient Left LE Ulcer History of Present Illness (HPI) 06/19/2019 on evaluation today patient presents today for an initial evaluation in our clinic regarding a problem that first began in June 2020. She states that she saw a red tender spot on her lower extremity. Subsequently she saw dermatology towards the beginning to middle of July. Following this time she actually ended up going to see her primary care provider on July 31 at which point she was given an antibiotic cream she states. On August 28 she actually ended up going to urgent care because this was getting worse and was placed on doxycycline at that time. This was the first round of doxycycline. On 06/17/2019 she was actually seen in the emergency department at [facility] where she was given IV vancomycin and recommended to continue with Augmentin as well as doxycycline on an outpatient basis following. She did have an x-ray performed at [facility] according to what she tells me today and this revealed that there was no signs of bone involvement with regard to infection. This obviously is excellent news. Right now she tells me she still having discomfort. Unfortunately she also has  significant issues with peripheral vascular disease which is also somewhat unfortunate. Especially in light of the fact that the wound is obviously on her lower extremity. She actually has an appointment to see vascular at [facility] for second opinion as well with regard to this that is not till sometime around the 21st of this month September 2020. The patient does have a history of diabetes mellitus type 2 along with again peripheral vascular disease. Her most recent TBI's revealed that her ABIs were noncompressible bilaterally with a right TBI of 0.35 and a left TBI of 0.23 obviously she does have fairly significant peripheral vascular disease in my opinion. This is post intervention including stent placement. 06/30/19 on evaluation today patient appears to be doing fairly well in regard to her wound on the left posterior lower extremity. She has been tolerating the dressing changes without complication which is good news. Fortunately there's no signs of active infection. She actually does have an appointment with vascular surgery [facility] later this month. 07/14/2019 on evaluation today actually did see the patient back for reevaluation. Unfortunately the wound actually appears to be getting larger not better with everything that we have been trying up to this point. She did see vascular at [facility] last week. They unfortunately stated that they did not fill in their opinion that the patient would benefit from revascularization attempts as far as getting this wound to heal. It was noted in the note that if she is not healing appropriately and has any worsening of the wound that she may come back for revascularization at that point but they were not confident that that would make a difference in the wound healing. Nonetheless they did tell her that they felt like the wound would be able to heal but just would take a very long time. The patient obviously is somewhat frustrated  at this point which I completely  understand. The wound also is measuring larger today compared to last week. 07/21/2019 on evaluation today patient appears to be doing more poorly at this time with regard to her left lower extremity ulcer. She has been tolerating the dressing changes without complication. With that being said I really do not see any significant improvement at this time. There may also be some infection going on we did finally get the results of the culture back and it does show that she has 2 bacteria potentially causing issues coupled with the erythema and pain that she is experiencing I think that this needs to be addressed. As of right now all of the vascular specialist including the second opinion that she had both agree that they feel she can heal with her current blood flow without any need for intervention. She was told that she was "at 50% flow reaching the wound". 07/28/2019 on evaluation today patient appears to be doing well with regard to her left leg ulcer as compared to last week. The measurements are slightly larger but at the same time she seems to be doing better with regard to the fact that she is having just a little itching much less pain much less redness and overall she is much happier with what she is seeing. No fevers, chills, nausea, vomiting, or diarrhea. Christina Blackburn, Christina Blackburn ([id number]) 08/04/2019 on evaluation today patient appears to be doing well with regard to her wound on her leg at this time in regard to there being no signs of infection. With that being said she is having continued discomfort especially she tells me at nighttime. There does not appear to be any signs of active infection systemically either which is good news. There is necrotic tissue noted on the surface of the wound I am going to see if we can potentially debride some of this away today. 10/30; patient arrives back in clinic for a wound on her left posterior calf. She states that she is in extreme pain especially  at night. Feels better when she is up walking but states she "cannot walk all night". She has had extensive arterial reviews including at [facility]. I saw their note briefly that says that she should have enough blood flow to heal these wounds I did not formally review her reports. She is using Santyl. She states that the debridement last time was excruciating. She once again has necrotic tissue on the wound surface 08/28/2019 on evaluation today patient actually is seen by way of a telehealth visit secondary to the [place] We were going to do telehealth visits to keep her treatment going and ensure that everything was doing okay. With that being said she unfortunately had an issue where her wound became increasingly painful and subsequently she ended up going to the ER and [place]. She tells me that they did place her on doxycycline and gave her a prescription for hydrocodone as well for the pain. Fortunately there is no signs of active infection at this time systemically although she has not heard back from the wound culture as of yet. Patient History Information obtained from Patient. Allergies hypo-allergenic tape (Severity: Moderate, Reaction: blisters) Family History Cancer - Siblings,Mother, Hypertension - Father, Kidney Disease - Father, Lung Disease - Father, Stroke - Maternal Grandparents, No family history of Diabetes, Heart Disease, Hereditary Spherocytosis, Seizures, Thyroid Problems, Tuberculosis.  Social History Former smoker - ended on 10/15/2006, Marital Status - Married, Alcohol Use - Never, Drug Use - No History, Caffeine Use - Daily. Medical History Eyes Denies history of Cataracts, Glaucoma, Optic Neuritis Ear/Nose/Mouth/Throat Denies history of Chronic sinus problems/congestion, Middle ear problems Hematologic/Lymphatic Denies history of Anemia, Hemophilia, Human  Immunodeficiency Virus, Lymphedema, Sickle Cell Disease Respiratory Patient has history of Chronic Obstructive Pulmonary Disease (COPD) Denies history of Aspiration, Asthma, Pneumothorax, Sleep Apnea, Tuberculosis Cardiovascular Patient has history of Coronary Artery Disease - Stent placed 2009, Hypertension Denies history of Angina, Arrhythmia, Congestive Heart Failure, Deep Vein Thrombosis, Hypotension, Myocardial Infarction, Peripheral Arterial Disease, Peripheral Venous Disease, Phlebitis, Vasculitis Gastrointestinal Denies history of Cirrhosis , Colitis, Crohn s, Hepatitis A, Hepatitis B, Hepatitis C Endocrine Patient has history of Type II Diabetes - 2004 Denies history of Type I Diabetes Genitourinary Denies history of End Stage Renal Disease Immunological Denies history of Lupus Erythematosus, Raynaud s, Scleroderma Integumentary (Skin) Denies history of History of Burn, History of pressure wounds Christina Blackburn, Christina Blackburn ([id number]) Musculoskeletal Denies history of Gout, Rheumatoid Arthritis, Osteoarthritis, Osteomyelitis Neurologic Denies history of Dementia, Neuropathy, Quadriplegia, Paraplegia, Seizure Disorder Oncologic Denies history of Received Chemotherapy, Received Radiation Psychiatric Denies history of Anorexia/bulimia, Confinement Anxiety Hospitalization/Surgery History - Stent placed. - gall bladder. - hysterectomy. - c-section x2. - ARM 2 stents placed 10/2018. Review of Systems (ROS) Constitutional Symptoms (General Health) Denies complaints or symptoms of Fatigue, Fever, Chills, Marked Weight Change. Respiratory Denies complaints or symptoms of Chronic or frequent coughs, Shortness of Breath. Cardiovascular Denies complaints or symptoms of Chest pain, LE edema. Psychiatric Denies complaints or symptoms of Anxiety, Claustrophobia. Objective Constitutional Well-nourished and well-hydrated in no acute distress. Respiratory normal breathing without  difficulty. Cardiovascular 1+ pitting edema of the bilateral lower extremities. Psychiatric this patient is able to make decisions and demonstrates good insight into disease process. Alert and Oriented x 3. pleasant and cooperative. General Notes: Patient's wound bed currently unfortunately is showing signs of increased size as well as slough noted at this point. There was significant erythema noted through the video feed as well. Again we really cannot get an accurate measurement at this point. Nonetheless it does appear that the wound is larger than when I last evaluated her unfortunately. She is also been having increased pain although she states with the doxycycline this seems to be improving. Assessment Active Problems ICD-10 Christina Blackburn, Christina Blackburn ([id number]) Type 2 diabetes mellitus with other skin ulcer Non-pressure chronic ulcer of other part of left lower leg with other specified severity Other specified peripheral vascular diseases Plan Wound Cleansing: Wound #4 Left,Posterior Lower Leg: Clean wound with Normal Saline. Dial antibacterial soap, wash wounds, rinse and pat dry prior to dressing wounds May Shower, gently pat wound dry prior to applying new dressing. Primary Wound Dressing: Wound #4 Left,Posterior Lower Leg: Santyl Ointment Silver Alginate - silvercel over top of Santyl Secondary Dressing: Wound #4 Left,Posterior Lower Leg: ABD pad - stretch netting to secure Dressing Change Frequency: Wound #4 Left,Posterior Lower Leg: Change dressing every day. Follow-up Appointments: Wound #4 Left,Posterior Lower Leg: Return Appointment in 3 weeks. - Here in the clinic 1st week of December Medications-please add to medication list.: Wound #4 Left,Posterior Lower Leg: P.O. Antibiotics - Doxycycline prescribed in the ER at the [facility]. patient was also given hydrocodone for the pain. Santyl Enzymatic Ointment 1. My suggestion at this time was that the patient continue with  the doxycycline which was ordered by the ER at least until she hears back  from the culture. [place] so my suggestion is going to be that we have them see her for the next 2 weeks and then we will be seeing her back here in the clinic at that point. 2. They did add silver cell over top of the Santyl for the time being that honestly seems to have helped she tells me but again at this point I think that she definitely needs the Santyl to clean away some of the necrotic tissue ongoing at this time. 3. She is covering this with an ABD pad and then securing with netting. We will see patient back for reevaluation in 2 weeks here in the clinic. If anything worsens or changes patient will contact our office for additional recommendations. Electronic Signature(s) Signed: 08/28/2019 2:57:24 PM By: Worthy Keeler PA-C Entered By: Worthy Keeler on 08/28/2019 14:57:24 Christina Blackburn ([id number]) -------------------------------------------------------------------------------- ROS/PFSH Details Patient Name: Christina Blackburn Date of Service: 08/28/2019 3:30 PM Medical Record Number: [id number] Patient Account Number: [id number] Date of Birth/Sex: 1946-10-09 (73 y.o. F) Treating RN: Montey Hora Primary Care Provider: [name] Other Clinician: Referring Provider: [name] Treating Provider/Extender: Melburn Hake, HOYT Weeks in Treatment: 10 Information Obtained From Patient Constitutional Symptoms (General Health) Complaints and Symptoms: Negative for: Fatigue; Fever; Chills; Marked Weight Change Respiratory Complaints and Symptoms: Negative for: Chronic or frequent coughs; Shortness of Breath Medical History: Positive for: Chronic Obstructive Pulmonary Disease (COPD) Negative for: Aspiration; Asthma; Pneumothorax; Sleep Apnea; Tuberculosis Cardiovascular Complaints and Symptoms: Negative for: Chest  pain; LE edema Medical History: Positive for: Coronary Artery Disease - Stent placed 2009; Hypertension Negative for: Angina; Arrhythmia; Congestive Heart Failure; Deep Vein Thrombosis; Hypotension; Myocardial Infarction; Peripheral Arterial Disease; Peripheral Venous Disease; Phlebitis; Vasculitis Psychiatric Complaints and Symptoms: Negative for: Anxiety; Claustrophobia Medical History: Negative for: Anorexia/bulimia; Confinement Anxiety Eyes Medical History: Negative for: Cataracts; Glaucoma; Optic Neuritis Ear/Nose/Mouth/Throat Medical History: Negative for: Chronic sinus problems/congestion; Middle ear problems Hematologic/Lymphatic Medical History: Negative for: Anemia; Hemophilia; Human Immunodeficiency Virus; Lymphedema; Sickle Cell Disease Christina Blackburn, Christina Blackburn ([id number]) Gastrointestinal Medical History: Negative for: Cirrhosis ; Colitis; Crohnos; Hepatitis A; Hepatitis B; Hepatitis C Endocrine Medical History: Positive for: Type II Diabetes - 2004 Negative for: Type I Diabetes Time with diabetes: 15 years Treated with: Oral agents Blood sugar tested every day: Yes Tested : 2-3 times daily Blood sugar testing results: Breakfast: 84 Genitourinary Medical History: Negative for: End Stage Renal Disease Immunological Medical History: Negative for: Lupus Erythematosus; Raynaudos; Scleroderma Integumentary (Skin) Medical History: Negative for: History of Burn; History of pressure wounds Musculoskeletal Medical History: Negative for: Gout; Rheumatoid Arthritis; Osteoarthritis; Osteomyelitis Neurologic Medical History: Negative for: Dementia; Neuropathy; Quadriplegia; Paraplegia; Seizure Disorder Oncologic Medical History: Negative for: Received Chemotherapy; Received Radiation Immunizations Pneumococcal Vaccine: Received Pneumococcal Vaccination: Yes Tetanus Vaccine: Last tetanus shot: 06/19/2013 Implantable Devices None Hospitalization / Surgery  History Type of Hospitalization/Surgery Stent placed Christina Blackburn, Christina Blackburn ([id number]) gall bladder hysterectomy c-section x2 ARM 2 stents placed 10/2018 Family and Social History Cancer: Yes - Siblings,Mother; Diabetes: No; Heart Disease: No; Hereditary Spherocytosis: No; Hypertension: Yes - Father; Kidney Disease: Yes - Father; Lung Disease: Yes - Father; Seizures: No; Stroke: Yes - Maternal Grandparents; Thyroid Problems: No; Tuberculosis: No; Former smoker - ended on 10/15/2006; Marital Status - Married; Alcohol Use: Never; Drug Use: No History; Caffeine Use: Daily; Financial Concerns: No; Food, Clothing or Shelter Needs: No; Support System Lacking: No; Transportation Concerns: No  Physician Affirmation I have reviewed and agree with the above information. Electronic Signature(s) Signed: 08/28/2019 3:30:31 PM By: Worthy Keeler PA-C Signed: 08/31/2019 4:21:28 PM By: Montey Hora Entered By: Worthy Keeler on 08/28/2019 14:19:57 Christina Blackburn ([id number]) -------------------------------------------------------------------------------- SuperBill Details Patient Name: Christina Blackburn Date of Service: 08/28/2019 Medical Record Number: [id number] Patient Account Number: [id number] Date of Birth/Sex: 07-03-1946 (73 y.o. F) Treating RN: Montey Hora Primary Care Provider: [name] Other Clinician: Referring Provider: [name] Treating Provider/Extender: Melburn Hake, HOYT Weeks in Treatment: 10 Diagnosis Coding ICD-10 Codes Code Description E11.622 Type 2 diabetes mellitus with other skin ulcer L97.828 Non-pressure chronic ulcer of other part of left lower leg with other specified severity I73.89 Other specified peripheral vascular diseases Physician Procedures CPT4 Code Description: [id number] - WC PHYS LEVEL 3 - EST PT ICD-10 Diagnosis Description E11.622 Type 2 diabetes mellitus with other skin ulcer L97.828 Non-pressure chronic ulcer of other part of left  lower leg with I73.89 Other specified peripheral  vascular diseases Modifier: other specified s Quantity: 1 everity Electronic Signature(s) Signed: 08/28/2019 2:57:41 PM By: Worthy Keeler PA-C Entered By: Worthy Keeler on 08/28/2019 14:57:40

## 2019-09-02 DIAGNOSIS — I70242 Atherosclerosis of native arteries of left leg with ulceration of calf: Secondary | ICD-10-CM | POA: Diagnosis not present

## 2019-09-02 DIAGNOSIS — L97229 Non-pressure chronic ulcer of left calf with unspecified severity: Secondary | ICD-10-CM | POA: Diagnosis not present

## 2019-09-09 DIAGNOSIS — L97229 Non-pressure chronic ulcer of left calf with unspecified severity: Secondary | ICD-10-CM | POA: Diagnosis not present

## 2019-09-09 DIAGNOSIS — I70242 Atherosclerosis of native arteries of left leg with ulceration of calf: Secondary | ICD-10-CM | POA: Diagnosis not present

## 2019-09-15 ENCOUNTER — Other Ambulatory Visit: Payer: Self-pay

## 2019-09-15 ENCOUNTER — Encounter: Payer: PPO | Attending: Physician Assistant | Admitting: Physician Assistant

## 2019-09-15 DIAGNOSIS — Z87891 Personal history of nicotine dependence: Secondary | ICD-10-CM | POA: Diagnosis not present

## 2019-09-15 DIAGNOSIS — M79662 Pain in left lower leg: Secondary | ICD-10-CM | POA: Diagnosis not present

## 2019-09-15 DIAGNOSIS — I10 Essential (primary) hypertension: Secondary | ICD-10-CM | POA: Insufficient documentation

## 2019-09-15 DIAGNOSIS — I2510 Atherosclerotic heart disease of native coronary artery without angina pectoris: Secondary | ICD-10-CM | POA: Insufficient documentation

## 2019-09-15 DIAGNOSIS — I7389 Other specified peripheral vascular diseases: Secondary | ICD-10-CM | POA: Insufficient documentation

## 2019-09-15 DIAGNOSIS — Z7984 Long term (current) use of oral hypoglycemic drugs: Secondary | ICD-10-CM | POA: Insufficient documentation

## 2019-09-15 DIAGNOSIS — L97222 Non-pressure chronic ulcer of left calf with fat layer exposed: Secondary | ICD-10-CM | POA: Diagnosis not present

## 2019-09-15 DIAGNOSIS — E11622 Type 2 diabetes mellitus with other skin ulcer: Secondary | ICD-10-CM | POA: Insufficient documentation

## 2019-09-15 DIAGNOSIS — J449 Chronic obstructive pulmonary disease, unspecified: Secondary | ICD-10-CM | POA: Diagnosis not present

## 2019-09-16 NOTE — Progress Notes (Signed)
Christina Blackburn, Christina Blackburn ([id number]) Visit Report for 09/15/2019 Chief Complaint Document Details Patient Name: HAEDEN, MIYAGI Date of Service: 09/15/2019 9:15 AM Medical Record Number: [id number] Patient Account Number: [id number] Date of Birth/Sex: [birth year] (73 y.o. F) Treating RN: Montey Hora Primary Care Provider: [name] Other Clinician: Referring Provider: [name] Treating Provider/Extender: Melburn Hake, Dealie Koelzer Weeks in Treatment: 12 Information Obtained from: Patient Chief Complaint Left LE Ulcer Electronic Signature(s) Signed: 09/15/2019 5:47:36 PM By: Worthy Keeler PA-C Entered By: Worthy Keeler on 09/15/2019 09:36:10 Christina Blackburn ([id number]) -------------------------------------------------------------------------------- HPI Details Patient Name: Christina Blackburn Date of Service: 09/15/2019 9:15 AM Medical Record Number: [id number] Patient Account Number: [id number] Date of Birth/Sex: June 30, 1946 (73 y.o. F) Treating RN: Montey Hora Primary Care Provider: [name] Other Clinician: Referring Provider: [name] Treating Provider/Extender: Melburn Hake, Kira Hartl Weeks in Treatment: 12 History of Present Illness HPI Description: 06/19/2019 on evaluation today patient presents today for an initial evaluation in our clinic regarding a problem that first began in June 2020. She states that she saw a red tender spot on her lower extremity. Subsequently she saw dermatology towards the beginning to middle of July. Following this time she actually ended up going to see her primary care provider on July 31 at which point she was given an antibiotic cream she states. On August 28 she actually ended up going to urgent care because this was getting worse and was placed on doxycycline at that time. This was the first round of doxycycline. On 06/17/2019 she was actually seen in the emergency department at [facility] where she was given IV vancomycin and recommended  to continue with Augmentin as well as doxycycline on an outpatient basis following. She did have an x-ray performed at [facility] according to what she tells me today and this revealed that there was no signs of bone involvement with regard to infection. This obviously is excellent news. Right now she tells me she still having discomfort. Unfortunately she also has significant issues with peripheral vascular disease which is also somewhat unfortunate. Especially in light of the fact that the wound is obviously on her lower extremity. She actually has an appointment to see vascular at [facility] for second opinion as well with regard to this that is not till sometime around the 21st of this month September 2020. The patient does have a history of diabetes mellitus type 2 along with again peripheral vascular disease. Her most recent TBI's revealed that her ABIs were noncompressible bilaterally with a right TBI of 0.35 and a left TBI of 0.23 obviously she does have fairly significant peripheral vascular disease in my opinion. This is post intervention including stent placement. 06/30/19 on evaluation today patient appears to be doing fairly well in regard to her wound on the left posterior lower extremity. She has been tolerating the dressing changes without complication which is good news. Fortunately there's no signs of active infection. She actually does have an appointment with vascular surgery [facility] later this month. 07/14/2019 on evaluation today actually did see the patient back for reevaluation. Unfortunately the wound actually appears to be getting larger not better with everything that we have been trying up to this point. She did see vascular at [facility] last week. They unfortunately stated that they did not fill in their opinion that the patient would benefit from revascularization attempts as far as getting this wound to heal. It was noted in the note that if she is not healing appropriately and has any worsening  of the wound that she may come back for revascularization at that point but they were not confident that that would make a difference in the wound healing. Nonetheless they did tell her that they felt like the wound would be able to heal but just would take a very long time. The patient obviously is somewhat frustrated at this point which I completely understand. The wound also is measuring larger today compared to last week. 07/21/2019 on evaluation today patient appears to be doing more poorly at this time with regard to her left lower extremity ulcer. She has been tolerating the dressing changes without complication. With that being said I really do not see any significant improvement at this time. There may also be some infection going on we did finally get the results of the culture back and it does show that she has 2 bacteria potentially causing issues coupled with the erythema and pain that she is experiencing I think that this needs to be addressed. As of right now all of the vascular specialist including the second opinion that she had both agree that they feel she can heal with her current blood flow without any need for intervention. She was told that she was "at 50% flow reaching the wound". 07/28/2019 on evaluation today patient appears to be doing well with regard to her left leg ulcer as compared to last week. The measurements are slightly larger but at the same time she seems to be doing better with regard to the fact that she is having just a little itching much less pain much less redness and overall she is much happier with what she is seeing. No fevers, chills, nausea, vomiting, or diarrhea. 08/04/2019 on evaluation today patient appears to be doing well with regard to her wound on her leg at this time in regard to there being no signs of infection. With that being said she is having continued discomfort especially she tells me at nighttime. There does not appear to be any signs  of active infection systemically either which is good news. There is necrotic tissue noted on the surface of the wound I am going to see if we can potentially debride some of this away today. 10/30; patient arrives back in clinic for a wound on her left posterior calf. She states that she is in extreme pain especially at JU, AGREDA. ([id number]) night. Feels better when she is up walking but states she "cannot walk all night". She has had extensive arterial reviews including at [facility]. I saw their note briefly that says that she should have enough blood flow to heal these wounds I did not formally review her reports. She is using Santyl. She states that the debridement last time was excruciating. She once again has necrotic tissue on the wound surface 08/28/2019 on evaluation today patient actually is seen by way of a telehealth visit secondary [place] the [place] We were going to do telehealth visits to keep her treatment going and ensure that everything was doing okay. With that being said she unfortunately had an issue where her wound became increasingly painful and subsequently she ended up going to the ER and [place]. She tells me that they did place her on doxycycline and gave her a prescription for hydrocodone as well for the pain. Fortunately there is no signs of active infection at this time systemically although she  has not heard back from the wound culture as of yet. 09/15/2019 on evaluation today patient presents for reevaluation here in our clinic she [place] she was there and fortunately her wound got significantly worse. She did not being seen at the wound care center there which is at [facility] care. Subsequently she states that she ended up with the wound increasing in size and having quite a bit of discomfort unfortunately.  The patient tells me that at this point she has been very concerned with the fact that again she is having increased pain and continual pain she sleeps about 2 hours a night. She really does not have anything from the standpoint of pain management at this time to help her out unfortunately. She is concerned that again the doctor there at [place] care also was concerned about her vascular flow they felt like this was an ischemic ulcer. Again that was my assumption as well based on what I am seeing. I guess there is always a chance this could be something such as calciphylaxis but I am very concerned about the blood flow and is it sufficient to allow this area to heal and why is it getting worse that is larger despite the fact everything that has been done. They continue to use Iodoflex/Iodosorb for the patient in the interim. She is supposed to be having a follow-up appointment with her vascular doctor at [facility] on 10 December which is 9 days away. With that being said she does not know what to do as they have stated they felt like she had sufficient blood flow to heal the wounds and again from a wound care perspective were really seeing something that seems somewhat different. Either way the patient states that she is having significant issues with continued and ongoing discomfort as well as enlargement of the wound itself. Electronic Signature(s) Signed: 09/15/2019 5:47:36 PM By: Worthy Keeler PA-C Entered By: Worthy Keeler on 09/15/2019 10:15:30 Christina Blackburn ([id number]) -------------------------------------------------------------------------------- Physical Exam Details Patient Name: Christina Blackburn Date of Service: 09/15/2019 9:15 AM Medical Record Number: [id number] Patient Account Number: [id number] Date of Birth/Sex: 11/26/1945 (73 y.o. F) Treating RN: Montey Hora Primary Care Provider: [name] Other Clinician: Referring Provider: [name] Treating  Provider/Extender: Melburn Hake, Shaddai Shapley Weeks in Treatment: 36 Constitutional Well-nourished and well-hydrated in no acute distress. Respiratory normal breathing without difficulty. clear to auscultation bilaterally. Cardiovascular regular rate and rhythm with normal S1, S2. Psychiatric this patient is able to make decisions and demonstrates good insight into disease process. Alert and Oriented x 3. pleasant and cooperative. Notes Patient's wound bed unfortunately upon inspection today actually appears to be doing significantly worse. This is actually 2-3 times larger than the last time I saw her and unfortunately seems to be doing worse not better. I am still very concerned about issues with potential blood flow as this seems to be in my opinion arterial in nature. Especially in light of the severe pain. If it is not arterial than likely what were dealing with could be something such as calciphylaxis though I am still somewhat reluctant perform biopsies in a patient where I am concerned about arterial flow. The patient did see the physician again at the wound care center at [place] care [place] the beach and the physician there also was very concerned about the possibility of arterial insufficiency as the cause of the ulceration. Nonetheless they wanted  her to follow-up with vascular as soon as possible. Electronic Signature(s) Signed: 09/15/2019 5:47:36 PM By: Worthy Keeler PA-C Entered By: Worthy Keeler on 09/15/2019 10:20:32 Christina Blackburn ([id number]) -------------------------------------------------------------------------------- Physician Orders Details Patient Name: Christina Blackburn Date of Service: 09/15/2019 9:15 AM Medical Record Number: [id number] Patient Account Number: [id number] Date of Birth/Sex: [birth year] (73 y.o. F) Treating RN: Montey Hora Primary Care Provider: [name] Other Clinician: Referring Provider: [name] Treating  Provider/Extender: Melburn Hake, Sujata Maines Weeks in Treatment: 12 Verbal / Phone Orders: No Diagnosis Coding ICD-10 Coding Code Description E11.622 Type 2 diabetes mellitus with other skin ulcer L97.828 Non-pressure chronic ulcer of other part of left lower leg with other specified severity I73.89 Other specified peripheral vascular diseases Wound Cleansing Wound #4 Left,Posterior Lower Leg o Clean wound with Normal Saline. o Dial antibacterial soap, wash wounds, rinse and pat dry prior to dressing wounds o May Shower, gently pat wound dry prior to applying new dressing. Primary Wound Dressing Wound #4 Left,Posterior Lower Leg o Iodoflex Secondary Dressing Wound #4 Left,Posterior Lower Leg o ABD pad - stretch netting to secure o Saline moistened gauze Dressing Change Frequency Wound #4 Left,Posterior Lower Leg o Change dressing every day. Follow-up Appointments Wound #4 Left,Posterior Lower Leg o Return Appointment in 1 week. Medications-please add to medication list. Wound #4 Left,Posterior Lower Leg o Santyl Enzymatic Ointment o Other: - Americaine 20% benzocaine spray Consults o Vascular - [facility] o Pain Clinic RAYHANA, MARDEN ([id number]) Electronic Signature(s) Signed: 09/15/2019 4:29:10 PM By: Montey Hora Signed: 09/15/2019 5:47:36 PM By: Worthy Keeler PA-C Entered By: Montey Hora on 09/15/2019 09:57:05 Christina Blackburn ([id number]) -------------------------------------------------------------------------------- Problem List Details Patient Name: Christina Blackburn Date of Service: 09/15/2019 9:15 AM Medical Record Number: [id number] Patient Account Number: [id number] Date of Birth/Sex: 09-Jan-1946 (73 y.o. F) Treating RN: Montey Hora Primary Care Provider: [name] Other Clinician: Referring Provider: [name] Treating Provider/Extender: Melburn Hake, Akesha Uresti Weeks in Treatment: 12 Active  Problems ICD-10 Evaluated Encounter Code Description Active Date Today Diagnosis E11.622 Type 2 diabetes mellitus with other skin ulcer 06/19/2019 No Yes L97.828 Non-pressure chronic ulcer of other part of left lower leg with 06/19/2019 No Yes other specified severity I73.89 Other specified peripheral vascular diseases 06/19/2019 No Yes Inactive Problems Resolved Problems Electronic Signature(s) Signed: 09/15/2019 5:47:36 PM By: Worthy Keeler PA-C Entered By: Worthy Keeler on 09/15/2019 09:36:05 Christina Blackburn ([id number]) -------------------------------------------------------------------------------- Progress Note Details Patient Name: Christina Blackburn Date of Service: 09/15/2019 9:15 AM Medical Record Number: [id number] Patient Account Number: [id number] Date of Birth/Sex: 12-07-1945 (73 y.o. F) Treating RN: Montey Hora Primary Care Provider: [name] Other Clinician: Referring Provider: [name] Treating Provider/Extender: Melburn Hake, Denelle Capurro Weeks in Treatment: 12 Subjective Chief Complaint Information obtained from Patient Left LE Ulcer History of Present Illness (HPI) 06/19/2019 on evaluation today patient presents today for an initial evaluation in our clinic regarding a problem that first began in June 2020. She states that she saw a red tender spot on her lower extremity. Subsequently she saw dermatology towards the beginning to middle of July. Following this time she actually ended up going to see her primary care provider on July 31 at which point she was given an antibiotic cream she states. On August 28 she actually ended up going to urgent care because this was getting worse and was placed on doxycycline at that time. This was the first round of doxycycline. On 06/17/2019 she was actually seen in the  emergency department at [facility] where she was given IV vancomycin and recommended to continue with Augmentin as well as doxycycline on an outpatient basis  following. She did have an x-ray performed at [facility] according to what she tells me today and this revealed that there was no signs of bone involvement with regard to infection. This obviously is excellent news. Right now she tells me she still having discomfort. Unfortunately she also has significant issues with peripheral vascular disease which is also somewhat unfortunate. Especially in light of the fact that the wound is obviously on her lower extremity. She actually has an appointment to see vascular at [facility] for second opinion as well with regard to this that is not till sometime around the 21st of this month September 2020. The patient does have a history of diabetes mellitus type 2 along with again peripheral vascular disease. Her most recent TBI's revealed that her ABIs were noncompressible bilaterally with a right TBI of 0.35 and a left TBI of 0.23 obviously she does have fairly significant peripheral vascular disease in my opinion. This is post intervention including stent placement. 06/30/19 on evaluation today patient appears to be doing fairly well in regard to her wound on the left posterior lower extremity. She has been tolerating the dressing changes without complication which is good news. Fortunately there's no signs of active infection. She actually does have an appointment with vascular surgery [facility] later this month. 07/14/2019 on evaluation today actually did see the patient back for reevaluation. Unfortunately the wound actually appears to be getting larger not better with everything that we have been trying up to this point. She did see vascular at [facility] last week. They unfortunately stated that they did not fill in their opinion that the patient would benefit from revascularization attempts as far as getting this wound to heal. It was noted in the note that if she is not healing appropriately and has any worsening of the wound that she may come back for revascularization at that point  but they were not confident that that would make a difference in the wound healing. Nonetheless they did tell her that they felt like the wound would be able to heal but just would take a very long time. The patient obviously is somewhat frustrated at this point which I completely understand. The wound also is measuring larger today compared to last week. 07/21/2019 on evaluation today patient appears to be doing more poorly at this time with regard to her left lower extremity ulcer. She has been tolerating the dressing changes without complication. With that being said I really do not see any significant improvement at this time. There may also be some infection going on we did finally get the results of the culture back and it does show that she has 2 bacteria potentially causing issues coupled with the erythema and pain that she is experiencing I think that this needs to be addressed. As of right now all of the vascular specialist including the second opinion that she had both agree that they feel she can heal with her current blood flow without any need for intervention. She was told that she was "at 50% flow reaching the wound". 07/28/2019 on evaluation today patient appears to be doing well with regard to her left leg ulcer as compared to last week. The measurements are slightly larger but at the same time she seems to be doing better with regard to the fact that she is having just a little itching  much less pain much less redness and overall she is much happier with what she is seeing. No fevers, chills, nausea, vomiting, or diarrhea. DENISA, LEMAITRE ([id number]) 08/04/2019 on evaluation today patient appears to be doing well with regard to her wound on her leg at this time in regard to there being no signs of infection. With that being said she is having continued discomfort especially she tells me at nighttime. There does not appear to be any signs of active infection systemically either  which is good news. There is necrotic tissue noted on the surface of the wound I am going to see if we can potentially debride some of this away today. 10/30; patient arrives back in clinic for a wound on her left posterior calf. She states that she is in extreme pain especially at night. Feels better when she is up walking but states she "cannot walk all night". She has had extensive arterial reviews including at [facility]. I saw their note briefly that says that she should have enough blood flow to heal these wounds I did not formally review her reports. She is using Santyl. She states that the debridement last time was excruciating. She once again has necrotic tissue on the wound surface 08/28/2019 on evaluation today patient actually is seen by way of a telehealth visit secondary to the [place] We were going to do telehealth visits to keep her treatment going and ensure that everything was doing okay. With that being said she unfortunately had an issue where her wound became increasingly painful and subsequently she ended up going to the ER and [place] She tells me that they did place her on doxycycline and gave her a prescription for hydrocodone as well for the pain. Fortunately there is no signs of active infection at this time systemically although she has not heard back from the wound culture as of yet. 09/15/2019 on evaluation today patient presents for reevaluation here in our clinic she [place] she was there and fortunately her wound got significantly worse. She did not being seen at the wound care center there which is at [facility] care. Subsequently she states that she ended up with the wound increasing in size and having quite a bit of discomfort unfortunately. The patient tells me that at this point she has been very concerned with  the fact that again she is having increased pain and continual pain she sleeps about 2 hours a night. She really does not have anything from the standpoint of pain management at this time to help her out unfortunately. She is concerned that again the doctor there at [place] care also was concerned about her vascular flow they felt like this was an ischemic ulcer. Again that was my assumption as well based on what I am seeing. I guess there is always a chance this could be something such as calciphylaxis but I am very concerned about the blood flow and is it sufficient to allow this area to heal and why is it getting worse that is larger despite the fact everything that has been done. They continue to use Iodoflex/Iodosorb for the patient in the interim. She is supposed to be having a follow-up appointment with her vascular doctor at [facility] on 10 December which is 9 days away. With that being said she does  not know what to do as they have stated they felt like she had sufficient blood flow to heal the wounds and again from a wound care perspective were really seeing something that seems somewhat different. Either way the patient states that she is having significant issues with continued and ongoing discomfort as well as enlargement of the wound itself. Patient History Information obtained from Patient. Family History Cancer - Siblings,Mother, Hypertension - Father, Kidney Disease - Father, Lung Disease - Father, Stroke - Maternal Grandparents, No family history of Diabetes, Heart Disease, Hereditary Spherocytosis, Seizures, Thyroid Problems, Tuberculosis. Social History Former smoker - ended on 10/15/2006, Marital Status - Married, Alcohol Use - Never, Drug Use - No History, Caffeine Use - Daily. Medical History Eyes Denies history of Cataracts, Glaucoma, Optic Neuritis Ear/Nose/Mouth/Throat Denies history of Chronic sinus problems/congestion, Middle ear  problems Hematologic/Lymphatic Denies history of Anemia, Hemophilia, Human Immunodeficiency Virus, Lymphedema, Sickle Cell Disease Respiratory Patient has history of Chronic Obstructive Pulmonary Disease (COPD) Denies history of Aspiration, Asthma, Pneumothorax, Sleep Apnea, Tuberculosis Cardiovascular Patient has history of Coronary Artery Disease - Stent placed 2009, Hypertension Denies history of Angina, Arrhythmia, Congestive Heart Failure, Deep Vein Thrombosis, Hypotension, Myocardial Infarction, LODIE, ERBY ([id number]) Peripheral Arterial Disease, Peripheral Venous Disease, Phlebitis, Vasculitis Gastrointestinal Denies history of Cirrhosis , Colitis, Crohn s, Hepatitis A, Hepatitis B, Hepatitis C Endocrine Patient has history of Type II Diabetes - 2004 Denies history of Type I Diabetes Genitourinary Denies history of End Stage Renal Disease Immunological Denies history of Lupus Erythematosus, Raynaud s, Scleroderma Integumentary (Skin) Denies history of History of Burn, History of pressure wounds Musculoskeletal Denies history of Gout, Rheumatoid Arthritis, Osteoarthritis, Osteomyelitis Neurologic Denies history of Dementia, Neuropathy, Quadriplegia, Paraplegia, Seizure Disorder Oncologic Denies history of Received Chemotherapy, Received Radiation Psychiatric Denies history of Anorexia/bulimia, Confinement Anxiety Hospitalization/Surgery History - Stent placed. - gall bladder. - hysterectomy. - c-section x2. - ARM 2 stents placed 10/2018. Review of Systems (ROS) Constitutional Symptoms (General Health) Denies complaints or symptoms of Fatigue, Fever, Chills, Marked Weight Change. Respiratory Denies complaints or symptoms of Chronic or frequent coughs, Shortness of Breath. Cardiovascular Denies complaints or symptoms of Chest pain, LE edema. Psychiatric Denies complaints or symptoms of Anxiety, Claustrophobia. Objective Constitutional Well-nourished and  well-hydrated in no acute distress. Vitals Time Taken: 9:17 AM, Height: 59 in, Weight: 170 lbs, BMI: 34.3, Temperature: 98.5 F, Pulse: 81 bpm, Respiratory Rate: 18 breaths/min, Blood Pressure: 140/72 mmHg. Respiratory normal breathing without difficulty. clear to auscultation bilaterally. Cardiovascular regular rate and rhythm with normal S1, S2. Psychiatric this patient is able to make decisions and demonstrates good insight into disease process. Alert and Oriented x 3. pleasant and cooperative. SACHIKO, ALSTROM ([id number]) General Notes: Patient's wound bed unfortunately upon inspection today actually appears to be doing significantly worse. This is actually 2-3 times larger than the last time I saw her and unfortunately seems to be doing worse not better. I am still very concerned about issues with potential blood flow as this seems to be in my opinion arterial in nature. Especially in light of the severe pain. If it is not arterial than likely what were dealing with could be something such as calciphylaxis though I am still somewhat reluctant perform biopsies in a patient where I am concerned about arterial flow. The patient did see the physician again at the wound care center at [place] [facility] [place] and the physician there also was very concerned about the possibility of arterial insufficiency as  the cause of the ulceration. Nonetheless they wanted her to follow-up with vascular as soon as possible. Integumentary (Hair, Skin) Wound #4 status is Open. Original cause of wound was Gradually Appeared. The wound is located on the Left,Posterior Lower Leg. The wound measures 6cm length x 8.5cm width x 0.2cm depth; 40.055cm^2 area and 8.011cm^3 volume. There is Fat Layer (Subcutaneous Tissue) Exposed exposed. There is a medium amount of serous drainage noted. The wound margin is flat and intact. There is no granulation within the wound bed. There is a large  (67-100%) amount of necrotic tissue within the wound bed including Eschar and Adherent Slough. Assessment Active Problems ICD-10 Type 2 diabetes mellitus with other skin ulcer Non-pressure chronic ulcer of other part of left lower leg with other specified severity Other specified peripheral vascular diseases Plan Wound Cleansing: Wound #4 Left,Posterior Lower Leg: Clean wound with Normal Saline. Dial antibacterial soap, wash wounds, rinse and pat dry prior to dressing wounds May Shower, gently pat wound dry prior to applying new dressing. Primary Wound Dressing: Wound #4 Left,Posterior Lower Leg: Iodoflex Secondary Dressing: Wound #4 Left,Posterior Lower Leg: ABD pad - stretch netting to secure Saline moistened gauze Dressing Change Frequency: Wound #4 Left,Posterior Lower Leg: Change dressing every day. Follow-up Appointments: Wound #4 Left,Posterior Lower Leg: Return Appointment in 1 week. Medications-please add to medication list.: Wound #4 Left,Posterior Lower Leg: Santyl Enzymatic Ointment MARIALUIZA, WARLEY ([id number]) Other: - Americaine 20% benzocaine spray Consults ordered were: Vascular - [facility] 1. Upon inspection today patient's wound unfortunately appears to really be doing no better despite having not been present here in the clinic for the past month. She has been using Iodoflex but again the eschar is still significantly thick and attached at this point unfortunately. 2. With regard to her pain unfortunately this is quite severe begin to see about making a referral to a pain clinic in order to see if there is anything we can do to help her out in that regard. 3. With regard to the patient's vascular status I am still somewhat concerned about this. She really would like to have an opinion from [facility] in the wound care/vascular department. I am definitely in agreement with making that referral for her and we did go ahead  and place that today as well. 4. With regard to wrapping again were not going to wrap her leg due to concerns about arterial flow and I am also not going to perform any debridement today sharply as I am concerned about arterial flow. 5. If it turns out that they also agree that she has sufficient arterial flow then my opinion at that point would likely be that we probably need to perform a biopsy to evaluate for something such as calciphylaxis she is depend on how things go. Patient is in agreement with that plan. We will see patient back for reevaluation in 1 week here in the clinic. If anything worsens or changes patient will contact our office for additional recommendations. Electronic Signature(s) Signed: 09/15/2019 5:47:36 PM By: Worthy Keeler PA-C Entered By: Worthy Keeler on 09/15/2019 10:22:19 Christina Blackburn ([id number]) -------------------------------------------------------------------------------- ROS/PFSH Details Patient Name: Christina Blackburn Date of Service: 09/15/2019 9:15 AM Medical Record Number: [id number] Patient Account Number: [id number] Date of Birth/Sex: March 29, 1946 (73 y.o. F) Treating RN: Montey Hora Primary Care Provider: [name] Other Clinician: Referring Provider: [name] Treating Provider/Extender: Melburn Hake, Myrle Wanek Weeks in Treatment: 12 Information Obtained From Patient Constitutional Symptoms (General  Health) Complaints and Symptoms: Negative for: Fatigue; Fever; Chills; Marked Weight Change Respiratory Complaints and Symptoms: Negative for: Chronic or frequent coughs; Shortness of Breath Medical History: Positive for: Chronic Obstructive Pulmonary Disease (COPD) Negative for: Aspiration; Asthma; Pneumothorax; Sleep Apnea; Tuberculosis Cardiovascular Complaints and Symptoms: Negative for: Chest pain; LE edema Medical History: Positive for: Coronary Artery Disease - Stent placed 2009; Hypertension Negative for: Angina;  Arrhythmia; Congestive Heart Failure; Deep Vein Thrombosis; Hypotension; Myocardial Infarction; Peripheral Arterial Disease; Peripheral Venous Disease; Phlebitis; Vasculitis Psychiatric Complaints and Symptoms: Negative for: Anxiety; Claustrophobia Medical History: Negative for: Anorexia/bulimia; Confinement Anxiety Eyes Medical History: Negative for: Cataracts; Glaucoma; Optic Neuritis Ear/Nose/Mouth/Throat Medical History: Negative for: Chronic sinus problems/congestion; Middle ear problems Hematologic/Lymphatic Medical History: Negative for: Anemia; Hemophilia; Human Immunodeficiency Virus; Lymphedema; Sickle Cell Disease KILI, ESQUILIN ([id number]) Gastrointestinal Medical History: Negative for: Cirrhosis ; Colitis; Crohnos; Hepatitis A; Hepatitis B; Hepatitis C Endocrine Medical History: Positive for: Type II Diabetes - 2004 Negative for: Type I Diabetes Time with diabetes: 15 years Treated with: Oral agents Blood sugar tested every day: Yes Tested : 2-3 times daily Blood sugar testing results: Breakfast: 84 Genitourinary Medical History: Negative for: End Stage Renal Disease Immunological Medical History: Negative for: Lupus Erythematosus; Raynaudos; Scleroderma Integumentary (Skin) Medical History: Negative for: History of Burn; History of pressure wounds Musculoskeletal Medical History: Negative for: Gout; Rheumatoid Arthritis; Osteoarthritis; Osteomyelitis Neurologic Medical History: Negative for: Dementia; Neuropathy; Quadriplegia; Paraplegia; Seizure Disorder Oncologic Medical History: Negative for: Received Chemotherapy; Received Radiation Immunizations Pneumococcal Vaccine: Received Pneumococcal Vaccination: Yes Tetanus Vaccine: Last tetanus shot: 06/19/2013 Implantable Devices None Hospitalization / Surgery History Type of Hospitalization/Surgery Stent placed AINHARA, BURRI ([id number]) gall bladder hysterectomy c-section x2 ARM 2  stents placed 10/2018 Family and Social History Cancer: Yes - Siblings,Mother; Diabetes: No; Heart Disease: No; Hereditary Spherocytosis: No; Hypertension: Yes - Father; Kidney Disease: Yes - Father; Lung Disease: Yes - Father; Seizures: No; Stroke: Yes - Maternal Grandparents; Thyroid Problems: No; Tuberculosis: No; Former smoker - ended on 10/15/2006; Marital Status - Married; Alcohol Use: Never; Drug Use: No History; Caffeine Use: Daily; Financial Concerns: No; Food, Clothing or Shelter Needs: No; Support System Lacking: No; Transportation Concerns: No Physician Affirmation I have reviewed and agree with the above information. Electronic Signature(s) Signed: 09/15/2019 4:29:10 PM By: Montey Hora Signed: 09/15/2019 5:47:36 PM By: Worthy Keeler PA-C Entered By: Worthy Keeler on 09/15/2019 10:16:23 Christina Blackburn ([id number]) -------------------------------------------------------------------------------- SuperBill Details Patient Name: Christina Blackburn Date of Service: 09/15/2019 Medical Record Number: [id number] Patient Account Number: [id number] Date of Birth/Sex: 12/04/1945 (73 y.o. F) Treating RN: Montey Hora Primary Care Provider: [name] Other Clinician: Referring Provider: [name] Treating Provider/Extender: Melburn Hake, Rodina Pinales Weeks in Treatment: 12 Diagnosis Coding ICD-10 Codes Code Description E11.622 Type 2 diabetes mellitus with other skin ulcer L97.828 Non-pressure chronic ulcer of other part of left lower leg with other specified severity I73.89 Other specified peripheral vascular diseases Facility Procedures CPT4 Code: [id number] Description: [id number] - WOUND CARE VISIT-LEV 3 EST PT Modifier: Quantity: 1 Physician Procedures CPT4 Code Description: [id number] [place] - WC PHYS LEVEL 4 - EST PT ICD-10 Diagnosis Description E11.622 Type 2 diabetes mellitus with other skin ulcer L97.828 Non-pressure chronic ulcer of other part of left lower leg with I73.89  Other specified peripheral  vascular diseases Modifier: other specified s Quantity: 1 everity Electronic Signature(s) Signed: 09/15/2019 5:47:36 PM By: Worthy Keeler PA-C Entered By: Worthy Keeler on 09/15/2019 10:22:29

## 2019-09-16 NOTE — Progress Notes (Addendum)
LENOIR, LEGG ([id number]) Visit Report for 09/15/2019 Arrival Information Details Patient Name: Christina Blackburn, Christina Blackburn Date of Service: 09/15/2019 9:15 AM Medical Record Number: [id number] Patient Account Number: [id number] Date of Birth/Sex: 1946-08-29 (73 y.o. F) Treating RN: Harold Barban Primary Care Ayn Domangue: [name] Other Clinician: Referring Ezriel Boffa: [name] Treating Suzan Manon/Extender: Melburn Hake, HOYT Weeks in Treatment: 12 Visit Information History Since Last Visit Added or deleted any medications: No Patient Arrived: Ambulatory Any new allergies or adverse reactions: No Arrival Time: 09:16 Had a fall or experienced change in No Accompanied By: self activities of daily living that may affect Transfer Assistance: None risk of falls: Patient Identification Verified: Yes Signs or symptoms of abuse/neglect since last visito No Secondary Verification Process Yes Hospitalized since last visit: No Completed: Has Dressing in Place as Prescribed: Yes Patient Has Alerts: Yes Pain Present Now: No Patient Alerts: DMII ABI [place] BILATERAL >220 03/23/19 TBI L .23 R .35 Electronic Signature(s) Signed: 09/16/2019 4:10:47 PM By: Harold Barban Entered By: Harold Barban on 09/15/2019 09:16:56 Christina Blackburn ([id number]) -------------------------------------------------------------------------------- Clinic Level of Care Assessment Details Patient Name: Christina Blackburn Date of Service: 09/15/2019 9:15 AM Medical Record Number: [id number] Patient Account Number: [id number] Date of Birth/Sex: 1945-11-04 (73 y.o. F) Treating RN: Montey Hora Primary Care Federico Maiorino: [name] Other Clinician: Referring Sabella Traore: [name] Treating Jonesha Tsuchiya/Extender: Melburn Hake, HOYT Weeks in Treatment: 12 Clinic Level of Care Assessment Items TOOL 4 Quantity Score []  - Use when only an EandM is performed on FOLLOW-UP visit 0 ASSESSMENTS - Nursing Assessment /  Reassessment X - Reassessment of Co-morbidities (includes updates in patient status) 1 10 X- 1 5 Reassessment of Adherence to Treatment Plan ASSESSMENTS - Wound and Skin Assessment / Reassessment X - Simple Wound Assessment / Reassessment - one wound 1 5 []  - 0 Complex Wound Assessment / Reassessment - multiple wounds []  - 0 Dermatologic / Skin Assessment (not related to wound area) ASSESSMENTS - Focused Assessment []  - Circumferential Edema Measurements - multi extremities 0 []  - 0 Nutritional Assessment / Counseling / Intervention X- 1 5 Lower Extremity Assessment (monofilament, tuning fork, pulses) []  - 0 Peripheral Arterial Disease Assessment (using hand held doppler) ASSESSMENTS - Ostomy and/or Continence Assessment and Care []  - Incontinence Assessment and Management 0 []  - 0 Ostomy Care Assessment and Management (repouching, etc.) PROCESS - Coordination of Care X - Simple Patient / Family Education for ongoing care 1 15 []  - 0 Complex (extensive) Patient / Family Education for ongoing care X- 1 10 Staff obtains Programmer, systems, Records, Test Results / Process Orders []  - 0 Staff telephones HHA, Nursing Homes / Clarify orders / etc []  - 0 Routine Transfer to another Facility (non-emergent condition) []  - 0 Routine Hospital Admission (non-emergent condition) []  - 0 New Admissions / Biomedical engineer / Ordering NPWT, Apligraf, etc. []  - 0 Emergency Hospital Admission (emergent condition) X- 1 10 Simple Discharge Coordination Christina Blackburn, Christina Blackburn ([id number]) []  - 0 Complex (extensive) Discharge Coordination PROCESS - Special Needs []  - Pediatric / Minor Patient Management 0 []  - 0 Isolation Patient Management []  - 0 Hearing / Language / Visual special needs []  - 0 Assessment of Community assistance (transportation, D/C planning, etc.) []  - 0 Additional assistance / Altered mentation []  - 0 Support Surface(s) Assessment (bed, cushion, seat, etc.) INTERVENTIONS  - Wound Cleansing / Measurement X - Simple Wound Cleansing - one wound 1 5 []  - 0 Complex Wound Cleansing - multiple wounds X- 1 5 Wound Imaging (photographs -  any number of wounds) []  - 0 Wound Tracing (instead of photographs) X- 1 5 Simple Wound Measurement - one wound []  - 0 Complex Wound Measurement - multiple wounds INTERVENTIONS - Wound Dressings X - Small Wound Dressing one or multiple wounds 1 10 []  - 0 Medium Wound Dressing one or multiple wounds []  - 0 Large Wound Dressing one or multiple wounds []  - 0 Application of Medications - topical []  - 0 Application of Medications - injection INTERVENTIONS - Miscellaneous []  - External ear exam 0 []  - 0 Specimen Collection (cultures, biopsies, blood, body fluids, etc.) []  - 0 Specimen(s) / Culture(s) sent or taken to Lab for analysis []  - 0 Patient Transfer (multiple staff / Civil Service fast streamer / Similar devices) []  - 0 Simple Staple / Suture removal (25 or less) []  - 0 Complex Staple / Suture removal (26 or more) []  - 0 Hypo / Hyperglycemic Management (close monitor of Blood Glucose) []  - 0 Ankle / Brachial Index (ABI) - do not check if billed separately X- 1 5 Vital Signs Christina Blackburn, Christina Blackburn ([id number]) Has the patient been seen at the hospital within the last three years: Yes Total Score: 90 Level Of Care: New/Established - Level 3 Electronic Signature(s) Signed: 09/15/2019 4:29:10 PM By: Montey Hora Entered By: Montey Hora on 09/15/2019 09:55:57 Christina Blackburn ([id number]) -------------------------------------------------------------------------------- Encounter Discharge Information Details Patient Name: Christina Blackburn Date of Service: 09/15/2019 9:15 AM Medical Record Number: [id number] Patient Account Number: [id number] Date of Birth/Sex: [birth year] (73 y.o. F) Treating RN: Montey Hora Primary Care Karena Kinker: [name] Other Clinician: Referring Darrly Loberg: [name] Treating  Gonzalo Waymire/Extender: Melburn Hake, HOYT Weeks in Treatment: 12 Encounter Discharge Information Items Discharge Condition: Stable Ambulatory Status: Ambulatory Discharge Destination: Home Transportation: Private Auto Accompanied By: self Schedule Follow-up Appointment: Yes Clinical Summary of Care: Electronic Signature(s) Signed: 09/15/2019 4:29:10 PM By: Montey Hora Entered By: Montey Hora on 09/15/2019 09:56:50 Christina Blackburn ([id number]) -------------------------------------------------------------------------------- Lower Extremity Assessment Details Patient Name: Christina Blackburn Date of Service: 09/15/2019 9:15 AM Medical Record Number: [id number] Patient Account Number: [id number] Date of Birth/Sex: Jul 28, 1946 (73 y.o. F) Treating RN: Harold Barban Primary Care Jordell Outten: [name] Other Clinician: Referring Kino Dunsworth: [name] Treating Ajani Schnieders/Extender: Melburn Hake, HOYT Weeks in Treatment: 12 Vascular Assessment Pulses: Dorsalis Pedis Palpable: [Left:Yes] Posterior Tibial Palpable: [Left:Yes] Electronic Signature(s) Signed: 09/16/2019 4:10:47 PM By: Harold Barban Entered By: Harold Barban on 09/15/2019 09:28:12 Christina Blackburn ([id number]) -------------------------------------------------------------------------------- Multi Wound Chart Details Patient Name: Christina Blackburn Date of Service: 09/15/2019 9:15 AM Medical Record Number: [id number] Patient Account Number: [id number] Date of Birth/Sex: May 07, 1946 (73 y.o. F) Treating RN: Montey Hora Primary Care Mikki Ziff: [name] Other Clinician: Referring Rickita Forstner: [name] Treating Shaquavia Whisonant/Extender: Melburn Hake, HOYT Weeks in Treatment: 12 Vital Signs Height(in): 59 Pulse(bpm): 38 Weight(lbs): 170 Blood Pressure(mmHg): 140/72 Body Mass Index(BMI): 34 Temperature(F): 98.5 Respiratory Rate 18 (breaths/min): Photos: [N/A:N/A] Wound Location: Left Lower Leg - Posterior  N/A N/A Wounding Event: Gradually Appeared N/A N/A Primary Etiology: Diabetic Wound/Ulcer of the N/A N/A Lower Extremity Secondary Etiology: Arterial Insufficiency Ulcer N/A N/A Comorbid History: Chronic Obstructive N/A N/A Pulmonary Disease (COPD), Coronary Artery Disease, Hypertension, Type II Diabetes Date Acquired: 03/16/2019 N/A N/A Weeks of Treatment: 12 N/A N/A Wound Status: Open N/A N/A Measurements L x W x D 6x8.5x0.2 N/A N/A (cm) Area (cm) : 40.055 N/A N/A Volume (cm) : 8.011 N/A N/A % Reduction in Area: -1899.80% N/A N/A % Reduction in Volume: -3905.50% N/A N/A Classification: Grade 1  N/A N/A Exudate Amount: Medium N/A N/A Exudate Type: Serous N/A N/A Exudate Color: amber N/A N/A Wound Margin: Flat and Intact N/A N/A Granulation Amount: None Present (0%) N/A N/A Necrotic Amount: Large (67-100%) N/A N/A Necrotic Tissue: Eschar, Adherent Slough N/A N/A Exposed Structures: Fat Layer (Subcutaneous N/A N/A Tissue) Exposed: Yes Fascia: No Christina Blackburn, Christina Blackburn ([id number]) Tendon: No Muscle: No Joint: No Bone: No Epithelialization: None N/A N/A Treatment Notes Electronic Signature(s) Signed: 09/15/2019 4:29:10 PM By: Montey Hora Entered By: Montey Hora on 09/15/2019 09:44:53 Christina Blackburn ([id number]) -------------------------------------------------------------------------------- [place] Details Patient Name: Christina Blackburn Date of Service: 09/15/2019 9:15 AM Medical Record Number: [id number] Patient Account Number: [id number] Date of Birth/Sex: Jun 22, 1946 (73 y.o. F) Treating RN: Montey Hora Primary Care Genevieve Ritzel: [name] Other Clinician: Referring Marvens Hollars: [name] Treating Deltha Bernales/Extender: Melburn Hake, HOYT Weeks in Treatment: 12 Active Inactive Electronic Signature(s) Signed: 11/10/2019 6:54:12 PM By: Gretta Cool, BSN, RN, CWS, Kim RN, BSN Signed: 12/04/2019 12:02:54 PM By: Montey Hora Previous Signature:  10/27/2019 10:42:19 AM Version By: Montey Hora Previous Signature: 09/15/2019 4:29:10 PM Version By: Montey Hora Entered By: Gretta Cool BSN, RN, CWS, Kim on 11/10/2019 18:54:12 Christina Blackburn ([id number]) -------------------------------------------------------------------------------- Pain Assessment Details Patient Name: Christina Blackburn, Christina Blackburn Date of Service: 09/15/2019 9:15 AM Medical Record Number: [id number] Patient Account Number: [id number] Date of Birth/Sex: 1945/10/17 (73 y.o. F) Treating RN: Harold Barban Primary Care Miyah Hampshire: [name] Other Clinician: Referring Tobias Avitabile: [name] Treating Kathye Cipriani/Extender: Melburn Hake, HOYT Weeks in Treatment: 12 Active Problems Location of Pain Severity and Description of Pain Patient Has Paino No Site Locations Rate the pain. Current Pain Level: 0 Worst Pain Level: Insensate Pain Management and Medication Current Pain Management: Electronic Signature(s) Signed: 09/16/2019 4:10:47 PM By: Harold Barban Entered By: Harold Barban on 09/15/2019 09:17:19 Christina Blackburn ([id number]) -------------------------------------------------------------------------------- Patient/Caregiver Education Details Patient Name: Christina Blackburn Date of Service: 09/15/2019 9:15 AM Medical Record Number: [id number] Patient Account Number: [id number] Date of Birth/Gender: 01-30-1946 (73 y.o. F) Treating RN: Montey Hora Primary Care Physician: [name] Other Clinician: Referring Physician: [name] Treating Physician/Extender: Sharalyn Ink in Treatment: 12 Education Assessment Education Provided To: Patient Education Topics Provided Wound/Skin Impairment: Handouts: Other: wound care as ordered Methods: Demonstration, Explain/Verbal Responses: State content correctly Electronic Signature(s) Signed: 09/15/2019 4:29:10 PM By: Montey Hora Entered By: Montey Hora on 09/15/2019 09:56:15 Christina Blackburn ([id number]) -------------------------------------------------------------------------------- Wound Assessment Details Patient Name: Christina Blackburn Date of Service: 09/15/2019 9:15 AM Medical Record Number: [id number] Patient Account Number: [id number] Date of Birth/Sex: 1945/11/14 (73 y.o. F) Treating RN: Harold Barban Primary Care Lina Hitch: [name] Other Clinician: Referring Camber Ninh: [name] Treating Shenouda Genova/Extender: Melburn Hake, HOYT Weeks in Treatment: 12 Wound Status Wound Number: 4 Primary Diabetic Wound/Ulcer of the Lower Extremity Etiology: Wound Location: Left Lower Leg - Posterior Secondary Arterial Insufficiency Ulcer Wounding Event: Gradually Appeared Etiology: Date Acquired: 03/16/2019 Wound Open Weeks Of Treatment: 12 Status: Clustered Wound: No Comorbid Chronic Obstructive Pulmonary Disease History: (COPD), Coronary Artery Disease, Hypertension, Type II Diabetes Photos Wound Measurements Length: (cm) 6 % Reduction Width: (cm) 8.5 % Reduction Depth: (cm) 0.2 Epitheliali Area: (cm) 40.055 Volume: (cm) 8.011 in Area: -1899.8% in Volume: -3905.5% zation: None Wound Description Classification: Grade 1 Foul Odor A Wound Margin: Flat and Intact Slough/Fibr Exudate Amount: Medium Exudate Type: Serous Exudate Color: amber fter Cleansing: No ino Yes Wound Bed Granulation Amount: None Present (0%) Exposed Structure Necrotic Amount: Large (67-100%) Fascia Exposed: No Necrotic Quality: Eschar,  Adherent Slough Fat Layer (Subcutaneous Tissue) Exposed: Yes Tendon Exposed: No Muscle Exposed: No Joint Exposed: No Bone Exposed: No Christina Blackburn, Christina Blackburn ([id number]) Electronic Signature(s) Signed: 09/16/2019 4:10:47 PM By: Harold Barban Entered By: Harold Barban on 09/15/2019 09:27:49 Christina Blackburn ([id number]) -------------------------------------------------------------------------------- [place] Details Patient Name:  Christina Blackburn Date of Service: 09/15/2019 9:15 AM Medical Record Number: [id number] Patient Account Number: [id number] Date of Birth/Sex: [birth year] (73 y.o. F) Treating RN: Harold Barban Primary Care Tezra Mahr: [name] Other Clinician: Referring Miki Labuda: [name] Treating Adel Neyer/Extender: Melburn Hake, HOYT Weeks in Treatment: 12 Vital Signs Time Taken: 09:17 Temperature (F): 98.5 Height (in): 59 Pulse (bpm): 81 Weight (lbs): 170 Respiratory Rate (breaths/min): 18 Body Mass Index (BMI): 34.3 Blood Pressure (mmHg): 140/72 Reference Range: 80 - 120 mg / dl Electronic Signature(s) Signed: 09/16/2019 4:10:47 PM By: Harold Barban Entered By: Harold Barban on 09/15/2019 09:19:42

## 2019-09-18 ENCOUNTER — Other Ambulatory Visit: Payer: Self-pay

## 2019-09-18 ENCOUNTER — Encounter: Payer: Self-pay | Admitting: Emergency Medicine

## 2019-09-18 ENCOUNTER — Ambulatory Visit
Admission: EM | Admit: 2019-09-18 | Discharge: 2019-09-18 | Disposition: A | Discharge status: Home / Self Care | Payer: PPO | Attending: Family Medicine | Admitting: Family Medicine

## 2019-09-18 DIAGNOSIS — M79605 Pain in left leg: Secondary | ICD-10-CM

## 2019-09-18 DIAGNOSIS — L97909 Non-pressure chronic ulcer of unspecified part of unspecified lower leg with unspecified severity: Secondary | ICD-10-CM

## 2019-09-18 MED ORDER — HYDROCODONE-ACETAMINOPHEN 5-325 MG PO TABS: ORAL_TABLET | ORAL | 0 refills | Status: DC

## 2019-09-18 NOTE — ED Provider Notes (Signed)
[facility]    CSN: [id number] Arrival date & time: 09/18/19  1128      History   Chief Complaint Chief Complaint  Patient presents with   Leg Pain    left lower leg ulcer    HPI Christina Blackburn is a 73 y.o. female.   73 yo female with a diabetes, vascular disease and chronic left leg ulcer presents with a c/o chronic left leg pain. States she's been having chronic pain to her leg due to her ulcer and other conditions. States she's being seen weekly at [facility] wound clinic for her left leg ulcer and was referred by them to a pain specialist this recently. Patient states she's been in contact with the pain specialist's office and was told appointment wouldn't be for another month. Denies any injuries, fevers, chills.  Patient requesting something for pain.    Leg Pain   Past Medical History:  Diagnosis Date   Anemia    CHF (congestive heart failure) (HCC)    Chicken pox    Colon polyps    Complication of anesthesia    Coronary artery disease    Cystitis 1986   Diabetes mellitus without complication ([place]) 99991111   Diverticulosis    Heart disease 2008   Hyperlipidemia    Hypertension 2003   PAD (peripheral artery disease) (HCC)    PONV (postoperative nausea and vomiting)    Rotator cuff tendinitis    Squamous cell skin cancer 2016    Patient Active Problem List   Diagnosis Date Noted   Dermatitis 03/23/2019   Surgical site infection 11/19/2018   Atherosclerosis of native arteries of extremity with intermittent claudication ([place]) 11/12/2018   Atherosclerosis of native arteries of extremity with rest pain ([place]) 07/16/2018   Status post reverse total shoulder replacement, 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   PAD (peripheral artery disease) ([place]) 05/16/2017   Diabetes ([place]) 05/16/2017   Essential hypertension 05/16/2017   Hyperlipidemia 05/16/2017   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    Past Surgical History:  Procedure Laterality Date    ABDOMINAL HYSTERECTOMY  [id number]   APPLICATION OF WOUND VAC Bilateral 12/02/2018   Procedure: APPLICATION OF WOUND VAC;  Surgeon: Katha Cabal, MD;  Location: [facility] ORS;  Service: Vascular;  Laterality: Bilateral;   BREAST BIOPSY Left 2014   neg   CARDIAC CATHETERIZATION     cornary stent   [place]   COLONOSCOPY     COLONOSCOPY WITH PROPOFOL N/A 09/22/2018   Procedure: COLONOSCOPY WITH PROPOFOL;  Surgeon: Lollie Sails, MD;  Location: [facility] ENDOSCOPY;  Service: Endoscopy;  Laterality: N/A;   CORONARY ANGIOPLASTY  2008   stent x1   ENDARTERECTOMY FEMORAL Bilateral 11/12/2018   Procedure: ENDARTERECTOMY FEMORAL;  Surgeon: Katha Cabal, MD;  Location: [facility] ORS;  Service: Vascular;  Laterality: Bilateral;   INSERTION OF ILIAC STENT Bilateral 11/12/2018   Procedure: INSERTION OF ILIAC STENT;  Surgeon: Katha Cabal, MD;  Location: [facility] ORS;  Service: Vascular;  Laterality: Bilateral;   JOINT REPLACEMENT Right 04/12/2018   shoulder   LOWER EXTREMITY ANGIOGRAPHY Left 08/05/2018   Procedure: LOWER EXTREMITY ANGIOGRAPHY;  Surgeon: Katha Cabal, MD;  Location: [facility];  Service: Cardiovascular;  Laterality: Left;   REVERSE SHOULDER ARTHROPLASTY Right 04/08/2018   Procedure: REVERSE SHOULDER ARTHROPLASTY;  Surgeon: Corky Mull, MD;  Location: [facility] ORS;  Service: Orthopedics;  Laterality: Right;   TUBAL LIGATION     WOUND  DEBRIDEMENT Bilateral 12/02/2018   Procedure: DEBRIDEMENT WOUND;  Surgeon: Katha Cabal, MD;  Location: [facility] ORS;  Service: Vascular;  Laterality: Bilateral;    OB History    Gravida  2   Para  2   Term      Preterm      AB      Living  2     SAB      TAB      Ectopic      Multiple      Live Births           Obstetric Comments  Menstrual age: 81  Age 1st Pregnancy: 84         Home Medications    Prior to Admission medications   Medication Sig Start  Date End Date Taking? Authorizing Provider  amLODipine (NORVASC) 5 MG tablet Take 5 mg by mouth daily.   Yes [provider]  aspirin 81 MG tablet Take 81 mg by mouth daily.   Yes [provider]  atorvastatin (LIPITOR) 20 MG tablet Take 20 mg by mouth daily.  03/28/17  Yes [provider]  calcium carbonate (OS-CAL) 600 MG TABS tablet Take 600 mg by mouth 2 (two) times daily with a meal.   Yes [provider]  Cholecalciferol (VITAMIN D3 PO) Take 400 Units by mouth daily.    Yes [provider]  clopidogrel (PLAVIX) 75 MG tablet Take 75 mg by mouth daily.   Yes [provider]  cyclobenzaprine (FLEXERIL) 10 MG tablet Take 10 mg by mouth 3 (three) times daily as needed for muscle spasms.   Yes [provider]  doxycycline (VIBRAMYCIN) 100 MG capsule Take 1 capsule (100 mg total) by mouth 2 (two) times daily. 06/12/19  Yes Lorin Picket, PA-C  fenofibrate (TRICOR) 145 MG tablet Take 145 mg by mouth daily.   Yes [provider]  fish oil-omega-3 fatty acids 1000 MG capsule Take 1 g by mouth daily.   Yes [provider]  furosemide (LASIX) 20 MG tablet Take 20 mg by mouth as needed.   Yes [provider]  glimepiride (AMARYL) 4 MG tablet Take 4 mg by mouth daily with breakfast.    Yes [provider]  losartan-hydrochlorothiazide (HYZAAR) 100-25 MG tablet Take 1 tablet by mouth daily.  04/30/17  Yes [provider]  magnesium oxide (MAG-OX) 400 MG tablet Take 400 mg by mouth daily.   Yes [provider]  metFORMIN (GLUCOPHAGE) 1000 MG tablet Take 1,000 mg by mouth daily with breakfast.    Yes [provider]  metoprolol succinate (TOPROL-XL) 100 MG 24 hr tablet Take 100 mg by mouth daily. Take with or immediately following a meal.   Yes [provider]  OVER THE COUNTER MEDICATION Take 2 tablets by mouth at bedtime. Mag R&R Supplement   Yes [provider]    traZODone (DESYREL) 50 MG tablet Take 50 mg by mouth at bedtime.   Yes [provider]  TURMERIC PO Take by mouth daily.   Yes [provider]  vitamin B-12 (CYANOCOBALAMIN) 1000 MCG tablet Take 1,000 mcg by mouth daily.   Yes [provider]  vitamin E 400 UNIT capsule Take 400 Units by mouth daily.    Yes [provider]  HYDROcodone-acetaminophen (NORCO/VICODIN) 5-325 MG tablet 1 tab po bid prn 09/18/19   Norval Gable, MD  nystatin cream (MYCOSTATIN) Apply 1 application topically 2 (two) times daily. 02/19/19  Kris Hartmann, NP    Family History Family History  Problem Relation Age of Onset   Colon cancer Cousin 44   Lymphoma Brother 53   Lung cancer Mother    Hypertension Father    Kidney failure Father    Breast cancer Neg Hx     Social History Social History   Tobacco Use   Smoking status: Former Smoker    Years: 6.00    Types: Cigarettes    Quit date: 2008    Years since quitting: 12.9   Smokeless tobacco: Never Used  Substance Use Topics   Alcohol use: Yes    Comment: rare   Drug use: No     Allergies   Tape   Review of Systems Review of Systems   Physical Exam Triage Vital Signs ED Triage Vitals  Enc Vitals Group     BP 09/18/19 1142 (!) 185/77     Pulse Rate 09/18/19 1142 78     Resp 09/18/19 1142 14     Temp 09/18/19 1142 98.5 F (36.9 C)     Temp Source 09/18/19 1142 Oral     SpO2 09/18/19 1142 98 %     Weight 09/18/19 1138 170 lb (77.1 kg)     Height 09/18/19 1138 4\' 11"  (1.499 m)     Head Circumference --      Peak Flow --      Pain Score 09/18/19 1138 7     Pain Loc --      Pain Edu? --      Excl. in [place]? --    No data found.  Updated Vital Signs BP (!) 185/77 (BP Location: Right Arm)    Pulse 78    Temp 98.5 F (36.9 C) (Oral)    Resp 14    Ht 4\' 11"  (1.499 m)    Wt 77.1 kg    SpO2 98%    BMI 34.34 kg/m   Visual Acuity Right Eye Distance:   Left Eye Distance:   Bilateral  Distance:    Right Eye Near:   Left Eye Near:    Bilateral Near:     Physical Exam Vitals signs and nursing note reviewed.  Constitutional:      General: She is not in acute distress.    Appearance: She is not toxic-appearing or diaphoretic.  Skin:    Comments: Left lower extremity wound bandaged  Neurological:     Mental Status: She is alert.      UC Treatments / Results  Labs (all labs ordered are listed, but only abnormal results are displayed) Labs Reviewed - No data to display  EKG   Radiology No results found.  Procedures Procedures (including critical care time)  Medications Ordered in UC Medications - No data to display  Initial Impression / Assessment and Plan / UC Course  I have reviewed the triage vital signs and the nursing notes.  Pertinent labs & imaging results that were available during my care of the patient were reviewed by me and considered in my medical decision making (see chart for details).      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 Provider   HYDROcodone-acetaminophen (NORCO/VICODIN) 5-325 MG tablet 1 tab po bid prn 12 tablet Kassady Laboy, Linward Foster, MD      1. diagnosis reviewed with patient 2. rx as per orders above; reviewed possible side effects, interactions, risks and benefits  3. Recommend follow up with PCP, wound care and establish care with pain specialist 4. Follow-up prn  I have reviewed the PDMP during this encounter.   Norval Gable, MD 09/18/19 1321

## 2019-09-18 NOTE — ED Triage Notes (Addendum)
Patient states that she is being followed at the [facility] in [place].  Patient states she has a left lower leg ulcer since July.  Patient states that she has been referred to a pain clinic but is not able to get in until a month.  Patient is here today to see if she can get some pain medicine.

## 2019-09-18 NOTE — Discharge Instructions (Signed)
Follow up with [facility] wound clinic as scheduled, Primary Care provider and establish care with Pain specialist as planned

## 2019-09-22 ENCOUNTER — Encounter: Payer: PPO | Admitting: Physician Assistant

## 2019-09-22 ENCOUNTER — Other Ambulatory Visit: Payer: Self-pay

## 2019-09-22 ENCOUNTER — Ambulatory Visit: Payer: PPO

## 2019-09-22 ENCOUNTER — Other Ambulatory Visit (INDEPENDENT_AMBULATORY_CARE_PROVIDER_SITE_OTHER): Payer: Self-pay | Admitting: Vascular Surgery

## 2019-09-22 DIAGNOSIS — L97222 Non-pressure chronic ulcer of left calf with fat layer exposed: Secondary | ICD-10-CM | POA: Diagnosis not present

## 2019-09-22 DIAGNOSIS — E11621 Type 2 diabetes mellitus with foot ulcer: Secondary | ICD-10-CM | POA: Diagnosis not present

## 2019-09-22 DIAGNOSIS — E11622 Type 2 diabetes mellitus with other skin ulcer: Secondary | ICD-10-CM | POA: Diagnosis not present

## 2019-09-22 DIAGNOSIS — I70213 Atherosclerosis of native arteries of extremities with intermittent claudication, bilateral legs: Secondary | ICD-10-CM

## 2019-09-22 NOTE — Progress Notes (Addendum)
ALYSEA, CREASY ([id number]) Visit Report for 09/22/2019 Allergy List Details Patient Name: Christina Blackburn, Christina Blackburn Date of Service: 09/22/2019 9:15 AM Medical Record Number: [id number] Patient Account Number: [id number] Date of Birth/Sex: Jun 08, 1946 (74 y.o. F) Treating RN: Cornell Barman Primary Care Akyra Bouchie: [name] Other Clinician: Referring Henlee Donovan: [name] Treating Sala Tague/Extender: Melburn Hake, HOYT Weeks in Treatment: 13 Allergies Active Allergies hypo-allergenic tape Reaction: blisters Severity: Moderate Allergy Notes Electronic Signature(s) Signed: 09/22/2019 10:00:33 AM By: Gretta Cool, BSN, RN, CWS, Kim RN, BSN Entered By: Gretta Cool, BSN, RN, CWS, Kim on 09/22/2019 10:00:33 Christina Blackburn ([id number]) -------------------------------------------------------------------------------- Arrival Information Details Patient Name: Christina Blackburn Date of Service: 09/22/2019 9:15 AM Medical Record Number: [id number] Patient Account Number: [id number] Date of Birth/Sex: 03/23/1946 (73 y.o. F) Treating RN: Montey Hora Primary Care Faraaz Wolin: [name] Other Clinician: Referring Elleana Stillson: [name] Treating Kaydence Baba/Extender: Melburn Hake, HOYT Weeks in Treatment: 49 Visit Information Patient Arrived: Ambulatory Arrival Time: 09:19 Accompanied By: self Transfer Assistance: None Patient Identification Verified: Yes Secondary Verification Process Yes Completed: Patient Has Alerts: Yes Patient Alerts: DMII ABI [place] BILATERAL >220 03/23/19 TBI L .23 R .35 History Since Last Visit Added or deleted any medications: Yes Any new allergies or adverse reactions: No Had a fall or experienced change in activities of daily living that may affect risk of falls: No Signs or symptoms of abuse/neglect since last visito No Hospitalized since last visit: No Implantable device outside of the clinic excluding cellular tissue based products placed in the center since last visit:  No Has Dressing in Place as Prescribed: Yes Electronic Signature(s) Signed: 09/22/2019 11:59:11 AM By: Lorine Bears RCP, RRT, CHT Entered By: Lorine Bears on 09/22/2019 09:21:16 Christina Blackburn ([id number]) -------------------------------------------------------------------------------- Clinic Level of Care Assessment Details Patient Name: Christina Blackburn Date of Service: 09/22/2019 9:15 AM Medical Record Number: [id number] Patient Account Number: [id number] Date of Birth/Sex: 20-Oct-1945 (73 y.o. F) Treating RN: Montey Hora Primary Care Jaystin Mcgarvey: [name] Other Clinician: Referring Markeese Boyajian: [name] Treating Lashawnna Lambrecht/Extender: Melburn Hake, HOYT Weeks in Treatment: 13 Clinic Level of Care Assessment Items TOOL 4 Quantity Score []  - Use when only an EandM is performed on FOLLOW-UP visit 0 ASSESSMENTS - Nursing Assessment / Reassessment X - Reassessment of Co-morbidities (includes updates in patient status) 1 10 X- 1 5 Reassessment of Adherence to Treatment Plan ASSESSMENTS - Wound and Skin Assessment / Reassessment X - Simple Wound Assessment / Reassessment - one wound 1 5 []  - 0 Complex Wound Assessment / Reassessment - multiple wounds []  - 0 Dermatologic / Skin Assessment (not related to wound area) ASSESSMENTS - Focused Assessment []  - Circumferential Edema Measurements - multi extremities 0 []  - 0 Nutritional Assessment / Counseling / Intervention X- 1 5 Lower Extremity Assessment (monofilament, tuning fork, pulses) []  - 0 Peripheral Arterial Disease Assessment (using hand held doppler) ASSESSMENTS - Ostomy and/or Continence Assessment and Care []  - Incontinence Assessment and Management 0 []  - 0 Ostomy Care Assessment and Management (repouching, etc.) PROCESS - Coordination of Care X - Simple Patient / Family Education for ongoing care 1 15 []  - 0 Complex (extensive) Patient / Family Education for ongoing care X- 1  10 Staff obtains Programmer, systems, Records, Test Results / Process Orders []  - 0 Staff telephones HHA, Nursing Homes / Clarify orders / etc []  - 0 Routine Transfer to another Facility (non-emergent condition) []  - 0 Routine Hospital Admission (non-emergent condition) []  - 0 New Admissions / Biomedical engineer / Ordering NPWT, Apligraf, etc. []  -  [facility] Admission (emergent condition) X- 1 10 Simple Discharge Coordination Christina Blackburn, Christina Blackburn ([id number]) []  - 0 Complex (extensive) Discharge Coordination PROCESS - Special Needs []  - Pediatric / Minor Patient Management 0 []  - 0 Isolation Patient Management []  - 0 Hearing / Language / Visual special needs []  - 0 Assessment of Community assistance (transportation, D/C planning, etc.) []  - 0 Additional assistance / Altered mentation []  - 0 Support Surface(s) Assessment (bed, cushion, seat, etc.) INTERVENTIONS - Wound Cleansing / Measurement X - Simple Wound Cleansing - one wound 1 5 []  - 0 Complex Wound Cleansing - multiple wounds X- 1 5 Wound Imaging (photographs - any number of wounds) []  - 0 Wound Tracing (instead of photographs) X- 1 5 Simple Wound Measurement - one wound []  - 0 Complex Wound Measurement - multiple wounds INTERVENTIONS - Wound Dressings X - Small Wound Dressing one or multiple wounds 1 10 []  - 0 Medium Wound Dressing one or multiple wounds []  - 0 Large Wound Dressing one or multiple wounds []  - 0 Application of Medications - topical []  - 0 Application of Medications - injection INTERVENTIONS - Miscellaneous []  - External ear exam 0 []  - 0 Specimen Collection (cultures, biopsies, blood, body fluids, etc.) []  - 0 Specimen(s) / Culture(s) sent or taken to Lab for analysis []  - 0 Patient Transfer (multiple staff / Civil Service fast streamer / Similar devices) []  - 0 Simple Staple / Suture removal (25 or less) []  - 0 Complex Staple / Suture removal (26 or more) []  - 0 Hypo / Hyperglycemic  Management (close monitor of Blood Glucose) []  - 0 Ankle / Brachial Index (ABI) - do not check if billed separately X- 1 5 Vital Signs Christina Blackburn, Christina Blackburn ([id number]) Has the patient been seen at the hospital within the last three years: Yes Total Score: 90 Level Of Care: New/Established - Level 3 Electronic Signature(s) Signed: 09/22/2019 4:13:22 PM By: Montey Hora Entered By: Montey Hora on 09/22/2019 09:38:05 Christina Blackburn ([id number]) -------------------------------------------------------------------------------- Encounter Discharge Information Details Patient Name: Christina Blackburn Date of Service: 09/22/2019 9:15 AM Medical Record Number: [id number] Patient Account Number: [id number] Date of Birth/Sex: Jun 28, 1946 (73 y.o. F) Treating RN: Montey Hora Primary Care Cherokee Clowers: [name] Other Clinician: Referring Alaila Pillard: [name] Treating Yama Nielson/Extender: Melburn Hake, HOYT Weeks in Treatment: 13 Encounter Discharge Information Items Discharge Condition: Stable Ambulatory Status: Ambulatory Discharge Destination: Home Transportation: Private Auto Accompanied By: self Schedule Follow-up Appointment: Yes Clinical Summary of Care: Electronic Signature(s) Signed: 09/22/2019 4:13:22 PM By: Montey Hora Entered By: Montey Hora on 09/22/2019 09:38:59 Christina Blackburn ([id number]) -------------------------------------------------------------------------------- Lower Extremity Assessment Details Patient Name: Christina Blackburn Date of Service: 09/22/2019 9:15 AM Medical Record Number: [id number] Patient Account Number: [id number] Date of Birth/Sex: Mar 02, 1946 (73 y.o. F) Treating RN: Army Melia Primary Care Rashiya Lofland: [name] Other Clinician: Referring Othmar Ringer: [name] Treating Bleu Moisan/Extender: Melburn Hake, HOYT Weeks in Treatment: 13 Edema Assessment Assessed: [Left: No] [Right: No] Edema: [Left: N] [Right: o] Vascular  Assessment Pulses: Dorsalis Pedis Palpable: [Left:Yes] Electronic Signature(s) Signed: 09/22/2019 11:19:43 AM By: Army Melia Entered By: Army Melia on 09/22/2019 09:25:32 Christina Blackburn ([id number]) -------------------------------------------------------------------------------- Multi Wound Chart Details Patient Name: Christina Blackburn Date of Service: 09/22/2019 9:15 AM Medical Record Number: [id number] Patient Account Number: [id number] Date of Birth/Sex: Aug 21, 1946 (73 y.o. 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 N/A N/A Comorbid History: Chronic Obstructive N/A N/A Pulmonary Disease (COPD), Coronary Artery Disease, Hypertension, Type II Diabetes Date Acquired: 03/16/2019 N/A N/A Weeks of Treatment: 13 N/A N/A Wound Status: Open N/A N/A Measurements L x W x D 6.4x8.1x0.1 N/A N/A (cm) Area (cm) : 40.715 N/A N/A Volume (cm) : 4.072 N/A N/A % Reduction in Area: -1932.70% N/A N/A % Reduction in Volume: -1936.00% N/A N/A Classification: Grade 1 N/A N/A Exudate Amount: Medium N/A N/A Exudate Type: Serous N/A N/A Exudate Color: amber N/A N/A Wound Margin: Flat and Intact N/A N/A Granulation Amount: None Present (0%) N/A N/A Necrotic Amount: Large (67-100%) N/A N/A Necrotic Tissue: Eschar, Adherent Slough N/A N/A Exposed Structures: Fat Layer (Subcutaneous N/A N/A Tissue) Exposed: Yes Fascia: No Christina Blackburn, Christina Blackburn  ([id number]) Tendon: No Muscle: No Joint: No Bone: No Epithelialization: None N/A N/A Treatment Notes Electronic Signature(s) Signed: 09/22/2019 4:13:22 PM By: Montey Hora Entered By: Montey Hora on 09/22/2019 09:34:19 Christina Blackburn ([id number]) -------------------------------------------------------------------------------- [place] Details Patient Name: Christina Blackburn Date of Service: 09/22/2019 9:15 AM Medical Record Number: [id number] Patient Account Number: [id number] Date of Birth/Sex: 1946/09/14 (73 y.o. F) Treating RN: Montey Hora Primary Care Tarra Pence: [name] Other Clinician: Referring Alaila Pillard: [name] Treating Roxene Alviar/Extender: Melburn Hake, HOYT Weeks in Treatment: 13 Active Inactive Electronic Signature(s) Signed: 11/17/2019 10:12:49 AM By: Gretta Cool BSN, RN, CWS, Kim RN, BSN Signed: 12/04/2019 12:02:54 PM By: Montey Hora Previous Signature: 09/22/2019 4:13:22 PM Version By: Montey Hora Entered By: Gretta Cool BSN, RN, CWS, Kim on 11/17/2019 10:12:49 Christina Blackburn ([id number]) -------------------------------------------------------------------------------- Pain Assessment Details Patient Name: Christina Blackburn, Christina Blackburn Date of Service: 09/22/2019 9:15 AM Medical Record Number: [id number] Patient Account Number: [id number] Date of Birth/Sex: [birth year] (73 y.o. F) Treating RN: Montey Hora Primary Care Ibraheem Voris: [name] Other Clinician: Referring Gustavo Dispenza: [name] Treating Monteen Toops/Extender: Melburn Hake, HOYT Weeks in Treatment: 13 Active Problems Location of Pain Severity and Description of Pain Patient Has Paino Yes Site Locations Rate the pain. Current Pain Level: 4 Pain Management and Medication Current Pain Management: Electronic Signature(s) Signed: 09/22/2019 11:59:11 AM By: Lorine Bears RCP, RRT, CHT Signed: 09/22/2019 4:13:22 PM By: Montey Hora Entered By: Lorine Bears on 09/22/2019 09:21:31 Christina Blackburn ([id number]) -------------------------------------------------------------------------------- Patient/Caregiver Education Details Patient Name: Christina Blackburn Date of Service: 09/22/2019 9:15 AM Medical Record Number: [id number] Patient Account Number: [id number] Date of Birth/Gender: 1946-07-31 (73 y.o. F) Treating RN: Montey Hora Primary Care Physician: [name] Other Clinician: Referring Physician: [name] Treating Physician/Extender: Sharalyn Ink in Treatment: 13 Education Assessment Education Provided To: Patient Education Topics Provided Wound/Skin Impairment: Handouts: Other: wound care as ordered Methods: Demonstration, Explain/Verbal Responses: State content correctly Electronic Signature(s) Signed: 09/22/2019 4:13:22 PM By: Montey Hora Entered By: Montey Hora on 09/22/2019 09:38:32 Christina Blackburn ([id number]) -------------------------------------------------------------------------------- Wound Assessment Details Patient Name: Christina Blackburn Date of Service: 09/22/2019 9:15 AM Medical Record Number: [id number] Patient Account Number: [id number] Date of Birth/Sex: [birth year] (73 y.o. F) Treating RN: Army Melia Primary Care Timmy Bubeck: [name] Other Clinician: Referring Hunter Pinkard: [name] Treating Levonte Molina/Extender: Melburn Hake, HOYT Weeks in Treatment: 13 Wound Status Wound Number: 4 Primary Diabetic Wound/Ulcer of the Lower Extremity Etiology: Wound Location:  Left Lower Leg - Posterior Secondary Arterial Insufficiency Ulcer Wounding Event: Gradually Appeared Etiology: Date Acquired: 03/16/2019 Wound Open Weeks Of Treatment: 13 Status: Clustered Wound: No Comorbid Chronic Obstructive Pulmonary Disease History: (COPD), Coronary Artery Disease, Hypertension, Type II Diabetes Photos Wound Measurements Length: (cm) 6.4 % Reduction Width: (cm) 8.1  % Reduction Depth: (cm) 0.1 Epitheliali Area: (cm) 40.715 Tunneling: Volume: (cm) 4.072 Underminin in Area: -1932.7% in Volume: -1936% zation: None No g: No Wound Description Classification: Grade 1 Foul Odor A Wound Margin: Flat and Intact Slough/Fibr Exudate Amount: Medium Exudate Type: Serous Exudate Color: amber fter Cleansing: No ino Yes Wound Bed Granulation Amount: None Present (0%) Exposed Structure Necrotic Amount: Large (67-100%) Fascia Exposed: No Necrotic Quality: Eschar, Adherent Slough Fat Layer (Subcutaneous Tissue) Exposed: Yes Tendon Exposed: No Muscle Exposed: No Joint Exposed: No Bone Exposed: No Christina Blackburn, Christina Blackburn ([id number]) Electronic Signature(s) Signed: 09/22/2019 11:19:43 AM By: Army Melia Entered By: Army Melia on 09/22/2019 09:25:20 Christina Blackburn ([id number]) -------------------------------------------------------------------------------- Vitals Details Patient Name: Christina Blackburn Date of Service: 09/22/2019 9:15 AM Medical Record Number: [id number] Patient Account Number: [id number] Date of Birth/Sex: [birth year] (73 y.o. F) Treating RN: Montey Hora Primary Care Khamora Karan: [name] Other Clinician: Referring Harnoor Reta: [name] Treating Jahn Franchini/Extender: Melburn Hake, HOYT Weeks in Treatment: 13 Vital Signs Time Taken: 09:20 Temperature (F): 98.6 Height (in): 59 Pulse (bpm): 92 Weight (lbs): 170 Respiratory Rate (breaths/min): 18 Body Mass Index (BMI): 34.3 Blood Pressure (mmHg): 166/77 Reference Range: 80 - 120 mg / dl Electronic Signature(s) Signed: 09/22/2019 11:59:11 AM By: Lorine Bears RCP, RRT, CHT Entered By: Becky Sax, Amado Nash on 09/22/2019 09:22:45

## 2019-09-22 NOTE — Progress Notes (Signed)
NARYAH, PLATT ([id number]) Visit Report for 09/22/2019 Chief Complaint Document Details Patient Name: Christina Blackburn, Christina Blackburn Date of Service: 09/22/2019 9:15 AM Medical Record Number: [id number] Patient Account Number: [id number] Date of Birth/Sex: April 03, 1946 (73 y.o. F) Treating RN: Montey Hora Primary Care Provider: [name] Other Clinician: Referring Provider: [name] Treating Provider/Extender: Melburn Hake, Valory Wetherby Weeks in Treatment: 13 Information Obtained from: Patient Chief Complaint Left LE Ulcer Electronic Signature(s) Signed: 09/22/2019 9:52:07 AM By: Worthy Keeler PA-C Entered By: Worthy Keeler on 09/22/2019 09:31:03 Christina Blackburn ([id number]) -------------------------------------------------------------------------------- HPI Details Patient Name: Christina Blackburn Date of Service: 09/22/2019 9:15 AM Medical Record Number: [id number] Patient Account Number: [id number] Date of Birth/Sex: [birth year] (73 y.o. F) Treating RN: Montey Hora Primary Care Provider: [name] Other Clinician: Referring Provider: [name] Treating Provider/Extender: Melburn Hake, Vonnie Spagnolo Weeks in Treatment: 13 History of Present Illness HPI Description: 06/19/2019 on evaluation today patient presents today for an initial evaluation in our clinic regarding a problem that first began in June 2020. She states that she saw a red tender spot on her lower extremity. Subsequently she saw dermatology towards the beginning to middle of July. Following this time she actually ended up going to see her primary care provider on July 31 at which point she was given an antibiotic cream she states. On August 28 she actually ended up going to urgent care because this was getting worse and was placed on doxycycline at that time. This was the first round of doxycycline. On 06/17/2019 she was actually seen in the emergency department at [facility] where she was given IV vancomycin and recommended  to continue with Augmentin as well as doxycycline on an outpatient basis following. She did have an x-ray performed at [facility] according to what she tells me today and this revealed that there was no signs of bone involvement with regard to infection. This obviously is excellent news. Right now she tells me she still having discomfort. Unfortunately she also has significant issues with peripheral vascular disease which is also somewhat unfortunate. Especially in light of the fact that the wound is obviously on her lower extremity. She actually has an appointment to see vascular at [facility] for second opinion as well with regard to this that is not till sometime around the 21st of this month September 2020. The patient does have a history of diabetes mellitus type 2 along with again peripheral vascular disease. Her most recent TBI's revealed that her ABIs were noncompressible bilaterally with a right TBI of 0.35 and a left TBI of 0.23 obviously she does have fairly significant peripheral vascular disease in my opinion. This is post intervention including stent placement. 06/30/19 on evaluation today patient appears to be doing fairly well in regard to her wound on the left posterior lower extremity. She has been tolerating the dressing changes without complication which is good news. Fortunately there's no signs of active infection. She actually does have an appointment with vascular surgery [facility] later this month. 07/14/2019 on evaluation today actually did see the patient back for reevaluation. Unfortunately the wound actually appears to be getting larger not better with everything that we have been trying up to this point. She did see vascular at [facility] last week. They unfortunately stated that they did not fill in their opinion that the patient would benefit from revascularization attempts as far as getting this wound to heal. It was noted in the note that if she is not healing appropriately and has any worsening  of the wound that she may come back for revascularization at that point but they were not confident that that would make a difference in the wound healing. Nonetheless they did tell her that they felt like the wound would be able to heal but just would take a very long time. The patient obviously is somewhat frustrated at this point which I completely understand. The wound also is measuring larger today compared to last week. 07/21/2019 on evaluation today patient appears to be doing more poorly at this time with regard to her left lower extremity ulcer. She has been tolerating the dressing changes without complication. With that being said I really do not see any significant improvement at this time. There may also be some infection going on we did finally get the results of the culture back and it does show that she has 2 bacteria potentially causing issues coupled with the erythema and pain that she is experiencing I think that this needs to be addressed. As of right now all of the vascular specialist including the second opinion that she had both agree that they feel she can heal with her current blood flow without any need for intervention. She was told that she was "at 50% flow reaching the wound". 07/28/2019 on evaluation today patient appears to be doing well with regard to her left leg ulcer as compared to last week. The measurements are slightly larger but at the same time she seems to be doing better with regard to the fact that she is having just a little itching much less pain much less redness and overall she is much happier with what she is seeing. No fevers, chills, nausea, vomiting, or diarrhea. 08/04/2019 on evaluation today patient appears to be doing well with regard to her wound on her leg at this time in regard to there being no signs of infection. With that being said she is having continued discomfort especially she tells me at nighttime. There does not appear to be any signs  of active infection systemically either which is good news. There is necrotic tissue noted on the surface of the wound I am going to see if we can potentially debride some of this away today. 10/30; patient arrives back in clinic for a wound on her left posterior calf. She states that she is in extreme pain especially at LAVERA, HUBACH. ([id number]) night. Feels better when she is up walking but states she "cannot walk all night". She has had extensive arterial reviews including at [facility]. I saw their note briefly that says that she should have enough blood flow to heal these wounds I did not formally review her reports. She is using Santyl. She states that the debridement last time was excruciating. She once again has necrotic tissue on the wound surface 08/28/2019 on evaluation today patient actually is seen by way of a telehealth visit secondary [place] the [place] We were going to do telehealth visits to keep her treatment going and ensure that everything was doing okay. With that being said she unfortunately had an issue where her wound became increasingly painful and subsequently she ended up going to the ER and [place]. She tells me that they did place her on doxycycline and gave her a prescription for hydrocodone as well for the pain. Fortunately there is no signs of active infection at this time systemically although she  has not heard back from the wound culture as of yet. 09/15/2019 on evaluation today patient presents for reevaluation here in our clinic [place] she was there and fortunately her wound got significantly worse. She did not being seen at the wound care center there which is at [facility] care. Subsequently she states that she ended up with the wound increasing in size and having quite a bit of discomfort unfortunately.  The patient tells me that at this point she has been very concerned with the fact that again she is having increased pain and continual pain she sleeps about 2 hours a night. She really does not have anything from the standpoint of pain management at this time to help her out unfortunately. She is concerned that again the doctor there at [place] care also was concerned about her vascular flow they felt like this was an ischemic ulcer. Again that was my assumption as well based on what I am seeing. I guess there is always a chance this could be something such as calciphylaxis but I am very concerned about the blood flow and is it sufficient to allow this area to heal and why is it getting worse that is larger despite the fact everything that has been done. They continue to use Iodoflex/Iodosorb for the patient in the interim. She is supposed to be having a follow-up appointment with her vascular doctor at [facility] on 10 December which is 9 days away. With that being said she does not know what to do as they have stated they felt like she had sufficient blood flow to heal the wounds and again from a wound care perspective were really seeing something that seems somewhat different. Either way the patient states that she is having significant issues with continued and ongoing discomfort as well as enlargement of the wound itself. 09/22/19 on evaluation today patient appears to be doing well with regard to her wound all things considered. She's been having a lot of issues with pain and eschar. With that being said there's no signs of active infection at this time which is good news. No fevers, chills, nausea, or vomiting noted at this time. Electronic Signature(s) Signed: 09/22/2019 9:52:07 AM By: Worthy Keeler PA-C Entered By: Worthy Keeler on 09/22/2019 09:38:12 Christina Blackburn ([id number]) -------------------------------------------------------------------------------- Physical Exam  Details Patient Name: Christina Blackburn Date of Service: 09/22/2019 9:15 AM Medical Record Number: [id number] Patient Account Number: [id number] Date of Birth/Sex: Mar 06, 1946 (73 y.o. F) Treating RN: Montey Hora Primary Care Provider: [name] Other Clinician: Referring Provider: [name] Treating Provider/Extender: Melburn Hake, Norva Bowe Weeks in Treatment: 25 Constitutional Well-nourished and well-hydrated in no acute distress. Respiratory normal breathing without difficulty. clear to auscultation bilaterally. Cardiovascular regular rate and rhythm with normal S1, S2. Psychiatric this patient is able to make decisions and demonstrates good insight into disease process. Alert and Oriented x 3. pleasant and cooperative. Notes On evaluation today patient appears to be doing quite well with regard to her wound all things considered. We don't have a lot of granulation going on at this point but there's also not a lot of other issues either from the standpoint of more pain leaving increase in size. Overall I feel like the eschar is softening up some and maybe were seeing some signs of granulation underneath which is good news. Overall on the reckoning we continue with the Current wound care measures. Electronic Signature(s) Signed: 09/22/2019  9:52:07 AM By: Worthy Keeler PA-C Entered By: Worthy Keeler on 09/22/2019 09:39:06 Christina Blackburn ([id number]) -------------------------------------------------------------------------------- Physician Orders Details Patient Name: Christina Blackburn Date of Service: 09/22/2019 9:15 AM Medical Record Number: [id number] Patient Account Number: [id number] Date of Birth/Sex: January 03, 1946 (73 y.o. F) Treating RN: Montey Hora Primary Care Provider: [name] Other Clinician: Referring Provider: [name] Treating Provider/Extender: Melburn Hake, Maryon Kemnitz Weeks in Treatment: 59 Verbal / Phone Orders: No Diagnosis Coding ICD-10  Coding Code Description E11.622 Type 2 diabetes mellitus with other skin ulcer L97.828 Non-pressure chronic ulcer of other part of left lower leg with other specified severity I73.89 Other specified peripheral vascular diseases Wound Cleansing Wound #4 Left,Posterior Lower Leg o Clean wound with Normal Saline. o Dial antibacterial soap, wash wounds, rinse and pat dry prior to dressing wounds o May Shower, gently pat wound dry prior to applying new dressing. Primary Wound Dressing Wound #4 Left,Posterior Lower Leg o Iodoflex Secondary Dressing Wound #4 Left,Posterior Lower Leg o ABD pad - stretch netting to secure o Saline moistened gauze Dressing Change Frequency Wound #4 Left,Posterior Lower Leg o Change dressing every day. Follow-up Appointments Wound #4 Left,Posterior Lower Leg o Return Appointment in 1 week. Medications-please add to medication list. Wound #4 Left,Posterior Lower Leg o Other: - Americaine 20% benzocaine spray Electronic Signature(s) Signed: 09/22/2019 9:52:07 AM By: Worthy Keeler PA-C Signed: 09/22/2019 4:13:22 PM By: Montey Hora Entered By: Montey Hora on 09/22/2019 09:37:42 Christina Blackburn ([id number]) -------------------------------------------------------------------------------- Problem List Details Patient Name: Christina Blackburn Date of Service: 09/22/2019 9:15 AM Medical Record Number: [id number] Patient Account Number: [id number] Date of Birth/Sex: 1945-11-09 (73 y.o. F) Treating RN: Montey Hora Primary Care Provider: [name] Other Clinician: Referring Provider: [name] Treating Provider/Extender: Melburn Hake, Makynna Manocchio Weeks in Treatment: 13 Active Problems ICD-10 Evaluated Encounter Code Description Active Date Today Diagnosis E11.622 Type 2 diabetes mellitus with other skin ulcer 06/19/2019 No Yes L97.828 Non-pressure chronic ulcer of other part of left lower leg with 06/19/2019 No Yes other specified  severity I73.89 Other specified peripheral vascular diseases 06/19/2019 No Yes Inactive Problems Resolved Problems Electronic Signature(s) Signed: 09/22/2019 9:52:07 AM By: Worthy Keeler PA-C Entered By: Worthy Keeler on 09/22/2019 09:30:59 Christina Blackburn ([id number]) -------------------------------------------------------------------------------- Progress Note Details Patient Name: Christina Blackburn Date of Service: 09/22/2019 9:15 AM Medical Record Number: [id number] Patient Account Number: [id number] Date of Birth/Sex: 1946/06/06 (73 y.o. F) Treating RN: Montey Hora Primary Care Provider: [name] Other Clinician: Referring Provider: [name] Treating Provider/Extender: Melburn Hake, Jarrick Fjeld Weeks in Treatment: 13 Subjective Chief Complaint Information obtained from Patient Left LE Ulcer History of Present Illness (HPI) 06/19/2019 on evaluation today patient presents today for an initial evaluation in our clinic regarding a problem that first began in June 2020. She states that she saw a red tender spot on her lower extremity. Subsequently she saw dermatology towards the beginning to middle of July. Following this time she actually ended up going to see her primary care provider on July 31 at which point she was given an antibiotic cream she states. On August 28 she actually ended up going to urgent care because this was getting worse and was placed on doxycycline at that time. This was the first round of doxycycline. On 06/17/2019 she was actually seen in the emergency department at [facility] where she was given IV vancomycin and recommended to continue with Augmentin as well as doxycycline on an outpatient basis following. She did have an x-ray  performed at [facility] according to what she tells me today and this revealed that there was no signs of bone involvement with regard to infection. This obviously is excellent news. Right now she tells me she still having discomfort.  Unfortunately she also has significant issues with peripheral vascular disease which is also somewhat unfortunate. Especially in light of the fact that the wound is obviously on her lower extremity. She actually has an appointment to see vascular at [facility] for second opinion as well with regard to this that is not till sometime around the 21st of this month September 2020. The patient does have a history of diabetes mellitus type 2 along with again peripheral vascular disease. Her most recent TBI's revealed that her ABIs were noncompressible bilaterally with a right TBI of 0.35 and a left TBI of 0.23 obviously she does have fairly significant peripheral vascular disease in my opinion. This is post intervention including stent placement. 06/30/19 on evaluation today patient appears to be doing fairly well in regard to her wound on the left posterior lower extremity. She has been tolerating the dressing changes without complication which is good news. Fortunately there's no signs of active infection. She actually does have an appointment with vascular surgery [facility] later this month. 07/14/2019 on evaluation today actually did see the patient back for reevaluation. Unfortunately the wound actually appears to be getting larger not better with everything that we have been trying up to this point. She did see vascular at [facility] last week. They unfortunately stated that they did not fill in their opinion that the patient would benefit from revascularization attempts as far as getting this wound to heal. It was noted in the note that if she is not healing appropriately and has any worsening of the wound that she may come back for revascularization at that point but they were not confident that that would make a difference in the wound healing. Nonetheless they did tell her that they felt like the wound would be able to heal but just would take a very long time. The patient obviously is somewhat frustrated at this  point which I completely understand. The wound also is measuring larger today compared to last week. 07/21/2019 on evaluation today patient appears to be doing more poorly at this time with regard to her left lower extremity ulcer. She has been tolerating the dressing changes without complication. With that being said I really do not see any significant improvement at this time. There may also be some infection going on we did finally get the results of the culture back and it does show that she has 2 bacteria potentially causing issues coupled with the erythema and pain that she is experiencing I think that this needs to be addressed. As of right now all of the vascular specialist including the second opinion that she had both agree that they feel she can heal with her current blood flow without any need for intervention. She was told that she was "at 50% flow reaching the wound". 07/28/2019 on evaluation today patient appears to be doing well with regard to her left leg ulcer as compared to last week. The measurements are slightly larger but at the same time she seems to be doing better with regard to the fact that she is having just a little itching much less pain much less redness and overall she is much happier with what she is seeing. No fevers, chills, nausea, vomiting, or diarrhea. Christina Blackburn, Christina Blackburn ([id number]) 08/04/2019 on  evaluation today patient appears to be doing well with regard to her wound on her leg at this time in regard to there being no signs of infection. With that being said she is having continued discomfort especially she tells me at nighttime. There does not appear to be any signs of active infection systemically either which is good news. There is necrotic tissue noted on the surface of the wound I am going to see if we can potentially debride some of this away today. 10/30; patient arrives back in clinic for a wound on her left posterior calf. She states that she is in  extreme pain especially at night. Feels better when she is up walking but states she "cannot walk all night". She has had extensive arterial reviews including at [facility]. I saw their note briefly that says that she should have enough blood flow to heal these wounds I did not formally review her reports. She is using Santyl. She states that the debridement last time was excruciating. She once again has necrotic tissue on the wound surface 08/28/2019 on evaluation today patient actually is seen by way of a telehealth visit secondary to the [place] We were going to do telehealth visits to keep her treatment going and ensure that everything was doing okay. With that being said she unfortunately had an issue where her wound became increasingly painful and subsequently she ended up going to the ER and [place] She tells me that they did place her on doxycycline and gave her a prescription for hydrocodone as well for the pain. Fortunately there is no signs of active infection at this time systemically although she has not heard back from the wound culture as of yet. 09/15/2019 on evaluation today patient presents for reevaluation here in our clinic [place] she was there and fortunately her wound got significantly worse. She did not being seen at the wound care center there which is at [facility] care. Subsequently she states that she ended up with the wound increasing in size and having quite a bit of discomfort unfortunately. The patient tells me that at this point she has been very concerned with the fact that again she is having increased pain and continual pain she sleeps about 2 hours a night. She really does not have anything from the standpoint of pain management at this time to help her out unfortunately. She is concerned that again  the doctor there at [facility] care also was concerned about her vascular flow they felt like this was an ischemic ulcer. Again that was my assumption as well based on what I am seeing. I guess there is always a chance this could be something such as calciphylaxis but I am very concerned about the blood flow and is it sufficient to allow this area to heal and why is it getting worse that is larger despite the fact everything that has been done. They continue to use Iodoflex/Iodosorb for the patient in the interim. She is supposed to be having a follow-up appointment with her vascular doctor at [facility] on 10 December which is 9 days away. With that being said she does not know what to do as they have stated they felt like she had sufficient blood flow to heal the wounds and again from a wound care perspective were really  seeing something that seems somewhat different. Either way the patient states that she is having significant issues with continued and ongoing discomfort as well as enlargement of the wound itself. 09/22/19 on evaluation today patient appears to be doing well with regard to her wound all things considered. She's been having a lot of issues with pain and eschar. With that being said there's no signs of active infection at this time which is good news. No fevers, chills, nausea, or vomiting noted at this time. Patient History Information obtained from Patient. Family History Cancer - Siblings,Mother, Hypertension - Father, Kidney Disease - Father, Lung Disease - Father, Stroke - Maternal Grandparents, No family history of Diabetes, Heart Disease, Hereditary Spherocytosis, Seizures, Thyroid Problems, Tuberculosis. Social History Former smoker - ended on 10/15/2006, Marital Status - Married, Alcohol Use - Never, Drug Use - No History, Caffeine Use - Daily. Medical History Eyes Denies history of Cataracts, Glaucoma, Optic Neuritis Ear/Nose/Mouth/Throat Denies history of Chronic  sinus problems/congestion, Middle ear problems Hematologic/Lymphatic Denies history of Anemia, Hemophilia, Human Immunodeficiency Virus, Lymphedema, Sickle Cell Disease Respiratory Patient has history of Chronic Obstructive Pulmonary Disease (COPD) Christina Blackburn, Christina Blackburn ([id number]) Denies history of Aspiration, Asthma, Pneumothorax, Sleep Apnea, Tuberculosis Cardiovascular Patient has history of Coronary Artery Disease - Stent placed 2009, Hypertension Denies history of Angina, Arrhythmia, Congestive Heart Failure, Deep Vein Thrombosis, Hypotension, Myocardial Infarction, Peripheral Arterial Disease, Peripheral Venous Disease, Phlebitis, Vasculitis Gastrointestinal Denies history of Cirrhosis , Colitis, Crohn s, Hepatitis A, Hepatitis B, Hepatitis C Endocrine Patient has history of Type II Diabetes - 2004 Denies history of Type I Diabetes Genitourinary Denies history of End Stage Renal Disease Immunological Denies history of Lupus Erythematosus, Raynaud s, Scleroderma Integumentary (Skin) Denies history of History of Burn, History of pressure wounds Musculoskeletal Denies history of Gout, Rheumatoid Arthritis, Osteoarthritis, Osteomyelitis Neurologic Denies history of Dementia, Neuropathy, Quadriplegia, Paraplegia, Seizure Disorder Oncologic Denies history of Received Chemotherapy, Received Radiation Psychiatric Denies history of Anorexia/bulimia, Confinement Anxiety Hospitalization/Surgery History - Stent placed. - gall bladder. - hysterectomy. - c-section x2. - ARM 2 stents placed 10/2018. Review of Systems (ROS) Constitutional Symptoms (General Health) Denies complaints or symptoms of Fatigue, Fever, Chills, Marked Weight Change. Respiratory Denies complaints or symptoms of Chronic or frequent coughs, Shortness of Breath. Cardiovascular Denies complaints or symptoms of Chest pain, LE edema. Psychiatric Denies complaints or symptoms of Anxiety,  Claustrophobia. Objective Constitutional Well-nourished and well-hydrated in no acute distress. Vitals Time Taken: 9:20 AM, Height: 59 in, Weight: 170 lbs, BMI: 34.3, Temperature: 98.6 F, Pulse: 92 bpm, Respiratory Rate: 18 breaths/min, Blood Pressure: 166/77 mmHg. Respiratory normal breathing without difficulty. clear to auscultation bilaterally. Cardiovascular regular rate and rhythm with normal S1, S2. Christina Blackburn, Christina Blackburn ([id number]) Psychiatric this patient is able to make decisions and demonstrates good insight into disease process. Alert and Oriented x 3. pleasant and cooperative. General Notes: On evaluation today patient appears to be doing quite well with regard to her wound all things considered. We don't have a lot of granulation going on at this point but there's also not a lot of other issues either from the standpoint of more pain leaving increase in size. Overall I feel like the eschar is softening up some and maybe were seeing some signs of granulation underneath which is good news. Overall on the reckoning we continue with the Current wound care measures. Integumentary (Hair, Skin) Wound #4 status is Open. Original cause of wound was Gradually Appeared. The wound is located on the Left,Posterior Lower Leg. The wound  measures 6.4cm length x 8.1cm width x 0.1cm depth; 40.715cm^2 area and 4.072cm^3 volume. There is Fat Layer (Subcutaneous Tissue) Exposed exposed. There is no tunneling or undermining noted. There is a medium amount of serous drainage noted. The wound margin is flat and intact. There is no granulation within the wound bed. There is a large (67-100%) amount of necrotic tissue within the wound bed including Eschar and Adherent Slough. 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 Dial antibacterial soap, wash wounds, rinse and pat dry prior to dressing wounds May Shower, gently pat wound dry prior to applying new dressing. Primary Wound Dressing: Wound #4 Left,Posterior Lower Leg: Iodoflex Secondary Dressing: Wound #4 Left,Posterior Lower Leg: ABD pad - stretch netting to secure Saline moistened gauze Dressing Change Frequency: Wound #4 Left,Posterior Lower Leg: Change dressing every day. Follow-up Appointments: Wound #4 Left,Posterior Lower Leg: Return Appointment in 1 week. Medications-please add to medication list.: Wound #4 Left,Posterior Lower Leg: Other: - Americaine 20% benzocaine spray Christina Blackburn, Christina Blackburn ([id number]) At this time my suggestion for the patient is gonna be that we go ahead and continue with the Current wound care measures which includes the Iodoflex I think that is doing the best. I'm in also suggests that the patient continues with cover this with an ABD pad as well as stretch netting in order to keep things under control. We will see how things appear next week. The patient is in agreement that plan. Please see above for specific wound care orders. We will see patient for re-evaluation in 1 week(s) here in the clinic. If anything worsens or changes patient will contact our office for additional recommendations. Patient was seen today by way of telehealth by myself as I was working remote due to being screened for Covid-19 Virus patient was seen in the clinic. Electronic Signature(s) Signed: 09/22/2019 9:52:07 AM By: Worthy Keeler PA-C Entered By: Worthy Keeler on 09/22/2019 09:40:31 Christina Blackburn ([id number]) -------------------------------------------------------------------------------- ROS/PFSH Details Patient Name: Christina Blackburn Date of Service: 09/22/2019 9:15 AM Medical Record Number: [id number] Patient Account Number: [id number] Date  of Birth/Sex: August 13, 1946 (73 y.o. F) Treating RN: Montey Hora Primary Care Provider: [name] Other Clinician: Referring Provider: [name] Treating Provider/Extender: Melburn Hake, Izabela Ow Weeks in Treatment: 13 Information Obtained From Patient Constitutional Symptoms (General Health) Complaints and Symptoms: Negative for: Fatigue; Fever; Chills; Marked Weight Change Respiratory Complaints and Symptoms: Negative for: Chronic or frequent coughs; Shortness of Breath Medical History: Positive for: Chronic Obstructive Pulmonary Disease (COPD) Negative for: Aspiration; Asthma; Pneumothorax; Sleep Apnea; Tuberculosis Cardiovascular Complaints and Symptoms: Negative for: Chest pain; LE edema Medical History: Positive for: Coronary Artery Disease - Stent placed 2009; Hypertension Negative for: Angina; Arrhythmia; Congestive Heart Failure; Deep Vein Thrombosis; Hypotension; Myocardial Infarction; Peripheral Arterial Disease; Peripheral Venous Disease; Phlebitis; Vasculitis Psychiatric Complaints and Symptoms: Negative for: Anxiety; Claustrophobia Medical History: Negative for: Anorexia/bulimia; Confinement Anxiety Eyes Medical History: Negative for: Cataracts; Glaucoma; Optic Neuritis Ear/Nose/Mouth/Throat Medical History: Negative for: Chronic sinus problems/congestion; Middle ear problems Hematologic/Lymphatic Medical History: Negative for: Anemia; Hemophilia; Human Immunodeficiency Virus; Lymphedema; Sickle Cell Disease Christina Blackburn, Christina Blackburn ([id number]) Gastrointestinal Medical History: Negative for: Cirrhosis ; Colitis; Crohnos; Hepatitis A; Hepatitis B; Hepatitis C Endocrine Medical History: Positive for: Type II Diabetes - 2004 Negative for: Type I Diabetes  Time with diabetes: 15 years Treated with: Oral agents Blood sugar tested every day: Yes Tested : 2-3 times daily Blood sugar testing results: Breakfast: 84 Genitourinary Medical History: Negative for:  End Stage Renal Disease Immunological Medical History: Negative for: Lupus Erythematosus; Raynaudos; Scleroderma Integumentary (Skin) Medical History: Negative for: History of Burn; History of pressure wounds Musculoskeletal Medical History: Negative for: Gout; Rheumatoid Arthritis; Osteoarthritis; Osteomyelitis Neurologic Medical History: Negative for: Dementia; Neuropathy; Quadriplegia; Paraplegia; Seizure Disorder Oncologic Medical History: Negative for: Received Chemotherapy; Received Radiation Immunizations Pneumococcal Vaccine: Received Pneumococcal Vaccination: Yes Tetanus Vaccine: Last tetanus shot: 06/19/2013 Implantable Devices None Hospitalization / Surgery History Type of Hospitalization/Surgery Stent placed Christina Blackburn, Christina Blackburn ([id number]) gall bladder hysterectomy c-section x2 ARM 2 stents placed 10/2018 Family and Social History Cancer: Yes - Siblings,Mother; Diabetes: No; Heart Disease: No; Hereditary Spherocytosis: No; Hypertension: Yes - Father; Kidney Disease: Yes - Father; Lung Disease: Yes - Father; Seizures: No; Stroke: Yes - Maternal Grandparents; Thyroid Problems: No; Tuberculosis: No; Former smoker - ended on 10/15/2006; Marital Status - Married; Alcohol Use: Never; Drug Use: No History; Caffeine Use: Daily; Financial Concerns: No; Food, Clothing or Shelter Needs: No; Support System Lacking: No; Transportation Concerns: No Physician Affirmation I have reviewed and agree with the above information. Electronic Signature(s) Signed: 09/22/2019 9:52:07 AM By: Worthy Keeler PA-C Signed: 09/22/2019 4:13:22 PM By: Montey Hora Entered By: Worthy Keeler on 09/22/2019 09:38:49 Christina Blackburn ([id number]) -------------------------------------------------------------------------------- SuperBill Details Patient Name: Christina Blackburn Date of Service: 09/22/2019 Medical Record Number: [id number] Patient Account Number: [id number] Date of Birth/Sex:  Aug 18, 1946 (73 y.o. F) Treating RN: Montey Hora Primary Care Provider: [name] Other Clinician: Referring Provider: [name] Treating Provider/Extender: Melburn Hake, Doneshia Hill Weeks in Treatment: 13 Diagnosis Coding ICD-10 Codes Code Description E11.622 Type 2 diabetes mellitus with other skin ulcer L97.828 Non-pressure chronic ulcer of other part of left lower leg with other specified severity I73.89 Other specified peripheral vascular diseases Facility Procedures CPT4 Code: [id number] Description: [id number] - WOUND CARE VISIT-LEV 3 EST PT Modifier: Quantity: 1 CPT4 Code: [id number] Description: [id number] Telehealth originating site facility fee. Modifier: Quantity: 1 Physician Procedures CPT4 Code Description: [id number] - WC PHYS LEVEL 3 - EST PT ICD-10 Diagnosis Description E11.622 Type 2 diabetes mellitus with other skin ulcer L97.828 Non-pressure chronic ulcer of other part of left lower leg with I73.89 Other specified peripheral  vascular diseases Modifier: other specified s Quantity: 1 everity Electronic Signature(s) Signed: 09/22/2019 11:25:47 AM By: Gretta Cool, BSN, RN, CWS, Kim RN, BSN Signed: 09/22/2019 12:03:29 PM By: Worthy Keeler PA-C Previous Signature: 09/22/2019 9:52:07 AM Version By: Worthy Keeler PA-C Entered By: Gretta Cool, BSN, RN, CWS, Kim on 09/22/2019 11:25:46

## 2019-09-23 DIAGNOSIS — M79605 Pain in left leg: Secondary | ICD-10-CM | POA: Diagnosis not present

## 2019-09-23 DIAGNOSIS — G8929 Other chronic pain: Secondary | ICD-10-CM | POA: Diagnosis not present

## 2019-09-23 DIAGNOSIS — S81802D Unspecified open wound, left lower leg, subsequent encounter: Secondary | ICD-10-CM | POA: Diagnosis not present

## 2019-09-23 DIAGNOSIS — I739 Peripheral vascular disease, unspecified: Secondary | ICD-10-CM | POA: Diagnosis not present

## 2019-09-23 DIAGNOSIS — E119 Type 2 diabetes mellitus without complications: Secondary | ICD-10-CM | POA: Diagnosis not present

## 2019-09-24 ENCOUNTER — Other Ambulatory Visit: Payer: Self-pay

## 2019-09-24 ENCOUNTER — Ambulatory Visit
Admission: RE | Admit: 2019-09-24 | Discharge: 2019-09-24 | Disposition: A | Discharge status: Home / Self Care | Payer: PPO | Source: Ambulatory Visit | Attending: Internal Medicine | Admitting: Internal Medicine

## 2019-09-24 ENCOUNTER — Ambulatory Visit (INDEPENDENT_AMBULATORY_CARE_PROVIDER_SITE_OTHER): Payer: PPO | Admitting: Nurse Practitioner

## 2019-09-24 ENCOUNTER — Ambulatory Visit (INDEPENDENT_AMBULATORY_CARE_PROVIDER_SITE_OTHER): Payer: PPO

## 2019-09-24 ENCOUNTER — Encounter (INDEPENDENT_AMBULATORY_CARE_PROVIDER_SITE_OTHER): Payer: Self-pay | Admitting: Nurse Practitioner

## 2019-09-24 VITALS — BP 182/78 | HR 76 | Resp 16 | Wt 169.6 lb

## 2019-09-24 DIAGNOSIS — I70213 Atherosclerosis of native arteries of extremities with intermittent claudication, bilateral legs: Secondary | ICD-10-CM

## 2019-09-24 DIAGNOSIS — I10 Essential (primary) hypertension: Secondary | ICD-10-CM | POA: Diagnosis not present

## 2019-09-24 DIAGNOSIS — Z1231 Encounter for screening mammogram for malignant neoplasm of breast: Secondary | ICD-10-CM | POA: Insufficient documentation

## 2019-09-24 DIAGNOSIS — I739 Peripheral vascular disease, unspecified: Secondary | ICD-10-CM | POA: Diagnosis not present

## 2019-09-24 DIAGNOSIS — I509 Heart failure, unspecified: Secondary | ICD-10-CM | POA: Insufficient documentation

## 2019-09-24 DIAGNOSIS — I4510 Unspecified right bundle-branch block: Secondary | ICD-10-CM | POA: Insufficient documentation

## 2019-09-24 DIAGNOSIS — E1151 Type 2 diabetes mellitus with diabetic peripheral angiopathy without gangrene: Secondary | ICD-10-CM

## 2019-09-25 ENCOUNTER — Telehealth (INDEPENDENT_AMBULATORY_CARE_PROVIDER_SITE_OTHER): Payer: Self-pay

## 2019-09-25 ENCOUNTER — Encounter (INDEPENDENT_AMBULATORY_CARE_PROVIDER_SITE_OTHER): Payer: Self-pay

## 2019-09-25 NOTE — Telephone Encounter (Signed)
I attempted to contact the patient and a message was left for a return call. 

## 2019-09-28 ENCOUNTER — Other Ambulatory Visit: Payer: Self-pay | Admitting: Internal Medicine

## 2019-09-28 ENCOUNTER — Encounter (INDEPENDENT_AMBULATORY_CARE_PROVIDER_SITE_OTHER): Payer: Self-pay | Admitting: Nurse Practitioner

## 2019-09-28 DIAGNOSIS — R928 Other abnormal and inconclusive findings on diagnostic imaging of breast: Secondary | ICD-10-CM

## 2019-09-28 NOTE — Progress Notes (Signed)
SUBJECTIVE:  Patient ID: Christina Blackburn, female    DOB: [birth year], 73 y.o.   MRN: [id number] Chief Complaint  Patient presents with  . Follow-up    75month ultrasound follow up    HPI  Christina Blackburn is a 73 y.o. female   that presents today for noninvasive studies related to her peripheral vascular disease.  The patient underwent a bilateral femoral endarterectomy on 11/12/2018.  Unfortunately, the wound subsequently dehisced and and was complicated with infection with seated wound debridement and wound VAC placement.  Currently the wounds are completely healed.  Unfortunately, the patient had a new wound developed on her left lower extremity calf.  The patient states that it started about the size of a dime and subsequently became larger over time.  She states that the wound center has been caring for her new ulceration however after debridement it quickly continue to get even worse.  She states that the wound center feels that her worsening wounds are likely due to arterial insufficiency.  However, the patient went to [facility] for a second opinion in regards to her vascular status and they felt that at that in October the patient had adequate blood flow to heal her wound.  Today her wound is fairly extensive behind her calf.  See pictures attached below.  She denies any fever, chills, nausea, vomiting or diarrhea.  She continues to go to the wound center for treatment.  The patient underwent bilateral ABIs today.  ABI is 0.74 on the right and 0.64 on the left.  She has a TBI 0.49 on her right and 0.23 on her left.  She has strong monophasic waveforms on the right tibial arteries with slightly dampened right toe waveforms.  The left lower extremity has moderate monophasic waveforms with a nearly flat left toe digit.  Past Medical History:  Diagnosis Date  . Anemia   . CHF (congestive heart failure) ([place])   . Chicken pox   . Colon polyps   . Complication of anesthesia   . Coronary  artery disease   . Cystitis 1986  . Diabetes mellitus without complication ([place]) [id number]  . Diverticulosis   . Heart disease 2008  . Hyperlipidemia   . Hypertension 2003  . PAD (peripheral artery disease) ([place])   . PONV (postoperative nausea and vomiting)   . Rotator cuff tendinitis   . Squamous cell skin cancer 2016    Past Surgical History:  Procedure Laterality Date  . ABDOMINAL HYSTERECTOMY  1989  . APPLICATION OF WOUND VAC Bilateral 12/02/2018   Procedure: APPLICATION OF WOUND VAC;  Surgeon: Katha Cabal, MD;  Location: [facility] ORS;  Service: Vascular;  Laterality: Bilateral;  . BREAST BIOPSY Left 2014   neg  . CARDIAC CATHETERIZATION     cornary stent  . [place]  . CHOLECYSTECTOMY  1987  . COLONOSCOPY    . COLONOSCOPY WITH PROPOFOL N/A 09/22/2018   Procedure: COLONOSCOPY WITH PROPOFOL;  Surgeon: Lollie Sails, MD;  Location: [facility] ENDOSCOPY;  Service: Endoscopy;  Laterality: N/A;  . CORONARY ANGIOPLASTY  2008   stent x1  . ENDARTERECTOMY FEMORAL Bilateral 11/12/2018   Procedure: ENDARTERECTOMY FEMORAL;  Surgeon: Katha Cabal, MD;  Location: [facility] ORS;  Service: Vascular;  Laterality: Bilateral;  . INSERTION OF ILIAC STENT Bilateral 11/12/2018   Procedure: INSERTION OF ILIAC STENT;  Surgeon: Katha Cabal, MD;  Location: [facility] ORS;  Service: Vascular;  Laterality: Bilateral;  . JOINT REPLACEMENT Right 04/12/2018  shoulder  . LOWER EXTREMITY ANGIOGRAPHY Left 08/05/2018   Procedure: LOWER EXTREMITY ANGIOGRAPHY;  Surgeon: Katha Cabal, MD;  Location: [place] CV LAB;  Service: Cardiovascular;  Laterality: Left;  . REVERSE SHOULDER ARTHROPLASTY Right 04/08/2018   Procedure: REVERSE SHOULDER ARTHROPLASTY;  Surgeon: Corky Mull, MD;  Location: [facility] ORS;  Service: Orthopedics;  Laterality: Right;  . TUBAL LIGATION    . WOUND DEBRIDEMENT Bilateral 12/02/2018   Procedure: DEBRIDEMENT WOUND;  Surgeon: Katha Cabal, MD;  Location:  [facility] ORS;  Service: Vascular;  Laterality: Bilateral;    Social History   Socioeconomic History  . Marital status: Widowed    Spouse name: Not on file  . Number of children: Not on file  . Years of education: Not on file  . Highest education level: Not on file  Occupational History  . Occupation: Retired    Comment: Geologist, engineering.   Tobacco Use  . Smoking status: Former Smoker    Years: 6.00    Types: Cigarettes    Quit date: 2008    Years since quitting: 12.9  . Smokeless tobacco: Never Used  Substance and Sexual Activity  . Alcohol use: Yes    Comment: rare  . Drug use: No  . Sexual activity: Not on file  Other Topics Concern  . Not on file  Social History Narrative  . Not on file   Social Determinants of Health   Financial Resource Strain:   . Difficulty of Paying Living Expenses: Not on file  Food Insecurity:   . Worried About Charity fundraiser in the Last Year: Not on file  . Ran Out of Food in the Last Year: Not on file  Transportation Needs:   . Lack of Transportation (Medical): Not on file  . Lack of Transportation (Non-Medical): Not on file  Physical Activity:   . Days of Exercise per Week: Not on file  . Minutes of Exercise per Session: Not on file  Stress:   . Feeling of Stress : Not on file  Social Connections:   . Frequency of Communication with Friends and Family: Not on file  . Frequency of Social Gatherings with Friends and Family: Not on file  . Attends Religious Services: Not on file  . Active Member of Clubs or Organizations: Not on file  . Attends Archivist Meetings: Not on file  . Marital Status: Not on file  Intimate Partner Violence:   . Fear of Current or Ex-Partner: Not on file  . Emotionally Abused: Not on file  . Physically Abused: Not on file  . Sexually Abused: Not on file    Family History  Problem Relation Age of Onset  . Colon cancer Cousin 60  . Lymphoma Brother 64  . Lung cancer Mother   . Hypertension Father    . Kidney failure Father   . Breast cancer Neg Hx     Allergies  Allergen Reactions  . Tape Other (See Comments)    Hypoallergenic tape - blisters Other reaction(s): Other (See Comments) Blisters     Review of Systems   Review of Systems: Negative Unless Checked Constitutional: [] Weight loss  [] Fever  [] Chills Cardiac: [] Chest pain   []  Atrial Fibrillation  [] Palpitations   [] Shortness of breath when laying flat   [] Shortness of breath with exertion. [] Shortness of breath at rest Vascular:  [] Pain in legs with walking   [] Pain in legs with standing [] Pain in legs when laying flat   [] Claudication    []   Pain in feet when laying flat    [] History of DVT   [] Phlebitis   [] Swelling in legs   [] Varicose veins   [x] Non-healing ulcers Pulmonary:   [] Uses home oxygen   [] Productive cough   [] Hemoptysis   [] Wheeze  [] COPD   [] Asthma Neurologic:  [] Dizziness   [] Seizures  [] Blackouts [] History of stroke   [] History of TIA  [] Aphasia   [] Temporary Blindness   [] Weakness or numbness in arm   [] Weakness or numbness in leg Musculoskeletal:   [] Joint swelling   [] Joint pain   [] Low back pain  []  History of Knee Replacement [] Arthritis [] back Surgeries  []  Spinal Stenosis    Hematologic:  [] Easy bruising  [] Easy bleeding   [] Hypercoagulable state   [x] Anemic Gastrointestinal:  [] Diarrhea   [] Vomiting  [] Gastroesophageal reflux/heartburn   [] Difficulty swallowing. [] Abdominal pain Genitourinary:  [] Chronic kidney disease   [] Difficult urination  [] Anuric   [] Blood in urine [] Frequent urination  [] Burning with urination   [] Hematuria Skin:  [] Rashes   [x] Ulcers [x] Wounds Psychological:  [] History of anxiety   []  History of major depression  []  Memory Difficulties      OBJECTIVE:   Physical Exam  BP (!) 182/78 (BP Location: Right Arm)   Pulse 76   Resp 16   Wt 169 lb 9.6 oz (76.9 kg)   BMI 34.26 kg/m   Gen: WD/WN, NAD Head: [place]/AT, No temporalis wasting.  Ear/Nose/Throat: Hearing grossly  intact, nares w/o erythema or drainage Eyes: PER, EOMI, sclera nonicteric.  Neck: Supple, no masses.  No JVD.  Pulmonary:  Good air movement, no use of accessory muscles.  Cardiac: RRR Vascular:  see photos for wounds Vessel Right Left  Dorsalis Pedis Not Palpable Not Palpable  Posterior Tibial Not Palpable Not Palpable   Gastrointestinal: soft, non-distended. No guarding/no peritoneal signs.  Musculoskeletal: M/S 5/5 throughout.  No deformity or atrophy.  Neurologic: Pain and light touch intact in extremities.  Symmetrical.  Speech is fluent. Motor exam as listed above. Psychiatric: Judgment intact, Mood & affect appropriate for pt's clinical situation. Dermatologic: No Venous rashes.   No changes consistent with cellulitis. Lymph : No Cervical lymphadenopathy, no lichenification or skin changes of chronic lymphedema.           ASSESSMENT AND PLAN:  1. PAD (peripheral artery disease) (HCC)  Recommend:  Had a long discussion with the patient regarding her atherosclerotic disease.  Based upon her noninvasive studies today as well as the progression of her wound despite care from the wound center is a strong indication that her nonwound healing is strongly related to arterial insufficiency.  I discussed with the patient the risks if she were to undergo the procedure versus if she decided against it.  Not undergoing angiogram could result in further wound worsening, tissue damage and possible limb amputation.  However, if the angiogram were show no vascular compromise, the risk of the procedure is low compared to not undergoing the procedure.  Patient should undergo angiography of the lower extremities with the hope for intervention for limb salvage.  The risks and benefits as well as the alternative therapies was discussed in detail with the patient.  All questions were answered.  Patient agrees to proceed with angiography.  The patient will follow up with me in the office after the  procedure.   2. Essential hypertension Continue antihypertensive medications as already ordered, these medications have been reviewed and there are no changes at this time.   3. Type 2 diabetes mellitus with  diabetic peripheral angiopathy without gangrene, without long-term current use of insulin (HCC) Continue hypoglycemic medications as already ordered, these medications have been reviewed and there are no changes at this time.  Hgb A1C to be monitored as already arranged by primary service    Current Outpatient Medications on File Prior to Visit  Medication Sig Dispense Refill  . amLODipine (NORVASC) 5 MG tablet Take 5 mg by mouth daily.    Marland Kitchen aspirin 81 MG tablet Take 81 mg by mouth daily.    Marland Kitchen atorvastatin (LIPITOR) 20 MG tablet Take 20 mg by mouth daily.     . calcium carbonate (OS-CAL) 600 MG TABS tablet Take 600 mg by mouth 2 (two) times daily with a meal.    . Cholecalciferol (VITAMIN D3 PO) Take 400 Units by mouth daily.     . clopidogrel (PLAVIX) 75 MG tablet Take 75 mg by mouth daily.    . cyclobenzaprine (FLEXERIL) 10 MG tablet Take 10 mg by mouth 3 (three) times daily as needed for muscle spasms.    . DULoxetine (CYMBALTA) 30 MG capsule Take 30 mg by mouth daily.    . fenofibrate (TRICOR) 145 MG tablet Take 145 mg by mouth daily.    . fish oil-omega-3 fatty acids 1000 MG capsule Take 1 g by mouth daily.    . furosemide (LASIX) 20 MG tablet Take 20 mg by mouth as needed.    . gabapentin (NEURONTIN) 100 MG capsule Take 100 mg by mouth 3 (three) times daily.    Marland Kitchen glimepiride (AMARYL) 4 MG tablet Take 4 mg by mouth daily with breakfast.     . losartan-hydrochlorothiazide (HYZAAR) 100-25 MG tablet Take 1 tablet by mouth daily.     . magnesium oxide (MAG-OX) 400 MG tablet Take 400 mg by mouth daily.    . metFORMIN (GLUCOPHAGE) 1000 MG tablet Take 1,000 mg by mouth daily with breakfast.     . metoprolol succinate (TOPROL-XL) 100 MG 24 hr tablet Take 100 mg by mouth daily. Take  with or immediately following a meal.    . OVER THE COUNTER MEDICATION Take 2 tablets by mouth at bedtime. Mag R&R Supplement    . traZODone (DESYREL) 50 MG tablet Take 50 mg by mouth at bedtime.    . TURMERIC PO Take by mouth daily.    . vitamin B-12 (CYANOCOBALAMIN) 1000 MCG tablet Take 1,000 mcg by mouth daily.    . vitamin E 400 UNIT capsule Take 400 Units by mouth daily.     Marland Kitchen doxycycline (VIBRAMYCIN) 100 MG capsule Take 1 capsule (100 mg total) by mouth 2 (two) times daily. (Patient not taking: Reported on 09/24/2019) 14 capsule 0  . HYDROcodone-acetaminophen (NORCO/VICODIN) 5-325 MG tablet 1 tab po bid prn (Patient not taking: Reported on 09/24/2019) 12 tablet 0  . nystatin cream (MYCOSTATIN) Apply 1 application topically 2 (two) times daily. (Patient not taking: Reported on 09/24/2019) 30 g 0   No current facility-administered medications on file prior to visit.    There are no Patient Instructions on file for this visit. No follow-ups on file.   Kris Hartmann, NP  This note was completed with Sales executive.  Any errors are purely unintentional.

## 2019-09-29 ENCOUNTER — Encounter: Payer: PPO | Admitting: Physician Assistant

## 2019-09-30 ENCOUNTER — Ambulatory Visit
Admission: EM | Admit: 2019-09-30 | Discharge: 2019-09-30 | Disposition: A | Discharge status: Home / Self Care | Payer: PPO | Attending: Family Medicine | Admitting: Family Medicine

## 2019-09-30 ENCOUNTER — Encounter: Payer: Self-pay | Admitting: Emergency Medicine

## 2019-09-30 ENCOUNTER — Other Ambulatory Visit: Payer: Self-pay

## 2019-09-30 DIAGNOSIS — B349 Viral infection, unspecified: Secondary | ICD-10-CM

## 2019-09-30 DIAGNOSIS — Z20828 Contact with and (suspected) exposure to other viral communicable diseases: Secondary | ICD-10-CM

## 2019-09-30 DIAGNOSIS — Z7189 Other specified counseling: Secondary | ICD-10-CM

## 2019-09-30 NOTE — ED Provider Notes (Addendum)
[facility] ____________________________________________  Time seen: Approximately 5:51 PM  I have reviewed the triage vital signs and the nursing notes.   HISTORY  Chief Complaint Fever   HPI Christina Blackburn is a 73 y.o. female presenting for COVID-19 testing.  Patient reports for the last 2 days she has had low-grade fevers of T-max 100.9.  States she has some nasal congestion but not profuse.  Denies cough, sore throat, headache, vomiting, diarrhea, chest pain, shortness of breath, changes in taste or smell, dysuria, abdominal pain, rash or other acute changes.  States overall she feels well.  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  Has taken over-the-counter Tylenol, last took around 3:00 this afternoon.  Continues eat and drink well.  Denies other complaints.  States that she has a wound to her left lower leg that she follows vascular with without any changes; denies redness or pain.  Idelle Crouch, MD : PCP   Past Medical History:  Diagnosis Date  . Anemia   . CHF (congestive heart failure) ([place])   . Chicken pox   . Colon polyps   . Complication of anesthesia   . Coronary artery disease   . Cystitis 1986  . Diabetes mellitus without complication ([place]) 99991111  . Diverticulosis   . Heart disease 2008  . Hyperlipidemia   . Hypertension 2003  . PAD (peripheral artery disease) ([place])   . PONV (postoperative nausea and vomiting)   . Rotator cuff tendinitis   . Squamous cell skin cancer 2016    Patient Active Problem List   Diagnosis Date Noted  . CHF (congestive heart failure) ([place]) 09/24/2019  . Cellulitis 06/17/2019  . Dermatitis 03/23/2019  . Surgical site infection 11/19/2018  . Atherosclerosis of native arteries of extremity with intermittent claudication ([place]) 11/12/2018  . Preop cardiovascular exam 09/18/2018  . Atherosclerosis of native arteries of  extremity with rest pain ([place]) 07/16/2018  . Obesity (BMI 30.0-34.9) 04/22/2018  . 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 Rotator cuff tendinitis, left 03/26/2018  . Spondylosis of cervical region without myelopathy or radiculopathy 03/26/2018  . PAD (peripheral artery disease) ([place]) 05/16/2017  . Diabetes ([place]) 05/16/2017  . Essential hypertension 05/16/2017  . Hyperlipidemia 05/16/2017  . Hx of cardiac cath 05/16/2015  . ASCVD (arteriosclerotic cardiovascular disease) 03/25/2014  . H/O CHF 03/25/2014  . H/O: hysterectomy 03/25/2014  . 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 ABDOMINAL HYSTERECTOMY  1989  . APPLICATION OF WOUND VAC Bilateral 12/02/2018   Procedure: APPLICATION OF WOUND VAC;  Surgeon: Katha Cabal, MD;  Location: [facility] ORS;  Service: Vascular;  Laterality: Bilateral;  . BREAST BIOPSY Left 2014   neg  . CARDIAC CATHETERIZATION     cornary stent  . [place]  . CHOLECYSTECTOMY  1987  . COLONOSCOPY    . COLONOSCOPY WITH PROPOFOL N/A 09/22/2018   Procedure: COLONOSCOPY WITH PROPOFOL;  Surgeon: Lollie Sails, MD;  Location: [facility];  Service: Endoscopy;  Laterality: N/A;  . CORONARY ANGIOPLASTY  2008   stent x1  . ENDARTERECTOMY FEMORAL Bilateral 11/12/2018   Procedure: ENDARTERECTOMY FEMORAL;  Surgeon: Katha Cabal, MD;  Location: [facility] ORS;  Service: Vascular;  Laterality: Bilateral;  . INSERTION OF ILIAC STENT Bilateral 11/12/2018   Procedure: INSERTION OF ILIAC STENT;  Surgeon: Katha Cabal, MD;  Location: [facility];  Service: Vascular;  Laterality: Bilateral;  . JOINT REPLACEMENT Right 04/12/2018   shoulder  . LOWER EXTREMITY ANGIOGRAPHY Left 08/05/2018   Procedure: LOWER EXTREMITY ANGIOGRAPHY;  Surgeon: Katha Cabal, MD;  Location: [facility];  Service: Cardiovascular;  Laterality: Left;  . REVERSE SHOULDER ARTHROPLASTY  Right 04/08/2018   Procedure: REVERSE SHOULDER ARTHROPLASTY;  Surgeon: Corky Mull, MD;  Location: [facility];  Service: Orthopedics;  Laterality: Right;  . TUBAL LIGATION    . WOUND DEBRIDEMENT Bilateral 12/02/2018   Procedure: DEBRIDEMENT WOUND;  Surgeon: Katha Cabal, MD;  Location: [facility] OR[facility];  Service: Vascular;  Laterality: Bilateral;     No current facility-administered medications for this encounter.  Current Outpatient Medications:  .  acetaminophen (TYLENOL) 500 MG tablet, Take 1,000 mg by mouth every 6 (six) hours as needed for moderate pain or headache., Disp: , Rfl:  .  amLODipine (NORVASC) 5 MG tablet, Take 5 mg by mouth daily., Disp: , Rfl:  .  aspirin 81 MG tablet, Take 81 mg by mouth daily., Disp: , Rfl:  .  atorvastatin (LIPITOR) 20 MG tablet, Take 20 mg by mouth daily. , Disp: , Rfl:  .  Calcium Carbonate-Vitamin D (CALCIUM 600+D PO), Take 1 tablet by mouth 2 (two) times daily., Disp: , Rfl:  .  clopidogrel (PLAVIX) 75 MG tablet, Take 75 mg by mouth daily., Disp: , Rfl:  .  diphenhydrAMINE HCl, Sleep, ([facility] SLEEP AID PO), Take 2 capsules by mouth at bedtime as needed (sleep)., Disp: , Rfl:  .  DULoxetine (CYMBALTA) 30 MG capsule, Take 30 mg by mouth daily., Disp: , Rfl:  .  fenofibrate (TRICOR) 145 MG tablet, Take 145 mg by mouth daily., Disp: , Rfl:  .  fish oil-omega-3 fatty acids 1000 MG capsule, Take 1 g by mouth daily., Disp: , Rfl:  .  furosemide (LASIX) 20 MG tablet, Take 20 mg by mouth daily as needed for fluid. , Disp: , Rfl:  .  gabapentin (NEURONTIN) 100 MG capsule, Take 100 mg by mouth 3 (three) times daily., Disp: , Rfl:  .  glimepiride (AMARYL) 4 MG tablet, Take 4 mg by mouth daily with breakfast. , Disp: , Rfl:  .  losartan-hydrochlorothiazide (HYZAAR) 100-25 MG tablet, Take 1 tablet by mouth daily. , Disp: , Rfl:  .  magnesium oxide (MAG-OX) 400 MG tablet, Take 400 mg by mouth daily., Disp: , Rfl:  .  metFORMIN (GLUCOPHAGE) 1000 MG tablet, Take 1,000 mg  by mouth daily with breakfast. , Disp: , Rfl:  .  metoprolol succinate (TOPROL-XL) 100 MG 24 hr tablet, Take 100 mg by mouth daily. Take with or immediately following a meal., Disp: , Rfl:  .  TURMERIC PO, Take 1 capsule by mouth daily. , Disp: , Rfl:  .  vitamin B-12 (CYANOCOBALAMIN) 1000 MCG tablet, Take 1,000 mcg by mouth daily., Disp: , Rfl:  .  VITAMIN D PO, Take 1 capsule by mouth daily., Disp: , Rfl:  .  vitamin E 400 UNIT capsule, Take 400 Units by mouth daily. , Disp: , Rfl:  .  HYDROcodone-acetaminophen (NORCO/VICODIN) 5-325 MG tablet, 1 tab po bid prn (Patient not taking: Reported on 09/24/2019), Disp: 12 tablet, Rfl: 0  Allergies Tape  Family History  Problem Relation Age of Onset  . Colon cancer Cousin 44  . Lymphoma Brother 20  . Lung cancer Mother   . Hypertension Father   . Kidney failure Father   . Breast cancer Neg  Hx     Social History Social History   Tobacco Use  . Smoking status: Former Smoker    Years: 6.00    Types: Cigarettes    Quit date: 2008    Years since quitting: 12.9  . Smokeless tobacco: Never Used  Substance Use Topics  . Alcohol use: Yes    Comment: rare  . Drug use: No    Review of Systems Constitutional: Positive fever.  ENT: No sore throat. Cardiovascular: Denies chest pain. Respiratory: Denies shortness of breath. Gastrointestinal: No abdominal pain.  No nausea, no vomiting.  No diarrhea.  No constipation. Genitourinary: Negative for dysuria. Musculoskeletal: Negative for back pain. Skin: Negative for atypical rash. Neurological: Negative for headaches, focal weakness or numbness.    ____________________________________________   PHYSICAL EXAM:  VITAL SIGNS: ED Triage Vitals  Enc Vitals Group     BP 09/30/19 1645 117/64     Pulse Rate 09/30/19 1645 77     Resp 09/30/19 1645 18     Temp 09/30/19 1645 99.5 F (37.5 C)     Temp Source 09/30/19 1645 Oral     SpO2 09/30/19 1645 97 %     Weight 09/30/19 1641 170 lb  (77.1 kg)     Height 09/30/19 1641 4\' 11"  (1.499 m)     Head Circumference --      Peak Flow --      Pain Score 09/30/19 1641 0     Pain Loc --      Pain Edu? --      Excl. in [place]? --     Constitutional: Alert and oriented. Well appearing and in no acute distress. Eyes: Conjunctivae are normal.  ENT      Head: Normocephalic and atraumatic. Cardiovascular: Normal rate, regular rhythm. Grossly normal heart sounds.  Good peripheral circulation. Respiratory: Normal respiratory effort without tachypnea nor retractions. Breath sounds are clear and equal bilaterally. No wheezes, rales, rhonchi. Musculoskeletal: Steady gait.  Neurologic:  Normal speech and language. Speech is normal. No gait instability.  Skin:  Skin is warm, dry. Psychiatric: Mood and affect are normal. Speech and behavior are normal. Patient exhibits appropriate insight and judgment   ___________________________________________   LABS (all labs ordered are listed, but only abnormal results are displayed)  Labs Reviewed  NOVEL CORONAVIRUS, NAA (HOSP ORDER, SEND-OUT TO REF LAB; TAT 18-24 HRS)    PROCEDURES Procedures    INITIAL IMPRESSION / ASSESSMENT AND PLAN / ED COURSE  Pertinent labs & imaging results that were available during my care of the patient were reviewed by me and considered in my medical decision making (see chart for details).  Well-appearing patient.  No acute distress.  COVID-19 exposure with recent fever and congestion.  Denies other complaints or symptoms.  Well-appearing.  COVID-19 testing completed and advice given.  Discussed very close monitoring, supportive care.  Discussed follow up with Primary care physician this week as needed. Discussed follow up and return parameters including no resolution or any worsening concerns. Patient verbalized understanding and agreed to plan.   ____________________________________________   FINAL CLINICAL IMPRESSION(S) / ED DIAGNOSES  Final diagnoses:    Viral illness  Advice given about COVID-19 virus infection     ED Discharge Orders    None       Note: This dictation was prepared with Dragon dictation along with smaller phrase technology. Any transcriptional errors that result from this process are unintentional.      Marylene Land, NP 09/30/19 1756

## 2019-09-30 NOTE — ED Triage Notes (Signed)
Pt c/o fever (100.9). Started about 3 days ago. 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 Denies any other covid symptoms.

## 2019-10-01 ENCOUNTER — Telehealth (INDEPENDENT_AMBULATORY_CARE_PROVIDER_SITE_OTHER): Payer: Self-pay

## 2019-10-01 ENCOUNTER — Other Ambulatory Visit: Payer: Self-pay | Admitting: Internal Medicine

## 2019-10-01 DIAGNOSIS — R928 Other abnormal and inconclusive findings on diagnostic imaging of breast: Secondary | ICD-10-CM

## 2019-10-01 DIAGNOSIS — N6320 Unspecified lump in the left breast, unspecified quadrant: Secondary | ICD-10-CM

## 2019-10-01 LAB — NOVEL CORONAVIRUS, NAA (HOSP ORDER, SEND-OUT TO REF LAB; TAT 18-24 HRS): SARS-CoV-2, NAA: DETECTED — AB

## 2019-10-01 NOTE — Telephone Encounter (Signed)
Patient called to let us know she tested positive for covid and needed to postpone her angio procedure for now. Patient has be canceled off the scheduled for now.

## 2019-10-02 ENCOUNTER — Telehealth: Payer: Self-pay | Admitting: Nurse Practitioner

## 2019-10-02 ENCOUNTER — Telehealth (HOSPITAL_COMMUNITY): Payer: Self-pay | Admitting: Emergency Medicine

## 2019-10-02 ENCOUNTER — Other Ambulatory Visit: Admission: RE | Admit: 2019-10-02 | Payer: PPO | Source: Ambulatory Visit

## 2019-10-02 ENCOUNTER — Ambulatory Visit: Payer: PPO | Admitting: Physician Assistant

## 2019-10-02 NOTE — Telephone Encounter (Signed)

## 2019-10-02 NOTE — Telephone Encounter (Signed)
Called to Discuss with patient about Covid symptoms and the use of bamlanivimab, a monoclonal antibody infusion for those with mild to moderate Covid symptoms and at a high risk of hospitalization.     Pt is qualified for this infusion at the [facility] infusion center due to co-morbid conditions and/or a member of an at-risk group.    Patient Active Problem List   Diagnosis Date Noted  . CHF (congestive heart failure) ([place]) 09/24/2019  . Cellulitis 06/17/2019  . Dermatitis 03/23/2019  . Surgical site infection 11/19/2018  . Atherosclerosis of native arteries of extremity with intermittent claudication ([place]) 11/12/2018  . Preop cardiovascular exam 09/18/2018  . Atherosclerosis of native arteries of extremity with rest pain ([place]) 07/16/2018  . Obesity (BMI 30.0-34.9) 04/22/2018  . 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 Rotator cuff tendinitis, left 03/26/2018  . Spondylosis of cervical region without myelopathy or radiculopathy 03/26/2018  . PAD (peripheral artery disease) ([place]) 05/16/2017  . Diabetes ([place]) 05/16/2017  . Essential hypertension 05/16/2017  . Hyperlipidemia 05/16/2017  . Hx of cardiac cath 05/16/2015  . ASCVD (arteriosclerotic cardiovascular disease) 03/25/2014  . H/O CHF 03/25/2014  . H/O: hysterectomy 03/25/2014  . 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      Unable to reach pt

## 2019-10-05 ENCOUNTER — Other Ambulatory Visit (INDEPENDENT_AMBULATORY_CARE_PROVIDER_SITE_OTHER): Payer: Self-pay | Admitting: Nurse Practitioner

## 2019-10-06 ENCOUNTER — Encounter: Admission: RE | Payer: Self-pay | Source: Home / Self Care

## 2019-10-06 ENCOUNTER — Encounter: Payer: PPO | Admitting: Physician Assistant

## 2019-10-06 ENCOUNTER — Ambulatory Visit: Admission: RE | Admit: 2019-10-06 | Payer: PPO | Source: Home / Self Care | Admitting: Vascular Surgery

## 2019-10-06 DIAGNOSIS — L97909 Non-pressure chronic ulcer of unspecified part of unspecified lower leg with unspecified severity: Secondary | ICD-10-CM

## 2019-10-06 SURGERY — LOWER EXTREMITY ANGIOGRAPHY
Anesthesia: Moderate Sedation | Laterality: Left

## 2019-10-07 ENCOUNTER — Other Ambulatory Visit: Payer: Self-pay

## 2019-10-07 ENCOUNTER — Emergency Department: Payer: PPO

## 2019-10-07 ENCOUNTER — Inpatient Hospital Stay
Admission: EM | Admit: 2019-10-07 | Discharge: 2019-10-09 | DRG: 177 | Disposition: A | Discharge status: Other Acute Inpatient Hospital | Payer: PPO | Attending: Internal Medicine | Admitting: Internal Medicine

## 2019-10-07 ENCOUNTER — Encounter: Payer: Self-pay | Admitting: Emergency Medicine

## 2019-10-07 DIAGNOSIS — D649 Anemia, unspecified: Secondary | ICD-10-CM | POA: Diagnosis not present

## 2019-10-07 DIAGNOSIS — Z8249 Family history of ischemic heart disease and other diseases of the circulatory system: Secondary | ICD-10-CM | POA: Diagnosis not present

## 2019-10-07 DIAGNOSIS — J9601 Acute respiratory failure with hypoxia: Secondary | ICD-10-CM | POA: Diagnosis present

## 2019-10-07 DIAGNOSIS — R000 Tachycardia, unspecified: Secondary | ICD-10-CM | POA: Diagnosis not present

## 2019-10-07 DIAGNOSIS — J1282 Pneumonia due to coronavirus disease 2019: Secondary | ICD-10-CM

## 2019-10-07 DIAGNOSIS — Z7902 Long term (current) use of antithrombotics/antiplatelets: Secondary | ICD-10-CM

## 2019-10-07 DIAGNOSIS — Z85828 Personal history of other malignant neoplasm of skin: Secondary | ICD-10-CM

## 2019-10-07 DIAGNOSIS — I110 Hypertensive heart disease with heart failure: Secondary | ICD-10-CM | POA: Diagnosis present

## 2019-10-07 DIAGNOSIS — E785 Hyperlipidemia, unspecified: Secondary | ICD-10-CM | POA: Diagnosis present

## 2019-10-07 DIAGNOSIS — R7611 Nonspecific reaction to tuberculin skin test without active tuberculosis: Secondary | ICD-10-CM | POA: Diagnosis not present

## 2019-10-07 DIAGNOSIS — L97929 Non-pressure chronic ulcer of unspecified part of left lower leg with unspecified severity: Secondary | ICD-10-CM | POA: Diagnosis not present

## 2019-10-07 DIAGNOSIS — Z955 Presence of coronary angioplasty implant and graft: Secondary | ICD-10-CM

## 2019-10-07 DIAGNOSIS — Z79899 Other long term (current) drug therapy: Secondary | ICD-10-CM | POA: Diagnosis not present

## 2019-10-07 DIAGNOSIS — U071 COVID-19: Secondary | ICD-10-CM | POA: Diagnosis not present

## 2019-10-07 DIAGNOSIS — E1151 Type 2 diabetes mellitus with diabetic peripheral angiopathy without gangrene: Secondary | ICD-10-CM | POA: Diagnosis not present

## 2019-10-07 DIAGNOSIS — R918 Other nonspecific abnormal finding of lung field: Secondary | ICD-10-CM | POA: Diagnosis not present

## 2019-10-07 DIAGNOSIS — I2510 Atherosclerotic heart disease of native coronary artery without angina pectoris: Secondary | ICD-10-CM | POA: Diagnosis present

## 2019-10-07 DIAGNOSIS — Z7982 Long term (current) use of aspirin: Secondary | ICD-10-CM | POA: Diagnosis not present

## 2019-10-07 DIAGNOSIS — Z87891 Personal history of nicotine dependence: Secondary | ICD-10-CM

## 2019-10-07 DIAGNOSIS — J1289 Other viral pneumonia: Secondary | ICD-10-CM | POA: Diagnosis present

## 2019-10-07 DIAGNOSIS — E118 Type 2 diabetes mellitus with unspecified complications: Secondary | ICD-10-CM

## 2019-10-07 DIAGNOSIS — I509 Heart failure, unspecified: Secondary | ICD-10-CM | POA: Diagnosis present

## 2019-10-07 DIAGNOSIS — Z91048 Other nonmedicinal substance allergy status: Secondary | ICD-10-CM | POA: Diagnosis not present

## 2019-10-07 DIAGNOSIS — E119 Type 2 diabetes mellitus without complications: Secondary | ICD-10-CM

## 2019-10-07 DIAGNOSIS — I10 Essential (primary) hypertension: Secondary | ICD-10-CM | POA: Diagnosis present

## 2019-10-07 DIAGNOSIS — I739 Peripheral vascular disease, unspecified: Secondary | ICD-10-CM | POA: Diagnosis present

## 2019-10-07 DIAGNOSIS — R531 Weakness: Secondary | ICD-10-CM | POA: Diagnosis not present

## 2019-10-07 DIAGNOSIS — E11622 Type 2 diabetes mellitus with other skin ulcer: Secondary | ICD-10-CM | POA: Diagnosis present

## 2019-10-07 LAB — BASIC METABOLIC PANEL
Anion gap: 14 (ref 5–15)
BUN: 41 mg/dL — ABNORMAL HIGH (ref 8–23)
CO2: 27 mmol/L (ref 22–32)
Calcium: 8.6 mg/dL — ABNORMAL LOW (ref 8.9–10.3)
Chloride: 91 mmol/L — ABNORMAL LOW (ref 98–111)
Creatinine, Ser: 0.78 mg/dL (ref 0.44–1.00)
GFR calc Af Amer: >60 mL/min (ref >60)
GFR calc non Af Amer: >60 mL/min (ref >60)
Glucose, Bld: 93 mg/dL (ref 70–99)
Potassium: 3.3 mmol/L — ABNORMAL LOW (ref 3.5–5.1)
Sodium: 132 mmol/L — ABNORMAL LOW (ref 135–145)

## 2019-10-07 LAB — PROTIME-INR
INR: 1 (ref 0.8–1.2)
Prothrombin Time: 12.9 seconds (ref 11.4–15.2)

## 2019-10-07 LAB — CBC
HCT: 35.8 % — ABNORMAL LOW (ref 36.0–46.0)
Hemoglobin: 11.9 g/dL — ABNORMAL LOW (ref 12.0–15.0)
MCH: 27.7 pg (ref 26.0–34.0)
MCHC: 33.2 g/dL (ref 30.0–36.0)
MCV: 83.4 fL (ref 80.0–100.0)
Platelets: 317 10*3/uL (ref 150–400)
RBC: 4.29 MIL/uL (ref 3.87–5.11)
RDW: 13.8 % (ref 11.5–15.5)
WBC: 3.9 10*3/uL — ABNORMAL LOW (ref 4.0–10.5)
nRBC: 0 % (ref 0.0–0.2)

## 2019-10-07 LAB — TROPONIN I (HIGH SENSITIVITY)
Troponin I (High Sensitivity): 9 ng/L (ref <18)
Troponin I (High Sensitivity): 11 ng/L (ref <18)

## 2019-10-07 LAB — ABO/RH: ABO/RH(D): O NEG

## 2019-10-07 LAB — APTT: aPTT: 28 seconds (ref 24–36)

## 2019-10-07 LAB — GLUCOSE, CAPILLARY: Glucose-Capillary: 87 mg/dL (ref 70–99)

## 2019-10-07 MED ORDER — DEXAMETHASONE SODIUM PHOSPHATE 10 MG/ML IJ SOLN
10.0000 mg | Freq: Once | INTRAMUSCULAR | Status: AC
  Administered 2019-10-07: 10 mg via INTRAVENOUS
  Filled 2019-10-07: qty 1

## 2019-10-07 MED ORDER — ADULT MULTIVITAMIN W/MINERALS CH
1.0000 | ORAL_TABLET | Freq: Every day | ORAL | Status: DC
  Administered 2019-10-07 – 2019-10-09 (×3): 1 via ORAL
  Filled 2019-10-07 (×3): qty 1

## 2019-10-07 MED ORDER — METHYLPREDNISOLONE SODIUM SUCC 40 MG IJ SOLR
0.2500 mg/kg | Freq: Two times a day (BID) | INTRAMUSCULAR | Status: DC
  Administered 2019-10-07 – 2019-10-09 (×4): 19.2 mg via INTRAVENOUS
  Filled 2019-10-07 (×4): qty 1

## 2019-10-07 MED ORDER — SODIUM CHLORIDE 0.9 % IV SOLN
100.0000 mg | Freq: Every day | INTRAVENOUS | Status: DC
  Administered 2019-10-08: 100 mg via INTRAVENOUS
  Filled 2019-10-07: qty 100
  Filled 2019-10-07: qty 20

## 2019-10-07 MED ORDER — SODIUM CHLORIDE 0.9% FLUSH
3.0000 mL | Freq: Two times a day (BID) | INTRAVENOUS | Status: DC
  Administered 2019-10-07 – 2019-10-09 (×4): 3 mL via INTRAVENOUS

## 2019-10-07 MED ORDER — SODIUM CHLORIDE 0.9% FLUSH: 3.0000 mL | INTRAVENOUS | Status: DC | PRN

## 2019-10-07 MED ORDER — SODIUM CHLORIDE 0.9 % IV SOLN
250.0000 mL | INTRAVENOUS | Status: DC | PRN
  Administered 2019-10-07: 250 mL via INTRAVENOUS

## 2019-10-07 MED ORDER — SENNOSIDES-DOCUSATE SODIUM 8.6-50 MG PO TABS: 1.0000 | ORAL_TABLET | Freq: Every evening | ORAL | Status: DC | PRN

## 2019-10-07 MED ORDER — ACETAMINOPHEN 325 MG PO TABS
650.0000 mg | ORAL_TABLET | Freq: Four times a day (QID) | ORAL | Status: DC | PRN
  Administered 2019-10-07 – 2019-10-09 (×3): 650 mg via ORAL
  Filled 2019-10-07 (×3): qty 2

## 2019-10-07 MED ORDER — SODIUM CHLORIDE 0.9 % IV SOLN
200.0000 mg | Freq: Once | INTRAVENOUS | Status: AC
  Administered 2019-10-07: 200 mg via INTRAVENOUS
  Filled 2019-10-07: qty 200

## 2019-10-07 MED ORDER — INSULIN ASPART 100 UNIT/ML ~~LOC~~ SOLN
0.0000 [IU] | Freq: Three times a day (TID) | SUBCUTANEOUS | Status: DC
  Administered 2019-10-08: 7 [IU] via SUBCUTANEOUS
  Administered 2019-10-08: 11 [IU] via SUBCUTANEOUS
  Administered 2019-10-08: 15 [IU] via SUBCUTANEOUS
  Administered 2019-10-09: 7 [IU] via SUBCUTANEOUS
  Filled 2019-10-07 (×4): qty 1

## 2019-10-07 MED ORDER — GUAIFENESIN-DM 100-10 MG/5ML PO SYRP
10.0000 mL | ORAL_SOLUTION | ORAL | Status: DC | PRN
  Filled 2019-10-07: qty 10

## 2019-10-07 MED ORDER — ASCORBIC ACID 500 MG PO TABS
500.0000 mg | ORAL_TABLET | Freq: Every day | ORAL | Status: DC
  Administered 2019-10-07 – 2019-10-09 (×2): 500 mg via ORAL
  Filled 2019-10-07 (×4): qty 1

## 2019-10-07 MED ORDER — ALBUTEROL SULFATE HFA 108 (90 BASE) MCG/ACT IN AERS
2.0000 | INHALATION_SPRAY | Freq: Four times a day (QID) | RESPIRATORY_TRACT | Status: DC | PRN
  Administered 2019-10-08 – 2019-10-09 (×2): 2 via RESPIRATORY_TRACT
  Filled 2019-10-07: qty 6.7

## 2019-10-07 MED ORDER — ENOXAPARIN SODIUM 40 MG/0.4ML ~~LOC~~ SOLN
40.0000 mg | SUBCUTANEOUS | Status: DC
  Administered 2019-10-07 – 2019-10-08 (×2): 40 mg via SUBCUTANEOUS
  Filled 2019-10-07 (×2): qty 0.4

## 2019-10-07 MED ORDER — ZINC SULFATE 220 (50 ZN) MG PO CAPS
220.0000 mg | ORAL_CAPSULE | Freq: Every day | ORAL | Status: DC
  Administered 2019-10-07 – 2019-10-09 (×3): 220 mg via ORAL
  Filled 2019-10-07 (×3): qty 1

## 2019-10-07 NOTE — Consult Note (Signed)
Remdesivir - Pharmacy Brief Note   O:  ALT: N/A CXR: Extensive bilateral ground-glass opacities, consistent with multifocal viral pneumonia. SpO2: 67% on arrival, currently 94% on 6L   A/P:  Patient has positive COVID test on 10/02/19 but had only mild symptoms then. Returns to the ED today c/o SOB and weakness.  Remdesivir 200 mg IVPB once followed by 100 mg IVPB daily x 4 days.   Pearla Dubonnet, PharmD Clinical Pharmacist 10/07/2019 8:24 PM

## 2019-10-07 NOTE — ED Triage Notes (Signed)
Pt reports was diagnosed with COVID last week and is not feeling any better. Pt c/o SOB and weakness.

## 2019-10-07 NOTE — ED Provider Notes (Signed)
[facility] Emergency Department Provider Note   ____________________________________________   First MD Initiated Contact with Patient 10/07/19 1829     (approximate)  I have reviewed the triage vital signs and the nursing notes.   HISTORY  Chief Complaint Shortness of Breath and Weakness    HPI Christina Blackburn is a 72 y.o. female with past medical history of hypertension, diabetes, CHF, PAD who presents to the ED complaining of shortness of breath.  Patient reports that she was diagnosed with COVID-19 on 12/16, was initially only having mild symptoms but they have seemed to worsen over the past few days.  She describes increasing generalized weakness as well as difficulty catching her breath with a dry cough.  She has continued to have intermittent fevers, but denies any pain in her chest.  She has not noticed any pain or swelling in her lower extremities, does have a chronic wound to the back of her left lower leg, which she follows with the wound clinic for and is unchanged.  She has not been taking anything for her symptoms.        Past Medical History:  Diagnosis Date  . Anemia   . CHF (congestive heart failure) ([place])   . Chicken pox   . Colon polyps   . Complication of anesthesia   . Coronary artery disease   . Cystitis 1986  . Diabetes mellitus without complication ([place]) 99991111  . Diverticulosis   . Heart disease 2008  . Hyperlipidemia   . Hypertension 2003  . PAD (peripheral artery disease) ([place])   . PONV (postoperative nausea and vomiting)   . Rotator cuff tendinitis   . Squamous cell skin cancer 2016    Patient Active Problem List   Diagnosis Date Noted  . Acute respiratory failure with hypoxia ([place]) 10/07/2019  . Pneumonia due to COVID-19 virus 10/07/2019  . CHF (congestive heart failure) ([place]) 09/24/2019  . Cellulitis 06/17/2019  . Dermatitis 03/23/2019  . Surgical site infection 11/19/2018  . Atherosclerosis of native  arteries of extremity with intermittent claudication ([place]) 11/12/2018  . Preop cardiovascular exam 09/18/2018  . Atherosclerosis of native arteries of extremity with rest pain ([place]) 07/16/2018  . Obesity (BMI 30.0-34.9) 04/22/2018  . 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 Rotator cuff tendinitis, left 03/26/2018  . Spondylosis of cervical region without myelopathy or radiculopathy 03/26/2018  . PAD (peripheral artery disease) ([place]) 05/16/2017  . Non-insulin treated type 2 diabetes mellitus ([place]) 05/16/2017  . Essential hypertension 05/16/2017  . Hyperlipidemia 05/16/2017  . Hx of cardiac cath 05/16/2015  . ASCVD (arteriosclerotic cardiovascular disease) 03/25/2014  . H/O CHF 03/25/2014  . H/O: hysterectomy 03/25/2014  . 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 ABDOMINAL HYSTERECTOMY  1989  . APPLICATION OF WOUND VAC Bilateral 12/02/2018   Procedure: APPLICATION OF WOUND VAC;  Surgeon: Katha Cabal, MD;  Location: [facility] ORS;  Service: Vascular;  Laterality: Bilateral;  . BREAST BIOPSY Left 2014   neg  . CARDIAC CATHETERIZATION     cornary stent  . [place]  . CHOLECYSTECTOMY  1987  . COLONOSCOPY    . COLONOSCOPY WITH PROPOFOL N/A 09/22/2018   Procedure: COLONOSCOPY WITH PROPOFOL;  Surgeon: Lollie Sails, MD;  Location: [facility] ENDOSCOPY;  Service: Endoscopy;  Laterality: N/A;  . CORONARY ANGIOPLASTY  2008   stent x1  . ENDARTERECTOMY FEMORAL Bilateral 11/12/2018   Procedure:  ENDARTERECTOMY FEMORAL;  Surgeon: Katha Cabal, MD;  Location: [facility] ORS;  Service: Vascular;  Laterality: Bilateral;  . INSERTION OF ILIAC STENT Bilateral 11/12/2018   Procedure: INSERTION OF ILIAC STENT;  Surgeon: Katha Cabal, MD;  Location: [facility] ORS;  Service: Vascular;  Laterality: Bilateral;  . JOINT REPLACEMENT Right 04/12/2018   shoulder  . LOWER EXTREMITY ANGIOGRAPHY Left 08/05/2018    Procedure: LOWER EXTREMITY ANGIOGRAPHY;  Surgeon: Katha Cabal, MD;  Location: [facility];  Service: Cardiovascular;  Laterality: Left;  . REVERSE SHOULDER ARTHROPLASTY Right 04/08/2018   Procedure: REVERSE SHOULDER ARTHROPLASTY;  Surgeon: Corky Mull, MD;  Location: [facility] ORS;  Service: Orthopedics;  Laterality: Right;  . TUBAL LIGATION    . WOUND DEBRIDEMENT Bilateral 12/02/2018   Procedure: DEBRIDEMENT WOUND;  Surgeon: Katha Cabal, MD;  Location: [facility] ORS;  Service: Vascular;  Laterality: Bilateral;    Prior to Admission medications   Medication Sig Start Date End Date Taking? Authorizing Provider  amLODipine (NORVASC) 5 MG tablet Take 5 mg by mouth daily.   Yes [provider]  aspirin 81 MG tablet Take 81 mg by mouth daily.   Yes [provider]  atorvastatin (LIPITOR) 20 MG tablet Take 20 mg by mouth daily.  03/28/17  Yes [provider]  Calcium Carbonate-Vitamin D (CALCIUM 600+D PO) Take 1 tablet by mouth 2 (two) times daily.   Yes [provider]  clopidogrel (PLAVIX) 75 MG tablet Take 75 mg by mouth daily.   Yes [provider]  DULoxetine (CYMBALTA) 30 MG capsule Take 30 mg by mouth daily. 09/23/19  Yes [provider]  fenofibrate (TRICOR) 145 MG tablet Take 145 mg by mouth daily.   Yes [provider]  fish oil-omega-3 fatty acids 1000 MG capsule Take 1 g by mouth daily.   Yes [provider]  furosemide (LASIX) 20 MG tablet Take 20 mg by mouth daily as needed for fluid.    Yes [provider]  gabapentin (NEURONTIN) 100 MG capsule Take 100-200 mg by mouth 3 (three) times daily.  09/23/19  Yes [provider]  glimepiride (AMARYL) 4 MG tablet Take 4 mg by mouth daily with breakfast.    Yes [provider]  losartan-hydrochlorothiazide (HYZAAR) 100-25 MG tablet Take 1 tablet by mouth daily.  04/30/17  Yes [provider]  magnesium oxide (MAG-OX) 400 MG  tablet Take 400 mg by mouth daily.   Yes [provider]  metFORMIN (GLUCOPHAGE) 1000 MG tablet Take 1,000 mg by mouth daily with breakfast.    Yes [provider]  metoprolol succinate (TOPROL-XL) 100 MG 24 hr tablet Take 100 mg by mouth daily. Take with or immediately following a meal.   Yes [provider]  TURMERIC PO Take 1 capsule by mouth daily.    Yes [provider]  vitamin B-12 (CYANOCOBALAMIN) 1000 MCG tablet Take 1,000 mcg by mouth daily.   Yes [provider]  VITAMIN D PO Take 1 capsule by mouth daily.   Yes [provider]  vitamin E 400 UNIT capsule Take 400 Units by mouth daily.    Yes [provider]  acetaminophen (TYLENOL) 500 MG tablet Take 1,000 mg by mouth every 6 (six) hours as needed for moderate pain or headache.    [provider]  diphenhydrAMINE HCl, Sleep, ([facility] SLEEP AID PO) Take 2 capsules by mouth at bedtime as needed (sleep).    [provider]  HYDROcodone-acetaminophen (NORCO/VICODIN) 5-325  MG tablet 1 tab po bid prn Patient not taking: Reported on 09/24/2019 09/18/19   Norval Gable, MD    Allergies Tape  Family History  Problem Relation Age of Onset  . Colon cancer Cousin 57  . Lymphoma Brother 69  . Lung cancer Mother   . Hypertension Father   . Kidney failure Father   . Breast cancer Neg Hx     Social History Social History   Tobacco Use  . Smoking status: Former Smoker    Years: 6.00    Types: Cigarettes    Quit date: 2008    Years since quitting: 12.9  . Smokeless tobacco: Never Used  Substance Use Topics  . Alcohol use: Yes    Comment: rare  . Drug use: No    Review of Systems  Constitutional: Positive for fever/chills Eyes: No visual changes. ENT: No sore throat. Cardiovascular: Denies chest pain. Respiratory: Positive for cough and shortness of breath. Gastrointestinal: No abdominal pain.  No nausea, no vomiting.  No diarrhea.  No  constipation. Genitourinary: Negative for dysuria. Musculoskeletal: Negative for back pain. Skin: Negative for rash.  Positive for leg wound. Neurological: Negative for headaches, focal weakness or numbness.  ____________________________________________   PHYSICAL EXAM:  VITAL SIGNS: ED Triage Vitals  Enc Vitals Group     BP 10/07/19 1728 (!) 120/56     Pulse Rate 10/07/19 1728 (!) 104     Resp 10/07/19 1728 (!) 24     Temp 10/07/19 1728 99.8 F (37.7 C)     Temp Source 10/07/19 1728 Oral     SpO2 10/07/19 1728 (!) 67 %     Weight 10/07/19 1700 170 lb (77.1 kg)     Height 10/07/19 1700 4\' 11"  (1.499 m)     Head Circumference --      Peak Flow --      Pain Score 10/07/19 1700 0     Pain Loc --      Pain Edu? --      Excl. in [place]? --     Constitutional: Alert and oriented. Eyes: Conjunctivae are normal. Head: Atraumatic. Nose: No congestion/rhinnorhea. Mouth/Throat: Mucous membranes are moist. Neck: Normal ROM Cardiovascular: Normal rate, regular rhythm. Grossly normal heart sounds. Respiratory: Normal respiratory effort.  No retractions.  Crackles throughout. Gastrointestinal: Soft and nontender. No distention. Genitourinary: deferred Musculoskeletal: No lower extremity tenderness nor edema. Neurologic:  Normal speech and language. No gross focal neurologic deficits are appreciated. Skin:  Skin is warm, dry and intact. No rash noted.  Chronic wound to posterior left lower leg with iodine and dressing in place, no associated erythema, warmth, or tenderness. Psychiatric: Mood and affect are normal. Speech and behavior are normal.  ____________________________________________   LABS (all labs ordered are listed, but only abnormal results are displayed)  Labs Reviewed  BASIC METABOLIC PANEL - Abnormal; Notable for the following components:      Result Value   Sodium 132 (*)    Potassium 3.3 (*)    Chloride 91 (*)    BUN 41 (*)    Calcium 8.6 (*)    All other  components within normal limits  CBC - Abnormal; Notable for the following components:   WBC 3.9 (*)    Hemoglobin 11.9 (*)    HCT 35.8 (*)    All other components within normal limits  GLUCOSE, CAPILLARY  PROTIME-INR  APTT  URINALYSIS, COMPLETE (UACMP) WITH MICROSCOPIC  CBC WITH DIFFERENTIAL/PLATELET  COMPREHENSIVE METABOLIC PANEL  C-REACTIVE PROTEIN  HEMOGLOBIN A1C  FERRITIN  MAGNESIUM  PHOSPHORUS  FIBRIN DERIVATIVES D-DIMER ([facility] ONLY)  C-REACTIVE PROTEIN  CBG MONITORING, ED  ABO/RH  TROPONIN I (HIGH SENSITIVITY)  TROPONIN I (HIGH SENSITIVITY)   ____________________________________________  EKG  ED ECG REPORT I, Blake Divine, the attending physician, personally viewed and interpreted this ECG.   Date: 10/07/2019  EKG Time: 17:40  Rate: 102  Rhythm: sinus tachycardia  Axis: LAD  Intervals:none  ST&T Change: None  PROCEDURES  Procedure(s) performed (including Critical Care):  Procedures   ____________________________________________   INITIAL IMPRESSION / ASSESSMENT AND PLAN / ED COURSE       73 year old female, diagnosed with COVID-19 1 week prior, presents to the ED with increasing generalized weakness as well as shortness of breath.  She was noted to have O2 sat in the 70s on room air but is not in any obvious respiratory distress.  She was placed on 6 L nasal cannula with improvement of her sats to greater than 90%, continues to breathe comfortably.  EKG shows no acute ischemic changes and troponin is within normal limits.  Suspect her presentation is secondary to recent diagnosis of COVID-19, will check chest x-ray and anticipate admission.  Plan to treat with steroids given her hypoxia.  Remainder of labs are thus far unremarkable.  Chest x-ray consistent with COVID-19 pneumonia, patient remained stable on 6 L nasal cannula and no signs of respiratory distress currently.  Case was discussed with hospitalist, who accepts patient for admission.       ____________________________________________   FINAL CLINICAL IMPRESSION(S) / ED DIAGNOSES  Final diagnoses:  Pneumonia due to COVID-19 virus  Acute respiratory failure with hypoxia [facility])     ED Discharge Orders    None       Note:  This document was prepared using Dragon voice recognition software and may include unintentional dictation errors.   Blake Divine, MD 10/07/19 [phone]

## 2019-10-07 NOTE — H&P (Signed)
History and Physical    Christina Blackburn [id number] DOB: [birth year] DOA: 10/07/2019   PCP: Idelle Crouch, MD   Patient coming from:  home  Chief Complaint: Shortness of breath  HPI: Christina Blackburn is a 73 y.o. female with medical history significant for coronary artery disease from a peripheral arterial disease status post aortobiiliac stents, non-insulin-dependent type 2 diabetes and hypertension, who was diagnosed with COVID-19 on 09/29/2021 presents to the emergency room with a complaint of increasing shortness of breath and weakness.  She denies fever chills and admits to minimal nonproductive cough.  She denies chest pain, nausea, vomiting, diarrhea or abdominal pain.   On arrival in the emergency room, she had an O2 sat of 67% on room air improving to 94% on 6 L.  She was tachycardic at 104 and tachypneic at 24, blood pressure was 120/56 and she had a low-grade temperature of 99.8.  Chest x-ray showed extensive bilateral groundglass opacities and multifocal viral pneumonia.  Basic blood work of CBC and BMP showed mild leukopenia, mild anemia and minor electrolyte disturbance.  Troponin negative x2.  CRP pending.  Patient was started on IV dexamethasone and admission requested.     Review of Systems: As mentioned in HPI.  10 point review of systems otherwise negative  General: no fevers, chills, but complains of weakness HEENT: no blurry vision, hearing changes or sore throat Respiratory: worsening dyspnea,  minimalcoughing, no wheezing CV: no chest pain, no palpitations GI: no nausea, vomiting, abdominal pain, diarrhea, constipation GU: no dysuria, burning on urination, increased urinary frequency, hematuria  Ext: no leg edema Neuro: no unilateral weakness, numbness, or tingling, no vision change or hearing loss Skin: no rash, no skin tear., ulcers Musculoskeletal: No muscle spasm, no deformity, no limitation in range of movement Hematologic: No easy bruising.,  enlarged lymph nodes    Allergy:  Allergies  Allergen Reactions  . Tape Other (See Comments)    Hypoallergenic tape - blisters Blisters    Past Medical History:  Diagnosis Date  . Anemia   . CHF (congestive heart failure) ([place])   . Chicken pox   . Colon polyps   . Complication of anesthesia   . Coronary artery disease   . Cystitis 1986  . Diabetes mellitus without complication ([place]) 99991111  . Diverticulosis   . Heart disease 2008  . Hyperlipidemia   . Hypertension 2003  . PAD (peripheral artery disease) ([place])   . PONV (postoperative nausea and vomiting)   . Rotator cuff tendinitis   . Squamous cell skin cancer 2016    Past Surgical History:  Procedure Laterality Date  . ABDOMINAL HYSTERECTOMY  1989  . APPLICATION OF WOUND VAC Bilateral 12/02/2018   Procedure: APPLICATION OF WOUND VAC;  Surgeon: Katha Cabal, MD;  Location: [facility] ORS;  Service: Vascular;  Laterality: Bilateral;  . BREAST BIOPSY Left 2014   neg  . CARDIAC CATHETERIZATION     cornary stent  . [place]  . CHOLECYSTECTOMY  1987  . COLONOSCOPY    . COLONOSCOPY WITH PROPOFOL N/A 09/22/2018   Procedure: COLONOSCOPY WITH PROPOFOL;  Surgeon: Lollie Sails, MD;  Location: [facility]COPY;  Service: Endoscopy;  Laterality: N/A;  . CORONARY ANGIOPLASTY  2008   stent x1  . ENDARTERECTOMY FEMORAL Bilateral 11/12/2018   Procedure: ENDARTERECTOMY FEMORAL;  Surgeon: Katha Cabal, MD;  Location: [facility] ORS;  Service: Vascular;  Laterality: Bilateral;  . INSERTION OF ILIAC STENT Bilateral 11/12/2018   Procedure:  INSERTION OF ILIAC STENT;  Surgeon: Katha Cabal, MD;  Location: [facility] ORS;  Service: Vascular;  Laterality: Bilateral;  . JOINT REPLACEMENT Right 04/12/2018   shoulder  . LOWER EXTREMITY ANGIOGRAPHY Left 08/05/2018   Procedure: LOWER EXTREMITY ANGIOGRAPHY;  Surgeon: Katha Cabal, MD;  Location: [facility];  Service: Cardiovascular;  Laterality: Left;  .  REVERSE SHOULDER ARTHROPLASTY Right 04/08/2018   Procedure: REVERSE SHOULDER ARTHROPLASTY;  Surgeon: Corky Mull, MD;  Location: [facility] ORS;  Service: Orthopedics;  Laterality: Right;  . TUBAL LIGATION    . WOUND DEBRIDEMENT Bilateral 12/02/2018   Procedure: DEBRIDEMENT WOUND;  Surgeon: Katha Cabal, MD;  Location: [facility] ORS;  Service: Vascular;  Laterality: Bilateral;    Social History:  reports that she quit smoking about 12 years ago. Her smoking use included cigarettes. She quit after 6.00 years of use. She has never used smokeless tobacco. She reports current alcohol use. She reports that she does not use drugs.  Family History:  Family History  Problem Relation Age of Onset  . Colon cancer Cousin 51  . Lymphoma Brother 52  . Lung cancer Mother   . Hypertension Father   . Kidney failure Father   . Breast cancer Neg Hx      Prior to Admission medications   Medication Sig Start Date End Date Taking? Authorizing Provider  amLODipine (NORVASC) 5 MG tablet Take 5 mg by mouth daily.   Yes [provider]  aspirin 81 MG tablet Take 81 mg by mouth daily.   Yes [provider]  atorvastatin (LIPITOR) 20 MG tablet Take 20 mg by mouth daily.  03/28/17  Yes [provider]  Calcium Carbonate-Vitamin D (CALCIUM 600+D PO) Take 1 tablet by mouth 2 (two) times daily.   Yes [provider]  clopidogrel (PLAVIX) 75 MG tablet Take 75 mg by mouth daily.   Yes [provider]  DULoxetine (CYMBALTA) 30 MG capsule Take 30 mg by mouth daily. 09/23/19  Yes [provider]  fenofibrate (TRICOR) 145 MG tablet Take 145 mg by mouth daily.   Yes [provider]  fish oil-omega-3 fatty acids 1000 MG capsule Take 1 g by mouth daily.   Yes [provider]  furosemide (LASIX) 20 MG tablet Take 20 mg by mouth daily as needed for fluid.    Yes [provider]  gabapentin (NEURONTIN) 100 MG capsule Take 100-200 mg by mouth 3 (three)  times daily.  09/23/19  Yes [provider]  glimepiride (AMARYL) 4 MG tablet Take 4 mg by mouth daily with breakfast.    Yes [provider]  losartan-hydrochlorothiazide (HYZAAR) 100-25 MG tablet Take 1 tablet by mouth daily.  04/30/17  Yes [provider]  magnesium oxide (MAG-OX) 400 MG tablet Take 400 mg by mouth daily.   Yes [provider]  metFORMIN (GLUCOPHAGE) 1000 MG tablet Take 1,000 mg by mouth daily with breakfast.    Yes [provider]  metoprolol succinate (TOPROL-XL) 100 MG 24 hr tablet Take 100 mg by mouth daily. Take with or immediately following a meal.   Yes [provider]  TURMERIC PO Take 1 capsule by mouth daily.    Yes [provider]  vitamin B-12 (CYANOCOBALAMIN) 1000 MCG tablet Take 1,000 mcg by mouth daily.   Yes [provider]  VITAMIN D PO Take 1 capsule by mouth daily.   Yes [provider]  vitamin E 400 UNIT capsule Take 400 Units by  mouth daily.    Yes [provider]  acetaminophen (TYLENOL) 500 MG tablet Take 1,000 mg by mouth every 6 (six) hours as needed for moderate pain or headache.    [provider]  diphenhydrAMINE HCl, Sleep, ([facility] SLEEP AID PO) Take 2 capsules by mouth at bedtime as needed (sleep).    [provider]  HYDROcodone-acetaminophen (NORCO/VICODIN) 5-325 MG tablet 1 tab po bid prn Patient not taking: Reported on 09/24/2019 09/18/19   Norval Gable, MD    Physical Exam: Vitals:   10/07/19 1930 10/07/19 2000 10/07/19 2030 10/07/19 2100  BP: 140/75 124/74 114/72 140/72  Pulse: 86 81 85 94  Resp: (!) 24 (!) 25 (!) 25 (!) 27  Temp:      TempSrc:      SpO2: 98% 98% 99% 98%  Weight:      Height:       General: Not in acute distress, alert and oriented x 3 HEENT:       Eyes: PERRL, EOMI, no scleral icterus.       ENT: No discharge from the ears and nose, no pharynx injection, no tonsillar enlargement.  Neck: No JVD, no bruit, no  mass felt.no thyromegaly, adenopathy Heme: No lymph node enlargement. Cardiac: S1/S2, RRR, No murmurs, No gallops or rubs. Respiratory: Good air movement bilaterally. No rales, wheezing, rhonchi or rubs. GI: Soft, non distended, non tender, no organomegaly, BS present. GU: No hematuria Ext: No pitting leg edema bilaterally. 2+DP/PT pulse bilaterally. Musculoskeletal: No joint deformities, No joint redness or warmth, no limitation of ROM Skin: No rashes, ulcers, induration.  Neuro: Alert, oriented X3, cranial nerves II-XII grossly intact, moves all extremities normally. Muscle strength 5/5 in all extremities, sensation to light touch intact. Brachial reflex 2+ bilaterally. Knee reflex 1+ bilaterally. Negative Babinski's sign. Normal finger to nose test. Psych: good judgement and insight. No suicidal ideation  Labs on Admission: I have personally reviewed following labs and imaging studies  CBC: Recent Labs  Lab 10/07/19 1734  WBC 3.9*  HGB 11.9*  HCT 35.8*  MCV 83.4  PLT [id number]   Basic Metabolic Panel: Recent Labs  Lab 10/07/19 1734  NA 132*  K 3.3*  CL 91*  CO2 27  GLUCOSE 93  BUN 41*  CREATININE 0.78  CALCIUM 8.6*   GFR: Estimated Creatinine Clearance: 57.8 mL/min (by C-G formula based on SCr of 0.78 mg/dL). Liver Function Tests: No results for input(s): AST, ALT, ALKPHOS, BILITOT, PROT, ALBUMIN in the last 168 hours. No results for input(s): LIPASE, AMYLASE in the last 168 hours. No results for input(s): AMMONIA in the last 168 hours. Coagulation Profile: No results for input(s): INR, PROTIME in the last 168 hours. Cardiac Enzymes: No results for input(s): CKTOTAL, CKMB, CKMBINDEX, TROPONINI in the last 168 hours. BNP (last 3 results) No results for input(s): PROBNP in the last 8760 hours. HbA1C: No results for input(s): HGBA1C in the last 72 hours. CBG: Recent Labs  Lab 10/07/19 1749  GLUCAP 87   Lipid Profile: No results for input(s): CHOL, HDL, LDLCALC,  TRIG, CHOLHDL, LDLDIRECT in the last 72 hours. Thyroid Function Tests: No results for input(s): TSH, T4TOTAL, FREET4, T3FREE, THYROIDAB in the last 72 hours. Anemia Panel: No results for input(s): VITAMINB12, FOLATE, FERRITIN, TIBC, IRON, RETICCTPCT in the last 72 hours. Urine analysis:    Component Value Date/Time   COLORURINE STRAW (A) 04/04/2018 1130   APPEARANCEUR CLEAR (A) 04/04/2018 1130   LABSPEC 1.008 04/04/2018 1130   PHURINE 8.0 04/04/2018  [place] 04/04/2018 [place] 04/04/2018 [place] 04/04/2018 [place] 04/04/2018 1130   PROTEINUR NEGATIVE 04/04/2018 1130   NITRITE NEGATIVE 04/04/2018 1130   LEUKOCYTESUR NEGATIVE 04/04/2018 1130   Sepsis Labs: @LABRCNTIP (procalcitonin:4,lacticidven:4) ) Recent Results (from the past 240 hour(s))  Novel Coronavirus, NAA (Hosp order, Send-out to Ref Lab; TAT 18-24 hrs     Status: Abnormal   Collection Time: 09/30/19  4:46 PM   Specimen: Nasopharyngeal Swab; Respiratory  Result Value Ref Range Status   SARS-CoV-2, NAA DETECTED (A) NOT DETECTED Final    Comment: (NOTE)                  Client Requested Flag This nucleic acid amplification test was developed and its performance characteristics determined by [employer]. Nucleic acid amplification tests include PCR and TMA. This test has not been FDA cleared or approved. This test has been authorized by FDA under an Emergency Use Authorization (EUA). This test is only authorized for the duration of time the declaration that circumstances exist justifying the authorization of the emergency use of in vitro diagnostic tests for detection of SARS-CoV-2 virus and/or diagnosis of COVID-19 infection under section 564(b)(1) of the Act, 21 U.S.C. [id number]) (1), unless the authorization is terminated or revoked sooner. When diagnostic testing is negative, the possibility of a false negative result should be considered  in the context of a patient's recent exposures and the presence of clinical signs and symptoms consistent with COVID-19. An individual without symptoms of COVID-  19 and who is not shedding SARS-CoV-2 virus would expect to have a negative (not detected) result in this assay. Performed At: [facility] [address] [place], [place] [id number] Katina Degree MDPhD [id number]    Coronavirus Source NASOPHARYNGEAL  Final    Comment: Performed at [facility], [address], [place], [place] [address]     Radiological Exams on Admission: DG Chest Portable 1 View  Result Date: 10/07/2019 CLINICAL DATA:  COVID positive EXAM: PORTABLE CHEST 1 VIEW COMPARISON:  February 09, 2016 FINDINGS: There is mild cardiomegaly. Extensive patchy/of the ground-glass opacities are seen throughout both lungs minimally within the mid to lower lungs. No pleural effusion is seen. No acute osseous abnormality. The patient is status post right total shoulder arthroplasty. IMPRESSION: Extensive bilateral ground-glass opacities, consistent with multifocal viral pneumonia. Electronically Signed   By: Prudencio Pair M.D.   On: 10/07/2019 19:01     EKG: Independently reviewed.    Assessment/Plan    Acute respiratory failure with hypoxia (HCC) secondary to COVID-19 pneumonia: --Patient was diagnosed Covid positive on 09/30/2019, presented because of progressively worsening dyspnea and weakness --O2 sats on room air was 67%.  Currently saturating at 94% on 6 L --IV remdesivir, IV steroids, vitamins per protocol --Continue O2 to keep sats over 90% --CRP pending, if greater than 7, then consider Tocilizumab  Electrolyte disturbance --minor abnormalities on admission but will continue to monitor    PAD (peripheral artery disease) ([place]) --History of   post endarterectomy aorto/biiliac stents --No acute related issues --Continue antiplatelets and statins    Non-insulin treated type 2 diabetes mellitus  (HCC) --Hold oral hypoglycemic agents --Insulin coverage    Essential hypertension --Pressure is currently controlled --Continue home meds once med rec is completed  CAD --History of a bare-metal stent in 2008 --Patient complains of no chest pain --Troponin x2 within normal level --Continue home meds  DVT prophylaxis: Lovenox Code Status: Full code Disposition Plan:  Anticipate discharge back to previous home environment Consults called: none Admission status: inpatient       Date of Service 10/07/2019    [facility]   If 7PM-7AM, please contact night-coverage [email or web address] Password [facility] 10/07/2019, 9:23 PM

## 2019-10-08 DIAGNOSIS — J1289 Other viral pneumonia: Secondary | ICD-10-CM

## 2019-10-08 DIAGNOSIS — U071 COVID-19: Principal | ICD-10-CM

## 2019-10-08 DIAGNOSIS — I739 Peripheral vascular disease, unspecified: Secondary | ICD-10-CM

## 2019-10-08 DIAGNOSIS — J9601 Acute respiratory failure with hypoxia: Secondary | ICD-10-CM | POA: Diagnosis present

## 2019-10-08 DIAGNOSIS — I10 Essential (primary) hypertension: Secondary | ICD-10-CM

## 2019-10-08 LAB — CBC WITH DIFFERENTIAL/PLATELET
Abs Immature Granulocytes: 0.04 10*3/uL (ref 0.00–0.07)
Basophils Absolute: 0 10*3/uL (ref 0.0–0.1)
Basophils Relative: 0 %
Eosinophils Absolute: 0 10*3/uL (ref 0.0–0.5)
Eosinophils Relative: 0 %
HCT: 33.3 % — ABNORMAL LOW (ref 36.0–46.0)
Hemoglobin: 11.6 g/dL — ABNORMAL LOW (ref 12.0–15.0)
Immature Granulocytes: 1 %
Lymphocytes Relative: 16 %
Lymphs Abs: 0.5 10*3/uL — ABNORMAL LOW (ref 0.7–4.0)
MCH: 28 pg (ref 26.0–34.0)
MCHC: 34.8 g/dL (ref 30.0–36.0)
MCV: 80.4 fL (ref 80.0–100.0)
Monocytes Absolute: 0.1 10*3/uL (ref 0.1–1.0)
Monocytes Relative: 4 %
Neutro Abs: 2.3 10*3/uL (ref 1.7–7.7)
Neutrophils Relative %: 79 %
Platelets: 265 10*3/uL (ref 150–400)
RBC: 4.14 MIL/uL (ref 3.87–5.11)
RDW: 13.5 % (ref 11.5–15.5)
WBC: 2.9 10*3/uL — ABNORMAL LOW (ref 4.0–10.5)
nRBC: 0 % (ref 0.0–0.2)

## 2019-10-08 LAB — URINALYSIS, COMPLETE (UACMP) WITH MICROSCOPIC
Bacteria, UA: NONE SEEN
Bilirubin Urine: NEGATIVE
Glucose, UA: 50 mg/dL — AB
Hgb urine dipstick: NEGATIVE
Ketones, ur: 5 mg/dL — AB
Leukocytes,Ua: NEGATIVE
Nitrite: POSITIVE — AB
Protein, ur: 30 mg/dL — AB
Specific Gravity, Urine: 1.023 (ref 1.005–1.030)
pH: 7 (ref 5.0–8.0)

## 2019-10-08 LAB — MAGNESIUM: Magnesium: 1.7 mg/dL (ref 1.7–2.4)

## 2019-10-08 LAB — GLUCOSE, CAPILLARY
Glucose-Capillary: 232 mg/dL — ABNORMAL HIGH (ref 70–99)
Glucose-Capillary: 219 mg/dL — ABNORMAL HIGH (ref 70–99)
Glucose-Capillary: 298 mg/dL — ABNORMAL HIGH (ref 70–99)
Glucose-Capillary: 333 mg/dL — ABNORMAL HIGH (ref 70–99)
Glucose-Capillary: 219 mg/dL — ABNORMAL HIGH (ref 70–99)

## 2019-10-08 LAB — COMPREHENSIVE METABOLIC PANEL
ALT: 14 U/L (ref 0–44)
AST: 32 U/L (ref 15–41)
Albumin: 2.7 g/dL — ABNORMAL LOW (ref 3.5–5.0)
Alkaline Phosphatase: 37 U/L — ABNORMAL LOW (ref 38–126)
Anion gap: 17 — ABNORMAL HIGH (ref 5–15)
BUN: 34 mg/dL — ABNORMAL HIGH (ref 8–23)
CO2: 23 mmol/L (ref 22–32)
Calcium: 8.7 mg/dL — ABNORMAL LOW (ref 8.9–10.3)
Chloride: 94 mmol/L — ABNORMAL LOW (ref 98–111)
Creatinine, Ser: 0.62 mg/dL (ref 0.44–1.00)
GFR calc Af Amer: >60 mL/min (ref >60)
GFR calc non Af Amer: >60 mL/min (ref >60)
Glucose, Bld: 268 mg/dL — ABNORMAL HIGH (ref 70–99)
Potassium: 3.9 mmol/L (ref 3.5–5.1)
Sodium: 134 mmol/L — ABNORMAL LOW (ref 135–145)
Total Bilirubin: 0.9 mg/dL (ref 0.3–1.2)
Total Protein: 6.6 g/dL (ref 6.5–8.1)

## 2019-10-08 LAB — C-REACTIVE PROTEIN
CRP: 8.9 mg/dL — ABNORMAL HIGH (ref <1.0)
CRP: 9.4 mg/dL — ABNORMAL HIGH (ref <1.0)

## 2019-10-08 LAB — FIBRIN DERIVATIVES D-DIMER (ARMC ONLY): Fibrin derivatives D-dimer (ARMC): 1765.7 ng/mL (FEU) — ABNORMAL HIGH (ref 0.00–499.00)

## 2019-10-08 LAB — PHOSPHORUS: Phosphorus: 3.7 mg/dL (ref 2.5–4.6)

## 2019-10-08 LAB — HEMOGLOBIN A1C
Hgb A1c MFr Bld: 5.9 % — ABNORMAL HIGH (ref 4.8–5.6)
Mean Plasma Glucose: 122.63 mg/dL

## 2019-10-08 LAB — FERRITIN: Ferritin: 225 ng/mL (ref 11–307)

## 2019-10-08 MED ORDER — ENSURE ENLIVE PO LIQD
237.0000 mL | Freq: Three times a day (TID) | ORAL | Status: DC
  Administered 2019-10-08: 237 mL via ORAL

## 2019-10-08 MED ORDER — ASCORBIC ACID 500 MG PO TABS: 500.0000 mg | ORAL_TABLET | Freq: Every day | ORAL | Status: DC

## 2019-10-08 MED ORDER — DULOXETINE HCL 30 MG PO CPEP
30.0000 mg | ORAL_CAPSULE | Freq: Every day | ORAL | Status: DC
  Administered 2019-10-08 – 2019-10-09 (×2): 30 mg via ORAL
  Filled 2019-10-08 (×2): qty 1

## 2019-10-08 MED ORDER — INSULIN ASPART 100 UNIT/ML ~~LOC~~ SOLN: 0.0000 [IU] | Freq: Three times a day (TID) | SUBCUTANEOUS | 11 refills | Status: DC

## 2019-10-08 MED ORDER — VITAMIN D3 25 MCG (1000 UNIT) PO TABS: 1000.0000 [IU] | ORAL_TABLET | Freq: Every day | ORAL | Status: AC

## 2019-10-08 MED ORDER — AMLODIPINE BESYLATE 5 MG PO TABS
5.0000 mg | ORAL_TABLET | Freq: Every day | ORAL | Status: DC
  Administered 2019-10-08 – 2019-10-09 (×2): 5 mg via ORAL
  Filled 2019-10-08 (×2): qty 1

## 2019-10-08 MED ORDER — ACETAMINOPHEN 325 MG PO TABS: 650.0000 mg | ORAL_TABLET | Freq: Four times a day (QID) | ORAL | Status: AC | PRN

## 2019-10-08 MED ORDER — CLOPIDOGREL BISULFATE 75 MG PO TABS
75.0000 mg | ORAL_TABLET | Freq: Every day | ORAL | Status: DC
  Administered 2019-10-08 – 2019-10-09 (×2): 75 mg via ORAL
  Filled 2019-10-08 (×2): qty 1

## 2019-10-08 MED ORDER — ALBUTEROL SULFATE HFA 108 (90 BASE) MCG/ACT IN AERS: 2.0000 | INHALATION_SPRAY | Freq: Four times a day (QID) | RESPIRATORY_TRACT | Status: DC | PRN

## 2019-10-08 MED ORDER — ATORVASTATIN CALCIUM 20 MG PO TABS
20.0000 mg | ORAL_TABLET | Freq: Every day | ORAL | Status: DC
  Administered 2019-10-08 – 2019-10-09 (×2): 20 mg via ORAL
  Filled 2019-10-08 (×2): qty 1

## 2019-10-08 MED ORDER — VITAMIN E 180 MG (400 UNIT) PO CAPS
400.0000 [IU] | ORAL_CAPSULE | Freq: Every day | ORAL | Status: DC
  Administered 2019-10-09: 400 [IU] via ORAL
  Filled 2019-10-08 (×2): qty 1

## 2019-10-08 MED ORDER — METOPROLOL SUCCINATE ER 100 MG PO TB24: 100.0000 mg | ORAL_TABLET | Freq: Every day | ORAL | Status: DC

## 2019-10-08 MED ORDER — ENSURE ENLIVE PO LIQD: 237.0000 mL | Freq: Three times a day (TID) | ORAL | 12 refills | Status: DC

## 2019-10-08 MED ORDER — HYDROCHLOROTHIAZIDE 25 MG PO TABS
25.0000 mg | ORAL_TABLET | Freq: Every day | ORAL | Status: DC
  Administered 2019-10-08 – 2019-10-09 (×2): 25 mg via ORAL
  Filled 2019-10-08 (×2): qty 1

## 2019-10-08 MED ORDER — ENOXAPARIN SODIUM 40 MG/0.4ML ~~LOC~~ SOLN: 40.0000 mg | SUBCUTANEOUS | Status: DC

## 2019-10-08 MED ORDER — ASPIRIN 81 MG PO TBEC: 81.0000 mg | DELAYED_RELEASE_TABLET | Freq: Every day | ORAL | Status: DC

## 2019-10-08 MED ORDER — ASPIRIN EC 81 MG PO TBEC
81.0000 mg | DELAYED_RELEASE_TABLET | Freq: Every day | ORAL | Status: DC
  Administered 2019-10-08 – 2019-10-09 (×2): 81 mg via ORAL
  Filled 2019-10-08 (×2): qty 1

## 2019-10-08 MED ORDER — FUROSEMIDE 20 MG PO TABS
20.0000 mg | ORAL_TABLET | Freq: Every day | ORAL | Status: DC | PRN
  Filled 2019-10-08: qty 1

## 2019-10-08 MED ORDER — FENOFIBRATE 160 MG PO TABS: 160.0000 mg | ORAL_TABLET | Freq: Every day | ORAL | Status: DC

## 2019-10-08 MED ORDER — ZINC SULFATE 220 (50 ZN) MG PO CAPS: 220.0000 mg | ORAL_CAPSULE | Freq: Every day | ORAL | Status: AC

## 2019-10-08 MED ORDER — LOSARTAN POTASSIUM-HCTZ 100-25 MG PO TABS: 1.0000 | ORAL_TABLET | Freq: Every day | ORAL | Status: DC

## 2019-10-08 MED ORDER — GUAIFENESIN-DM 100-10 MG/5ML PO SYRP: 10.0000 mL | ORAL_SOLUTION | ORAL | 0 refills | Status: DC | PRN

## 2019-10-08 MED ORDER — VITAMIN D3 25 MCG (1000 UNIT) PO TABS
1000.0000 [IU] | ORAL_TABLET | Freq: Every day | ORAL | Status: DC
  Administered 2019-10-08 – 2019-10-09 (×2): 1000 [IU] via ORAL
  Filled 2019-10-08 (×4): qty 1

## 2019-10-08 MED ORDER — MAGNESIUM OXIDE 400 (241.3 MG) MG PO TABS
400.0000 mg | ORAL_TABLET | Freq: Every day | ORAL | Status: DC
  Administered 2019-10-08 – 2019-10-09 (×2): 400 mg via ORAL
  Filled 2019-10-08 (×2): qty 1

## 2019-10-08 MED ORDER — FENOFIBRATE 160 MG PO TABS
160.0000 mg | ORAL_TABLET | Freq: Every day | ORAL | Status: DC
  Administered 2019-10-09: 160 mg via ORAL
  Filled 2019-10-08 (×2): qty 1

## 2019-10-08 MED ORDER — METHYLPREDNISOLONE SODIUM SUCC 40 MG IJ SOLR: 0.2500 mg/kg | Freq: Two times a day (BID) | INTRAMUSCULAR | 0 refills | Status: DC

## 2019-10-08 MED ORDER — DULOXETINE HCL 30 MG PO CPEP: 30.0000 mg | ORAL_CAPSULE | Freq: Every day | ORAL | 3 refills | Status: DC

## 2019-10-08 MED ORDER — VITAMIN B-12 1000 MCG PO TABS
1000.0000 ug | ORAL_TABLET | Freq: Every day | ORAL | Status: DC
  Administered 2019-10-08 – 2019-10-09 (×2): 1000 ug via ORAL
  Filled 2019-10-08 (×2): qty 1

## 2019-10-08 MED ORDER — TRAMADOL HCL 50 MG PO TABS: 50.0000 mg | ORAL_TABLET | Freq: Four times a day (QID) | ORAL | Status: DC | PRN

## 2019-10-08 MED ORDER — SENNOSIDES-DOCUSATE SODIUM 8.6-50 MG PO TABS: 1.0000 | ORAL_TABLET | Freq: Every evening | ORAL | Status: AC | PRN

## 2019-10-08 MED ORDER — LOSARTAN POTASSIUM 50 MG PO TABS
100.0000 mg | ORAL_TABLET | Freq: Every day | ORAL | Status: DC
  Administered 2019-10-08 – 2019-10-09 (×2): 100 mg via ORAL
  Filled 2019-10-08 (×2): qty 2

## 2019-10-08 MED ORDER — GABAPENTIN 100 MG PO CAPS
200.0000 mg | ORAL_CAPSULE | Freq: Three times a day (TID) | ORAL | Status: DC
  Administered 2019-10-08 – 2019-10-09 (×3): 200 mg via ORAL
  Filled 2019-10-08 (×3): qty 2

## 2019-10-08 MED ORDER — GABAPENTIN 100 MG PO CAPS: 200.0000 mg | ORAL_CAPSULE | Freq: Three times a day (TID) | ORAL | Status: DC

## 2019-10-08 MED ORDER — CLOPIDOGREL BISULFATE 75 MG PO TABS: 75.0000 mg | ORAL_TABLET | Freq: Every day | ORAL | Status: DC

## 2019-10-08 MED ORDER — ADULT MULTIVITAMIN W/MINERALS CH: 1.0000 | ORAL_TABLET | Freq: Every day | ORAL | Status: AC

## 2019-10-08 MED ORDER — METOPROLOL SUCCINATE ER 100 MG PO TB24
100.0000 mg | ORAL_TABLET | Freq: Every day | ORAL | Status: DC
  Administered 2019-10-08: 100 mg via ORAL
  Filled 2019-10-08 (×2): qty 1

## 2019-10-08 NOTE — Progress Notes (Signed)
Initial Nutrition Assessment  DOCUMENTATION CODES:   Obesity unspecified  INTERVENTION:   Ensure Enlive po TID, each supplement provides 350 kcal and 20 grams of protein  MVI daily   Consider liberalizing diet if oral intake is poor  NUTRITION DIAGNOSIS:   Increased nutrient needs related to acute illness(COVID 19) as evidenced by increased estimated needs.  GOAL:   Patient will meet greater than or equal to 90% of their needs  MONITOR:   PO intake, Supplement acceptance, Labs, Weight trends, Skin, I & O's  REASON FOR ASSESSMENT:   Consult Assessment of nutrition requirement/status  ASSESSMENT:   73 y.o. female with medical history significant for coronary artery disease from a peripheral arterial disease status post aortobiiliac stents, non-insulin-dependent type 2 diabetes and hypertension, who was diagnosed with COVID-19 on 09/29/2021 presents to the emergency room with a complaint of increasing shortness of breath and weakness.   Unable to reach pt by phone. Suspect pt with poor appetite and oral intake pta r/t COVID 19. RD will add supplements and vitamins to help pt meet her estimated needs. Recommend consider liberalizing pt's diet if her intake is poor. Per chart, pt down 5lbs(3%) from her UBW but appears fairly weight stable at baseline. Will attempt to obtain nutrition related history at follow up.   Medications reviewed and include: vitamin C, lovenox, insulin, solu-medrol, MVI, zinc  Labs reviewed: Na 134(L), BUN 34(H), P 3.7 wnl, Mg 1.7 wnl Wbc- 2.9(L) cbgs- 232, 219 x 24 hrs AIC 5.9(H)- 12/23  Unable to complete Nutrition-Focused physical exam at this time as pt with COVID 19.   Diet Order:   Diet Order            Diet Heart Room service appropriate? Yes; Fluid consistency: Thin  Diet effective now             EDUCATION NEEDS:   Education needs have been addressed  Skin:  Skin Assessment: Reviewed RN Assessment(VSUs BLE)  Last BM:   pta  Height:   Ht Readings from Last 1 Encounters:  10/07/19 4\' 11"  (1.499 m)    Weight:   Wt Readings from Last 1 Encounters:  10/07/19 77.1 kg    Ideal Body Weight:  44.5 kg  BMI:  Body mass index is 34.34 kg/m.  Estimated Nutritional Needs:   Kcal:  1700-2000kcal/day  Protein:  85-100g/day  Fluid:  >1.3L/day  Koleen Distance MS, RD, LDN Pager #- [phone] Office#- [phone] After Hours Pager: [phone]

## 2019-10-08 NOTE — TOC Initial Note (Signed)
Transition of Care [facility]) - Initial/Assessment Note    Patient Details  Name: Christina Blackburn MRN: [id number] Date of Birth: 04-25-1946  [facility] CM/SW Contact:    Victorino Dike, RN Phone Number: 10/08/2019, 11:19 AM  Clinical Narrative:                   Expected Discharge Plan: Rest Home Barriers to Discharge: [place] (PASRR), Continued Medical Work up, Other (comment)   Patient Goals and CMS Choice        Expected Discharge Plan and Services Expected Discharge Plan: Rest Home In-house Referral: NA Discharge Planning Services: CM Consult, NA   Living arrangements for the past 2 months: [place], Single Family Home                 DME Arranged: Crutches                    Prior Living Arrangements/Services Living arrangements for the past 2 months: [place], Single Family Home                     Activities of Daily Living Home Assistive Devices/Equipment: CBG Meter, Eyeglasses, Dentures (specify type) ADL Screening (condition at time of admission) Patient's cognitive ability adequate to safely complete daily activities?: Yes Is the patient deaf or have difficulty hearing?: No Does the patient have difficulty seeing, even when wearing glasses/contacts?: No Does the patient have difficulty concentrating, remembering, or making decisions?: Yes Patient able to express need for assistance with ADLs?: Yes Does the patient have difficulty dressing or bathing?: Yes Independently performs ADLs?: Yes (appropriate for developmental age) Does the patient have difficulty walking or climbing stairs?: Yes Weakness of Legs: Both Weakness of Arms/Hands: None  Permission Sought/Granted                  Emotional Assessment              Admission diagnosis:  Acute respiratory failure with hypoxia ([place]) [J96.01] Pneumonia due to COVID-19 virus [U07.1, J12.89] Patient Active Problem List    Diagnosis Date Noted  . Acute respiratory failure with hypoxia ([place]) 10/07/2019  . Pneumonia due to COVID-19 virus 10/07/2019  . CHF (congestive heart failure) ([place]) 09/24/2019  . Cellulitis 06/17/2019  . Dermatitis 03/23/2019  . Surgical site infection 11/19/2018  . Atherosclerosis of native arteries of extremity with intermittent claudication ([place]) 11/12/2018  . Preop cardiovascular exam 09/18/2018  . Atherosclerosis of native arteries of extremity with rest pain ([place]) 07/16/2018  . Obesity (BMI 30.0-34.9) 04/22/2018  . 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 Rotator cuff tendinitis, left 03/26/2018  . Spondylosis of cervical region without myelopathy or radiculopathy 03/26/2018  . PAD (peripheral artery disease) ([place]) 05/16/2017  . Non-insulin treated type 2 diabetes mellitus ([place]) 05/16/2017  . Essential hypertension 05/16/2017  . Hyperlipidemia 05/16/2017  . Hx of cardiac cath 05/16/2015  . ASCVD (arteriosclerotic cardiovascular disease) 03/25/2014  . H/O CHF 03/25/2014  . H/O: hysterectomy 03/25/2014  . 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   PCP:  Idelle Crouch, MD Pharmacy:   [facility] [address], [place] - [place] [address] Phone: [phone] Fax: [phone]  [facility] - [place], [place] [address] Phone: [phone] Fax: [place], [place] -  [place] [address] [place] [address] Phone: [phone] Fax: [phone]  [id number] [address], [place] [address] Phone: [phone] Fax: [phone]     Social Determinants of Health (SDOH) Interventions    Readmission Risk Interventions Readmission Risk Prevention Plan 10/08/2019  Transportation Screening Complete   Palliative Care Screening Complete  Some recent data might be hidden

## 2019-10-08 NOTE — Progress Notes (Signed)
RT called by RN reference to SpO2 mid 80% range on 8lpm [place].  Confirmed with pulse oximetry once at bedside.  Pt non-distressed, but mildly tachpyneic.  Placed on HFNC and titrated up to 12lpm.  SpO2 90-91% at rest.  MD notified.

## 2019-10-08 NOTE — Consult Note (Signed)
[place] Nurse Consult Note: Patient receiving care in [facility].  Consult completed remotely after review of record, in particular the flowsheet section for wound specifics.  Photos of area not available. Reason for Consult: lower left lateral leg wound Wound type: probable venous stasis given the edema in the LEs Pressure Injury POA: Yes/No/NA Measurement, Wound bed, Drainage (amount, consistency, odor), Periwound: see flowsheet section Dressing procedure/placement/frequency:  Wash LLE with soap and water. Pat dry, apply [place]ween Moisturizing ointment (pink and white tube in clean utility) to leg. Place as many Xeroform gauzes over the wound area as necessary to cover it.  Beginning behind the toes and going to just below the knee, spiral wrap kerlex, then a 4 inch Ace wrap in the same manner.  Change daily. Monitor the wound area(s) for worsening of condition such as: Signs/symptoms of infection,  Increase in size,  Development of or worsening of odor, Development of pain, or increased pain at the affected locations.  Notify the medical team if any of these develop.  Thank you for the consult.  [place] nurse will not follow at this time.  Please re-consult the [place] team if needed.  Val Riles, RN, MSN, CWOCN, CNS-BC, pager [phone]

## 2019-10-08 NOTE — TOC Initial Note (Signed)
Transition of Care [facility]) - Initial/Assessment Note    Patient Details  Name: Christina Blackburn MRN: [id number] Date of Birth: [birth year]  Transition of Care [facility]) CM/SW Contact:    Victorino Dike, RN Phone Number: 10/08/2019, 11:12 AM  Clinical Narrative:                  Risk for Readmission assessment complete.    Patient did not answer phone in room.  Spoke with Daughter Wells Guiles on phone, she reports patient lives at home alone.  Patient is independent with transportation, grocery shopping and picking up medications.  Patient has a primary care physician.  She uses [facility] or [facility] in [place] at this time.  She prefers [facility] because of drive-through.  Daughter reports that she lives close to her mother and is available to assist her if her mother has needs.     Expected Discharge Plan: Rest Home Barriers to Discharge: [place] (PASRR), Continued Medical Work up, Other (comment)   Patient Goals and CMS Choice        Expected Discharge Plan and Services Expected Discharge Plan: Rest Home In-house Referral: NA Discharge Planning Services: CM Consult, NA   Living arrangements for the past 2 months: [place], Single Family Home                 DME Arranged: Crutches                    Prior Living Arrangements/Services Living arrangements for the past 2 months: [place], Single Family Home                     Activities of Daily Living Home Assistive Devices/Equipment: CBG Meter, Eyeglasses, Dentures (specify type) ADL Screening (condition at time of admission) Patient's cognitive ability adequate to safely complete daily activities?: Yes Is the patient deaf or have difficulty hearing?: No Does the patient have difficulty seeing, even when wearing glasses/contacts?: No Does the patient have difficulty concentrating, remembering, or making decisions?: Yes Patient able to express need for assistance with  ADLs?: Yes Does the patient have difficulty dressing or bathing?: Yes Independently performs ADLs?: Yes (appropriate for developmental age) Does the patient have difficulty walking or climbing stairs?: Yes Weakness of Legs: Both Weakness of Arms/Hands: None  Permission Sought/Granted                  Emotional Assessment              Admission diagnosis:  Acute respiratory failure with hypoxia ([place]) [J96.01] Pneumonia due to COVID-19 virus [U07.1, J12.89] Patient Active Problem List   Diagnosis Date Noted  . Acute respiratory failure with hypoxia ([place]) 10/07/2019  . Pneumonia due to COVID-19 virus 10/07/2019  . CHF (congestive heart failure) ([place]) 09/24/2019  . Cellulitis 06/17/2019  . Dermatitis 03/23/2019  . Surgical site infection 11/19/2018  . Atherosclerosis of native arteries of extremity with intermittent claudication ([place]) 11/12/2018  . Preop cardiovascular exam 09/18/2018  . Atherosclerosis of native arteries of extremity with rest pain ([place]) 07/16/2018  . Obesity (BMI 30.0-34.9) 04/22/2018  . 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 Rotator cuff tendinitis, left 03/26/2018  . Spondylosis of cervical region without myelopathy or radiculopathy 03/26/2018  . PAD (peripheral artery disease) ([place]) 05/16/2017  . Non-insulin treated type 2 diabetes mellitus ([place]) 05/16/2017  . Essential hypertension 05/16/2017  . Hyperlipidemia 05/16/2017  . Hx of cardiac cath 05/16/2015  .  ASCVD (arteriosclerotic cardiovascular disease) 03/25/2014  . H/O CHF 03/25/2014  . H/O: hysterectomy 03/25/2014  . 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 [place] - [place] [address] [place] [address] Phone: [phone] Fax: [phone]  [facility] - [place], [place] [address] Phone:  [phone] Fax: [facility] [address], [place] - [place] [facility] [address] [place] [facility] [place] [address] Phone: [phone] Fax: [phone]  [id number] [address], [place] [address] Phone: [phone] Fax: [phone]     Social Determinants of Health (SDOH) Interventions    Readmission Risk Interventions Readmission Risk Prevention Plan 10/08/2019  Transportation Screening Complete  Palliative Care Screening Complete  Some recent data might be hidden

## 2019-10-08 NOTE — Discharge Summary (Signed)
Physician Discharge Summary  Dmani Franceschini [id number] DOB: 1946/05/31 DOA: 10/07/2019  PCP: Idelle Crouch, MD  Admit date: 10/07/2019 Discharge date: 10/08/2019  Admitted From: Home Disposition:  [place]  Recommendations for Outpatient Follow-up:  1. Follow up per [facility] team  Home Health: N/A   Equipment/Devices: N/A   Discharge Condition: Guarded   CODE STATUS: Full  Diet recommendation: Heart Healthy  Brief/Interim Summary:  73 y.o. female with medical history significant for coronary artery disease from a peripheral arterial disease status post aortobiiliac stents, non-insulin-dependent type 2 diabetes and hypertension, who was diagnosed with COVID-19 on 09/29/2021 presents to the emergency room with a complaint of increasing shortness of breath and weakness.  She denies fever chills and admits to minimal nonproductive cough.  She denies chest pain, nausea, vomiting, diarrhea or abdominal pain.   On arrival in the emergency room, she had an O2 sat of 67% on room air improving to 94% on 6 L.  She was tachycardic at 104 and tachypneic at 24, blood pressure was 120/56 and she had a low-grade temperature of 99.8.  Chest x-ray showed extensive bilateral groundglass opacities and multifocal viral pneumonia.  Basic blood work of CBC and BMP showed mild leukopenia, mild anemia and minor electrolyte disturbance.  Troponin negative x2.  CRP pending.  Patient was started on IV dexamethasone and admission requested.  Continues on 5-6 L/min oxygen.  Transferring to [facility].    Discharge Diagnoses: Principal Problem:   Acute respiratory failure with hypoxia ([place]) Active Problems:   PAD (peripheral artery disease) ([facility])   Non-insulin treated type 2 diabetes mellitus ([place])   Essential hypertension   Pneumonia due to COVID-19 virus    Acute respiratory failure with hypoxia ([place]) secondary to COVID-19 pneumonia: --Patient was diagnosed Covid positive on 09/30/2019,  presented because of progressively worsening dyspnea and weakness --O2 sats on room air was 67%.  Currently saturating at 94% on 6 L --IV remdesivir, IV steroids, vitamins per protocol --Continue O2 to keep sats over 90% --CRP 8.9, considered Tocilizumab, however patient had mammogram 12/10 showing a mass suspicious for cancer.     PAD (peripheral artery disease) ([place]) --History of   post endarterectomy aorto/biiliac stents --Continue asa/Plavix, statin --No acute related issues --has chronic LLE nonhealing ulcer, follow at w/c clinic - continue wound care     Non-insulin treated type 2 diabetes mellitus ([place]) --Hold oral hypoglycemic agents --Insulin coverage --cont home gabapentin      Essential hypertension --Pressure is currently controlled --Continue amlodipine, losartan/HCTZ, lasix, metoprolol   CAD --History of a bare-metal stent in 2008 --Patient complains of no chest pain --Troponin x2 within normal level --Continue asa/Plavix, metoprolol    Discharge Instructions   Discharge Instructions    Diet - low sodium heart healthy   Complete by: As directed    Increase activity slowly   Complete by: As directed      Allergies as of 10/08/2019      Reactions   Tape Other (See Comments)   Hypoallergenic tape - blisters Blisters      Medication List    STOP taking these medications   aspirin 81 MG tablet Replaced by: aspirin 81 MG EC tablet   HYDROcodone-acetaminophen 5-325 MG tablet Commonly known as: NORCO/VICODIN     TAKE these medications   acetaminophen 325 MG tablet Commonly known as: TYLENOL Take 2 tablets (650 mg total) by mouth every 6 (six) hours as needed for mild pain or headache (fever >/= 101).  What changed:   medication strength  how much to take  reasons to take this   albuterol 108 (90 Base) MCG/ACT inhaler Commonly known as: VENTOLIN HFA Inhale 2 puffs into the lungs every 6 (six) hours as needed for wheezing or shortness  of breath.   amLODipine 5 MG tablet Commonly known as: NORVASC Take 5 mg by mouth daily.   ascorbic acid 500 MG tablet Commonly known as: VITAMIN C Take 1 tablet (500 mg total) by mouth daily. Start taking on: October 09, 2019   aspirin 81 MG EC tablet Take 1 tablet (81 mg total) by mouth daily. Start taking on: October 09, 2019 Replaces: aspirin 81 MG tablet   atorvastatin 20 MG tablet Commonly known as: LIPITOR Take 20 mg by mouth daily.   CALCIUM 600+D PO Take 1 tablet by mouth 2 (two) times daily.   cholecalciferol 25 MCG (1000 UT) tablet Commonly known as: VITAMIN D Take 1 tablet (1,000 Units total) by mouth daily. Start taking on: October 09, 2019 What changed:   medication strength  how much to take   clopidogrel 75 MG tablet Commonly known as: PLAVIX Take 1 tablet (75 mg total) by mouth daily. Start taking on: October 09, 2019   [employer] SLEEP AID PO Take 2 capsules by mouth at bedtime as needed (sleep).   DULoxetine 30 MG capsule Commonly known as: CYMBALTA Take 1 capsule (30 mg total) by mouth daily. Start taking on: October 09, 2019   enoxaparin 40 MG/0.4ML injection Commonly known as: LOVENOX Inject 0.4 mLs (40 mg total) into the skin daily.   feeding supplement (ENSURE ENLIVE) Liqd Take 237 mLs by mouth 3 (three) times daily between meals.   fenofibrate 160 MG tablet Take 1 tablet (160 mg total) by mouth daily. What changed:   medication strength  how much to take   fish oil-omega-3 fatty acids 1000 MG capsule Take 1 g by mouth daily.   furosemide 20 MG tablet Commonly known as: LASIX Take 20 mg by mouth daily as needed for fluid.   gabapentin 100 MG capsule Commonly known as: NEURONTIN Take 2 capsules (200 mg total) by mouth 3 (three) times daily. What changed: how much to take   glimepiride 4 MG tablet Commonly known as: AMARYL Take 4 mg by mouth daily with breakfast.   guaiFENesin-dextromethorphan 100-10 MG/5ML  syrup Commonly known as: ROBITUSSIN DM Take 10 mLs by mouth every 4 (four) hours as needed for cough.   insulin aspart 100 UNIT/ML injection Commonly known as: novoLOG Inject 0-20 Units into the skin 3 (three) times daily with meals.   losartan-hydrochlorothiazide 100-25 MG tablet Commonly known as: HYZAAR Take 1 tablet by mouth daily.   magnesium oxide 400 MG tablet Commonly known as: MAG-OX Take 400 mg by mouth daily.   metFORMIN 1000 MG tablet Commonly known as: GLUCOPHAGE Take 1,000 mg by mouth daily with breakfast.   methylPREDNISolone sodium succinate 40 mg/mL injection Commonly known as: SOLU-MEDROL Inject 0.48 mLs (19.2 mg total) into the vein every 12 (twelve) hours.   metoprolol succinate 100 MG 24 hr tablet Commonly known as: TOPROL-XL Take 1 tablet (100 mg total) by mouth daily. Take with or immediately following a meal. Start taking on: October 09, 2019   multivitamin with minerals Tabs tablet Take 1 tablet by mouth daily. Start taking on: October 09, 2019   senna-docusate 8.6-50 MG tablet Commonly known as: Senokot-S Take 1 tablet by mouth at bedtime as needed for mild constipation.  TURMERIC PO Take 1 capsule by mouth daily.   vitamin B-12 1000 MCG tablet Commonly known as: CYANOCOBALAMIN Take 1,000 mcg by mouth daily.   vitamin E 400 UNIT capsule Take 400 Units by mouth daily.   zinc sulfate 220 (50 Zn) MG capsule Take 1 capsule (220 mg total) by mouth daily. Start taking on: October 09, 2019       Allergies  Allergen Reactions  . Tape Other (See Comments)    Hypoallergenic tape - blisters Blisters    Consultations:  none    Procedures/Studies: DG Chest Portable 1 View  Result Date: 10/07/2019 CLINICAL DATA:  COVID positive EXAM: PORTABLE CHEST 1 VIEW COMPARISON:  February 09, 2016 FINDINGS: There is mild cardiomegaly. Extensive patchy/of the ground-glass opacities are seen throughout both lungs minimally within the mid to lower  lungs. No pleural effusion is seen. No acute osseous abnormality. The patient is status post right total shoulder arthroplasty. IMPRESSION: Extensive bilateral ground-glass opacities, consistent with multifocal viral pneumonia. Electronically Signed   By: Prudencio Pair M.D.   On: 10/07/2019 19:01   MM 3D SCREEN BREAST BILATERAL  Result Date: 09/25/2019 CLINICAL DATA:  Screening. EXAM: DIGITAL SCREENING BILATERAL MAMMOGRAM WITH TOMO AND CAD COMPARISON:  Previous exam(s). ACR Breast Density Category b: There are scattered areas of fibroglandular density. FINDINGS: In the left breast, a possible mass warrants further evaluation. This possible mass is seen within the upper inner quadrant of the LEFT breast, CC slice 22 and MLO slice 21, suspected fat necrosis. In the right breast, no findings suspicious for malignancy. Images were processed with CAD. IMPRESSION: Further evaluation is suggested for possible mass in the left breast. RECOMMENDATION: Diagnostic mammogram and possibly ultrasound of the left breast. (Code:[id number]) The patient will be contacted regarding the findings, and additional imaging will be scheduled. BI-RADS CATEGORY  0: Incomplete. Need additional imaging evaluation and/or prior mammograms for comparison. Electronically Signed   By: Franki Cabot M.D.   On: 09/25/2019 11:16   ABI  Result Date: 09/28/2019 LOWER EXTREMITY DOPPLER STUDY Indications: Claudication, and ulceration. High Risk Factors: Hypertension.  Vascular Interventions: 08/05/2018: Abdominal Aortogram, Lt Lower Extremity                         distal [place]. Comparison Study: 03/23/2019 Performing Technologist: Charlane Ferretti RT (R)(VS)  Examination Guidelines: A complete evaluation includes at minimum, Doppler waveform signals and systolic blood pressure reading at the level of bilateral brachial, anterior tibial, and posterior tibial arteries, when vessel segments are accessible. Bilateral testing is considered an integral  part of a complete examination. Photoelectric Plethysmograph (PPG) waveforms and toe systolic pressure readings are included as required and additional duplex testing as needed. Limited examinations for reoccurring indications may be performed as noted.  ABI Findings: +---------+------------------+-----+----------+----------------+ Right    Rt Pressure (mmHg)IndexWaveform  Comment          +---------+------------------+-----+----------+----------------+ Brachial 209                                               +---------+------------------+-----+----------+----------------+ ATA      158               0.74 monophasic                 +---------+------------------+-----+----------+----------------+ PTA  monophasicNon compressable +---------+------------------+-----+----------+----------------+ Great Toe104               0.49 Abnormal                   +---------+------------------+-----+----------+----------------+ +---------+------------------+-----+----------+----------------+ Left     Lt Pressure (mmHg)IndexWaveform  Comment          +---------+------------------+-----+----------+----------------+ Brachial 213                                               +---------+------------------+-----+----------+----------------+ ATA      136               0.64 monophasic                 +---------+------------------+-----+----------+----------------+ PTA                             monophasicNon compressable +---------+------------------+-----+----------+----------------+ Great Toe49                0.23 Abnormal                   +---------+------------------+-----+----------+----------------+ +-------+-----------+-----------+------------+------------+ ABI/TBIToday's ABIToday's TBIPrevious ABIPrevious TBI +-------+-----------+-----------+------------+------------+ Right  .74        .49        [place]          .35           +-------+-----------+-----------+------------+------------+ Left   .64        .23        [place]          .23          +-------+-----------+-----------+------------+------------+ Bilateral ABIs appear essentially unchanged compared to prior study on 03/23/2019. Bilateral TBIs appear essentially unchanged compared to prior study on 03/23/2019. Summary: Right: Resting right ankle-brachial index indicates moderate right lower extremity arterial disease. ABIs are unreliable. Right posterior tibial artery is non compressable suggesting medial calcification. Left: Resting left ankle-brachial index indicates moderate left lower extremity arterial disease. ABIs are unreliable. Left posterior tibial artery is non compressable suggesting medial calcification.  *See table(s) above for measurements and observations.  Electronically signed by [name] MD on 09/28/2019 at 5:15:25 PM.    Final        Subjective: Patient seen up in chair. Appears dyspneic.  She reports feeling slightly better.  Onset of symptoms roughly 1 week ago.  Denies fever/chill this AM.   Discharge Exam: Vitals:   10/08/19 0747 10/08/19 0748  BP: (!) 184/72 (!) 145/76  Pulse: 97 97  Resp: 19   Temp:    SpO2: 93%    Vitals:   10/07/19 2241 10/08/19 0318 10/08/19 0747 10/08/19 0748  BP: (!) 161/71 (!) 160/70 (!) 184/72 (!) 145/76  Pulse: 90 96 97 97  Resp: 20 20 19    Temp: 99.2 F (37.3 C) 97.9 F (36.6 C)    TempSrc: Oral Oral    SpO2: 94% 90% 93%   Weight:      Height:        General: Pt is alert, awake, not in acute distress Cardiovascular: RRR, S1/S2 +, no rubs, no gallops Respiratory: bibasilar crackles, overall diminished breath sounds, no wheezing, no rhonchi Abdominal: Soft, NT, ND, bowel sounds + Extremities: no edema, no cyanosis, left lower extremity dressing intact overlying nonhealing chronic ulcer    The results of  significant diagnostics from this hospitalization (including imaging, microbiology,  ancillary and laboratory) are listed below for reference.     Microbiology: Recent Results (from the past 240 hour(s))  Novel Coronavirus, NAA (Hosp order, Send-out to Ref Lab; TAT 18-24 hrs     Status: Abnormal   Collection Time: 09/30/19  4:46 PM   Specimen: Nasopharyngeal Swab; Respiratory  Result Value Ref Range Status   SARS-CoV-2, NAA DETECTED (A) NOT DETECTED Final    Comment: (NOTE)                  Client Requested Flag This nucleic acid amplification test was developed and its performance characteristics determined by [employer]. Nucleic acid amplification tests include PCR and TMA. This test has not been FDA cleared or approved. This test has been authorized by FDA under an Emergency Use Authorization (EUA). This test is only authorized for the duration of time the declaration that circumstances exist justifying the authorization of the emergency use of in vitro diagnostic tests for detection of SARS-CoV-2 virus and/or diagnosis of COVID-19 infection under section 564(b)(1) of the Act, 21 U.S.C. PT:2852782) (1), unless the authorization is terminated or revoked sooner. When diagnostic testing is negative, the possibility of a false negative result should be considered in the context of a patient's recent exposures and the presence of clinical signs and symptoms consistent with COVID-19. An individual without symptoms of COVID-  19 and who is not shedding SARS-CoV-2 virus would expect to have a negative (not detected) result in this assay. Performed At: [facility] [address] [place], [place] [id number] Katina Degree MDPhD [id number]    Coronavirus Source NASOPHARYNGEAL  Final    Comment: Performed at [facility], [address], [place], [place] [address]     Labs: BNP (last 3 results) No results for input(s): BNP in the last 8760 hours. Basic Metabolic Panel: Recent Labs  Lab 10/07/19 1734 10/08/19 0416  NA 132* 134*  K 3.3* 3.9   CL 91* 94*  CO2 27 23  GLUCOSE 93 268*  BUN 41* 34*  CREATININE 0.78 0.62  CALCIUM 8.6* 8.7*  MG  --  1.7  PHOS  --  3.7   Liver Function Tests: Recent Labs  Lab 10/08/19 0416  AST 32  ALT 14  ALKPHOS 37*  BILITOT 0.9  PROT 6.6  ALBUMIN 2.7*   No results for input(s): LIPASE, AMYLASE in the last 168 hours. No results for input(s): AMMONIA in the last 168 hours. CBC: Recent Labs  Lab 10/07/19 1734 10/08/19 0416  WBC 3.9* 2.9*  NEUTROABS  --  2.3  HGB 11.9* 11.6*  HCT 35.8* 33.3*  MCV 83.4 80.4  PLT 317 265   Cardiac Enzymes: No results for input(s): CKTOTAL, CKMB, CKMBINDEX, TROPONINI in the last 168 hours. BNP: Invalid input(s): POCBNP CBG: Recent Labs  Lab 10/07/19 1749 10/08/19 0217 10/08/19 0749 10/08/19 1156  GLUCAP 87 232* 219* 298*   D-Dimer No results for input(s): DDIMER in the last 72 hours. Hgb A1c Recent Labs    10/07/19 2320  HGBA1C 5.9*   Lipid Profile No results for input(s): CHOL, HDL, LDLCALC, TRIG, CHOLHDL, LDLDIRECT in the last 72 hours. Thyroid function studies No results for input(s): TSH, T4TOTAL, T3FREE, THYROIDAB in the last 72 hours.  Invalid input(s): FREET3 Anemia work up Recent Labs    10/08/19 0416  FERRITIN 225   Urinalysis    Component Value Date/Time   COLORURINE YELLOW (A) 10/07/2019 1736  APPEARANCEUR TURBID (A) 10/07/2019 1736   LABSPEC 1.023 10/07/2019 1736   PHURINE 7.0 10/07/2019 1736   GLUCOSEU 50 (A) 10/07/2019 1736   HGBUR NEGATIVE 10/07/2019 1736   BILIRUBINUR NEGATIVE 10/07/2019 1736   KETONESUR 5 (A) 10/07/2019 1736   PROTEINUR 30 (A) 10/07/2019 1736   NITRITE POSITIVE (A) 10/07/2019 1736   LEUKOCYTESUR NEGATIVE 10/07/2019 1736   Sepsis Labs Invalid input(s): PROCALCITONIN,  WBC,  LACTICIDVEN Microbiology Recent Results (from the past 240 hour(s))  Novel Coronavirus, NAA (Hosp order, Send-out to Ref Lab; TAT 18-24 hrs     Status: Abnormal   Collection Time: 09/30/19  4:46 PM    Specimen: Nasopharyngeal Swab; Respiratory  Result Value Ref Range Status   SARS-CoV-2, NAA DETECTED (A) NOT DETECTED Final    Comment: (NOTE)                  Client Requested Flag This nucleic acid amplification test was developed and its performance characteristics determined by [employer]. Nucleic acid amplification tests include PCR and TMA. This test has not been FDA cleared or approved. This test has been authorized by FDA under an Emergency Use Authorization (EUA). This test is only authorized for the duration of time the declaration that circumstances exist justifying the authorization of the emergency use of in vitro diagnostic tests for detection of SARS-CoV-2 virus and/or diagnosis of COVID-19 infection under section 564(b)(1) of the Act, 21 U.S.C. [id number]) (1), unless the authorization is terminated or revoked sooner. When diagnostic testing is negative, the possibility of a false negative result should be considered in the context of a patient's recent exposures and the presence of clinical signs and symptoms consistent with COVID-19. An individual without symptoms of COVID-  19 and who is not shedding SARS-CoV-2 virus would expect to have a negative (not detected) result in this assay. Performed At: [facility] [address] [place], [place] [id number] Katina Degree MDPhD [id number]    Coronavirus Source NASOPHARYNGEAL  Final    Comment: Performed at [facility], [address] [place], [place] [address]     Time coordinating discharge: Over 30 minutes  SIGNED:   Ezekiel Slocumb, DO [facility] 10/08/2019, 3:05 PM   If 7PM-7AM, please contact night-coverage [email or web address] Password [email or web address]

## 2019-10-09 ENCOUNTER — Encounter (HOSPITAL_COMMUNITY): Payer: Self-pay | Admitting: Internal Medicine

## 2019-10-09 ENCOUNTER — Inpatient Hospital Stay (HOSPITAL_COMMUNITY)
Admission: AD | Admit: 2019-10-09 | Discharge: 2019-10-17 | DRG: 177 | Disposition: A | Discharge status: Home Health | Payer: PPO | Source: Other Acute Inpatient Hospital | Attending: Family Medicine | Admitting: Family Medicine

## 2019-10-09 ENCOUNTER — Other Ambulatory Visit: Payer: Self-pay

## 2019-10-09 DIAGNOSIS — D649 Anemia, unspecified: Secondary | ICD-10-CM | POA: Diagnosis not present

## 2019-10-09 DIAGNOSIS — E878 Other disorders of electrolyte and fluid balance, not elsewhere classified: Secondary | ICD-10-CM | POA: Diagnosis present

## 2019-10-09 DIAGNOSIS — Z79899 Other long term (current) drug therapy: Secondary | ICD-10-CM | POA: Diagnosis not present

## 2019-10-09 DIAGNOSIS — E785 Hyperlipidemia, unspecified: Secondary | ICD-10-CM | POA: Diagnosis present

## 2019-10-09 DIAGNOSIS — J1282 Pneumonia due to coronavirus disease 2019: Secondary | ICD-10-CM | POA: Diagnosis not present

## 2019-10-09 DIAGNOSIS — E669 Obesity, unspecified: Secondary | ICD-10-CM | POA: Diagnosis present

## 2019-10-09 DIAGNOSIS — Z85828 Personal history of other malignant neoplasm of skin: Secondary | ICD-10-CM | POA: Diagnosis not present

## 2019-10-09 DIAGNOSIS — E041 Nontoxic single thyroid nodule: Secondary | ICD-10-CM | POA: Diagnosis present

## 2019-10-09 DIAGNOSIS — Z87891 Personal history of nicotine dependence: Secondary | ICD-10-CM

## 2019-10-09 DIAGNOSIS — E1151 Type 2 diabetes mellitus with diabetic peripheral angiopathy without gangrene: Secondary | ICD-10-CM | POA: Diagnosis present

## 2019-10-09 DIAGNOSIS — J9601 Acute respiratory failure with hypoxia: Secondary | ICD-10-CM | POA: Diagnosis not present

## 2019-10-09 DIAGNOSIS — Z6834 Body mass index (BMI) 34.0-34.9, adult: Secondary | ICD-10-CM | POA: Diagnosis not present

## 2019-10-09 DIAGNOSIS — I509 Heart failure, unspecified: Secondary | ICD-10-CM | POA: Diagnosis present

## 2019-10-09 DIAGNOSIS — Z8249 Family history of ischemic heart disease and other diseases of the circulatory system: Secondary | ICD-10-CM | POA: Diagnosis not present

## 2019-10-09 DIAGNOSIS — R0602 Shortness of breath: Secondary | ICD-10-CM | POA: Diagnosis not present

## 2019-10-09 DIAGNOSIS — U071 COVID-19: Secondary | ICD-10-CM | POA: Diagnosis not present

## 2019-10-09 DIAGNOSIS — Z7982 Long term (current) use of aspirin: Secondary | ICD-10-CM

## 2019-10-09 DIAGNOSIS — I110 Hypertensive heart disease with heart failure: Secondary | ICD-10-CM | POA: Diagnosis present

## 2019-10-09 DIAGNOSIS — I708 Atherosclerosis of other arteries: Secondary | ICD-10-CM | POA: Diagnosis not present

## 2019-10-09 DIAGNOSIS — Z91048 Other nonmedicinal substance allergy status: Secondary | ICD-10-CM | POA: Diagnosis not present

## 2019-10-09 DIAGNOSIS — I2510 Atherosclerotic heart disease of native coronary artery without angina pectoris: Secondary | ICD-10-CM | POA: Diagnosis not present

## 2019-10-09 DIAGNOSIS — Z7984 Long term (current) use of oral hypoglycemic drugs: Secondary | ICD-10-CM | POA: Diagnosis not present

## 2019-10-09 DIAGNOSIS — Z7902 Long term (current) use of antithrombotics/antiplatelets: Secondary | ICD-10-CM

## 2019-10-09 LAB — CBC WITH DIFFERENTIAL/PLATELET
Abs Immature Granulocytes: 0.06 10*3/uL (ref 0.00–0.07)
Basophils Absolute: 0 10*3/uL (ref 0.0–0.1)
Basophils Relative: 0 %
Eosinophils Absolute: 0 10*3/uL (ref 0.0–0.5)
Eosinophils Relative: 0 %
HCT: 35.3 % — ABNORMAL LOW (ref 36.0–46.0)
Hemoglobin: 11.5 g/dL — ABNORMAL LOW (ref 12.0–15.0)
Immature Granulocytes: 1 %
Lymphocytes Relative: 13 %
Lymphs Abs: 0.7 10*3/uL (ref 0.7–4.0)
MCH: 27.3 pg (ref 26.0–34.0)
MCHC: 32.6 g/dL (ref 30.0–36.0)
MCV: 83.6 fL (ref 80.0–100.0)
Monocytes Absolute: 0.3 10*3/uL (ref 0.1–1.0)
Monocytes Relative: 5 %
Neutro Abs: 4.3 10*3/uL (ref 1.7–7.7)
Neutrophils Relative %: 81 %
Platelets: 386 10*3/uL (ref 150–400)
RBC: 4.22 MIL/uL (ref 3.87–5.11)
RDW: 13.6 % (ref 11.5–15.5)
WBC: 5.3 10*3/uL (ref 4.0–10.5)
nRBC: 0 % (ref 0.0–0.2)

## 2019-10-09 LAB — COMPREHENSIVE METABOLIC PANEL
ALT: 14 U/L (ref 0–44)
AST: 28 U/L (ref 15–41)
Albumin: 2.7 g/dL — ABNORMAL LOW (ref 3.5–5.0)
Alkaline Phosphatase: 48 U/L (ref 38–126)
Anion gap: 13 (ref 5–15)
BUN: 51 mg/dL — ABNORMAL HIGH (ref 8–23)
CO2: 28 mmol/L (ref 22–32)
Calcium: 9.2 mg/dL (ref 8.9–10.3)
Chloride: 91 mmol/L — ABNORMAL LOW (ref 98–111)
Creatinine, Ser: 0.65 mg/dL (ref 0.44–1.00)
GFR calc Af Amer: >60 mL/min (ref >60)
GFR calc non Af Amer: >60 mL/min (ref >60)
Glucose, Bld: 228 mg/dL — ABNORMAL HIGH (ref 70–99)
Potassium: 4.6 mmol/L (ref 3.5–5.1)
Sodium: 132 mmol/L — ABNORMAL LOW (ref 135–145)
Total Bilirubin: 0.4 mg/dL (ref 0.3–1.2)
Total Protein: 6.9 g/dL (ref 6.5–8.1)

## 2019-10-09 LAB — FIBRIN DERIVATIVES D-DIMER (ARMC ONLY): Fibrin derivatives D-dimer (ARMC): 1944.35 ng/mL (FEU) — ABNORMAL HIGH (ref 0.00–499.00)

## 2019-10-09 LAB — FERRITIN: Ferritin: 231 ng/mL (ref 11–307)

## 2019-10-09 LAB — C-REACTIVE PROTEIN: CRP: 6.3 mg/dL — ABNORMAL HIGH (ref <1.0)

## 2019-10-09 LAB — PHOSPHORUS: Phosphorus: 3.1 mg/dL (ref 2.5–4.6)

## 2019-10-09 LAB — GLUCOSE, CAPILLARY
Glucose-Capillary: 220 mg/dL — ABNORMAL HIGH (ref 70–99)
Glucose-Capillary: 188 mg/dL — ABNORMAL HIGH (ref 70–99)
Glucose-Capillary: 381 mg/dL — ABNORMAL HIGH (ref 70–99)
Glucose-Capillary: 430 mg/dL — ABNORMAL HIGH (ref 70–99)

## 2019-10-09 LAB — MAGNESIUM: Magnesium: 2 mg/dL (ref 1.7–2.4)

## 2019-10-09 MED ORDER — OMEGA-3-ACID ETHYL ESTERS 1 G PO CAPS
1.0000 g | ORAL_CAPSULE | Freq: Two times a day (BID) | ORAL | Status: DC
  Administered 2019-10-09 – 2019-10-17 (×16): 1 g via ORAL
  Filled 2019-10-09 (×18): qty 1

## 2019-10-09 MED ORDER — INSULIN ASPART 100 UNIT/ML ~~LOC~~ SOLN
0.0000 [IU] | Freq: Every day | SUBCUTANEOUS | Status: DC
  Administered 2019-10-09 – 2019-10-10 (×2): 5 [IU] via SUBCUTANEOUS
  Administered 2019-10-11: 4 [IU] via SUBCUTANEOUS

## 2019-10-09 MED ORDER — INSULIN ASPART 100 UNIT/ML ~~LOC~~ SOLN
9.0000 [IU] | Freq: Three times a day (TID) | SUBCUTANEOUS | Status: DC
  Administered 2019-10-09 – 2019-10-13 (×11): 9 [IU] via SUBCUTANEOUS

## 2019-10-09 MED ORDER — ADULT MULTIVITAMIN W/MINERALS CH
1.0000 | ORAL_TABLET | Freq: Every day | ORAL | Status: DC
  Administered 2019-10-09 – 2019-10-17 (×9): 1 via ORAL
  Filled 2019-10-09 (×8): qty 1

## 2019-10-09 MED ORDER — SODIUM CHLORIDE 0.9% FLUSH
3.0000 mL | Freq: Two times a day (BID) | INTRAVENOUS | Status: DC
  Administered 2019-10-09 – 2019-10-17 (×17): 3 mL via INTRAVENOUS

## 2019-10-09 MED ORDER — VITAMIN B-12 1000 MCG PO TABS
1000.0000 ug | ORAL_TABLET | Freq: Every day | ORAL | Status: DC
  Administered 2019-10-09 – 2019-10-17 (×9): 1000 ug via ORAL
  Filled 2019-10-09 (×9): qty 1

## 2019-10-09 MED ORDER — SENNOSIDES-DOCUSATE SODIUM 8.6-50 MG PO TABS: 1.0000 | ORAL_TABLET | Freq: Every evening | ORAL | Status: DC | PRN

## 2019-10-09 MED ORDER — AMLODIPINE BESYLATE 5 MG PO TABS
10.0000 mg | ORAL_TABLET | Freq: Every day | ORAL | Status: DC
  Administered 2019-10-10 – 2019-10-17 (×8): 10 mg via ORAL
  Filled 2019-10-09 (×9): qty 2

## 2019-10-09 MED ORDER — GABAPENTIN 100 MG PO CAPS
200.0000 mg | ORAL_CAPSULE | Freq: Three times a day (TID) | ORAL | Status: DC
  Administered 2019-10-09 – 2019-10-17 (×25): 200 mg via ORAL
  Filled 2019-10-09 (×25): qty 2

## 2019-10-09 MED ORDER — METHYLPREDNISOLONE SODIUM SUCC 125 MG IJ SOLR
60.0000 mg | Freq: Two times a day (BID) | INTRAMUSCULAR | Status: DC
  Administered 2019-10-09 – 2019-10-10 (×3): 60 mg via INTRAVENOUS
  Filled 2019-10-09 (×3): qty 2

## 2019-10-09 MED ORDER — GLIMEPIRIDE 2 MG PO TABS
2.0000 mg | ORAL_TABLET | Freq: Every day | ORAL | Status: DC
  Administered 2019-10-09 – 2019-10-10 (×2): 2 mg via ORAL
  Filled 2019-10-09 (×4): qty 1

## 2019-10-09 MED ORDER — INSULIN ASPART 100 UNIT/ML ~~LOC~~ SOLN: 9.0000 [IU] | Freq: Three times a day (TID) | SUBCUTANEOUS | Status: DC

## 2019-10-09 MED ORDER — SODIUM CHLORIDE 0.9 % IV SOLN
100.0000 mg | Freq: Every day | INTRAVENOUS | Status: AC
  Administered 2019-10-09 – 2019-10-11 (×3): 100 mg via INTRAVENOUS
  Filled 2019-10-09 (×3): qty 20

## 2019-10-09 MED ORDER — ENOXAPARIN SODIUM 40 MG/0.4ML ~~LOC~~ SOLN
40.0000 mg | SUBCUTANEOUS | Status: DC
  Administered 2019-10-09 – 2019-10-17 (×9): 40 mg via SUBCUTANEOUS
  Filled 2019-10-09 (×9): qty 0.4

## 2019-10-09 MED ORDER — ENSURE ENLIVE PO LIQD
237.0000 mL | Freq: Three times a day (TID) | ORAL | Status: DC
  Administered 2019-10-09 – 2019-10-12 (×7): 237 mL via ORAL

## 2019-10-09 MED ORDER — INSULIN ASPART 100 UNIT/ML ~~LOC~~ SOLN: 3.0000 [IU] | Freq: Three times a day (TID) | SUBCUTANEOUS | Status: DC

## 2019-10-09 MED ORDER — ATORVASTATIN CALCIUM 10 MG PO TABS
20.0000 mg | ORAL_TABLET | Freq: Every day | ORAL | Status: DC
  Administered 2019-10-09 – 2019-10-16 (×8): 20 mg via ORAL
  Filled 2019-10-09 (×8): qty 2

## 2019-10-09 MED ORDER — SODIUM CHLORIDE 0.9 % IV SOLN
200.0000 mg | Freq: Once | INTRAVENOUS | Status: DC
  Filled 2019-10-09: qty 40

## 2019-10-09 MED ORDER — ZINC SULFATE 220 (50 ZN) MG PO CAPS
220.0000 mg | ORAL_CAPSULE | Freq: Every day | ORAL | Status: DC
  Administered 2019-10-09 – 2019-10-17 (×9): 220 mg via ORAL
  Filled 2019-10-09 (×9): qty 1

## 2019-10-09 MED ORDER — GLIMEPIRIDE 4 MG PO TABS
4.0000 mg | ORAL_TABLET | Freq: Every day | ORAL | Status: DC
  Filled 2019-10-09: qty 1

## 2019-10-09 MED ORDER — GUAIFENESIN-DM 100-10 MG/5ML PO SYRP
10.0000 mL | ORAL_SOLUTION | ORAL | Status: DC | PRN
  Administered 2019-10-14: 10 mL via ORAL
  Filled 2019-10-09: qty 10

## 2019-10-09 MED ORDER — ENOXAPARIN SODIUM 40 MG/0.4ML ~~LOC~~ SOLN: 40.0000 mg | SUBCUTANEOUS | Status: DC

## 2019-10-09 MED ORDER — PANTOPRAZOLE SODIUM 40 MG PO TBEC
40.0000 mg | DELAYED_RELEASE_TABLET | Freq: Every day | ORAL | Status: DC
  Administered 2019-10-09 – 2019-10-17 (×9): 40 mg via ORAL
  Filled 2019-10-09 (×9): qty 1

## 2019-10-09 MED ORDER — ACETAMINOPHEN 325 MG PO TABS
650.0000 mg | ORAL_TABLET | Freq: Four times a day (QID) | ORAL | Status: DC | PRN
  Administered 2019-10-09 – 2019-10-17 (×4): 650 mg via ORAL
  Filled 2019-10-09 (×4): qty 2

## 2019-10-09 MED ORDER — VITAMIN D3 25 MCG (1000 UNIT) PO TABS
1000.0000 [IU] | ORAL_TABLET | Freq: Every day | ORAL | Status: DC
  Administered 2019-10-09 – 2019-10-17 (×9): 1000 [IU] via ORAL
  Filled 2019-10-09 (×18): qty 1

## 2019-10-09 MED ORDER — DULOXETINE HCL 30 MG PO CPEP
30.0000 mg | ORAL_CAPSULE | Freq: Every day | ORAL | Status: DC
  Administered 2019-10-09 – 2019-10-17 (×9): 30 mg via ORAL
  Filled 2019-10-09 (×9): qty 1

## 2019-10-09 MED ORDER — FENOFIBRATE 160 MG PO TABS
160.0000 mg | ORAL_TABLET | Freq: Every day | ORAL | Status: DC
  Administered 2019-10-09 – 2019-10-17 (×9): 160 mg via ORAL
  Filled 2019-10-09 (×9): qty 1

## 2019-10-09 MED ORDER — ONDANSETRON HCL 4 MG PO TABS: 4.0000 mg | ORAL_TABLET | Freq: Four times a day (QID) | ORAL | Status: DC | PRN

## 2019-10-09 MED ORDER — MAGNESIUM OXIDE 400 MG PO TABS
400.0000 mg | ORAL_TABLET | Freq: Every day | ORAL | Status: DC
  Administered 2019-10-09 – 2019-10-17 (×9): 400 mg via ORAL
  Filled 2019-10-09 (×18): qty 1

## 2019-10-09 MED ORDER — ALBUTEROL SULFATE HFA 108 (90 BASE) MCG/ACT IN AERS
2.0000 | INHALATION_SPRAY | Freq: Four times a day (QID) | RESPIRATORY_TRACT | Status: DC | PRN
  Filled 2019-10-09: qty 6.7

## 2019-10-09 MED ORDER — INSULIN ASPART 100 UNIT/ML ~~LOC~~ SOLN
0.0000 [IU] | Freq: Three times a day (TID) | SUBCUTANEOUS | Status: DC
  Administered 2019-10-09: 15 [IU] via SUBCUTANEOUS
  Administered 2019-10-09: 3 [IU] via SUBCUTANEOUS
  Administered 2019-10-10 (×2): 11 [IU] via SUBCUTANEOUS
  Administered 2019-10-11: 2 [IU] via SUBCUTANEOUS
  Administered 2019-10-11: 18:00:00 8 [IU] via SUBCUTANEOUS
  Administered 2019-10-11: 12:00:00 5 [IU] via SUBCUTANEOUS

## 2019-10-09 MED ORDER — CLOPIDOGREL BISULFATE 75 MG PO TABS
75.0000 mg | ORAL_TABLET | Freq: Every day | ORAL | Status: DC
  Administered 2019-10-09 – 2019-10-17 (×9): 75 mg via ORAL
  Filled 2019-10-09 (×9): qty 1

## 2019-10-09 MED ORDER — ONDANSETRON HCL 4 MG/2ML IJ SOLN: 4.0000 mg | Freq: Four times a day (QID) | INTRAMUSCULAR | Status: DC | PRN

## 2019-10-09 MED ORDER — ASPIRIN 81 MG PO TBEC
81.0000 mg | DELAYED_RELEASE_TABLET | Freq: Every day | ORAL | Status: DC
  Administered 2019-10-09 – 2019-10-17 (×9): 81 mg via ORAL
  Filled 2019-10-09 (×17): qty 1

## 2019-10-09 MED ORDER — INSULIN ASPART 100 UNIT/ML ~~LOC~~ SOLN: 9.0000 [IU] | Freq: Three times a day (TID) | SUBCUTANEOUS | 11 refills | Status: DC

## 2019-10-09 MED ORDER — ASCORBIC ACID 500 MG PO TABS
500.0000 mg | ORAL_TABLET | Freq: Every day | ORAL | Status: DC
  Administered 2019-10-09 – 2019-10-17 (×9): 500 mg via ORAL
  Filled 2019-10-09 (×9): qty 1

## 2019-10-09 MED ORDER — AMLODIPINE BESYLATE 5 MG PO TABS: 5.0000 mg | ORAL_TABLET | Freq: Every day | ORAL | Status: DC

## 2019-10-09 MED ORDER — OMEGA-3 FATTY ACIDS 1000 MG PO CAPS
1.0000 g | ORAL_CAPSULE | Freq: Every day | ORAL | Status: DC
  Filled 2019-10-09: qty 1

## 2019-10-09 MED ORDER — SODIUM CHLORIDE 0.9 % IV SOLN: 100.0000 mg | Freq: Every day | INTRAVENOUS | Status: DC

## 2019-10-09 NOTE — Progress Notes (Signed)
PROGRESS NOTE                                                                                                                                                                                                             Patient Demographics:    Christina Blackburn, is a 73 y.o. female, DOB - June 13, 1946, VKP:[id number]  Outpatient Primary MD for the patient is Sparks, Leonie Douglas, MD    LOS - 0  Admit date - 10/09/2019    CC - SOB     Brief Narrative  coronary artery disease from a peripheral arterial disease status post aortobiiliac stents, non-insulin-dependent type 2 diabetes and hypertension, who was diagnosed with COVID-19 on 09/29/2021 presents to the emergency room with a complaint of increasing shortness of breath, she was diagnosed with acute hypoxic respiratory failure admitted to [facility] and then subsequently transferred to [facility] for further care.   Subjective:    Leighton Kooyman today has, No headache, No chest pain, No abdominal pain - No Nausea, No new weakness tingling or numbness, no SOB   Assessment  & Plan :     1. Acute Hypoxic Resp. Failure due to Acute Covid 19 Viral Pneumonitis during the ongoing 2020 Covid 19 Pandemic - she has mild to moderate disease, there was a confusion at [facility] that patient was on 14 L high flow oxygen as her pulse ox reading was low.  However upon arrival to [facility] on my examination patient appears comfortable, switched her probe and she is actually 93% on just 3 L nasal cannula and absolutely comfortable.  Will place her on Solu-Medrol and REMDESIVIR, monitor, patient and daughter have consented for Actemra use if needed.  Encouraged the patient to sit up in chair in the daytime use I-S and flutter valve for pulmonary toiletry and then prone in bed when at night.  Actemra off label use - patient was told that if COVID-19 pneumonitis gets worse we might potentially use Actemra off  label, patient denies any known history of tuberculosis or hepatitis, understands the risks and benefits and wants to proceed with Actemra treatment if required.   SpO2: 90 % O2 Flow Rate (L/min): 3 L/min  Recent Labs  Lab 10/07/19 2320 10/08/19 0416 10/09/19 0336  CRP 8.9* 9.4*  --   FERRITIN  --  225 231  Hepatic Function Latest Ref Rng & Units 10/09/2019 10/08/2019  Total Protein 6.5 - 8.1 g/dL 6.9 6.6  Albumin 3.5 - 5.0 g/dL 2.7(L) 2.7(L)  AST 15 - 41 U/L 28 32  ALT 0 - 44 U/L 14 14  Alk Phosphatase 38 - 126 U/L 48 37(L)  Total Bilirubin 0.3 - 1.2 mg/dL 0.4 0.9    2.  History of CAD, PAD endarterectomy along with aortic and bilateral iliac stents, continue dual antiplatelet therapy along with statin for secondary prevention.  Outpatient vascular surgery and PCP follow-up for risk factor modification.  3.  Obesity.  BMI of 34.  Follow with PCP.  4.  Hypertension.  For now Norvasc.  5.  Non-insulin-dependent DM type II.  Continue Amaryl, hold Glucophage, sliding scale and Premeal NovoLog and monitor.  Lab Results  Component Value Date   HGBA1C 5.9 (H) 10/07/2019   CBG (last 3)  Recent Labs    10/08/19 1657 10/08/19 2040 10/09/19 0808  GLUCAP 333* 219* 220*     Condition - Fair  Family Communication  :  Daughter 12/25   Code Status :  Full  Diet :   Diet Order            Diet Carb Modified Fluid consistency: Thin; Room service appropriate? Yes  Diet effective now               Disposition Plan  :  Home  Consults  :  None  Procedures  :  None  PUD Prophylaxis : PPI  DVT Prophylaxis  :  Lovenox    Lab Results  Component Value Date   PLT 386 10/09/2019    Inpatient Medications  Scheduled Meds: . [START ON 10/10/2019] amLODipine  10 mg Oral Daily  . ascorbic acid  500 mg Oral Daily  . aspirin  81 mg Oral Daily  . atorvastatin  20 mg Oral Daily  . cholecalciferol  1,000 Units Oral Daily  . clopidogrel  75 mg Oral Daily  . DULoxetine   30 mg Oral Daily  . enoxaparin (LOVENOX) injection  40 mg Subcutaneous Q24H  . feeding supplement (ENSURE ENLIVE)  237 mL Oral TID BM  . fenofibrate  160 mg Oral Daily  . fish oil-omega-3 fatty acids  1 g Oral Daily  . gabapentin  200 mg Oral TID  . [START ON 10/10/2019] glimepiride  4 mg Oral Q breakfast  . insulin aspart  0-15 Units Subcutaneous TID WC  . insulin aspart  0-5 Units Subcutaneous QHS  . insulin aspart  3 Units Subcutaneous TID WC  . insulin aspart  9 Units Subcutaneous TID WC  . magnesium oxide  400 mg Oral Daily  . methylPREDNISolone (SOLU-MEDROL) injection  60 mg Intravenous Q12H  . multivitamin with minerals  1 tablet Oral Daily  . pantoprazole  40 mg Oral Daily  . sodium chloride flush  3 mL Intravenous Q12H  . vitamin B-12  1,000 mcg Oral Daily  . zinc sulfate  220 mg Oral Daily   Continuous Infusions: . remdesivir 200 mg in sodium chloride 0.9% 250 mL IVPB     Followed by  . [START ON 10/10/2019] remdesivir 100 mg in NS 100 mL     PRN Meds:.acetaminophen, albuterol, guaiFENesin-dextromethorphan, ondansetron **OR** ondansetron (ZOFRAN) IV, senna-docusate  Antibiotics  :    Anti-infectives (From admission, onward)   Start     Dose/Rate Route Frequency Ordered Stop   10/10/19 1000  remdesivir 100 mg in sodium chloride 0.9 %  100 mL IVPB     100 mg 200 mL/hr over 30 Minutes Intravenous Daily 10/09/19 1109 10/14/19 0959   10/09/19 1115  remdesivir 200 mg in sodium chloride 0.9% 250 mL IVPB     200 mg 580 mL/hr over 30 Minutes Intravenous Once 10/09/19 1109         Time Spent in minutes  30   Lala Lund M.D on 10/09/2019 at 11:15 AM  To page go to [email or web address] - password [facility]  [facility] -  Office  [phone]  See all Orders from today for further details    Objective:   Vitals:   10/09/19 1028  BP: 126/73  Pulse: 60  Resp: 20  Temp: 98.4 F (36.9 C)  TempSrc: Axillary  SpO2: 90%  Weight: 74.6 kg    Wt Readings from Last  3 Encounters:  10/09/19 74.6 kg  10/07/19 77.1 kg  09/30/19 77.1 kg    No intake or output data in the 24 hours ending 10/09/19 1115   Physical Exam  Awake Alert, Oriented X 3, No new F.N deficits, Normal affect [place].AT,PERRAL Supple Neck,No JVD, No cervical lymphadenopathy appriciated.  Symmetrical Chest wall movement, Good air movement bilaterally, CTAB RRR,No Gallops,Rubs or new Murmurs, No Parasternal Heave +ve B.Sounds, Abd Soft, No tenderness, No organomegaly appriciated, No rebound - guarding or rigidity. No Cyanosis, Clubbing or edema, No new Rash or bruise       Data Review:    CBC Recent Labs  Lab 10/07/19 1734 10/08/19 0416 10/09/19 0336  WBC 3.9* 2.9* 5.3  HGB 11.9* 11.6* 11.5*  HCT 35.8* 33.3* 35.3*  PLT 317 265 386  MCV 83.4 80.4 83.6  MCH 27.7 28.0 27.3  MCHC 33.2 34.8 32.6  RDW 13.8 13.5 13.6  LYMPHSABS  --  0.5* 0.7  MONOABS  --  0.1 0.3  EOSABS  --  0.0 0.0  BASOSABS  --  0.0 0.0    Chemistries  Recent Labs  Lab 10/07/19 1734 10/08/19 0416 10/09/19 0336  NA 132* 134* 132*  K 3.3* 3.9 4.6  CL 91* 94* 91*  CO2 27 23 28   GLUCOSE 93 268* 228*  BUN 41* 34* 51*  CREATININE 0.78 0.62 0.65  CALCIUM 8.6* 8.7* 9.2  MG  --  1.7 2.0  AST  --  32 28  ALT  --  14 14  ALKPHOS  --  37* 48  BILITOT  --  0.9 0.4   ------------------------------------------------------------------------------------------------------------------ No results for input(s): CHOL, HDL, LDLCALC, TRIG, CHOLHDL, LDLDIRECT in the last 72 hours.  Lab Results  Component Value Date   HGBA1C 5.9 (H) 10/07/2019   ------------------------------------------------------------------------------------------------------------------ No results for input(s): TSH, T4TOTAL, T3FREE, THYROIDAB in the last 72 hours.  Invalid input(s): FREET3  Cardiac Enzymes No results for input(s): CKMB, TROPONINI, MYOGLOBIN in the last 168 hours.  Invalid input(s):  CK ------------------------------------------------------------------------------------------------------------------ No results found for: BNP  Micro Results Recent Results (from the past 240 hour(s))  Novel Coronavirus, NAA (Hosp order, Send-out to Ref Lab; TAT 18-24 hrs     Status: Abnormal   Collection Time: 09/30/19  4:46 PM   Specimen: Nasopharyngeal Swab; Respiratory  Result Value Ref Range Status   SARS-CoV-2, NAA DETECTED (A) NOT DETECTED Final    Comment: (NOTE)                  Client Requested Flag This nucleic acid amplification test was developed and its performance characteristics determined by [employer]. Nucleic acid amplification tests include  PCR and TMA. This test has not been FDA cleared or approved. This test has been authorized by FDA under an Emergency Use Authorization (EUA). This test is only authorized for the duration of time the declaration that circumstances exist justifying the authorization of the emergency use of in vitro diagnostic tests for detection of SARS-CoV-2 virus and/or diagnosis of COVID-19 infection under section 564(b)(1) of the Act, 21 U.S.C. 914NWG-9(F) (1), unless the authorization is terminated or revoked sooner. When diagnostic testing is negative, the possibility of a false negative result should be considered in the context of a patient's recent exposures and the presence of clinical signs and symptoms consistent with COVID-19. An individual without symptoms of COVID-  19 and who is not shedding SARS-CoV-2 virus would expect to have a negative (not detected) result in this assay. Performed At: [facility] [address] [place], [place] [id number] Katina Degree MDPhD QI:[id number]    Coronavirus Source NASOPHARYNGEAL  Final    Comment: Performed at [facility], [address] [place], [place] [address]    Radiology Reports DG Chest Portable 1 View  Result Date: 10/07/2019 CLINICAL DATA:  COVID  positive EXAM: PORTABLE CHEST 1 VIEW COMPARISON:  February 09, 2016 FINDINGS: There is mild cardiomegaly. Extensive patchy/of the ground-glass opacities are seen throughout both lungs minimally within the mid to lower lungs. No pleural effusion is seen. No acute osseous abnormality. The patient is status post right total shoulder arthroplasty. IMPRESSION: Extensive bilateral ground-glass opacities, consistent with multifocal viral pneumonia. Electronically Signed   By: Prudencio Pair M.D.   On: 10/07/2019 19:01   MM 3D SCREEN BREAST BILATERAL  Result Date: 09/25/2019 CLINICAL DATA:  Screening. EXAM: DIGITAL SCREENING BILATERAL MAMMOGRAM WITH TOMO AND CAD COMPARISON:  Previous exam(s). ACR Breast Density Category b: There are scattered areas of fibroglandular density. FINDINGS: In the left breast, a possible mass warrants further evaluation. This possible mass is seen within the upper inner quadrant of the LEFT breast, CC slice 22 and MLO slice 21, suspected fat necrosis. In the right breast, no findings suspicious for malignancy. Images were processed with CAD. IMPRESSION: Further evaluation is suggested for possible mass in the left breast. RECOMMENDATION: Diagnostic mammogram and possibly ultrasound of the left breast. (Code:[id number]) The patient will be contacted regarding the findings, and additional imaging will be scheduled. BI-RADS CATEGORY  0: Incomplete. Need additional imaging evaluation and/or prior mammograms for comparison. Electronically Signed   By: Franki Cabot M.D.   On: 09/25/2019 11:16   ABI  Result Date: 09/28/2019 LOWER EXTREMITY DOPPLER STUDY Indications: Claudication, and ulceration. High Risk Factors: Hypertension.  Vascular Interventions: 08/05/2018: Abdominal Aortogram, Lt Lower Extremity                         distal Runoff. Comparison Study: 03/23/2019 Performing Technologist: Charlane Ferretti RT (R)(VS)  Examination Guidelines: A complete evaluation includes at minimum, Doppler  waveform signals and systolic blood pressure reading at the level of bilateral brachial, anterior tibial, and posterior tibial arteries, when vessel segments are accessible. Bilateral testing is considered an integral part of a complete examination. Photoelectric Plethysmograph (PPG) waveforms and toe systolic pressure readings are included as required and additional duplex testing as needed. Limited examinations for reoccurring indications may be performed as noted.  ABI Findings: +---------+------------------+-----+----------+----------------+ Right    Rt Pressure (mmHg)IndexWaveform  Comment          +---------+------------------+-----+----------+----------------+ Brachial 209                                               +---------+------------------+-----+----------+----------------+  ATA      158               0.74 monophasic                 +---------+------------------+-----+----------+----------------+ PTA                             monophasicNon compressable +---------+------------------+-----+----------+----------------+ Great Toe104               0.49 Abnormal                   +---------+------------------+-----+----------+----------------+ +---------+------------------+-----+----------+----------------+ Left     Lt Pressure (mmHg)IndexWaveform  Comment          +---------+------------------+-----+----------+----------------+ Brachial 213                                               +---------+------------------+-----+----------+----------------+ ATA      136               0.64 monophasic                 +---------+------------------+-----+----------+----------------+ PTA                             monophasicNon compressable +---------+------------------+-----+----------+----------------+ Great Toe49                0.23 Abnormal                   +---------+------------------+-----+----------+----------------+  +-------+-----------+-----------+------------+------------+ ABI/TBIToday's ABIToday's TBIPrevious ABIPrevious TBI +-------+-----------+-----------+------------+------------+ Right  .74        .49        [place]          .35          +-------+-----------+-----------+------------+------------+ Left   .64        .23        [place]          .23          +-------+-----------+-----------+------------+------------+ Bilateral ABIs appear essentially unchanged compared to prior study on 03/23/2019. Bilateral TBIs appear essentially unchanged compared to prior study on 03/23/2019. Summary: Right: Resting right ankle-brachial index indicates moderate right lower extremity arterial disease. ABIs are unreliable. Right posterior tibial artery is non compressable suggesting medial calcification. Left: Resting left ankle-brachial index indicates moderate left lower extremity arterial disease. ABIs are unreliable. Left posterior tibial artery is non compressable suggesting medial calcification.  *See table(s) above for measurements and observations.  Electronically signed by [name] MD on 09/28/2019 at 5:15:25 PM.    Final

## 2019-10-09 NOTE — H&P (Signed)
History and Physical       This is a no charge H&P, patient was admitted at [facility] by one of our partners 2 days ago and discharged yesterday from [facility] for transfer to [facility].  Kindly see my progress note from 10/09/2019 for todays details.    Christina Blackburn [id number] DOB: 03-18-1946 DOA: 10/09/2019   PCP: Idelle Crouch, MD   Patient coming from:  home  Chief Complaint: Shortness of breath  HPI: Christina Blackburn is a 73 y.o. female with medical history significant for coronary artery disease from a peripheral arterial disease status post aortobiiliac stents, non-insulin-dependent type 2 diabetes and hypertension, who was diagnosed with COVID-19 on 09/29/2021 presents to the emergency room with a complaint of increasing shortness of breath and weakness.  She denies fever chills and admits to minimal nonproductive cough.  She denies chest pain, nausea, vomiting, diarrhea or abdominal pain.   On arrival in the emergency room, she had an O2 sat of 67% on room air improving to 94% on 6 L.  She was tachycardic at 104 and tachypneic at 24, blood pressure was 120/56 and she had a low-grade temperature of 99.8.  Chest x-ray showed extensive bilateral groundglass opacities and multifocal viral pneumonia.  Basic blood work of CBC and BMP showed mild leukopenia, mild anemia and minor electrolyte disturbance.  Troponin negative x2.  CRP pending.  Patient was started on IV dexamethasone and admission requested.     Review of Systems: As mentioned in HPI.  10 point review of systems otherwise negative  General: no fevers, chills, but complains of weakness HEENT: no blurry vision, hearing changes or sore throat Respiratory: worsening dyspnea,  minimalcoughing, no wheezing CV: no chest pain, no palpitations GI: no nausea, vomiting, abdominal pain, diarrhea, constipation GU: no dysuria, burning on urination, increased urinary frequency, hematuria  Ext: no leg  edema Neuro: no unilateral weakness, numbness, or tingling, no vision change or hearing loss Skin: no rash, no skin tear., ulcers Musculoskeletal: No muscle spasm, no deformity, no limitation in range of movement Hematologic: No easy bruising., enlarged lymph nodes    Allergy:  Allergies  Allergen Reactions  . Tape Other (See Comments)    Hypoallergenic tape - blisters Blisters    Past Medical History:  Diagnosis Date  . Anemia   . CHF (congestive heart failure) ([place])   . Chicken pox   . Colon polyps   . Complication of anesthesia   . Coronary artery disease   . Cystitis 1986  . Diabetes mellitus without complication ([place]) [id number]  . Diverticulosis   . Heart disease 2008  . Hyperlipidemia   . Hypertension 2003  . PAD (peripheral artery disease) ([place])   . PONV (postoperative nausea and vomiting)   . Rotator cuff tendinitis   . Squamous cell skin cancer 2016    Past Surgical History:  Procedure Laterality Date  . ABDOMINAL HYSTERECTOMY  1989  . APPLICATION OF WOUND VAC Bilateral 12/02/2018   Procedure: APPLICATION OF WOUND VAC;  Surgeon: Katha Cabal, MD;  Location: [facility] ORS;  Service: Vascular;  Laterality: Bilateral;  . BREAST BIOPSY Left 2014   neg  . CARDIAC CATHETERIZATION     cornary stent  . [place]  . CHOLECYSTECTOMY  1987  . COLONOSCOPY    . COLONOSCOPY WITH PROPOFOL N/A 09/22/2018   Procedure: COLONOSCOPY WITH PROPOFOL;  Surgeon: Lollie Sails, MD;  Location: [facility] ENDOSCOPY;  Service: Endoscopy;  Laterality: N/A;  .  CORONARY ANGIOPLASTY  2008   stent x1  . ENDARTERECTOMY FEMORAL Bilateral 11/12/2018   Procedure: ENDARTERECTOMY FEMORAL;  Surgeon: Katha Cabal, MD;  Location: [facility] ORS;  Service: Vascular;  Laterality: Bilateral;  . INSERTION OF ILIAC STENT Bilateral 11/12/2018   Procedure: INSERTION OF ILIAC STENT;  Surgeon: Katha Cabal, MD;  Location: [facility] ORS;  Service: Vascular;  Laterality: Bilateral;  . JOINT  REPLACEMENT Right 04/12/2018   shoulder  . LOWER EXTREMITY ANGIOGRAPHY Left 08/05/2018   Procedure: LOWER EXTREMITY ANGIOGRAPHY;  Surgeon: Katha Cabal, MD;  Location: [place] CV LAB;  Service: Cardiovascular;  Laterality: Left;  . REVERSE SHOULDER ARTHROPLASTY Right 04/08/2018   Procedure: REVERSE SHOULDER ARTHROPLASTY;  Surgeon: Corky Mull, MD;  Location: [facility] ORS;  Service: Orthopedics;  Laterality: Right;  . TUBAL LIGATION    . WOUND DEBRIDEMENT Bilateral 12/02/2018   Procedure: DEBRIDEMENT WOUND;  Surgeon: Katha Cabal, MD;  Location: [facility] ORS;  Service: Vascular;  Laterality: Bilateral;    Social History:  reports that she quit smoking about 12 years ago. Her smoking use included cigarettes. She quit after 6.00 years of use. She has never used smokeless tobacco. She reports current alcohol use. She reports that she does not use drugs.  Family History:  Family History  Problem Relation Age of Onset  . Colon cancer Cousin 54  . Lymphoma Brother 68  . Lung cancer Mother   . Hypertension Father   . Kidney failure Father   . Breast cancer Neg Hx      Prior to Admission medications   Medication Sig Start Date End Date Taking? Authorizing Provider  amLODipine (NORVASC) 5 MG tablet Take 5 mg by mouth daily.   Yes [provider]  aspirin 81 MG tablet Take 81 mg by mouth daily.   Yes [provider]  atorvastatin (LIPITOR) 20 MG tablet Take 20 mg by mouth daily.  03/28/17  Yes [provider]  Calcium Carbonate-Vitamin D (CALCIUM 600+D PO) Take 1 tablet by mouth 2 (two) times daily.   Yes [provider]  clopidogrel (PLAVIX) 75 MG tablet Take 75 mg by mouth daily.   Yes [provider]  DULoxetine (CYMBALTA) 30 MG capsule Take 30 mg by mouth daily. 09/23/19  Yes [provider]  fenofibrate (TRICOR) 145 MG tablet Take 145 mg by mouth daily.   Yes [provider]  fish oil-omega-3 fatty acids 1000 MG capsule  Take 1 g by mouth daily.   Yes [provider]  furosemide (LASIX) 20 MG tablet Take 20 mg by mouth daily as needed for fluid.    Yes [provider]  gabapentin (NEURONTIN) 100 MG capsule Take 100-200 mg by mouth 3 (three) times daily.  09/23/19  Yes [provider]  glimepiride (AMARYL) 4 MG tablet Take 4 mg by mouth daily with breakfast.    Yes [provider]  losartan-hydrochlorothiazide (HYZAAR) 100-25 MG tablet Take 1 tablet by mouth daily.  04/30/17  Yes [provider]  magnesium oxide (MAG-OX) 400 MG tablet Take 400 mg by mouth daily.   Yes [provider]  metFORMIN (GLUCOPHAGE) 1000 MG tablet Take 1,000 mg by mouth daily with breakfast.    Yes [provider]  metoprolol succinate (TOPROL-XL) 100 MG 24 hr tablet Take 100 mg by mouth daily. Take with or immediately following a meal.   Yes [provider]  TURMERIC PO Take 1 capsule by mouth daily.    Yes [provider]  vitamin B-12 (CYANOCOBALAMIN) 1000 MCG tablet Take 1,000 mcg by mouth daily.   Yes [provider]  VITAMIN D PO Take 1 capsule by mouth daily.   Yes [provider]  vitamin E 400 UNIT capsule Take 400 Units by mouth daily.    Yes [provider]  acetaminophen (TYLENOL) 500 MG tablet Take 1,000 mg by mouth every 6 (six) hours as needed for moderate pain or headache.    [provider]  diphenhydrAMINE HCl, Sleep, ([facility] SLEEP AID PO) Take 2 capsules by mouth at bedtime as needed (sleep).    [provider]  HYDROcodone-acetaminophen (NORCO/VICODIN) 5-325 MG tablet 1 tab po bid prn Patient not taking: Reported on 09/24/2019 09/18/19   Norval Gable, MD    Physical Exam: Vitals:   10/09/19 1028  BP: 126/73  Pulse: 60  Resp: 20  Temp: 98.4 F (36.9 C)  TempSrc: Axillary  SpO2: 90%  Weight: 74.6 kg   General: Not in acute distress, alert and oriented x 3 HEENT:       Eyes: PERRL, EOMI, no  scleral icterus.       ENT: No discharge from the ears and nose, no pharynx injection, no tonsillar enlargement.  Neck: No JVD, no bruit, no mass felt.no thyromegaly, adenopathy Heme: No lymph node enlargement. Cardiac: S1/S2, RRR, No murmurs, No gallops or rubs. Respiratory: Good air movement bilaterally. No rales, wheezing, rhonchi or rubs. GI: Soft, non distended, non tender, no organomegaly, BS present. GU: No hematuria Ext: No pitting leg edema bilaterally. 2+DP/PT pulse bilaterally. Musculoskeletal: No joint deformities, No joint redness or warmth, no limitation of ROM Skin: No rashes, ulcers, induration.  Neuro: Alert, oriented X3, cranial nerves II-XII grossly intact, moves all extremities normally. Muscle strength 5/5 in all extremities, sensation to light touch intact. Brachial reflex 2+ bilaterally. Knee reflex 1+ bilaterally. Negative Babinski's sign. Normal finger to nose test. Psych: good judgement and insight. No suicidal ideation  Labs on Admission: I have personally reviewed following labs and imaging studies  CBC: Recent Labs  Lab 10/07/19 1734 10/08/19 0416 10/09/19 0336  WBC 3.9* 2.9* 5.3  NEUTROABS  --  2.3 4.3  HGB 11.9* 11.6* 11.5*  HCT 35.8* 33.3* 35.3*  MCV 83.4 80.4 83.6  PLT 317 265 [id number]   Basic Metabolic Panel: Recent Labs  Lab 10/07/19 1734 10/08/19 0416 10/09/19 0336  NA 132* 134* 132*  K 3.3* 3.9 4.6  CL 91* 94* 91*  CO2 27 23 28   GLUCOSE 93 268* 228*  BUN 41* 34* 51*  CREATININE 0.78 0.62 0.65  CALCIUM 8.6* 8.7* 9.2  MG  --  1.7 2.0  PHOS  --  3.7 3.1   GFR: Estimated Creatinine Clearance: 56.8 mL/min (by C-G formula based on SCr of 0.65 mg/dL). Liver Function Tests: Recent Labs  Lab 10/08/19 0416 10/09/19 0336  AST 32 28  ALT 14 14  ALKPHOS 37* 48  BILITOT 0.9 0.4  PROT 6.6 6.9  ALBUMIN 2.7* 2.7*   No results for input(s): LIPASE, AMYLASE in the last 168 hours. No results for input(s): AMMONIA in the last 168  hours. Coagulation Profile: Recent Labs  Lab 10/07/19 2320  INR 1.0   Cardiac Enzymes: No results for input(s): CKTOTAL, CKMB, CKMBINDEX, TROPONINI in the last 168 hours. BNP (last 3 results) No results for input(s): PROBNP in the last 8760 hours. HbA1C: Recent Labs    10/07/19 2320  HGBA1C 5.9*   CBG: Recent Labs  Lab 10/08/19 0749  10/08/19 1156 10/08/19 1657 10/08/19 2040 10/09/19 0808  GLUCAP 219* 298* 333* 219* 220*   Lipid Profile: No results for input(s): CHOL, HDL, LDLCALC, TRIG, CHOLHDL, LDLDIRECT in the last 72 hours. Thyroid Function Tests: No results for input(s): TSH, T4TOTAL, FREET4, T3FREE, THYROIDAB in the last 72 hours. Anemia Panel: Recent Labs    10/08/19 0416 10/09/19 0336  FERRITIN 225 231   Urine analysis:    Component Value Date/Time   COLORURINE YELLOW (A) 10/07/2019 1736   APPEARANCEUR TURBID (A) 10/07/2019 1736   LABSPEC 1.023 10/07/2019 1736   PHURINE 7.0 10/07/2019 1736   GLUCOSEU 50 (A) 10/07/2019 1736   HGBUR NEGATIVE 10/07/2019 1736   BILIRUBINUR NEGATIVE 10/07/2019 1736   KETONESUR 5 (A) 10/07/2019 1736   PROTEINUR 30 (A) 10/07/2019 1736   NITRITE POSITIVE (A) 10/07/2019 1736   LEUKOCYTESUR NEGATIVE 10/07/2019 1736   Sepsis Labs: @LABRCNTIP (procalcitonin:4,lacticidven:4) ) Recent Results (from the past 240 hour(s))  Novel Coronavirus, NAA (Hosp order, Send-out to Ref Lab; TAT 18-24 hrs     Status: Abnormal   Collection Time: 09/30/19  4:46 PM   Specimen: Nasopharyngeal Swab; Respiratory  Result Value Ref Range Status   SARS-CoV-2, NAA DETECTED (A) NOT DETECTED Final    Comment: (NOTE)                  Client Requested Flag This nucleic acid amplification test was developed and its performance characteristics determined by [employer]. Nucleic acid amplification tests include PCR and TMA. This test has not been FDA cleared or approved. This test has been authorized by FDA under an Emergency Use Authorization  (EUA). This test is only authorized for the duration of time the declaration that circumstances exist justifying the authorization of the emergency use of in vitro diagnostic tests for detection of SARS-CoV-2 virus and/or diagnosis of COVID-19 infection under section 564(b)(1) of the Act, 21 U.S.C. [id number]) (1), unless the authorization is terminated or revoked sooner. When diagnostic testing is negative, the possibility of a false negative result should be considered in the context of a patient's recent exposures and the presence of clinical signs and symptoms consistent with COVID-19. An individual without symptoms of COVID-  19 and who is not shedding SARS-CoV-2 virus would expect to have a negative (not detected) result in this assay. Performed At: [facility] [address] [place], [place] [id number] Katina Degree MDPhD [id number]    Coronavirus Source NASOPHARYNGEAL  Final    Comment: Performed at [facility], [address], [place], [place] [address]     Radiological Exams on Admission: DG Chest Portable 1 View  Result Date: 10/07/2019 CLINICAL DATA:  COVID positive EXAM: PORTABLE CHEST 1 VIEW COMPARISON:  February 09, 2016 FINDINGS: There is mild cardiomegaly. Extensive patchy/of the ground-glass opacities are seen throughout both lungs minimally within the mid to lower lungs. No pleural effusion is seen. No acute osseous abnormality. The patient is status post right total shoulder arthroplasty. IMPRESSION: Extensive bilateral ground-glass opacities, consistent with multifocal viral pneumonia. Electronically Signed   By: Prudencio Pair M.D.   On: 10/07/2019 19:01     EKG: Independently reviewed.    Assessment/Plan    Acute respiratory failure with hypoxia (HCC) secondary to COVID-19 pneumonia: --Patient was diagnosed Covid positive on 09/30/2019, presented because of progressively worsening dyspnea and weakness --O2 sats on room air was 67%.  Currently  saturating at 94% on 6 L --IV remdesivir, IV steroids, vitamins per protocol --Continue O2 to keep sats  over 90% --CRP pending, if greater than 7, then consider Tocilizumab  Electrolyte disturbance --minor abnormalities on admission but will continue to monitor    PAD (peripheral artery disease) ([place]) --History of   post endarterectomy aorto/biiliac stents --No acute related issues --Continue antiplatelets and statins    Non-insulin treated type 2 diabetes mellitus ([place]) --Hold oral hypoglycemic agents --Insulin coverage    Essential hypertension --Pressure is currently controlled --Continue home meds once med rec is completed  CAD --History of a bare-metal stent in 2008 --Patient complains of no chest pain --Troponin x2 within normal level --Continue home meds      DVT prophylaxis: Lovenox Code Status: Full code Disposition Plan:  Anticipate discharge back to previous home environment Consults called: none Admission status: inpatient       Date of Service 10/09/2019    [place] [facility]   If 7PM-7AM, please contact night-coverage [email or web address] Password [email or web address] 10/09/2019, 11:12 AM

## 2019-10-09 NOTE — Progress Notes (Signed)
Flutter & Incentive given to pt.  Pt educated on the use and benefit of both devices.  Pt did flutter 5x.

## 2019-10-09 NOTE — Progress Notes (Signed)
Spoke with daughter and gave update and answered all questions.

## 2019-10-10 LAB — CBC WITH DIFFERENTIAL/PLATELET
Abs Immature Granulocytes: 0.06 10*3/uL (ref 0.00–0.07)
Basophils Absolute: 0 10*3/uL (ref 0.0–0.1)
Basophils Relative: 0 %
Eosinophils Absolute: 0 10*3/uL (ref 0.0–0.5)
Eosinophils Relative: 0 %
HCT: 37.5 % (ref 36.0–46.0)
Hemoglobin: 12.1 g/dL (ref 12.0–15.0)
Immature Granulocytes: 1 %
Lymphocytes Relative: 11 %
Lymphs Abs: 0.6 10*3/uL — ABNORMAL LOW (ref 0.7–4.0)
MCH: 27.7 pg (ref 26.0–34.0)
MCHC: 32.3 g/dL (ref 30.0–36.0)
MCV: 85.8 fL (ref 80.0–100.0)
Monocytes Absolute: 0.2 10*3/uL (ref 0.1–1.0)
Monocytes Relative: 4 %
Neutro Abs: 4.4 10*3/uL (ref 1.7–7.7)
Neutrophils Relative %: 84 %
Platelets: 413 10*3/uL — ABNORMAL HIGH (ref 150–400)
RBC: 4.37 MIL/uL (ref 3.87–5.11)
RDW: 13.6 % (ref 11.5–15.5)
WBC: 5.2 10*3/uL (ref 4.0–10.5)
nRBC: 0 % (ref 0.0–0.2)

## 2019-10-10 LAB — COMPREHENSIVE METABOLIC PANEL
ALT: 16 U/L (ref 0–44)
AST: 24 U/L (ref 15–41)
Albumin: 2.7 g/dL — ABNORMAL LOW (ref 3.5–5.0)
Alkaline Phosphatase: 61 U/L (ref 38–126)
Anion gap: 15 (ref 5–15)
BUN: 61 mg/dL — ABNORMAL HIGH (ref 8–23)
CO2: 27 mmol/L (ref 22–32)
Calcium: 9.6 mg/dL (ref 8.9–10.3)
Chloride: 91 mmol/L — ABNORMAL LOW (ref 98–111)
Creatinine, Ser: 0.71 mg/dL (ref 0.44–1.00)
GFR calc Af Amer: >60 mL/min (ref >60)
GFR calc non Af Amer: >60 mL/min (ref >60)
Glucose, Bld: 290 mg/dL — ABNORMAL HIGH (ref 70–99)
Potassium: 4.9 mmol/L (ref 3.5–5.1)
Sodium: 133 mmol/L — ABNORMAL LOW (ref 135–145)
Total Bilirubin: 0.5 mg/dL (ref 0.3–1.2)
Total Protein: 6.7 g/dL (ref 6.5–8.1)

## 2019-10-10 LAB — BRAIN NATRIURETIC PEPTIDE: B Natriuretic Peptide: 50.5 pg/mL (ref 0.0–100.0)

## 2019-10-10 LAB — GLUCOSE, CAPILLARY
Glucose-Capillary: 317 mg/dL — ABNORMAL HIGH (ref 70–99)
Glucose-Capillary: 372 mg/dL — ABNORMAL HIGH (ref 70–99)
Glucose-Capillary: 411 mg/dL — ABNORMAL HIGH (ref 70–99)

## 2019-10-10 LAB — ABO/RH: ABO/RH(D): O NEG

## 2019-10-10 LAB — MAGNESIUM: Magnesium: 1.9 mg/dL (ref 1.7–2.4)

## 2019-10-10 LAB — C-REACTIVE PROTEIN: CRP: 2.5 mg/dL — ABNORMAL HIGH (ref <1.0)

## 2019-10-10 LAB — D-DIMER, QUANTITATIVE: D-Dimer, Quant: 1.52 ug/mL-FEU — ABNORMAL HIGH (ref 0.00–0.50)

## 2019-10-10 MED ORDER — METHYLPREDNISOLONE SODIUM SUCC 125 MG IJ SOLR
60.0000 mg | Freq: Every day | INTRAMUSCULAR | Status: DC
  Administered 2019-10-11 – 2019-10-13 (×3): 60 mg via INTRAVENOUS
  Filled 2019-10-10 (×3): qty 2

## 2019-10-10 MED ORDER — GLIMEPIRIDE 2 MG PO TABS
2.0000 mg | ORAL_TABLET | Freq: Once | ORAL | Status: AC
  Administered 2019-10-10: 2 mg via ORAL
  Filled 2019-10-10: qty 1

## 2019-10-10 MED ORDER — FUROSEMIDE 10 MG/ML IJ SOLN
40.0000 mg | Freq: Once | INTRAMUSCULAR | Status: AC
  Administered 2019-10-10: 12:00:00 40 mg via INTRAVENOUS
  Filled 2019-10-10: qty 4

## 2019-10-10 MED ORDER — INSULIN ASPART 100 UNIT/ML ~~LOC~~ SOLN
30.0000 [IU] | SUBCUTANEOUS | Status: AC
  Administered 2019-10-10: 30 [IU] via SUBCUTANEOUS

## 2019-10-10 MED ORDER — GLIMEPIRIDE 4 MG PO TABS
4.0000 mg | ORAL_TABLET | Freq: Every day | ORAL | Status: DC
  Administered 2019-10-11 – 2019-10-14 (×4): 4 mg via ORAL
  Filled 2019-10-10 (×4): qty 1

## 2019-10-10 NOTE — Evaluation (Signed)
Physical Therapy Evaluation Patient Details Name: Christina Blackburn MRN: [id number] DOB: 01/19/1946 Today's Date: 10/10/2019   History of Present Illness  73 y/o female pt coronary artery disease from a peripheral arterial disease status post aortobiiliac stents, non-insulin-dependent type 2 diabetes and hypertension, who was diagnosed with COVID-19 on 09/29/2021 presents to the emergency room with a complaint of increasing shortness of breath, she was diagnosed with acute hypoxic respiratory failure admitted to [facility] and then subsequently transferred to [facility] for further care.  Clinical Impression   Pt admitted with above diagnosis. PTA states was living home alone and was independent with all ADLs and mobility with no assistive device. Pt currently with functional limitations due to the deficits listed below (see PT Problem List). This am pt did very well with mobility, she is moving slower than normal pace but able to complete all tasks given including ambulation in room 33ft with no AD and SBA/min guard assist. Pt was initially on 12L/min via HFNC, sats reading on monitor in mid 80s from earlobe probe, physician asked PT to treat symptomatically as probe was not proving accurate readings. Pt was able to ambulate around room on 10L/min via HFNC (sats on monitor reading desat to 79% but pt was not showing outwardly signs or symptoms of drastic desaturation), pt was able to carry on conversation with therapist while ambulating and was not in frank distress. Pt will benefit from skilled PT while in hospital to increase her overall strength, activity tolerance, independence and safety with mobility to allow discharge to the venue listed below.       Follow Up Recommendations Home health PT    Equipment Recommendations  None recommended by PT    Recommendations for Other Services OT consult     Precautions / Restrictions Precautions Precautions: Fall Precaution Comments: states  fell at [facility] yesterday (12/25)  Restrictions Weight Bearing Restrictions: No      Mobility  Bed Mobility               General bed mobility comments: pt sitting in recliener at therapist arrival to room  Transfers Overall transfer level: Needs assistance Equipment used: None Transfers: Sit to/from Stand;Stand Pivot Transfers Sit to Stand: Supervision;Min guard Stand pivot transfers: Supervision;Min guard          Ambulation/Gait Ambulation/Gait assistance: Supervision;Min guard Gait Distance (Feet): 44 Feet Assistive device: None Gait Pattern/deviations: Wide base of support Gait velocity: decreased   General Gait Details: ambulated in room on 10L/min via HFNC, physician had requested pt be treated sympomatically and not according to readings as they were not accurate to pts status. Pt was in no distress and was able to minimally carry on conversation with therapist while ambulating aorund room  Stairs            Wheelchair Mobility    Modified Rankin (Stroke Patients Only)       Balance Overall balance assessment: Mild deficits observed, not formally tested                                           Pertinent Vitals/Pain Pain Assessment: No/denies pain    Home Living Family/patient expects to be discharged to:: Private residence Living Arrangements: Alone Available Help at Discharge: Family;Available PRN/intermittently Type of Home: House Home Access: Stairs to enter Entrance Stairs-Rails: None Entrance Stairs-Number of Steps: 1 Home Layout: One level Home  Equipment: Gilford Rile - 4 wheels;Shower seat;Grab bars - tub/shower      Prior Function Level of Independence: Independent               Hand Dominance   Dominant Hand: Right    Extremity/Trunk Assessment   Upper Extremity Assessment Upper Extremity Assessment: Generalized weakness    Lower Extremity Assessment Lower Extremity Assessment: Generalized weakness     Cervical / Trunk Assessment Cervical / Trunk Assessment: Normal  Communication   Communication: No difficulties  Cognition Arousal/Alertness: Awake/alert Behavior During Therapy: WFL for tasks assessed/performed Overall Cognitive Status: Within Functional Limits for tasks assessed                                        General Comments      Exercises Other Exercises Other Exercises: incentive spirometer use Other Exercises: flutter valve use   Assessment/Plan    PT Assessment Patient needs continued PT services  PT Problem List Decreased strength;Decreased activity tolerance;Decreased balance;Decreased mobility;Decreased coordination       PT Treatment Interventions Gait training;Functional mobility training;Therapeutic activities;Therapeutic exercise;Balance training;Neuromuscular re-education    PT Goals (Current goals can be found in the Care Plan section)  Acute Rehab PT Goals Patient Stated Goal: to go home PT Goal Formulation: With patient Time For Goal Achievement: 10/24/19 Potential to Achieve Goals: Good    Frequency Min 3X/week   Barriers to discharge   lives home alone    Co-evaluation               AM-PAC PT "6 Clicks" Mobility  Outcome Measure Help needed turning from your back to your side while in a flat bed without using bedrails?: A Little Help needed moving from lying on your back to sitting on the side of a flat bed without using bedrails?: A Little Help needed moving to and from a bed to a chair (including a wheelchair)?: A Little Help needed standing up from a chair using your arms (e.g., wheelchair or bedside chair)?: A Little Help needed to walk in hospital room?: A Little Help needed climbing 3-5 steps with a railing? : A Lot 6 Click Score: 17    End of Session Equipment Utilized During Treatment: Oxygen Activity Tolerance: Patient limited by fatigue Patient left: in chair;with call bell/phone within reach Nurse  Communication: Mobility status PT Visit Diagnosis: Other abnormalities of gait and mobility (R26.89);Muscle weakness (generalized) (M62.81)    Time: [id number] PT Time Calculation (min) (ACUTE ONLY): 28 min   Charges:   PT Evaluation $PT Eval Moderate Complexity: 1 Mod PT Treatments $Gait Training: 8-22 mins        Horald Chestnut, PT   Delford Field 10/10/2019, 12:54 PM

## 2019-10-10 NOTE — Progress Notes (Signed)
PROGRESS NOTE                                                                                                                                                                                                             Patient Demographics:    Telisha Zawadzki, is a 73 y.o. female, DOB - 05-16-1946, DHW:[id number]  Outpatient Primary MD for the patient is Sparks, Leonie Douglas, MD    LOS - 1  Admit date - 10/09/2019    CC - SOB     Brief Narrative  coronary artery disease from a peripheral arterial disease status post aortobiiliac stents, non-insulin-dependent type 2 diabetes and hypertension, who was diagnosed with COVID-19 on 09/29/2021 presents to the emergency room with a complaint of increasing shortness of breath, she was diagnosed with acute hypoxic respiratory failure admitted to [facility] and then subsequently transferred to [facility] for further care.   Subjective:   Patient in bed, appears comfortable, denies any headache, no fever, no chest pain or pressure, no shortness of breath , no abdominal pain. No focal weakness.   Assessment  & Plan :     1. Acute Hypoxic Resp. Failure due to Acute Covid 19 Viral Pneumonitis during the ongoing 2020 Covid 19 Pandemic - she has mild to moderate disease, continue on combination of steroids and remdesivir, clinically improving CRP down to 2.5, in no respiratory distress, she has cold extremities and chronically hypoperfused which I think is giving [place] at the discrepancy in her pulse ox readings, clinically she is breathing about 15-18 a minute, is in no distress whatsoever, will continue to monitor.  If needed Actemra has been consented by her and her daughter.Marland Kitchen  SpO2: 90 % O2 Flow Rate (L/min): 12 L/min  Recent Labs  Lab 10/07/19 2320 10/08/19 0416 10/09/19 0336 10/10/19 0350  CRP 8.9* 9.4* 6.3* 2.5*  DDIMER  --   --   --  1.52*  FERRITIN  --  225 231  --     Hepatic  Function Latest Ref Rng & Units 10/10/2019 10/09/2019 10/08/2019  Total Protein 6.5 - 8.1 g/dL 6.7 6.9 6.6  Albumin 3.5 - 5.0 g/dL 2.7(L) 2.7(L) 2.7(L)  AST 15 - 41 U/L 24 28 32  ALT 0 - 44 U/L 16 14 14   Alk Phosphatase 38 - 126 U/L 61 48 37(L)  Total  Bilirubin 0.3 - 1.2 mg/dL 0.5 0.4 0.9    2.  History of CAD, PAD endarterectomy along with aortic and bilateral iliac stents, continue dual antiplatelet therapy along with statin for secondary prevention.  Outpatient vascular surgery and PCP follow-up for risk factor modification.  3.  Obesity.  BMI of 34.  Follow with PCP.  4.  Hypertension.  For now Norvasc.  5.  Non-insulin-dependent DM type II.  Have doubled Amaryl dose to improve glycemic control, sliding scale and Premeal NovoLog and monitor.  Stable outpatient control.  Lab Results  Component Value Date   HGBA1C 5.9 (H) 10/07/2019   CBG (last 3)  Recent Labs    10/09/19 2121 10/10/19 0820 10/10/19 1206  GLUCAP 430* 317* 372*     Condition - Fair  Family Communication  :  Daughter 12/25, 12/26  Code Status :  Full  Diet :   Diet Order            Diet Carb Modified Fluid consistency: Thin; Room service appropriate? Yes  Diet effective now               Disposition Plan  :  Home  Consults  :  None  Procedures  :  None  PUD Prophylaxis : PPI  DVT Prophylaxis  :  Lovenox    Lab Results  Component Value Date   PLT 413 (H) 10/10/2019    Inpatient Medications  Scheduled Meds: . amLODipine  10 mg Oral Daily  . ascorbic acid  500 mg Oral Daily  . aspirin  81 mg Oral Daily  . atorvastatin  20 mg Oral Daily  . cholecalciferol  1,000 Units Oral Daily  . clopidogrel  75 mg Oral Daily  . DULoxetine  30 mg Oral Daily  . enoxaparin (LOVENOX) injection  40 mg Subcutaneous Q24H  . feeding supplement (ENSURE ENLIVE)  237 mL Oral TID BM  . fenofibrate  160 mg Oral Daily  . gabapentin  200 mg Oral TID  . glimepiride  2 mg Oral Once  . [START ON 10/11/2019]  glimepiride  4 mg Oral Q breakfast  . insulin aspart  0-15 Units Subcutaneous TID WC  . insulin aspart  0-5 Units Subcutaneous QHS  . insulin aspart  9 Units Subcutaneous TID WC  . magnesium oxide  400 mg Oral Daily  . methylPREDNISolone (SOLU-MEDROL) injection  60 mg Intravenous Q12H  . multivitamin with minerals  1 tablet Oral Daily  . omega-3 acid ethyl esters  1 g Oral BID  . pantoprazole  40 mg Oral Daily  . sodium chloride flush  3 mL Intravenous Q12H  . vitamin B-12  1,000 mcg Oral Daily  . zinc sulfate  220 mg Oral Daily   Continuous Infusions: . remdesivir 100 mg in NS 100 mL 100 mg (10/10/19 0832)   PRN Meds:.acetaminophen, albuterol, guaiFENesin-dextromethorphan, ondansetron **OR** ondansetron (ZOFRAN) IV, senna-docusate  Antibiotics  :    Anti-infectives (From admission, onward)   Start     Dose/Rate Route Frequency Ordered Stop   10/10/19 1000  remdesivir 100 mg in sodium chloride 0.9 % 100 mL IVPB  Status:  Discontinued     100 mg 200 mL/hr over 30 Minutes Intravenous Daily 10/09/19 1109 10/09/19 1137   10/09/19 1145  remdesivir 100 mg in sodium chloride 0.9 % 100 mL IVPB     100 mg 200 mL/hr over 30 Minutes Intravenous Daily 10/09/19 1137 10/12/19 0959   10/09/19 1115  remdesivir 200 mg in sodium  chloride 0.9% 250 mL IVPB  Status:  Discontinued     200 mg 580 mL/hr over 30 Minutes Intravenous Once 10/09/19 1109 10/09/19 1137       Time Spent in minutes  30   Lala Lund M.D on 10/10/2019 at 1:33 PM  To page go to [email or web address] - password [facility]  [facility] -  Office  [phone]  See all Orders from today for further details    Objective:   Vitals:   10/10/19 0000 10/10/19 0357 10/10/19 0805 10/10/19 1229  BP: 116/61 96/80 95/81  108/61  Pulse: 66 73 69 81  Resp: 20 20 (!) 21   Temp: 97.7 F (36.5 C) 98.3 F (36.8 C) 98 F (36.7 C) 97.8 F (36.6 C)  TempSrc: Oral Oral Oral Oral  SpO2: 90% 90%    Weight:      Height:        Wt  Readings from Last 3 Encounters:  10/09/19 74.6 kg  10/07/19 77.1 kg  09/30/19 77.1 kg     Intake/Output Summary (Last 24 hours) at 10/10/2019 1333 Last data filed at 10/09/2019 2203 Gross per 24 hour  Intake 103 ml  Output --  Net 103 ml     Physical Exam  Awake Alert,  No new F.N deficits, Normal affect [place].AT,PERRAL Supple Neck,No JVD, No cervical lymphadenopathy appriciated.  Symmetrical Chest wall movement, Good air movement bilaterally, CTAB RRR,No Gallops, Rubs or new Murmurs, No Parasternal Heave +ve B.Sounds, Abd Soft, No tenderness, No organomegaly appriciated, No rebound - guarding or rigidity. No Cyanosis, Clubbing or edema, No new Rash or bruise       Data Review:    CBC Recent Labs  Lab 10/07/19 1734 10/08/19 0416 10/09/19 0336 10/10/19 0350  WBC 3.9* 2.9* 5.3 5.2  HGB 11.9* 11.6* 11.5* 12.1  HCT 35.8* 33.3* 35.3* 37.5  PLT 317 265 386 413*  MCV 83.4 80.4 83.6 85.8  MCH 27.7 28.0 27.3 27.7  MCHC 33.2 34.8 32.6 32.3  RDW 13.8 13.5 13.6 13.6  LYMPHSABS  --  0.5* 0.7 0.6*  MONOABS  --  0.1 0.3 0.2  EOSABS  --  0.0 0.0 0.0  BASOSABS  --  0.0 0.0 0.0    Chemistries  Recent Labs  Lab 10/07/19 1734 10/08/19 0416 10/09/19 0336 10/10/19 0350  NA 132* 134* 132* 133*  K 3.3* 3.9 4.6 4.9  CL 91* 94* 91* 91*  CO2 27 23 28 27   GLUCOSE 93 268* 228* 290*  BUN 41* 34* 51* 61*  CREATININE 0.78 0.62 0.65 0.71  CALCIUM 8.6* 8.7* 9.2 9.6  MG  --  1.7 2.0 1.9  AST  --  32 28 24  ALT  --  14 14 16   ALKPHOS  --  37* 48 61  BILITOT  --  0.9 0.4 0.5   ------------------------------------------------------------------------------------------------------------------ No results for input(s): CHOL, HDL, LDLCALC, TRIG, CHOLHDL, LDLDIRECT in the last 72 hours.  Lab Results  Component Value Date   HGBA1C 5.9 (H) 10/07/2019   ------------------------------------------------------------------------------------------------------------------ No results for  input(s): TSH, T4TOTAL, T3FREE, THYROIDAB in the last 72 hours.  Invalid input(s): FREET3  Cardiac Enzymes No results for input(s): CKMB, TROPONINI, MYOGLOBIN in the last 168 hours.  Invalid input(s): CK ------------------------------------------------------------------------------------------------------------------ No results found for: BNP  Micro Results Recent Results (from the past 240 hour(s))  Novel Coronavirus, NAA (Hosp order, Send-out to Ref Lab; TAT 18-24 hrs     Status: Abnormal   Collection Time: 09/30/19  4:46 PM  Specimen: Nasopharyngeal Swab; Respiratory  Result Value Ref Range Status   SARS-CoV-2, NAA DETECTED (A) NOT DETECTED Final    Comment: (NOTE)                  Client Requested Flag This nucleic acid amplification test was developed and its performance characteristics determined by [employer]. Nucleic acid amplification tests include PCR and TMA. This test has not been FDA cleared or approved. This test has been authorized by FDA under an Emergency Use Authorization (EUA). This test is only authorized for the duration of time the declaration that circumstances exist justifying the authorization of the emergency use of in vitro diagnostic tests for detection of SARS-CoV-2 virus and/or diagnosis of COVID-19 infection under section 564(b)(1) of the Act, 21 U.S.C. 416SAY-3(K) (1), unless the authorization is terminated or revoked sooner. When diagnostic testing is negative, the possibility of a false negative result should be considered in the context of a patient's recent exposures and the presence of clinical signs and symptoms consistent with COVID-19. An individual without symptoms of COVID-  19 and who is not shedding SARS-CoV-2 virus would expect to have a negative (not detected) result in this assay. Performed At: [facility] [address] [place], [place] [id number] Katina Degree MDPhD FT:[id number]    Coronavirus Source NASOPHARYNGEAL   Final    Comment: Performed at [facility], [address] [place], [place] [address]    Radiology Reports DG Chest Portable 1 View  Result Date: 10/07/2019 CLINICAL DATA:  COVID positive EXAM: PORTABLE CHEST 1 VIEW COMPARISON:  February 09, 2016 FINDINGS: There is mild cardiomegaly. Extensive patchy/of the ground-glass opacities are seen throughout both lungs minimally within the mid to lower lungs. No pleural effusion is seen. No acute osseous abnormality. The patient is status post right total shoulder arthroplasty. IMPRESSION: Extensive bilateral ground-glass opacities, consistent with multifocal viral pneumonia. Electronically Signed   By: Prudencio Pair M.D.   On: 10/07/2019 19:01   MM 3D SCREEN BREAST BILATERAL  Result Date: 09/25/2019 CLINICAL DATA:  Screening. EXAM: DIGITAL SCREENING BILATERAL MAMMOGRAM WITH TOMO AND CAD COMPARISON:  Previous exam(s). ACR Breast Density Category b: There are scattered areas of fibroglandular density. FINDINGS: In the left breast, a possible mass warrants further evaluation. This possible mass is seen within the upper inner quadrant of the LEFT breast, CC slice 22 and MLO slice 21, suspected fat necrosis. In the right breast, no findings suspicious for malignancy. Images were processed with CAD. IMPRESSION: Further evaluation is suggested for possible mass in the left breast. RECOMMENDATION: Diagnostic mammogram and possibly ultrasound of the left breast. (Code:[id number]) The patient will be contacted regarding the findings, and additional imaging will be scheduled. BI-RADS CATEGORY  0: Incomplete. Need additional imaging evaluation and/or prior mammograms for comparison. Electronically Signed   By: Franki Cabot M.D.   On: 09/25/2019 11:16   ABI  Result Date: 09/28/2019 LOWER EXTREMITY DOPPLER STUDY Indications: Claudication, and ulceration. High Risk Factors: Hypertension.  Vascular Interventions: 08/05/2018: Abdominal Aortogram, Lt Lower  Extremity                         distal [place]. Comparison Study: 03/23/2019 Performing Technologist: Charlane Ferretti RT (R)(VS)  Examination Guidelines: A complete evaluation includes at minimum, Doppler waveform signals and systolic blood pressure reading at the level of bilateral brachial, anterior tibial, and posterior tibial arteries, when vessel segments are accessible. Bilateral testing is considered an integral part of  a complete examination. Photoelectric Plethysmograph (PPG) waveforms and toe systolic pressure readings are included as required and additional duplex testing as needed. Limited examinations for reoccurring indications may be performed as noted.  ABI Findings: +---------+------------------+-----+----------+----------------+ Right    Rt Pressure (mmHg)IndexWaveform  Comment          +---------+------------------+-----+----------+----------------+ Brachial 209                                               +---------+------------------+-----+----------+----------------+ ATA      158               0.74 monophasic                 +---------+------------------+-----+----------+----------------+ PTA                             monophasicNon compressable +---------+------------------+-----+----------+----------------+ Great Toe104               0.49 Abnormal                   +---------+------------------+-----+----------+----------------+ +---------+------------------+-----+----------+----------------+ Left     Lt Pressure (mmHg)IndexWaveform  Comment          +---------+------------------+-----+----------+----------------+ Brachial 213                                               +---------+------------------+-----+----------+----------------+ ATA      136               0.64 monophasic                 +---------+------------------+-----+----------+----------------+ PTA                             monophasicNon compressable  +---------+------------------+-----+----------+----------------+ Great Toe49                0.23 Abnormal                   +---------+------------------+-----+----------+----------------+ +-------+-----------+-----------+------------+------------+ ABI/TBIToday's ABIToday's TBIPrevious ABIPrevious TBI +-------+-----------+-----------+------------+------------+ Right  .74        .49        [place]          .35          +-------+-----------+-----------+------------+------------+ Left   .64        .23        [place]          .23          +-------+-----------+-----------+------------+------------+ Bilateral ABIs appear essentially unchanged compared to prior study on 03/23/2019. Bilateral TBIs appear essentially unchanged compared to prior study on 03/23/2019. Summary: Right: Resting right ankle-brachial index indicates moderate right lower extremity arterial disease. ABIs are unreliable. Right posterior tibial artery is non compressable suggesting medial calcification. Left: Resting left ankle-brachial index indicates moderate left lower extremity arterial disease. ABIs are unreliable. Left posterior tibial artery is non compressable suggesting medial calcification.  *See table(s) above for measurements and observations.  Electronically signed by [name] MD on 09/28/2019 at 5:15:25 PM.    Final

## 2019-10-10 NOTE — Progress Notes (Signed)
MD at bedside and request that I discontinue the patient from telemetry monitoring  for about 1hr. All central monitoring has been removed.

## 2019-10-11 ENCOUNTER — Inpatient Hospital Stay (HOSPITAL_COMMUNITY): Payer: PPO

## 2019-10-11 LAB — CBC WITH DIFFERENTIAL/PLATELET
Abs Immature Granulocytes: 0.2 10*3/uL — ABNORMAL HIGH (ref 0.00–0.07)
Basophils Absolute: 0 10*3/uL (ref 0.0–0.1)
Basophils Relative: 0 %
Eosinophils Absolute: 0 10*3/uL (ref 0.0–0.5)
Eosinophils Relative: 0 %
HCT: 37.9 % (ref 36.0–46.0)
Hemoglobin: 12.3 g/dL (ref 12.0–15.0)
Immature Granulocytes: 2 %
Lymphocytes Relative: 10 %
Lymphs Abs: 0.9 10*3/uL (ref 0.7–4.0)
MCH: 27.8 pg (ref 26.0–34.0)
MCHC: 32.5 g/dL (ref 30.0–36.0)
MCV: 85.6 fL (ref 80.0–100.0)
Monocytes Absolute: 0.3 10*3/uL (ref 0.1–1.0)
Monocytes Relative: 4 %
Neutro Abs: 7.4 10*3/uL (ref 1.7–7.7)
Neutrophils Relative %: 84 %
Platelets: 454 10*3/uL — ABNORMAL HIGH (ref 150–400)
RBC: 4.43 MIL/uL (ref 3.87–5.11)
RDW: 13.5 % (ref 11.5–15.5)
WBC: 8.8 10*3/uL (ref 4.0–10.5)
nRBC: 0.2 % (ref 0.0–0.2)

## 2019-10-11 LAB — MAGNESIUM: Magnesium: 1.7 mg/dL (ref 1.7–2.4)

## 2019-10-11 LAB — POCT I-STAT 7, (LYTES, BLD GAS, ICA,H+H)
Acid-Base Excess: 5 mmol/L — ABNORMAL HIGH (ref 0.0–2.0)
Bicarbonate: 28.3 mmol/L — ABNORMAL HIGH (ref 20.0–28.0)
Calcium, Ion: 1.24 mmol/L (ref 1.15–1.40)
HCT: 35 % — ABNORMAL LOW (ref 36.0–46.0)
Hemoglobin: 11.9 g/dL — ABNORMAL LOW (ref 12.0–15.0)
O2 Saturation: 92 %
Potassium: 4.8 mmol/L (ref 3.5–5.1)
Sodium: 131 mmol/L — ABNORMAL LOW (ref 135–145)
TCO2: 29 mmol/L (ref 22–32)
pCO2 arterial: 37.4 mmHg (ref 32.0–48.0)
pH, Arterial: 7.488 — ABNORMAL HIGH (ref 7.350–7.450)
pO2, Arterial: 58 mmHg — ABNORMAL LOW (ref 83.0–108.0)

## 2019-10-11 LAB — COMPREHENSIVE METABOLIC PANEL
ALT: 17 U/L (ref 0–44)
AST: 21 U/L (ref 15–41)
Albumin: 2.7 g/dL — ABNORMAL LOW (ref 3.5–5.0)
Alkaline Phosphatase: 59 U/L (ref 38–126)
Anion gap: 11 (ref 5–15)
BUN: 64 mg/dL — ABNORMAL HIGH (ref 8–23)
CO2: 30 mmol/L (ref 22–32)
Calcium: 9.5 mg/dL (ref 8.9–10.3)
Chloride: 92 mmol/L — ABNORMAL LOW (ref 98–111)
Creatinine, Ser: 0.72 mg/dL (ref 0.44–1.00)
GFR calc Af Amer: >60 mL/min (ref >60)
GFR calc non Af Amer: >60 mL/min (ref >60)
Glucose, Bld: 229 mg/dL — ABNORMAL HIGH (ref 70–99)
Potassium: 4.1 mmol/L (ref 3.5–5.1)
Sodium: 133 mmol/L — ABNORMAL LOW (ref 135–145)
Total Bilirubin: 0.5 mg/dL (ref 0.3–1.2)
Total Protein: 6.6 g/dL (ref 6.5–8.1)

## 2019-10-11 LAB — GLUCOSE, CAPILLARY
Glucose-Capillary: 142 mg/dL — ABNORMAL HIGH (ref 70–99)
Glucose-Capillary: 231 mg/dL — ABNORMAL HIGH (ref 70–99)
Glucose-Capillary: 278 mg/dL — ABNORMAL HIGH (ref 70–99)
Glucose-Capillary: 349 mg/dL — ABNORMAL HIGH (ref 70–99)

## 2019-10-11 LAB — BRAIN NATRIURETIC PEPTIDE: B Natriuretic Peptide: 99.9 pg/mL (ref 0.0–100.0)

## 2019-10-11 LAB — C-REACTIVE PROTEIN: CRP: 1.1 mg/dL — ABNORMAL HIGH (ref <1.0)

## 2019-10-11 LAB — D-DIMER, QUANTITATIVE: D-Dimer, Quant: 1.64 ug/mL-FEU — ABNORMAL HIGH (ref 0.00–0.50)

## 2019-10-11 MED ORDER — TOCILIZUMAB 400 MG/20ML IV SOLN
664.0000 mg | Freq: Once | INTRAVENOUS | Status: AC
  Administered 2019-10-11: 664 mg via INTRAVENOUS
  Filled 2019-10-11: qty 33.2

## 2019-10-11 MED ORDER — IOHEXOL 350 MG/ML SOLN
75.0000 mL | Freq: Once | INTRAVENOUS | Status: AC | PRN
  Administered 2019-10-11: 75 mL via INTRAVENOUS

## 2019-10-11 MED ORDER — FUROSEMIDE 10 MG/ML IJ SOLN
20.0000 mg | Freq: Once | INTRAMUSCULAR | Status: AC
  Administered 2019-10-11: 20 mg via INTRAVENOUS
  Filled 2019-10-11: qty 2

## 2019-10-11 NOTE — Progress Notes (Addendum)
PROGRESS NOTE                                                                                                                                                                                                             Patient Demographics:    Christina Blackburn, is a 73 y.o. female, DOB - [birth year], AOZ:[id number]  Outpatient Primary MD for the patient is Sparks, Leonie Douglas, MD    LOS - 2  Admit date - 10/09/2019    CC - SOB     Brief Narrative  coronary artery disease from a peripheral arterial disease status post aortobiiliac stents, non-insulin-dependent type 2 diabetes and hypertension, who was diagnosed with COVID-19 on 09/29/2021 presents to the emergency room with a complaint of increasing shortness of breath, she was diagnosed with acute hypoxic respiratory failure admitted to [facility] and then subsequently transferred to [facility] for further care.   Subjective:   Patient in bed, appears comfortable, denies any headache, no fever, no chest pain or pressure, no shortness of breath at rest but some on ambulation, no abdominal pain. No focal weakness..   Assessment  & Plan :     1. Acute Hypoxic Resp. Failure due to Acute Covid 19 Viral Pneumonitis during the ongoing 2020 Covid 19 Pandemic - she has mild to moderate disease, continue on combination of steroids and remdesivir, her ABGs now do correlate with hypoxia and oxygen need, with steroid use her CRP has come down but I think she has clinical inflammation suggested by hypoxia, will try Actemra and monitor.  Also check CTA.  SpO2: (!) 89 % O2 Flow Rate (L/min): 13 L/min  Recent Labs  Lab 10/07/19 2320 10/08/19 0416 10/09/19 0336 10/10/19 0350 10/11/19 0340  CRP 8.9* 9.4* 6.3* 2.5* 1.1*  DDIMER  --   --   --  1.52* 1.64*  FERRITIN  --  225 231  --   --   BNP  --   --   --  50.5  --     Hepatic Function Latest Ref Rng & Units 10/11/2019 10/10/2019  10/09/2019  Total Protein 6.5 - 8.1 g/dL 6.6 6.7 6.9  Albumin 3.5 - 5.0 g/dL 2.7(L) 2.7(L) 2.7(L)  AST 15 - 41 U/L _0 ALT 0 - 44 U/L _1 Alk Phosphatase 38 - 126 U/L  59 61 48  Total Bilirubin 0.3 - 1.2 mg/dL 0.5 0.5 0.4    2.  History of CAD, PAD endarterectomy along with aortic and bilateral iliac stents, continue dual antiplatelet therapy along with statin for secondary prevention.  Outpatient vascular surgery and PCP follow-up for risk factor modification.  3.  Obesity.  BMI of 34.  Follow with PCP.  4.  Hypertension.  For now Norvasc.  5.  Non-insulin-dependent DM type II.  Have doubled Amaryl dose to improve glycemic control, sliding scale and Premeal NovoLog and monitor.  Stable outpatient control.  Lab Results  Component Value Date   HGBA1C 5.9 (H) 10/07/2019   CBG (last 3)  Recent Labs    10/10/19 1206 10/10/19 2041 10/11/19 0830  GLUCAP 372* 411* 142*     Condition - Fair  Family Communication  :  Daughter 12/25, 12/26, 12/27  Code Status :  Full  Diet :   Diet Order            Diet Carb Modified Fluid consistency: Thin; Room service appropriate? Yes  Diet effective now               Disposition Plan  :  Home  Consults  :  None  Procedures  :  None  PUD Prophylaxis : PPI  DVT Prophylaxis  :  Lovenox    Lab Results  Component Value Date   PLT 454 (H) 10/11/2019    Inpatient Medications  Scheduled Meds: . amLODipine  10 mg Oral Daily  . ascorbic acid  500 mg Oral Daily  . aspirin  81 mg Oral Daily  . atorvastatin  20 mg Oral Daily  . cholecalciferol  1,000 Units Oral Daily  . clopidogrel  75 mg Oral Daily  . DULoxetine  30 mg Oral Daily  . enoxaparin (LOVENOX) injection  40 mg Subcutaneous Q24H  . feeding supplement (ENSURE ENLIVE)  237 mL Oral TID BM  . fenofibrate  160 mg Oral Daily  . furosemide  20 mg Intravenous Once  . gabapentin  200 mg Oral TID  . glimepiride  4 mg Oral Q breakfast  . insulin aspart  0-15 Units  Subcutaneous TID WC  . insulin aspart  0-5 Units Subcutaneous QHS  . insulin aspart  9 Units Subcutaneous TID WC  . magnesium oxide  400 mg Oral Daily  . methylPREDNISolone (SOLU-MEDROL) injection  60 mg Intravenous Daily  . multivitamin with minerals  1 tablet Oral Daily  . omega-3 acid ethyl esters  1 g Oral BID  . pantoprazole  40 mg Oral Daily  . sodium chloride flush  3 mL Intravenous Q12H  . vitamin B-12  1,000 mcg Oral Daily  . zinc sulfate  220 mg Oral Daily   Continuous Infusions:  PRN Meds:.acetaminophen, albuterol, guaiFENesin-dextromethorphan, ondansetron **OR** ondansetron (ZOFRAN) IV, senna-docusate  Antibiotics  :    Anti-infectives (From admission, onward)   Start     Dose/Rate Route Frequency Ordered Stop   10/10/19 1000  remdesivir 100 mg in sodium chloride 0.9 % 100 mL IVPB  Status:  Discontinued     100 mg 200 mL/hr over 30 Minutes Intravenous Daily 10/09/19 1109 10/09/19 1137   10/09/19 1145  remdesivir 100 mg in sodium chloride 0.9 % 100 mL IVPB     100 mg 200 mL/hr over 30 Minutes Intravenous Daily 10/09/19 1137 10/11/19 0906   10/09/19 1115  remdesivir 200 mg in sodium chloride 0.9% 250 mL IVPB  Status:  Discontinued  200 mg 580 mL/hr over 30 Minutes Intravenous Once 10/09/19 1109 10/09/19 1137       Time Spent in minutes  30   Lala Lund M.D on 10/11/2019 at 11:09 AM  To page go to [email or web address] - password [facility]  [facility] -  Office  [phone]  See all Orders from today for further details    Objective:   Vitals:   10/11/19 0500 10/11/19 0600 10/11/19 0700 10/11/19 0800  BP: 118/70 112/77 127/80 119/70  Pulse: 80 85 88 88  Resp: 18 (!) 21 (!) 26 (!) 21  Temp:    97.8 F (36.6 C)  TempSrc:    Oral  SpO2: (!) 88% (!) 89% (!) 89% (!) 89%  Weight:      Height:        Wt Readings from Last 3 Encounters:  10/09/19 74.6 kg  10/07/19 77.1 kg  09/30/19 77.1 kg     Intake/Output Summary (Last 24 hours) at 10/11/2019  1109 Last data filed at 10/11/2019 1000 Gross per 24 hour  Intake 120 ml  Output 902 ml  Net -782 ml     Physical Exam  Awake Alert, No new F.N deficits, Normal affect [place].AT,PERRAL Supple Neck,No JVD, No cervical lymphadenopathy appriciated.  Symmetrical Chest wall movement, Good air movement bilaterally, few rales RRR,No Gallops, Rubs or new Murmurs, No Parasternal Heave +ve B.Sounds, Abd Soft, No tenderness, No organomegaly appriciated, No rebound - guarding or rigidity. No Cyanosis, Clubbing or edema, No new Rash or bruise    Data Review:    CBC Recent Labs  Lab 10/07/19 1734 10/08/19 0416 10/09/19 0336 10/10/19 0350 10/11/19 0340  WBC 3.9* 2.9* 5.3 5.2 8.8  HGB 11.9* 11.6* 11.5* 12.1 12.3  HCT 35.8* 33.3* 35.3* 37.5 37.9  PLT 317 265 386 413* 454*  MCV 83.4 80.4 83.6 85.8 85.6  MCH 27.7 28.0 27.3 27.7 27.8  MCHC 33.2 34.8 32.6 32.3 32.5  RDW 13.8 13.5 13.6 13.6 13.5  LYMPHSABS  --  0.5* 0.7 0.6* 0.9  MONOABS  --  0.1 0.3 0.2 0.3  EOSABS  --  0.0 0.0 0.0 0.0  BASOSABS  --  0.0 0.0 0.0 0.0    Chemistries  Recent Labs  Lab 10/07/19 1734 10/08/19 0416 10/09/19 0336 10/10/19 0350 10/11/19 0340  NA 132* 134* 132* 133* 133*  K 3.3* 3.9 4.6 4.9 4.1  CL 91* 94* 91* 91* 92*  CO2 _0 GLUCOSE 93 268* 228* 290* 229*  BUN 41* 34* 51* 61* 64*  CREATININE 0.78 0.62 0.65 0.71 0.72  CALCIUM 8.6* 8.7* 9.2 9.6 9.5  MG  --  1.7 2.0 1.9 1.7  AST  --  32 _1 ALT  --  _2 ALKPHOS  --  37* 48 61 59  BILITOT  --  0.9 0.4 0.5 0.5   ------------------------------------------------------------------------------------------------------------------ No results for input(s): CHOL, HDL, LDLCALC, TRIG, CHOLHDL, LDLDIRECT in the last 72 hours.  Lab Results  Component Value Date   HGBA1C 5.9 (H) 10/07/2019   ------------------------------------------------------------------------------------------------------------------ No results for input(s):  TSH, T4TOTAL, T3FREE, THYROIDAB in the last 72 hours.  Invalid input(s): FREET3  Cardiac Enzymes No results for input(s): CKMB, TROPONINI, MYOGLOBIN in the last 168 hours.  Invalid input(s): CK ------------------------------------------------------------------------------------------------------------------    Component Value Date/Time   BNP 50.5 10/10/2019 0350    Micro Results No results found for this or any previous visit (from the past 240 hour(s)).  Radiology  Reports DG Chest Port 1 View  Result Date: 10/11/2019 CLINICAL DATA:  Shortness of breath EXAM: PORTABLE CHEST 1 VIEW COMPARISON:  October 07, 2019 FINDINGS: The mediastinal contour and cardiac silhouette are stable. Heart size is enlarged. Patchy ground-glass opacities identified throughout both lungs slightly improved compared prior exam. There is no pleural effusion. Bony structures are stable. IMPRESSION: Patchy ground-glass opacities identified throughout both lungs slightly improved compared prior exam. This could be due to underlying pneumonia. Electronically Signed   By: Abelardo Diesel M.D.   On: 10/11/2019 09:09   DG Chest Portable 1 View  Result Date: 10/07/2019 CLINICAL DATA:  COVID positive EXAM: PORTABLE CHEST 1 VIEW COMPARISON:  February 09, 2016 FINDINGS: There is mild cardiomegaly. Extensive patchy/of the ground-glass opacities are seen throughout both lungs minimally within the mid to lower lungs. No pleural effusion is seen. No acute osseous abnormality. The patient is status post right total shoulder arthroplasty. IMPRESSION: Extensive bilateral ground-glass opacities, consistent with multifocal viral pneumonia. Electronically Signed   By: Prudencio Pair M.D.   On: 10/07/2019 19:01   MM 3D SCREEN BREAST BILATERAL  Result Date: 09/25/2019 CLINICAL DATA:  Screening. EXAM: DIGITAL SCREENING BILATERAL MAMMOGRAM WITH TOMO AND CAD COMPARISON:  Previous exam(s). ACR Breast Density Category b: There are scattered areas  of fibroglandular density. FINDINGS: In the left breast, a possible mass warrants further evaluation. This possible mass is seen within the upper inner quadrant of the LEFT breast, CC slice 22 and MLO slice 21, suspected fat necrosis. In the right breast, no findings suspicious for malignancy. Images were processed with CAD. IMPRESSION: Further evaluation is suggested for possible mass in the left breast. RECOMMENDATION: Diagnostic mammogram and possibly ultrasound of the left breast. (Code:[id number]-[id number]) The patient will be contacted regarding the findings, and additional imaging will be scheduled. BI-RADS CATEGORY  0: Incomplete. Need additional imaging evaluation and/or prior mammograms for comparison. Electronically Signed   By: Franki Cabot M.D.   On: 09/25/2019 11:16   ABI  Result Date: 09/28/2019 LOWER EXTREMITY DOPPLER STUDY Indications: Claudication, and ulceration. High Risk Factors: Hypertension.  Vascular Interventions: 08/05/2018: Abdominal Aortogram, Lt Lower Extremity                         distal Runoff. Comparison Study: 03/23/2019 Performing Technologist: Charlane Ferretti RT (R)(VS)  Examination Guidelines: A complete evaluation includes at minimum, Doppler waveform signals and systolic blood pressure reading at the level of bilateral brachial, anterior tibial, and posterior tibial arteries, when vessel segments are accessible. Bilateral testing is considered an integral part of a complete examination. Photoelectric Plethysmograph (PPG) waveforms and toe systolic pressure readings are included as required and additional duplex testing as needed. Limited examinations for reoccurring indications may be performed as noted.  ABI Findings: +---------+------------------+-----+----------+----------------+ Right    Rt Pressure (mmHg)IndexWaveform  Comment          +---------+------------------+-----+----------+----------------+ Brachial 209                                                +---------+------------------+-----+----------+----------------+ ATA      158               0.74 monophasic                 +---------+------------------+-----+----------+----------------+ PTA  monophasicNon compressable +---------+------------------+-----+----------+----------------+ Great Toe104               0.49 Abnormal                   +---------+------------------+-----+----------+----------------+ +---------+------------------+-----+----------+----------------+ Left     Lt Pressure (mmHg)IndexWaveform  Comment          +---------+------------------+-----+----------+----------------+ Brachial 213                                               +---------+------------------+-----+----------+----------------+ ATA      136               0.64 monophasic                 +---------+------------------+-----+----------+----------------+ PTA                             monophasicNon compressable +---------+------------------+-----+----------+----------------+ Great Toe49                0.23 Abnormal                   +---------+------------------+-----+----------+----------------+ +-------+-----------+-----------+------------+------------+ ABI/TBIToday's ABIToday's TBIPrevious ABIPrevious TBI +-------+-----------+-----------+------------+------------+ Right  .74        .49        [place]          .35          +-------+-----------+-----------+------------+------------+ Left   .64        .23                  .23          +-------+-----------+-----------+------------+------------+ Bilateral ABIs appear essentially unchanged compared to prior study on 03/23/2019. Bilateral TBIs appear essentially unchanged compared to prior study on 03/23/2019. Summary: Right: Resting right ankle-brachial index indicates moderate right lower extremity arterial disease. ABIs are unreliable. Right posterior tibial artery is non compressable suggesting  medial calcification. Left: Resting left ankle-brachial index indicates moderate left lower extremity arterial disease. ABIs are unreliable. Left posterior tibial artery is non compressable suggesting medial calcification.  *See table(s) above for measurements and observations.  Electronically signed by [name] MD on 09/28/2019 at 5:15:25 PM.    Final

## 2019-10-11 NOTE — Progress Notes (Signed)
ABG results given to Dr. Candiss Norse, No new orders at this time. RT will continue to monitor.

## 2019-10-12 ENCOUNTER — Ambulatory Visit: Payer: PPO

## 2019-10-12 ENCOUNTER — Other Ambulatory Visit: Payer: PPO

## 2019-10-12 LAB — CBC WITH DIFFERENTIAL/PLATELET
Abs Immature Granulocytes: 0.08 10*3/uL — ABNORMAL HIGH (ref 0.00–0.07)
Basophils Absolute: 0 10*3/uL (ref 0.0–0.1)
Basophils Relative: 1 %
Eosinophils Absolute: 0 10*3/uL (ref 0.0–0.5)
Eosinophils Relative: 0 %
HCT: 37.7 % (ref 36.0–46.0)
Hemoglobin: 12 g/dL (ref 12.0–15.0)
Immature Granulocytes: 1 %
Lymphocytes Relative: 15 %
Lymphs Abs: 0.9 10*3/uL (ref 0.7–4.0)
MCH: 27.2 pg (ref 26.0–34.0)
MCHC: 31.8 g/dL (ref 30.0–36.0)
MCV: 85.5 fL (ref 80.0–100.0)
Monocytes Absolute: 0.2 10*3/uL (ref 0.1–1.0)
Monocytes Relative: 4 %
Neutro Abs: 5 10*3/uL (ref 1.7–7.7)
Neutrophils Relative %: 79 %
Platelets: 437 10*3/uL — ABNORMAL HIGH (ref 150–400)
RBC: 4.41 MIL/uL (ref 3.87–5.11)
RDW: 13.7 % (ref 11.5–15.5)
WBC: 6.3 10*3/uL (ref 4.0–10.5)
nRBC: 0 % (ref 0.0–0.2)

## 2019-10-12 LAB — COMPREHENSIVE METABOLIC PANEL
ALT: 16 U/L (ref 0–44)
AST: 20 U/L (ref 15–41)
Albumin: 2.7 g/dL — ABNORMAL LOW (ref 3.5–5.0)
Alkaline Phosphatase: 54 U/L (ref 38–126)
Anion gap: 13 (ref 5–15)
BUN: 63 mg/dL — ABNORMAL HIGH (ref 8–23)
CO2: 31 mmol/L (ref 22–32)
Calcium: 9.2 mg/dL (ref 8.9–10.3)
Chloride: 92 mmol/L — ABNORMAL LOW (ref 98–111)
Creatinine, Ser: 0.77 mg/dL (ref 0.44–1.00)
GFR calc Af Amer: >60 mL/min (ref >60)
GFR calc non Af Amer: >60 mL/min (ref >60)
Glucose, Bld: 167 mg/dL — ABNORMAL HIGH (ref 70–99)
Potassium: 4.3 mmol/L (ref 3.5–5.1)
Sodium: 136 mmol/L (ref 135–145)
Total Bilirubin: 0.5 mg/dL (ref 0.3–1.2)
Total Protein: 6.6 g/dL (ref 6.5–8.1)

## 2019-10-12 LAB — BRAIN NATRIURETIC PEPTIDE: B Natriuretic Peptide: 29.1 pg/mL (ref 0.0–100.0)

## 2019-10-12 LAB — GLUCOSE, CAPILLARY
Glucose-Capillary: 476 mg/dL — ABNORMAL HIGH (ref 70–99)
Glucose-Capillary: 149 mg/dL — ABNORMAL HIGH (ref 70–99)
Glucose-Capillary: 322 mg/dL — ABNORMAL HIGH (ref 70–99)
Glucose-Capillary: 508 mg/dL (ref 70–99)
Glucose-Capillary: 265 mg/dL — ABNORMAL HIGH (ref 70–99)

## 2019-10-12 LAB — D-DIMER, QUANTITATIVE: D-Dimer, Quant: 1.66 ug/mL-FEU — ABNORMAL HIGH (ref 0.00–0.50)

## 2019-10-12 LAB — MAGNESIUM: Magnesium: 1.8 mg/dL (ref 1.7–2.4)

## 2019-10-12 LAB — C-REACTIVE PROTEIN: CRP: 2.5 mg/dL — ABNORMAL HIGH (ref <1.0)

## 2019-10-12 MED ORDER — INSULIN ASPART 100 UNIT/ML ~~LOC~~ SOLN
0.0000 [IU] | Freq: Three times a day (TID) | SUBCUTANEOUS | Status: DC
  Administered 2019-10-12: 3 [IU] via SUBCUTANEOUS
  Administered 2019-10-12: 12:00:00 15 [IU] via SUBCUTANEOUS
  Administered 2019-10-13: 4 [IU] via SUBCUTANEOUS
  Administered 2019-10-13: 11 [IU] via SUBCUTANEOUS
  Administered 2019-10-14: 4 [IU] via SUBCUTANEOUS
  Administered 2019-10-14: 7 [IU] via SUBCUTANEOUS
  Administered 2019-10-14: 15 [IU] via SUBCUTANEOUS
  Administered 2019-10-15: 4 [IU] via SUBCUTANEOUS
  Administered 2019-10-15: 7 [IU] via SUBCUTANEOUS
  Administered 2019-10-16: 15 [IU] via SUBCUTANEOUS
  Administered 2019-10-16 – 2019-10-17 (×2): 3 [IU] via SUBCUTANEOUS

## 2019-10-12 MED ORDER — INSULIN ASPART 100 UNIT/ML ~~LOC~~ SOLN
0.0000 [IU] | Freq: Every day | SUBCUTANEOUS | Status: DC
  Administered 2019-10-12: 3 [IU] via SUBCUTANEOUS
  Administered 2019-10-14: 5 [IU] via SUBCUTANEOUS
  Administered 2019-10-15: 3 [IU] via SUBCUTANEOUS
  Administered 2019-10-16: 5 [IU] via SUBCUTANEOUS

## 2019-10-12 MED ORDER — GLUCERNA SHAKE PO LIQD
237.0000 mL | Freq: Three times a day (TID) | ORAL | Status: DC
  Administered 2019-10-12: 237 mL via ORAL

## 2019-10-12 MED ORDER — INSULIN ASPART 100 UNIT/ML ~~LOC~~ SOLN
40.0000 [IU] | Freq: Once | SUBCUTANEOUS | Status: AC
  Administered 2019-10-12: 40 [IU] via SUBCUTANEOUS

## 2019-10-12 NOTE — Progress Notes (Signed)
Spoke with patient's daughter and updated her on patient's status.  All questions answered and concerns addressed.

## 2019-10-12 NOTE — Progress Notes (Addendum)
1337: Per dietician, discontinue Ensure shakes and add Glucerna shakes.  1550: Patients afternoon blood sugar 508. MD Candiss Norse notified. Ordered to stop all supplemental shakes and give 40 units of Aspart.  1640: Patient resting comfortably in chair. O2 titrated to 10L HFNC. Patient currently working on flutter valve and incentive. Per MD titrate for O2 goal of 92%  1815: Daughter updated on decreased in O2. All questions answered at this time.

## 2019-10-12 NOTE — Plan of Care (Signed)
  Problem: Education: Goal: Knowledge of risk factors and measures for prevention of condition will improve Outcome: Progressing   Problem: Coping: Goal: Psychosocial and spiritual needs will be supported Outcome: Progressing   Problem: Respiratory: Goal: Will maintain a patent airway Outcome: Progressing   

## 2019-10-12 NOTE — Progress Notes (Signed)
PROGRESS NOTE                                                                                                                                                                                                             Patient Demographics:    Christina Blackburn, is a 73 y.o. female, DOB - [birth year], IOE:[id number]  Outpatient Primary MD for the patient is Sparks, Leonie Douglas, MD    LOS - 3  Admit date - 10/09/2019    CC - SOB     Brief Narrative  coronary artery disease from a peripheral arterial disease status post aortobiiliac stents, non-insulin-dependent type 2 diabetes and hypertension, who was diagnosed with COVID-19 on 09/29/2021 presents to the emergency room with a complaint of increasing shortness of breath, she was diagnosed with acute hypoxic respiratory failure admitted to [facility] and then subsequently transferred to [facility] for further care.   Subjective:   Patient taken recliner in no distress, denies any headache chest or abdominal pain, no shortness of breath at rest, upon ambulation does get little short winded.  No focal weakness.   Assessment  & Plan :     1. Acute Hypoxic Resp. Failure due to Acute Covid 19 Viral Pneumonitis during the ongoing 2020 Covid 19 Pandemic - she had progressive COVID-19 pneumonia, unfortunately her pulse ox is highly variable, ABG correlated hypoxia hence was given Actemra on 10/11/2019, continue IV steroids and remdesivir, she and daughter had consented for Actemra use.  Encouraged to sit up in chair and daytime use flutter valve and I-S for pulmonary toiletry and prone in bed at night.  We will try to advance activity and titrate down oxygen as tolerated.  SpO2: (!) 89 % O2 Flow Rate (L/min): 15 L/min  Recent Labs  Lab 10/08/19 0416 10/09/19 0336 10/10/19 0350 10/11/19 0340 10/12/19 0452  CRP 9.4* 6.3* 2.5* 1.1* 2.5*  DDIMER  --   --  1.52* 1.64* 1.66*  FERRITIN 225  231  --   --   --   BNP  --   --  50.5 99.9 29.1    Hepatic Function Latest Ref Rng & Units 10/12/2019 10/11/2019 10/10/2019  Total Protein 6.5 - 8.1 g/dL 6.6 6.6 6.7  Albumin 3.5 - 5.0 g/dL 2.7(L) 2.7(L) 2.7(L)  AST 15 - 41 U/L 20 21 24   ALT 0 -  44 U/L 16 17 16   Alk Phosphatase 38 - 126 U/L 54 59 61  Total Bilirubin 0.3 - 1.2 mg/dL 0.5 0.5 0.5    2.  History of CAD, PAD endarterectomy along with aortic and bilateral iliac stents, continue dual antiplatelet therapy along with statin for secondary prevention.  Outpatient vascular surgery and PCP follow-up for risk factor modification.  3.  Obesity.  BMI of 34.  Follow with PCP.  4.  Hypertension.  For now Norvasc.  5.  Incidental CT finding of thyroid nodule and left subclavian stenosis.  Follow with PCP for age-appropriate follow-up.    6. Non-insulin-dependent DM type II.  Have doubled Amaryl dose to improve glycemic control, sliding scale and Premeal NovoLog and monitor.  Stable outpatient control.  Lab Results  Component Value Date   HGBA1C 5.9 (H) 10/07/2019   CBG (last 3)  Recent Labs    10/11/19 1705 10/11/19 2104 10/12/19 0749  GLUCAP 278* 349* 149*     Condition - Fair  Family Communication  :  Daughter 12/25, 12/26, 12/27  Code Status :  Full  Diet :   Diet Order            Diet Carb Modified Fluid consistency: Thin; Room service appropriate? Yes  Diet effective now               Disposition Plan  :  Home  Consults  :  None  Procedures  :    CTA  - no PE, bilateral groundglass opacities, left subclavian stenosis, thyroid nodule.  PUD Prophylaxis : PPI  DVT Prophylaxis  :  Lovenox    Lab Results  Component Value Date   PLT 437 (H) 10/12/2019    Inpatient Medications  Scheduled Meds: . amLODipine  10 mg Oral Daily  . ascorbic acid  500 mg Oral Daily  . aspirin  81 mg Oral Daily  . atorvastatin  20 mg Oral Daily  . cholecalciferol  1,000 Units Oral Daily  . clopidogrel  75 mg Oral  Daily  . DULoxetine  30 mg Oral Daily  . enoxaparin (LOVENOX) injection  40 mg Subcutaneous Q24H  . feeding supplement (ENSURE ENLIVE)  237 mL Oral TID BM  . fenofibrate  160 mg Oral Daily  . gabapentin  200 mg Oral TID  . glimepiride  4 mg Oral Q breakfast  . insulin aspart  0-20 Units Subcutaneous TID WC  . insulin aspart  0-5 Units Subcutaneous QHS  . insulin aspart  9 Units Subcutaneous TID WC  . magnesium oxide  400 mg Oral Daily  . methylPREDNISolone (SOLU-MEDROL) injection  60 mg Intravenous Daily  . multivitamin with minerals  1 tablet Oral Daily  . omega-3 acid ethyl esters  1 g Oral BID  . pantoprazole  40 mg Oral Daily  . sodium chloride flush  3 mL Intravenous Q12H  . vitamin B-12  1,000 mcg Oral Daily  . zinc sulfate  220 mg Oral Daily   Continuous Infusions:  PRN Meds:.acetaminophen, albuterol, guaiFENesin-dextromethorphan, ondansetron **OR** ondansetron (ZOFRAN) IV, senna-docusate  Antibiotics  :    Anti-infectives (From admission, onward)   Start     Dose/Rate Route Frequency Ordered Stop   10/10/19 1000  remdesivir 100 mg in sodium chloride 0.9 % 100 mL IVPB  Status:  Discontinued     100 mg 200 mL/hr over 30 Minutes Intravenous Daily 10/09/19 1109 10/09/19 1137   10/09/19 1145  remdesivir 100 mg in sodium chloride 0.9 % 100  mL IVPB     100 mg 200 mL/hr over 30 Minutes Intravenous Daily 10/09/19 1137 10/11/19 0906   10/09/19 1115  remdesivir 200 mg in sodium chloride 0.9% 250 mL IVPB  Status:  Discontinued     200 mg 580 mL/hr over 30 Minutes Intravenous Once 10/09/19 1109 10/09/19 1137       Time Spent in minutes  30   Lala Lund M.D on 10/12/2019 at 10:55 AM  To page go to [email or web address] - password [facility]  [facility] -  Office  [phone]  See all Orders from today for further details    Objective:   Vitals:   10/12/19 0500 10/12/19 0600 10/12/19 0700 10/12/19 0800  BP: 124/69 136/78 (!) 144/71 (!) 141/78  Pulse: 82 81 96 99    Resp: 19 20 (!) 21 20  Temp:      TempSrc:      SpO2: (!) 87% 91% (!) 89% (!) 89%  Weight:      Height:        Wt Readings from Last 3 Encounters:  10/09/19 74.6 kg  10/07/19 77.1 kg  09/30/19 77.1 kg     Intake/Output Summary (Last 24 hours) at 10/12/2019 1055 Last data filed at 10/12/2019 0900 Gross per 24 hour  Intake 700.03 ml  Output 1210 ml  Net -509.97 ml     Physical Exam  Awake Alert, Oriented X 3, No new F.N deficits, Normal affect [place].AT,PERRAL Supple Neck,No JVD, No cervical lymphadenopathy appriciated.  Symmetrical Chest wall movement, Good air movement bilaterally, few rales RRR,No Gallops, Rubs or new Murmurs, No Parasternal Heave +ve B.Sounds, Abd Soft, No tenderness, No organomegaly appriciated, No rebound - guarding or rigidity. No Cyanosis, Clubbing or edema, No new Rash or bruise     Data Review:    CBC Recent Labs  Lab 10/08/19 0416 10/09/19 0336 10/10/19 0350 10/11/19 0340 10/11/19 1056 10/12/19 0452  WBC 2.9* 5.3 5.2 8.8  --  6.3  HGB 11.6* 11.5* 12.1 12.3 11.9* 12.0  HCT 33.3* 35.3* 37.5 37.9 35.0* 37.7  PLT 265 386 413* 454*  --  437*  MCV 80.4 83.6 85.8 85.6  --  85.5  MCH 28.0 27.3 27.7 27.8  --  27.2  MCHC 34.8 32.6 32.3 32.5  --  31.8  RDW 13.5 13.6 13.6 13.5  --  13.7  LYMPHSABS 0.5* 0.7 0.6* 0.9  --  0.9  MONOABS 0.1 0.3 0.2 0.3  --  0.2  EOSABS 0.0 0.0 0.0 0.0  --  0.0  BASOSABS 0.0 0.0 0.0 0.0  --  0.0    Chemistries  Recent Labs  Lab 10/08/19 0416 10/09/19 0336 10/10/19 0350 10/11/19 0340 10/11/19 1056 10/12/19 0452  NA 134* 132* 133* 133* 131* 136  K 3.9 4.6 4.9 4.1 4.8 4.3  CL 94* 91* 91* 92*  --  92*  CO2 23 28 27 30   --  31  GLUCOSE 268* 228* 290* 229*  --  167*  BUN 34* 51* 61* 64*  --  63*  CREATININE 0.62 0.65 0.71 0.72  --  0.77  CALCIUM 8.7* 9.2 9.6 9.5  --  9.2  MG 1.7 2.0 1.9 1.7  --  1.8  AST 32 28 24 21   --  20  ALT 14 14 16 17   --  16  ALKPHOS 37* 48 61 59  --  54  BILITOT 0.9 0.4 0.5  0.5  --  0.5   ------------------------------------------------------------------------------------------------------------------ No results for input(s): CHOL,  HDL, LDLCALC, TRIG, CHOLHDL, LDLDIRECT in the last 72 hours.  Lab Results  Component Value Date   HGBA1C 5.9 (H) 10/07/2019   ------------------------------------------------------------------------------------------------------------------ No results for input(s): TSH, T4TOTAL, T3FREE, THYROIDAB in the last 72 hours.  Invalid input(s): FREET3  Cardiac Enzymes No results for input(s): CKMB, TROPONINI, MYOGLOBIN in the last 168 hours.  Invalid input(s): CK ------------------------------------------------------------------------------------------------------------------    Component Value Date/Time   BNP 29.1 10/12/2019 0452    Micro Results No results found for this or any previous visit (from the past 240 hour(s)).  Radiology Reports CT ANGIO CHEST PE W OR WO CONTRAST  Result Date: 10/11/2019 CLINICAL DATA:  73 year old with COVID-19 pneumonia, with acute worsening of shortness of breath. EXAM: CT ANGIOGRAPHY CHEST WITH CONTRAST TECHNIQUE: Multidetector CT imaging of the chest was performed using the standard protocol during bolus administration of intravenous contrast. Multiplanar CT image reconstructions and MIPs were obtained to evaluate the vascular anatomy. CONTRAST:  92m OMNIPAQUE IOHEXOL 350 MG/ML IV. COMPARISON:  No prior CT chest. FINDINGS: Cardiovascular: Contrast opacification of pulmonary arteries is good. No filling defects within either main pulmonary artery or their segmental branches in either lung to suggest pulmonary embolism. Heart size upper normal to slightly enlarged. Severe three-vessel coronary atherosclerosis. Dense mitral annular calcification no pericardial effusion. Prominent epicardial fat. Severe atherosclerosis involving the thoracic and proximal abdominal aorta without evidence of aneurysm.  Severe atherosclerosis at the origin of the LEFT subclavian artery with calcified plaque and possible hemodynamically significant stenosis. Severe atherosclerosis at the origins of the celiac and SMA with possible borderline hemodynamically significant stenoses. Mediastinum/Nodes: No pathologically enlarged mediastinal, hilar or axillary lymph nodes. No mediastinal masses. Normal-appearing esophagus. Asymmetric enlargement of the RIGHT lobe of the thyroid gland with multiple nodules, the largest approximating 1.3 cm. Lungs/Pleura: Ground-glass airspace opacities diffusely throughout both lungs, confirming the given diagnosis of COVID-19 viral pneumonia. No confluent airspace opacities in either lung. No parenchymal nodules or masses. No pleural effusions. Central airways patent without significant bronchial wall thickening. Upper Abdomen: Surgically absent gallbladder. Mild enlargement of the LEFT adrenal gland without nodularity. Visualized upper abdomen otherwise unremarkable for the early arterial phase of enhancement which accounts for the heterogeneous appearance of the spleen. Musculoskeletal: Prior RIGHT shoulder arthroplasty with anatomic alignment. Severe degenerative disc disease and spondylosis throughout the thoracic spine. No acute findings. Review of the MIP images confirms the above findings. IMPRESSION: 1. No evidence of pulmonary embolism. 2. Ground-glass airspace opacities diffusely throughout both lungs, confirming the given diagnosis of COVID-19 viral pneumonia. 3. Asymmetric enlargement of the RIGHT lobe of the thyroid gland with multiple nodules, the largest approximating 1.3 cm. No imaging followup is needed.(Ref: J Am Coll Radiol. 2015 Feb;12(2): 143-50). 4. Severe three-vessel coronary atherosclerosis. 5. Possible hemodynamically significant stenosis involving the LEFT subclavian artery. Does the patient have symptoms related to LEFT UPPER EXTREMITY claudication? Aortic Atherosclerosis  (ICD10-I70.0). Electronically Signed   By: TEvangeline DakinM.D.   On: 10/11/2019 12:40   DG Chest Port 1 View  Result Date: 10/11/2019 CLINICAL DATA:  Shortness of breath EXAM: PORTABLE CHEST 1 VIEW COMPARISON:  October 07, 2019 FINDINGS: The mediastinal contour and cardiac silhouette are stable. Heart size is enlarged. Patchy ground-glass opacities identified throughout both lungs slightly improved compared prior exam. There is no pleural effusion. Bony structures are stable. IMPRESSION: Patchy ground-glass opacities identified throughout both lungs slightly improved compared prior exam. This could be due to underlying pneumonia. Electronically Signed   By: WAbelardo DieselM.D.   On:  10/11/2019 09:09   DG Chest Portable 1 View  Result Date: 10/07/2019 CLINICAL DATA:  COVID positive EXAM: PORTABLE CHEST 1 VIEW COMPARISON:  February 09, 2016 FINDINGS: There is mild cardiomegaly. Extensive patchy/of the ground-glass opacities are seen throughout both lungs minimally within the mid to lower lungs. No pleural effusion is seen. No acute osseous abnormality. The patient is status post right total shoulder arthroplasty. IMPRESSION: Extensive bilateral ground-glass opacities, consistent with multifocal viral pneumonia. Electronically Signed   By: Prudencio Pair M.D.   On: 10/07/2019 19:01   MM 3D SCREEN BREAST BILATERAL  Result Date: 09/25/2019 CLINICAL DATA:  Screening. EXAM: DIGITAL SCREENING BILATERAL MAMMOGRAM WITH TOMO AND CAD COMPARISON:  Previous exam(s). ACR Breast Density Category b: There are scattered areas of fibroglandular density. FINDINGS: In the left breast, a possible mass warrants further evaluation. This possible mass is seen within the upper inner quadrant of the LEFT breast, CC slice 22 and MLO slice 21, suspected fat necrosis. In the right breast, no findings suspicious for malignancy. Images were processed with CAD. IMPRESSION: Further evaluation is suggested for possible mass in the left  breast. RECOMMENDATION: Diagnostic mammogram and possibly ultrasound of the left breast. (Code:[id number]) The patient will be contacted regarding the findings, and additional imaging will be scheduled. BI-RADS CATEGORY  0: Incomplete. Need additional imaging evaluation and/or prior mammograms for comparison. Electronically Signed   By: Franki Cabot M.D.   On: 09/25/2019 11:16   ABI  Result Date: 09/28/2019 LOWER EXTREMITY DOPPLER STUDY Indications: Claudication, and ulceration. High Risk Factors: Hypertension.  Vascular Interventions: 08/05/2018: Abdominal Aortogram, Lt Lower Extremity                         distal [place]. Comparison Study: 03/23/2019 Performing Technologist: Charlane Ferretti RT (R)(VS)  Examination Guidelines: A complete evaluation includes at minimum, Doppler waveform signals and systolic blood pressure reading at the level of bilateral brachial, anterior tibial, and posterior tibial arteries, when vessel segments are accessible. Bilateral testing is considered an integral part of a complete examination. Photoelectric Plethysmograph (PPG) waveforms and toe systolic pressure readings are included as required and additional duplex testing as needed. Limited examinations for reoccurring indications may be performed as noted.  ABI Findings: +---------+------------------+-----+----------+----------------+ Right    Rt Pressure (mmHg)IndexWaveform  Comment          +---------+------------------+-----+----------+----------------+ Brachial 209                                               +---------+------------------+-----+----------+----------------+ ATA      158               0.74 monophasic                 +---------+------------------+-----+----------+----------------+ PTA                             monophasicNon compressable +---------+------------------+-----+----------+----------------+ Great Toe104               0.49 Abnormal                    +---------+------------------+-----+----------+----------------+ +---------+------------------+-----+----------+----------------+ Left     Lt Pressure (mmHg)IndexWaveform  Comment          +---------+------------------+-----+----------+----------------+ Brachial 213                                               +---------+------------------+-----+----------+----------------+  ATA      136               0.64 monophasic                 +---------+------------------+-----+----------+----------------+ PTA                             monophasicNon compressable +---------+------------------+-----+----------+----------------+ Great Toe49                0.23 Abnormal                   +---------+------------------+-----+----------+----------------+ +-------+-----------+-----------+------------+------------+ ABI/TBIToday's ABIToday's TBIPrevious ABIPrevious TBI +-------+-----------+-----------+------------+------------+ Right  .74        .66        [place]          .35          +-------+-----------+-----------+------------+------------+ Left   .64        .23        [place]          .23          +-------+-----------+-----------+------------+------------+ Bilateral ABIs appear essentially unchanged compared to prior study on 03/23/2019. Bilateral TBIs appear essentially unchanged compared to prior study on 03/23/2019. Summary: Right: Resting right ankle-brachial index indicates moderate right lower extremity arterial disease. ABIs are unreliable. Right posterior tibial artery is non compressable suggesting medial calcification. Left: Resting left ankle-brachial index indicates moderate left lower extremity arterial disease. ABIs are unreliable. Left posterior tibial artery is non compressable suggesting medial calcification.  *See table(s) above for measurements and observations.  Electronically signed by [name] MD on 09/28/2019 at 5:15:25 PM.    Final

## 2019-10-13 LAB — BRAIN NATRIURETIC PEPTIDE: B Natriuretic Peptide: 50 pg/mL (ref 0.0–100.0)

## 2019-10-13 LAB — CBC WITH DIFFERENTIAL/PLATELET
Abs Immature Granulocytes: 0.11 10*3/uL — ABNORMAL HIGH (ref 0.00–0.07)
Basophils Absolute: 0 10*3/uL (ref 0.0–0.1)
Basophils Relative: 0 %
Eosinophils Absolute: 0 10*3/uL (ref 0.0–0.5)
Eosinophils Relative: 0 %
HCT: 38.4 % (ref 36.0–46.0)
Hemoglobin: 12.4 g/dL (ref 12.0–15.0)
Immature Granulocytes: 1 %
Lymphocytes Relative: 10 %
Lymphs Abs: 0.8 10*3/uL (ref 0.7–4.0)
MCH: 27.6 pg (ref 26.0–34.0)
MCHC: 32.3 g/dL (ref 30.0–36.0)
MCV: 85.5 fL (ref 80.0–100.0)
Monocytes Absolute: 0.3 10*3/uL (ref 0.1–1.0)
Monocytes Relative: 4 %
Neutro Abs: 6.5 10*3/uL (ref 1.7–7.7)
Neutrophils Relative %: 85 %
Platelets: 428 10*3/uL — ABNORMAL HIGH (ref 150–400)
RBC: 4.49 MIL/uL (ref 3.87–5.11)
RDW: 13.5 % (ref 11.5–15.5)
WBC: 7.7 10*3/uL (ref 4.0–10.5)
nRBC: 0.3 % — ABNORMAL HIGH (ref 0.0–0.2)

## 2019-10-13 LAB — GLUCOSE, CAPILLARY
Glucose-Capillary: 174 mg/dL — ABNORMAL HIGH (ref 70–99)
Glucose-Capillary: 286 mg/dL — ABNORMAL HIGH (ref 70–99)
Glucose-Capillary: 295 mg/dL — ABNORMAL HIGH (ref 70–99)
Glucose-Capillary: 382 mg/dL — ABNORMAL HIGH (ref 70–99)
Glucose-Capillary: 425 mg/dL — ABNORMAL HIGH (ref 70–99)

## 2019-10-13 LAB — COMPREHENSIVE METABOLIC PANEL
ALT: 19 U/L (ref 0–44)
AST: 22 U/L (ref 15–41)
Albumin: 2.9 g/dL — ABNORMAL LOW (ref 3.5–5.0)
Alkaline Phosphatase: 51 U/L (ref 38–126)
Anion gap: 10 (ref 5–15)
BUN: 60 mg/dL — ABNORMAL HIGH (ref 8–23)
CO2: 32 mmol/L (ref 22–32)
Calcium: 9.2 mg/dL (ref 8.9–10.3)
Chloride: 94 mmol/L — ABNORMAL LOW (ref 98–111)
Creatinine, Ser: 0.74 mg/dL (ref 0.44–1.00)
GFR calc Af Amer: >60 mL/min (ref >60)
GFR calc non Af Amer: >60 mL/min (ref >60)
Glucose, Bld: 171 mg/dL — ABNORMAL HIGH (ref 70–99)
Potassium: 4.8 mmol/L (ref 3.5–5.1)
Sodium: 136 mmol/L (ref 135–145)
Total Bilirubin: 1 mg/dL (ref 0.3–1.2)
Total Protein: 6.4 g/dL — ABNORMAL LOW (ref 6.5–8.1)

## 2019-10-13 LAB — MAGNESIUM: Magnesium: 2 mg/dL (ref 1.7–2.4)

## 2019-10-13 LAB — D-DIMER, QUANTITATIVE: D-Dimer, Quant: 1.82 ug/mL-FEU — ABNORMAL HIGH (ref 0.00–0.50)

## 2019-10-13 LAB — C-REACTIVE PROTEIN: CRP: 1.1 mg/dL — ABNORMAL HIGH (ref <1.0)

## 2019-10-13 MED ORDER — METHYLPREDNISOLONE SODIUM SUCC 40 MG IJ SOLR
40.0000 mg | Freq: Every day | INTRAMUSCULAR | Status: DC
  Administered 2019-10-14: 40 mg via INTRAVENOUS
  Filled 2019-10-13: qty 1

## 2019-10-13 MED ORDER — INSULIN ASPART 100 UNIT/ML ~~LOC~~ SOLN
30.0000 [IU] | Freq: Once | SUBCUTANEOUS | Status: AC
  Administered 2019-10-13: 30 [IU] via SUBCUTANEOUS

## 2019-10-13 MED ORDER — INSULIN ASPART 100 UNIT/ML ~~LOC~~ SOLN
10.0000 [IU] | Freq: Once | SUBCUTANEOUS | Status: AC
  Administered 2019-10-13: 10 [IU] via SUBCUTANEOUS

## 2019-10-13 MED ORDER — INSULIN DETEMIR 100 UNIT/ML ~~LOC~~ SOLN
15.0000 [IU] | Freq: Every day | SUBCUTANEOUS | Status: DC
  Administered 2019-10-13 – 2019-10-16 (×4): 15 [IU] via SUBCUTANEOUS
  Filled 2019-10-13 (×5): qty 0.15

## 2019-10-13 MED ORDER — INSULIN ASPART 100 UNIT/ML ~~LOC~~ SOLN
6.0000 [IU] | Freq: Three times a day (TID) | SUBCUTANEOUS | Status: DC
  Administered 2019-10-13 – 2019-10-14 (×4): 6 [IU] via SUBCUTANEOUS

## 2019-10-13 NOTE — Progress Notes (Signed)
Physical Therapy Treatment Patient Details Name: Christina Blackburn MRN: [id number] DOB: 17-Sep-1946 Today's Date: 10/13/2019    History of Present Illness 73 y/o female pt coronary artery disease from a peripheral arterial disease status post aortobiiliac stents, non-insulin-dependent type 2 diabetes and hypertension, who was diagnosed with COVID-19 on 09/29/2021 presents to the emergency room with a complaint of increasing shortness of breath, she was diagnosed with acute hypoxic respiratory failure admitted to [facility] and then subsequently transferred to [facility] for further care.    PT Comments    Pt is making progress with mobility, states has been ambulating in room. Pt able to ambulate approx 10ft with RW and SBA on 4L/min via [place]. Monitor states desat to mid 70s but pt does not apper to be in distress and is able to recover 02 sats to 90 within 2 mins. Completed seated there ex and also breathing exercises with cues and pt did well with all.     Follow Up Recommendations  Home health PT     Equipment Recommendations  Rolling walker with 5" wheels    Recommendations for Other Services       Precautions / Restrictions Precautions Precautions: Fall Restrictions Weight Bearing Restrictions: No    Mobility  Bed Mobility               General bed mobility comments: pt sitting in recliner at therapist arrival to room  Transfers Overall transfer level: Needs assistance Equipment used: Rolling walker (2 wheeled) Transfers: Sit to/from Stand Sit to Stand: Supervision            Ambulation/Gait Ambulation/Gait assistance: Supervision Gait Distance (Feet): 60 Feet Assistive device: Rolling walker (2 wheeled) Gait Pattern/deviations: Step-through pattern(small steps)     General Gait Details: ambulated in room on 4L/min via [place], monitor reads desat to mid 70s, takes 2 mins to recover to 90%   Stairs             Wheelchair Mobility    Modified  [place] (Stroke Patients Only)       Balance                                            Cognition Arousal/Alertness: Awake/alert Behavior During Therapy: WFL for tasks assessed/performed Overall Cognitive Status: Within Functional Limits for tasks assessed                                        Exercises General Exercises - Lower Extremity Long Arc Quad: 10 reps;Strengthening;Both Hip Flexion/Marching: 10 reps;Strengthening;Both Toe Raises: 10 reps;Strengthening;Both Heel Raises: 10 reps;Strengthening;Both Other Exercises Other Exercises: flutter valve use x 10 Other Exercises: incentive spirometer x 5    General Comments        Pertinent Vitals/Pain Pain Assessment: No/denies pain    Home Living                      Prior Function            PT Goals (current goals can now be found in the care plan section) Acute Rehab PT Goals Patient Stated Goal: to go home PT Goal Formulation: With patient Time For Goal Achievement: 10/24/19 Potential to Achieve Goals: Good Progress towards PT goals: Progressing toward goals  Frequency    Min 3X/week      PT Plan Current plan remains appropriate    Co-evaluation              AM-PAC PT "6 Clicks" Mobility   Outcome Measure  Help needed turning from your back to your side while in a flat bed without using bedrails?: A Little Help needed moving from lying on your back to sitting on the side of a flat bed without using bedrails?: A Little Help needed moving to and from a bed to a chair (including a wheelchair)?: A Little Help needed standing up from a chair using your arms (e.g., wheelchair or bedside chair)?: A Little Help needed to walk in hospital room?: A Little Help needed climbing 3-5 steps with a railing? : A Lot 6 Click Score: 17    End of Session Equipment Utilized During Treatment: Oxygen Activity Tolerance: Patient tolerated treatment well Patient  left: in chair;with call bell/phone within reach   PT Visit Diagnosis: Other abnormalities of gait and mobility (R26.89);Muscle weakness (generalized) (M62.81)     Time: [id number] PT Time Calculation (min) (ACUTE ONLY): 18 min  Charges:  $Gait Training: 8-22 mins                     Horald Chestnut, PT    Delford Field 10/13/2019, 4:17 PM

## 2019-10-13 NOTE — Progress Notes (Signed)
Daughter called she is updated and all questions answered

## 2019-10-13 NOTE — Progress Notes (Signed)
PROGRESS NOTE                                                                                                                                                                                                             Patient Demographics:    Christina Blackburn, is a 73 y.o. female, DOB - 04-Sep-1946, QMG:[id number]  Outpatient Primary MD for the patient is Sparks, Leonie Douglas, MD    LOS - 4  Admit date - 10/09/2019    CC - SOB     Brief Narrative  coronary artery disease from a peripheral arterial disease status post aortobiiliac stents, non-insulin-dependent type 2 diabetes and hypertension, who was diagnosed with COVID-19 on 09/29/2021 presents to the emergency room with a complaint of increasing shortness of breath, she was diagnosed with acute hypoxic respiratory failure admitted to [facility] and then subsequently transferred to [facility] for further care.   Subjective:   Patient in recliner, appears comfortable, denies any headache, no fever, no chest pain or pressure, no shortness of breath , no abdominal pain. No focal weakness.    Assessment  & Plan :     1. Acute Hypoxic Resp. Failure due to Acute Covid 19 Viral Pneumonitis during the ongoing 2020 Covid 19 Pandemic - she had progressive COVID-19 pneumonia, she was started on IV steroids and remdesivir but continued to get hypoxic hence Actemra was used on 10/12/2019 with remarkable improvement, oxygen demands have come down from 15 L nasal cannula to 3 L nasal cannula within 24 hours.  Continue to taper steroids gradually and finished remdesivir course, she has been encouraged to sit up in chair and daytime use flutter valve and I-S for pulmonary toiletry and prone in bed at night.  We will try to advance activity and titrate down oxygen as tolerated.  SpO2: 91 % O2 Flow Rate (L/min): 3 L/min  Recent Labs  Lab 10/08/19 0416 10/09/19 0336 10/10/19 0350 10/11/19  0340 10/12/19 0452 10/13/19 0225 10/13/19 0250  CRP 9.4* 6.3* 2.5* 1.1* 2.5* 1.1*  --   DDIMER  --   --  1.52* 1.64* 1.66*  --  1.82*  FERRITIN 225 231  --   --   --   --   --   BNP  --   --  50.5 99.9 29.1 50.0  --  Hepatic Function Latest Ref Rng & Units 10/13/2019 10/12/2019 10/11/2019  Total Protein 6.5 - 8.1 g/dL 6.4(L) 6.6 6.6  Albumin 3.5 - 5.0 g/dL 2.9(L) 2.7(L) 2.7(L)  AST 15 - 41 U/L _0 ALT 0 - 44 U/L _1 Alk Phosphatase 38 - 126 U/L 51 54 59  Total Bilirubin 0.3 - 1.2 mg/dL 1.0 0.5 0.5    2.  History of CAD, PAD endarterectomy along with aortic and bilateral iliac stents, continue dual antiplatelet therapy along with statin for secondary prevention.  Outpatient vascular surgery and PCP follow-up for risk factor modification.  3.  Obesity.  BMI of 34.  Follow with PCP.  4.  Hypertension.  For now Norvasc.  5.  Incidental CT finding of thyroid nodule and left subclavian stenosis.  Follow with PCP for age-appropriate follow-up.    6. Non-insulin-dependent DM type II.  Have doubled Amaryl dose to improve glycemic control, sliding scale and Premeal NovoLog and monitor.  Stable outpatient control.  Lab Results  Component Value Date   HGBA1C 5.9 (H) 10/07/2019   CBG (last 3)  Recent Labs    10/12/19 1548 10/12/19 2004 10/13/19 0720  GLUCAP 508* 265* 174*     Condition - Fair  Family Communication  :  Daughter 12/25, 12/26, 12/27, 12/29  Code Status :  Full  Diet :   Diet Order            Diet Carb Modified Fluid consistency: Thin; Room service appropriate? Yes  Diet effective now               Disposition Plan  :  Home  Consults  :  None  Procedures  :    CTA  - no PE, bilateral groundglass opacities, left subclavian stenosis, thyroid nodule.  PUD Prophylaxis : PPI  DVT Prophylaxis  :  Lovenox    Lab Results  Component Value Date   PLT 428 (H) 10/13/2019    Inpatient Medications  Scheduled Meds: . amLODipine  10 mg  Oral Daily  . ascorbic acid  500 mg Oral Daily  . aspirin  81 mg Oral Daily  . atorvastatin  20 mg Oral Daily  . cholecalciferol  1,000 Units Oral Daily  . clopidogrel  75 mg Oral Daily  . DULoxetine  30 mg Oral Daily  . enoxaparin (LOVENOX) injection  40 mg Subcutaneous Q24H  . fenofibrate  160 mg Oral Daily  . gabapentin  200 mg Oral TID  . glimepiride  4 mg Oral Q breakfast  . insulin aspart  0-20 Units Subcutaneous TID WC  . insulin aspart  0-5 Units Subcutaneous QHS  . insulin aspart  9 Units Subcutaneous TID WC  . magnesium oxide  400 mg Oral Daily  . methylPREDNISolone (SOLU-MEDROL) injection  60 mg Intravenous Daily  . multivitamin with minerals  1 tablet Oral Daily  . omega-3 acid ethyl esters  1 g Oral BID  . pantoprazole  40 mg Oral Daily  . sodium chloride flush  3 mL Intravenous Q12H  . vitamin B-12  1,000 mcg Oral Daily  . zinc sulfate  220 mg Oral Daily   Continuous Infusions:  PRN Meds:.acetaminophen, albuterol, guaiFENesin-dextromethorphan, ondansetron **OR** ondansetron (ZOFRAN) IV, senna-docusate  Antibiotics  :    Anti-infectives (From admission, onward)   Start     Dose/Rate Route Frequency Ordered Stop   10/10/19 1000  remdesivir 100 mg in sodium chloride 0.9 % 100 mL IVPB  Status:  Discontinued  100 mg 200 mL/hr over 30 Minutes Intravenous Daily 10/09/19 1109 10/09/19 1137   10/09/19 1145  remdesivir 100 mg in sodium chloride 0.9 % 100 mL IVPB     100 mg 200 mL/hr over 30 Minutes Intravenous Daily 10/09/19 1137 10/11/19 0906   10/09/19 1115  remdesivir 200 mg in sodium chloride 0.9% 250 mL IVPB  Status:  Discontinued     200 mg 580 mL/hr over 30 Minutes Intravenous Once 10/09/19 1109 10/09/19 1137       Time Spent in minutes  30   Lala Lund M.D on 10/13/2019 at 11:16 AM  To page go to [email or web address] - password [email or web address]  [facility] -  Office  [phone]  See all Orders from today for further details    Objective:   Vitals:    10/13/19 0422 10/13/19 0730 10/13/19 0942 10/13/19 1000  BP: 138/74 (!) 146/75  135/66  Pulse: 80 84  90  Resp: 18 18    Temp: 98 F (36.7 C) 97.8 F (36.6 C)    TempSrc: Oral Oral    SpO2: 95% 96% (!) 89% 91%  Weight:      Height:        Wt Readings from Last 3 Encounters:  10/09/19 74.6 kg  10/07/19 77.1 kg  09/30/19 77.1 kg     Intake/Output Summary (Last 24 hours) at 10/13/2019 1116 Last data filed at 10/13/2019 1000 Gross per 24 hour  Intake 1323 ml  Output -  Net 1323 ml     Physical Exam  Awake Alert,   No new F.N deficits, Normal affect [place].AT,PERRAL Supple Neck,No JVD, No cervical lymphadenopathy appriciated.  Symmetrical Chest wall movement, Good air movement bilaterally, CTAB RRR,No Gallops, Rubs or new Murmurs, No Parasternal Heave +ve B.Sounds, Abd Soft, No tenderness, No organomegaly appriciated, No rebound - guarding or rigidity. No Cyanosis, Clubbing or edema, No new Rash or bruise    Data Review:    CBC Recent Labs  Lab 10/09/19 0336 10/10/19 0350 10/11/19 0340 10/11/19 1056 10/12/19 0452 10/13/19 0250  WBC 5.3 5.2 8.8  --  6.3 7.7  HGB 11.5* 12.1 12.3 11.9* 12.0 12.4  HCT 35.3* 37.5 37.9 35.0* 37.7 38.4  PLT 386 413* 454*  --  437* 428*  MCV 83.6 85.8 85.6  --  85.5 85.5  MCH 27.3 27.7 27.8  --  27.2 27.6  MCHC 32.6 32.3 32.5  --  31.8 32.3  RDW 13.6 13.6 13.5  --  13.7 13.5  LYMPHSABS 0.7 0.6* 0.9  --  0.9 0.8  MONOABS 0.3 0.2 0.3  --  0.2 0.3  EOSABS 0.0 0.0 0.0  --  0.0 0.0  BASOSABS 0.0 0.0 0.0  --  0.0 0.0    Chemistries  Recent Labs  Lab 10/09/19 0336 10/10/19 0350 10/11/19 0340 10/11/19 1056 10/12/19 0452 10/13/19 0250  NA 132* 133* 133* 131* 136 136  K 4.6 4.9 4.1 4.8 4.3 4.8  CL 91* 91* 92*  --  92* 94*  CO2 _0 --  31 32  GLUCOSE 228* 290* 229*  --  167* 171*  BUN 51* 61* 64*  --  63* 60*  CREATININE 0.65 0.71 0.72  --  0.77 0.74  CALCIUM 9.2 9.6 9.5  --  9.2 9.2  MG 2.0 1.9 1.7  --  1.8 2.0  AST _1 --  20 22  ALT _2 --  16 19  ALKPHOS 48 61  59  --  54 51  BILITOT 0.4 0.5 0.5  --  0.5 1.0   ------------------------------------------------------------------------------------------------------------------ No results for input(s): CHOL, HDL, LDLCALC, TRIG, CHOLHDL, LDLDIRECT in the last 72 hours.  Lab Results  Component Value Date   HGBA1C 5.9 (H) 10/07/2019   ------------------------------------------------------------------------------------------------------------------ No results for input(s): TSH, T4TOTAL, T3FREE, THYROIDAB in the last 72 hours.  Invalid input(s): FREET3  Cardiac Enzymes No results for input(s): CKMB, TROPONINI, MYOGLOBIN in the last 168 hours.  Invalid input(s): CK ------------------------------------------------------------------------------------------------------------------    Component Value Date/Time   BNP 50.0 10/13/2019 0225    Micro Results No results found for this or any previous visit (from the past 240 hour(s)).  Radiology Reports CT ANGIO CHEST PE W OR WO CONTRAST  Result Date: 10/11/2019 CLINICAL DATA:  73 year old with COVID-19 pneumonia, with acute worsening of shortness of breath. EXAM: CT ANGIOGRAPHY CHEST WITH CONTRAST TECHNIQUE: Multidetector CT imaging of the chest was performed using the standard protocol during bolus administration of intravenous contrast. Multiplanar CT image reconstructions and MIPs were obtained to evaluate the vascular anatomy. CONTRAST:  69m OMNIPAQUE IOHEXOL 350 MG/ML IV. COMPARISON:  No prior CT chest. FINDINGS: Cardiovascular: Contrast opacification of pulmonary arteries is good. No filling defects within either main pulmonary artery or their segmental branches in either lung to suggest pulmonary embolism. Heart size upper normal to slightly enlarged. Severe three-vessel coronary atherosclerosis. Dense mitral annular calcification no pericardial effusion. Prominent epicardial fat. Severe  atherosclerosis involving the thoracic and proximal abdominal aorta without evidence of aneurysm. Severe atherosclerosis at the origin of the LEFT subclavian artery with calcified plaque and possible hemodynamically significant stenosis. Severe atherosclerosis at the origins of the celiac and SMA with possible borderline hemodynamically significant stenoses. Mediastinum/Nodes: No pathologically enlarged mediastinal, hilar or axillary lymph nodes. No mediastinal masses. Normal-appearing esophagus. Asymmetric enlargement of the RIGHT lobe of the thyroid gland with multiple nodules, the largest approximating 1.3 cm. Lungs/Pleura: Ground-glass airspace opacities diffusely throughout both lungs, confirming the given diagnosis of COVID-19 viral pneumonia. No confluent airspace opacities in either lung. No parenchymal nodules or masses. No pleural effusions. Central airways patent without significant bronchial wall thickening. Upper Abdomen: Surgically absent gallbladder. Mild enlargement of the LEFT adrenal gland without nodularity. Visualized upper abdomen otherwise unremarkable for the early arterial phase of enhancement which accounts for the heterogeneous appearance of the spleen. Musculoskeletal: Prior RIGHT shoulder arthroplasty with anatomic alignment. Severe degenerative disc disease and spondylosis throughout the thoracic spine. No acute findings. Review of the MIP images confirms the above findings. IMPRESSION: 1. No evidence of pulmonary embolism. 2. Ground-glass airspace opacities diffusely throughout both lungs, confirming the given diagnosis of COVID-19 viral pneumonia. 3. Asymmetric enlargement of the RIGHT lobe of the thyroid gland with multiple nodules, the largest approximating 1.3 cm. No imaging followup is needed.(Ref: J Am Coll Radiol. 2015 Feb;12(2): 143-50). 4. Severe three-vessel coronary atherosclerosis. 5. Possible hemodynamically significant stenosis involving the LEFT subclavian artery. Does  the patient have symptoms related to LEFT UPPER EXTREMITY claudication? Aortic Atherosclerosis (ICD10-I70.0). Electronically Signed   By: TEvangeline DakinM.D.   On: 10/11/2019 12:40   DG Chest Port 1 View  Result Date: 10/11/2019 CLINICAL DATA:  Shortness of breath EXAM: PORTABLE CHEST 1 VIEW COMPARISON:  October 07, 2019 FINDINGS: The mediastinal contour and cardiac silhouette are stable. Heart size is enlarged. Patchy ground-glass opacities identified throughout both lungs slightly improved compared prior exam. There is no pleural effusion. Bony structures are stable. IMPRESSION: Patchy ground-glass opacities identified throughout both lungs  slightly improved compared prior exam. This could be due to underlying pneumonia. Electronically Signed   By: Abelardo Diesel M.D.   On: 10/11/2019 09:09   DG Chest Portable 1 View  Result Date: 10/07/2019 CLINICAL DATA:  COVID positive EXAM: PORTABLE CHEST 1 VIEW COMPARISON:  February 09, 2016 FINDINGS: There is mild cardiomegaly. Extensive patchy/of the ground-glass opacities are seen throughout both lungs minimally within the mid to lower lungs. No pleural effusion is seen. No acute osseous abnormality. The patient is status post right total shoulder arthroplasty. IMPRESSION: Extensive bilateral ground-glass opacities, consistent with multifocal viral pneumonia. Electronically Signed   By: Prudencio Pair M.D.   On: 10/07/2019 19:01   MM 3D SCREEN BREAST BILATERAL  Result Date: 09/25/2019 CLINICAL DATA:  Screening. EXAM: DIGITAL SCREENING BILATERAL MAMMOGRAM WITH TOMO AND CAD COMPARISON:  Previous exam(s). ACR Breast Density Category b: There are scattered areas of fibroglandular density. FINDINGS: In the left breast, a possible mass warrants further evaluation. This possible mass is seen within the upper inner quadrant of the LEFT breast, CC slice 22 and MLO slice 21, suspected fat necrosis. In the right breast, no findings suspicious for malignancy. Images were  processed with CAD. IMPRESSION: Further evaluation is suggested for possible mass in the left breast. RECOMMENDATION: Diagnostic mammogram and possibly ultrasound of the left breast. (Code:[id number]) The patient will be contacted regarding the findings, and additional imaging will be scheduled. BI-RADS CATEGORY  0: Incomplete. Need additional imaging evaluation and/or prior mammograms for comparison. Electronically Signed   By: Franki Cabot M.D.   On: 09/25/2019 11:16   ABI  Result Date: 09/28/2019 LOWER EXTREMITY DOPPLER STUDY Indications: Claudication, and ulceration. High Risk Factors: Hypertension.  Vascular Interventions: 08/05/2018: Abdominal Aortogram, Lt Lower Extremity                         distal Runoff. Comparison Study: 03/23/2019 Performing Technologist: Charlane Ferretti RT (R)(VS)  Examination Guidelines: A complete evaluation includes at minimum, Doppler waveform signals and systolic blood pressure reading at the level of bilateral brachial, anterior tibial, and posterior tibial arteries, when vessel segments are accessible. Bilateral testing is considered an integral part of a complete examination. Photoelectric Plethysmograph (PPG) waveforms and toe systolic pressure readings are included as required and additional duplex testing as needed. Limited examinations for reoccurring indications may be performed as noted.  ABI Findings: +---------+------------------+-----+----------+----------------+ Right    Rt Pressure (mmHg)IndexWaveform  Comment          +---------+------------------+-----+----------+----------------+ Brachial 209                                               +---------+------------------+-----+----------+----------------+ ATA      158               0.74 monophasic                 +---------+------------------+-----+----------+----------------+ PTA                             monophasicNon compressable  +---------+------------------+-----+----------+----------------+ Great Toe104               0.49 Abnormal                   +---------+------------------+-----+----------+----------------+ +---------+------------------+-----+----------+----------------+ Left     Lt Pressure (mmHg)IndexWaveform  Comment          +---------+------------------+-----+----------+----------------+ Brachial 213                                               +---------+------------------+-----+----------+----------------+ ATA      136               0.64 monophasic                 +---------+------------------+-----+----------+----------------+ PTA                             monophasicNon compressable +---------+------------------+-----+----------+----------------+ Great Toe49                0.23 Abnormal                   +---------+------------------+-----+----------+----------------+ +-------+-----------+-----------+------------+------------+ ABI/TBIToday's ABIToday's TBIPrevious ABIPrevious TBI +-------+-----------+-----------+------------+------------+ Right  .74        .37        [facility]          .35          +-------+-----------+-----------+------------+------------+ Left   .64        .23        [facility]          .23          +-------+-----------+-----------+------------+------------+ Bilateral ABIs appear essentially unchanged compared to prior study on 03/23/2019. Bilateral TBIs appear essentially unchanged compared to prior study on 03/23/2019. Summary: Right: Resting right ankle-brachial index indicates moderate right lower extremity arterial disease. ABIs are unreliable. Right posterior tibial artery is non compressable suggesting medial calcification. Left: Resting left ankle-brachial index indicates moderate left lower extremity arterial disease. ABIs are unreliable. Left posterior tibial artery is non compressable suggesting medial calcification.  *See table(s) above for measurements and  observations.  Electronically signed by [name] MD on 09/28/2019 at 5:15:25 PM.    Final

## 2019-10-13 NOTE — Progress Notes (Signed)
MD notified that glucose this evening in 425. At this time he would like to order 30 units novolog. He would like to hold SS coverage and sch 6 units and only give 30 units total. Se MAR for details.

## 2019-10-14 LAB — CBC WITH DIFFERENTIAL/PLATELET
Abs Immature Granulocytes: 0.18 10*3/uL — ABNORMAL HIGH (ref 0.00–0.07)
Basophils Absolute: 0 10*3/uL (ref 0.0–0.1)
Basophils Relative: 0 %
Eosinophils Absolute: 0.1 10*3/uL (ref 0.0–0.5)
Eosinophils Relative: 1 %
HCT: 40 % (ref 36.0–46.0)
Hemoglobin: 12.9 g/dL (ref 12.0–15.0)
Immature Granulocytes: 2 %
Lymphocytes Relative: 8 %
Lymphs Abs: 0.7 10*3/uL (ref 0.7–4.0)
MCH: 28 pg (ref 26.0–34.0)
MCHC: 32.3 g/dL (ref 30.0–36.0)
MCV: 86.8 fL (ref 80.0–100.0)
Monocytes Absolute: 0.4 10*3/uL (ref 0.1–1.0)
Monocytes Relative: 4 %
Neutro Abs: 8.2 10*3/uL — ABNORMAL HIGH (ref 1.7–7.7)
Neutrophils Relative %: 85 %
Platelets: 488 10*3/uL — ABNORMAL HIGH (ref 150–400)
RBC: 4.61 MIL/uL (ref 3.87–5.11)
RDW: 13.7 % (ref 11.5–15.5)
WBC: 9.6 10*3/uL (ref 4.0–10.5)
nRBC: 0 % (ref 0.0–0.2)

## 2019-10-14 LAB — COMPREHENSIVE METABOLIC PANEL
ALT: 20 U/L (ref 0–44)
AST: 25 U/L (ref 15–41)
Albumin: 2.9 g/dL — ABNORMAL LOW (ref 3.5–5.0)
Alkaline Phosphatase: 53 U/L (ref 38–126)
Anion gap: 11 (ref 5–15)
BUN: 53 mg/dL — ABNORMAL HIGH (ref 8–23)
CO2: 31 mmol/L (ref 22–32)
Calcium: 9.3 mg/dL (ref 8.9–10.3)
Chloride: 91 mmol/L — ABNORMAL LOW (ref 98–111)
Creatinine, Ser: 0.7 mg/dL (ref 0.44–1.00)
GFR calc Af Amer: >60 mL/min (ref >60)
GFR calc non Af Amer: >60 mL/min (ref >60)
Glucose, Bld: 228 mg/dL — ABNORMAL HIGH (ref 70–99)
Potassium: 4.9 mmol/L (ref 3.5–5.1)
Sodium: 133 mmol/L — ABNORMAL LOW (ref 135–145)
Total Bilirubin: 0.7 mg/dL (ref 0.3–1.2)
Total Protein: 6.8 g/dL (ref 6.5–8.1)

## 2019-10-14 LAB — GLUCOSE, CAPILLARY
Glucose-Capillary: 207 mg/dL — ABNORMAL HIGH (ref 70–99)
Glucose-Capillary: 163 mg/dL — ABNORMAL HIGH (ref 70–99)
Glucose-Capillary: 211 mg/dL — ABNORMAL HIGH (ref 70–99)
Glucose-Capillary: 333 mg/dL — ABNORMAL HIGH (ref 70–99)
Glucose-Capillary: 366 mg/dL — ABNORMAL HIGH (ref 70–99)

## 2019-10-14 LAB — D-DIMER, QUANTITATIVE: D-Dimer, Quant: 1.66 ug/mL-FEU — ABNORMAL HIGH (ref 0.00–0.50)

## 2019-10-14 LAB — MAGNESIUM: Magnesium: 2 mg/dL (ref 1.7–2.4)

## 2019-10-14 LAB — BRAIN NATRIURETIC PEPTIDE: B Natriuretic Peptide: 45.9 pg/mL (ref 0.0–100.0)

## 2019-10-14 LAB — C-REACTIVE PROTEIN: CRP: 0.9 mg/dL (ref <1.0)

## 2019-10-14 MED ORDER — DEXAMETHASONE SODIUM PHOSPHATE 10 MG/ML IJ SOLN
6.0000 mg | INTRAMUSCULAR | Status: AC
  Administered 2019-10-15 – 2019-10-16 (×2): 6 mg via INTRAVENOUS
  Filled 2019-10-14 (×2): qty 1

## 2019-10-14 MED ORDER — INSULIN ASPART 100 UNIT/ML ~~LOC~~ SOLN
10.0000 [IU] | Freq: Three times a day (TID) | SUBCUTANEOUS | Status: DC
  Administered 2019-10-15 – 2019-10-17 (×8): 10 [IU] via SUBCUTANEOUS

## 2019-10-14 NOTE — Progress Notes (Signed)
Inpatient Diabetes Program Recommendations  AACE/ADA: New Consensus Statement on Inpatient Glycemic Control (2015)  Target Ranges:  Prepandial:   less than 140 mg/dL      Peak postprandial:   less than 180 mg/dL (1-2 hours)      Critically ill patients:  140 - 180 mg/dL   Lab Results  Component Value Date   GLUCAP 333 (H) 10/14/2019   HGBA1C 5.9 (H) 10/07/2019    Review of Glycemic Control  Post-prandials elevated. Needs Novolog titration. FBS acceptable.  Inpatient Diabetes Program Recommendations:     Increase Novolog to 10 units tidwc  Will follow trends.  Thank you. Lorenda Peck, RD, LDN, CDE Inpatient Diabetes Coordinator [phone]

## 2019-10-14 NOTE — Progress Notes (Signed)
PROGRESS NOTE    Christina Blackburn  [id number] DOB: 1946-09-21 DOA: 10/09/2019 PCP: Idelle Crouch, MD   Brief Narrative:  73 yo with hx of coronary artery disease from Rini Moffit peripheral arterial disease status post aortobiiliac stents, non-insulin-dependent type 2 diabetes and hypertension, who was diagnosed with COVID-19 on 09/29/2021 presents to the emergency room with Christina Blackburn complaint of increasing shortness of breath, she was diagnosed with acute hypoxic respiratory failure admitted to [facility] and then subsequently transferred to [facility] for further care.   Assessment & Plan:   Active Problems:   COVID-19 virus infection  # Acute Hypoxic Resp. Failure due to Acute Covid 19 Viral Pneumonitis  Improved on 3 L this morning S/p actemra 12/28 S/p remdesivir Steroids I/O, daily weights IS, prone as able, OOB Daily labs  COVID-19 Labs  Recent Labs    10/12/19 0452 10/13/19 0225 10/13/19 0250 10/14/19 0025  DDIMER 1.66*  --  1.82* 1.66*  CRP 2.5* 1.1*  --  0.9    Lab Results  Component Value Date   SARSCOV2NAA DETECTED (Larue Drawdy) 09/30/2019   2.  History of CAD, PAD endarterectomy along with aortic and bilateral iliac stents, continue dual antiplatelet therapy along with statin for secondary prevention.  Outpatient vascular surgery and PCP follow-up for risk factor modification.  3.  Obesity.  BMI of 34.  Follow with PCP.  4.  Hypertension.  For now Norvasc.  5.  Incidental CT finding of thyroid nodule and left subclavian stenosis.  Follow with PCP for age-appropriate follow-up.    6. Non-insulin-dependent DM type II.  Continue levemir, mealtime, SSI  DVT prophylaxis: lovenox Code Status: full Family Communication: none at bedside Disposition Plan: pending further improvement  Consultants:   none  Procedures:   none  Antimicrobials:  Anti-infectives (From admission, onward)   Start     Dose/Rate Route Frequency Ordered Stop   10/10/19 1000   remdesivir 100 mg in sodium chloride 0.9 % 100 mL IVPB  Status:  Discontinued     100 mg 200 mL/hr over 30 Minutes Intravenous Daily 10/09/19 1109 10/09/19 1137   10/09/19 1145  remdesivir 100 mg in sodium chloride 0.9 % 100 mL IVPB     100 mg 200 mL/hr over 30 Minutes Intravenous Daily 10/09/19 1137 10/11/19 0906   10/09/19 1115  remdesivir 200 mg in sodium chloride 0.9% 250 mL IVPB  Status:  Discontinued     200 mg 580 mL/hr over 30 Minutes Intravenous Once 10/09/19 1109 10/09/19 1137     Subjective: No new complaints  Objective: Vitals:   10/14/19 0500 10/14/19 0600 10/14/19 0700 10/14/19 0800  BP: (!) 156/83 (!) 149/76 (!) 146/83 132/70  Pulse: 80 80 100 76  Resp:   18   Temp:   97.6 F (36.4 C)   TempSrc:   Oral   SpO2: 95% 95% 92% 92%  Weight:      Height:        Intake/Output Summary (Last 24 hours) at 10/14/2019 1738 Last data filed at 10/14/2019 0800 Gross per 24 hour  Intake 720 ml  Output --  Net 720 ml   Filed Weights   10/09/19 1028 10/09/19 1714  Weight: 74.6 kg 74.6 kg    Examination:  General exam: Appears calm and comfortable  Respiratory system: Clear to auscultation. Respiratory effort normal. Cardiovascular system: RRR Gastrointestinal system: Abdomen is nondistended, soft and nontender. Central nervous system: Alert and oriented. No focal neurological deficits. Extremities: no LEE Skin: No rashes, lesions  or ulcers Psychiatry: Judgement and insight appear normal. Mood & affect appropriate.     Data Reviewed: I have personally reviewed following labs and imaging studies  CBC: Recent Labs  Lab 10/10/19 0350 10/11/19 0340 10/11/19 1056 10/12/19 0452 10/13/19 0250 10/14/19 0025  WBC 5.2 8.8  --  6.3 7.7 9.6  NEUTROABS 4.4 7.4  --  5.0 6.5 8.2*  HGB 12.1 12.3 11.9* 12.0 12.4 12.9  HCT 37.5 37.9 35.0* 37.7 38.4 40.0  MCV 85.8 85.6  --  85.5 85.5 86.8  PLT 413* 454*  --  437* 428* [id number]*   Basic Metabolic Panel: Recent Labs  Lab  10/08/19 0416 10/09/19 0336 10/10/19 0350 10/11/19 0340 10/11/19 1056 10/12/19 0452 10/13/19 0250 10/14/19 0025  NA 134* 132* 133* 133* 131* 136 136 133*  K 3.9 4.6 4.9 4.1 4.8 4.3 4.8 4.9  CL 94* 91* 91* 92*  --  92* 94* 91*  CO2 23 28 27 30   --  31 32 31  GLUCOSE 268* 228* 290* 229*  --  167* 171* 228*  BUN 34* 51* 61* 64*  --  63* 60* 53*  CREATININE 0.62 0.65 0.71 0.72  --  0.77 0.74 0.70  CALCIUM 8.7* 9.2 9.6 9.5  --  9.2 9.2 9.3  MG 1.7 2.0 1.9 1.7  --  1.8 2.0 2.0  PHOS 3.7 3.1  --   --   --   --   --   --    GFR: Estimated Creatinine Clearance: 56.8 mL/min (by C-G formula based on SCr of 0.7 mg/dL). Liver Function Tests: Recent Labs  Lab 10/10/19 0350 10/11/19 0340 10/12/19 0452 10/13/19 0250 10/14/19 0025  AST 24 21 20 22 25   ALT 16 17 16 19 20   ALKPHOS 61 59 54 51 53  BILITOT 0.5 0.5 0.5 1.0 0.7  PROT 6.7 6.6 6.6 6.4* 6.8  ALBUMIN 2.7* 2.7* 2.7* 2.9* 2.9*   No results for input(s): LIPASE, AMYLASE in the last 168 hours. No results for input(s): AMMONIA in the last 168 hours. Coagulation Profile: Recent Labs  Lab 10/07/19 2320  INR 1.0   Cardiac Enzymes: No results for input(s): CKTOTAL, CKMB, CKMBINDEX, TROPONINI in the last 168 hours. BNP (last 3 results) No results for input(s): PROBNP in the last 8760 hours. HbA1C: No results for input(s): HGBA1C in the last 72 hours. CBG: Recent Labs  Lab 10/13/19 2339 10/14/19 0310 10/14/19 0804 10/14/19 1121 10/14/19 1553  GLUCAP 295* 207* 163* 211* 333*   Lipid Profile: No results for input(s): CHOL, HDL, LDLCALC, TRIG, CHOLHDL, LDLDIRECT in the last 72 hours. Thyroid Function Tests: No results for input(s): TSH, T4TOTAL, FREET4, T3FREE, THYROIDAB in the last 72 hours. Anemia Panel: No results for input(s): VITAMINB12, FOLATE, FERRITIN, TIBC, IRON, RETICCTPCT in the last 72 hours. Sepsis Labs: No results for input(s): PROCALCITON, LATICACIDVEN in the last 168 hours.  No results found for this or  any previous visit (from the past 240 hour(s)).       Radiology Studies: No results found.      Scheduled Meds: . amLODipine  10 mg Oral Daily  . ascorbic acid  500 mg Oral Daily  . aspirin  81 mg Oral Daily  . atorvastatin  20 mg Oral Daily  . cholecalciferol  1,000 Units Oral Daily  . clopidogrel  75 mg Oral Daily  . DULoxetine  30 mg Oral Daily  . enoxaparin (LOVENOX) injection  40 mg Subcutaneous Q24H  . fenofibrate  160 mg Oral  Daily  . gabapentin  200 mg Oral TID  . insulin aspart  0-20 Units Subcutaneous TID WC  . insulin aspart  0-5 Units Subcutaneous QHS  . [START ON 10/15/2019] insulin aspart  10 Units Subcutaneous TID WC  . insulin detemir  15 Units Subcutaneous QHS  . magnesium oxide  400 mg Oral Daily  . methylPREDNISolone (SOLU-MEDROL) injection  40 mg Intravenous Daily  . multivitamin with minerals  1 tablet Oral Daily  . omega-3 acid ethyl esters  1 g Oral BID  . pantoprazole  40 mg Oral Daily  . sodium chloride flush  3 mL Intravenous Q12H  . vitamin B-12  1,000 mcg Oral Daily  . zinc sulfate  220 mg Oral Daily   Continuous Infusions:   LOS: 5 days    Time spent: over 30 min    Fayrene Helper, MD [facility] Pager AMION  If 7PM-7AM, please contact night-coverage [email or web address] Password [facility] 10/14/2019, 5:38 PM

## 2019-10-14 NOTE — Progress Notes (Signed)
Daughter called, no answer at this time.

## 2019-10-15 LAB — GLUCOSE, CAPILLARY
Glucose-Capillary: 100 mg/dL — ABNORMAL HIGH (ref 70–99)
Glucose-Capillary: 173 mg/dL — ABNORMAL HIGH (ref 70–99)
Glucose-Capillary: 219 mg/dL — ABNORMAL HIGH (ref 70–99)
Glucose-Capillary: 293 mg/dL — ABNORMAL HIGH (ref 70–99)

## 2019-10-15 LAB — CBC WITH DIFFERENTIAL/PLATELET
Abs Immature Granulocytes: 0.23 10*3/uL — ABNORMAL HIGH (ref 0.00–0.07)
Basophils Absolute: 0 10*3/uL (ref 0.0–0.1)
Basophils Relative: 0 %
Eosinophils Absolute: 0.1 10*3/uL (ref 0.0–0.5)
Eosinophils Relative: 1 %
HCT: 40 % (ref 36.0–46.0)
Hemoglobin: 12.7 g/dL (ref 12.0–15.0)
Immature Granulocytes: 3 %
Lymphocytes Relative: 12 %
Lymphs Abs: 1 10*3/uL (ref 0.7–4.0)
MCH: 27.4 pg (ref 26.0–34.0)
MCHC: 31.8 g/dL (ref 30.0–36.0)
MCV: 86.2 fL (ref 80.0–100.0)
Monocytes Absolute: 0.3 10*3/uL (ref 0.1–1.0)
Monocytes Relative: 4 %
Neutro Abs: 6.8 10*3/uL (ref 1.7–7.7)
Neutrophils Relative %: 80 %
Platelets: 469 10*3/uL — ABNORMAL HIGH (ref 150–400)
RBC: 4.64 MIL/uL (ref 3.87–5.11)
RDW: 13.8 % (ref 11.5–15.5)
WBC: 8.5 10*3/uL (ref 4.0–10.5)
nRBC: 0 % (ref 0.0–0.2)

## 2019-10-15 LAB — COMPREHENSIVE METABOLIC PANEL
ALT: 22 U/L (ref 0–44)
AST: 20 U/L (ref 15–41)
Albumin: 2.9 g/dL — ABNORMAL LOW (ref 3.5–5.0)
Alkaline Phosphatase: 50 U/L (ref 38–126)
Anion gap: 11 (ref 5–15)
BUN: 40 mg/dL — ABNORMAL HIGH (ref 8–23)
CO2: 30 mmol/L (ref 22–32)
Calcium: 8.9 mg/dL (ref 8.9–10.3)
Chloride: 92 mmol/L — ABNORMAL LOW (ref 98–111)
Creatinine, Ser: 0.68 mg/dL (ref 0.44–1.00)
GFR calc Af Amer: >60 mL/min (ref >60)
GFR calc non Af Amer: >60 mL/min (ref >60)
Glucose, Bld: 148 mg/dL — ABNORMAL HIGH (ref 70–99)
Potassium: 4.5 mmol/L (ref 3.5–5.1)
Sodium: 133 mmol/L — ABNORMAL LOW (ref 135–145)
Total Bilirubin: 1.1 mg/dL (ref 0.3–1.2)
Total Protein: 6 g/dL — ABNORMAL LOW (ref 6.5–8.1)

## 2019-10-15 LAB — BRAIN NATRIURETIC PEPTIDE: B Natriuretic Peptide: 50.5 pg/mL (ref 0.0–100.0)

## 2019-10-15 LAB — D-DIMER, QUANTITATIVE: D-Dimer, Quant: 1.3 ug/mL-FEU — ABNORMAL HIGH (ref 0.00–0.50)

## 2019-10-15 LAB — FERRITIN: Ferritin: 155 ng/mL (ref 11–307)

## 2019-10-15 LAB — MAGNESIUM: Magnesium: 1.8 mg/dL (ref 1.7–2.4)

## 2019-10-15 LAB — C-REACTIVE PROTEIN: CRP: 0.6 mg/dL (ref <1.0)

## 2019-10-15 NOTE — Progress Notes (Signed)
Physical Therapy Treatment Patient Details Name: Christina Blackburn MRN: [id number] DOB: [birth year] Today's Date: 10/15/2019    History of Present Illness 73 y/o female pt coronary artery disease from a peripheral arterial disease status post aortobiiliac stents, non-insulin-dependent type 2 diabetes and hypertension, who was diagnosed with COVID-19 on 09/29/2021 presents to the emergency room with a complaint of increasing shortness of breath, she was diagnosed with acute hypoxic respiratory failure admitted to [facility] and then subsequently transferred to [facility] for further care.    PT Comments    Pt making great progress with mobility this pm, was able to complete all tasks with set up and stand by assist. Pt c/o legs swelling and hence has been in bed all day, pt was given supine the ex to work on while in bed to promote circulation and decreased swelling, pt agreeable to these. Completed bed mob and transfers with stand by assist and was able to ambulate in room 1 x 56ft with RW and SBA, 1 x 3ft with SBA and RW, on both ambulation attempts monitor stated that pt had desat to 80% (on 4L/min via HFNC), on each attempt pt took less than 2 mins to recover and improve saturations to 90s.   Follow Up Recommendations  Home health PT     Equipment Recommendations  Rolling walker with 5" wheels    Recommendations for Other Services       Precautions / Restrictions Precautions Precautions: Fall Restrictions Weight Bearing Restrictions: No    Mobility  Bed Mobility Overal bed mobility: Needs Assistance Bed Mobility: Supine to Sit;Sit to Supine     Supine to sit: Supervision Sit to supine: Supervision      Transfers Overall transfer level: Needs assistance Equipment used: Rolling walker (2 wheeled) Transfers: Sit to/from Stand Sit to Stand: Supervision            Ambulation/Gait   Gait Distance (Feet): 88 Feet Assistive device: Rolling walker (2 wheeled) Gait  Pattern/deviations: Step-through pattern     General Gait Details: ambulated 1 x 42ft with RW and SBA, and 1 x 48ft with RW and SBA, pt on 4L/min with this and monitor stated she desat to 80% on both times taking <49mins to recover with pursed lip breathing to 90s   Stairs             Wheelchair Mobility    Modified Rankin (Stroke Patients Only)       Balance Overall balance assessment: Mild deficits observed, not formally tested                                          Cognition Arousal/Alertness: Awake/alert Behavior During Therapy: WFL for tasks assessed/performed Overall Cognitive Status: Within Functional Limits for tasks assessed                                        Exercises General Exercises - Lower Extremity Ankle Circles/Pumps: Strengthening;Both;20 reps Heel Slides: Strengthening;10 reps;Both Hip ABduction/ADduction: Strengthening;Both;10 reps Straight Leg Raises: Strengthening;Both;10 reps Other Exercises Other Exercises: flutter valve use x 10    General Comments        Pertinent Vitals/Pain Pain Assessment: Faces Faces Pain Scale: Hurts little more Pain Location: in R leg with exercises Pain Intervention(s): Monitored during session  Home Living                      Prior Function            PT Goals (current goals can now be found in the care plan section) Acute Rehab PT Goals Patient Stated Goal: to get better and go home PT Goal Formulation: With patient Time For Goal Achievement: 10/24/19 Potential to Achieve Goals: Good Progress towards PT goals: Progressing toward goals    Frequency    Min 3X/week      PT Plan Current plan remains appropriate    Co-evaluation              AM-PAC PT "6 Clicks" Mobility   Outcome Measure  Help needed turning from your back to your side while in a flat bed without using bedrails?: None Help needed moving from lying on your back to  sitting on the side of a flat bed without using bedrails?: None Help needed moving to and from a bed to a chair (including a wheelchair)?: A Little Help needed standing up from a chair using your arms (e.g., wheelchair or bedside chair)?: A Little Help needed to walk in hospital room?: A Little Help needed climbing 3-5 steps with a railing? : A Lot 6 Click Score: 19    End of Session Equipment Utilized During Treatment: Oxygen Activity Tolerance: Patient tolerated treatment well Patient left: in bed;with call bell/phone within reach Nurse Communication: Mobility status PT Visit Diagnosis: Other abnormalities of gait and mobility (R26.89);Muscle weakness (generalized) (M62.81)     Time: [id number] PT Time Calculation (min) (ACUTE ONLY): 24 min  Charges:  $Gait Training: 8-22 mins $Therapeutic Exercise: 8-22 mins                     Horald Chestnut, PT    Delford Field 10/15/2019, 3:54 PM

## 2019-10-15 NOTE — Progress Notes (Signed)
PROGRESS NOTE    Christina Blackburn  [id number] DOB: 06-20-1946 DOA: 10/09/2019 PCP: Idelle Crouch, MD   Brief Narrative:  73 yo with hx of coronary artery disease from Yumi Insalaco peripheral arterial disease status post aortobiiliac stents, non-insulin-dependent type 2 diabetes and hypertension, who was diagnosed with COVID-19 on 09/29/2021 presents to the emergency room with Alaysiah Browder complaint of increasing shortness of breath, she was diagnosed with acute hypoxic respiratory failure admitted to [facility] and then subsequently transferred to [facility] for further care.   Assessment & Plan:   Active Problems:   COVID-19 virus infection  # Acute Hypoxic Resp. Failure due to Acute Covid 19 Viral Pneumonitis  Improved on 3 L this morning, wean as tolerated S/p actemra 12/28 S/p remdesivir Steroids, day 9 I/O, daily weights IS, prone as able, OOB Daily labs  COVID-19 Labs  Recent Labs    10/13/19 0225 10/13/19 0250 10/14/19 0025 10/15/19 0130  DDIMER  --  1.82* 1.66* 1.30*  FERRITIN  --   --   --  155  CRP 1.1*  --  0.9 0.6    Lab Results  Component Value Date   SARSCOV2NAA DETECTED (Jarely Juncaj) 09/30/2019   2.  History of CAD, PAD endarterectomy along with aortic and bilateral iliac stents, continue dual antiplatelet therapy along with statin for secondary prevention.  Outpatient vascular surgery and PCP follow-up for risk factor modification.  3.  Obesity.  BMI of 34.  Follow with PCP.  4.  Hypertension.  For now Norvasc.  5.  Incidental CT finding of thyroid nodule and left subclavian stenosis.  Follow with PCP for age-appropriate follow-up.    6. Non-insulin-dependent DM type II.  Continue levemir, mealtime, SSI  DVT prophylaxis: lovenox Code Status: full Family Communication: none at bedside Disposition Plan: pending further improvement  Consultants:   none  Procedures:   none  Antimicrobials:  Anti-infectives (From admission, onward)   Start     Dose/Rate  Route Frequency Ordered Stop   10/10/19 1000  remdesivir 100 mg in sodium chloride 0.9 % 100 mL IVPB  Status:  Discontinued     100 mg 200 mL/hr over 30 Minutes Intravenous Daily 10/09/19 1109 10/09/19 1137   10/09/19 1145  remdesivir 100 mg in sodium chloride 0.9 % 100 mL IVPB     100 mg 200 mL/hr over 30 Minutes Intravenous Daily 10/09/19 1137 10/11/19 0906   10/09/19 1115  remdesivir 200 mg in sodium chloride 0.9% 250 mL IVPB  Status:  Discontinued     200 mg 580 mL/hr over 30 Minutes Intravenous Once 10/09/19 1109 10/09/19 1137     Subjective: No new complaints Feels ok  Objective: Vitals:   10/15/19 0400 10/15/19 0748 10/15/19 0953 10/15/19 1143  BP: (!) 152/90 (!) 149/80 129/73 (!) 144/77  Pulse: 86 88  98  Resp: 20 18  19   Temp: 98 F (36.7 C) 98.1 F (36.7 C)  98.7 F (37.1 C)  TempSrc: Oral Oral  Oral  SpO2: 91% 94%  95%  Weight:      Height:        Intake/Output Summary (Last 24 hours) at 10/15/2019 1457 Last data filed at 10/15/2019 0900 Gross per 24 hour  Intake 420 ml  Output --  Net 420 ml   Filed Weights   10/09/19 1028 10/09/19 1714  Weight: 74.6 kg 74.6 kg    Examination:  General: No acute distress. Cardiovascular: RRR Lungs: Clear to auscultation bilaterally with good air movement.  Abdomen: Soft,  nontender, nondistended  Neurological: Alert and oriented 3. Moves all extremities 4 . Cranial nerves II through XII grossly intact. Skin: Warm and dry. No rashes or lesions. Extremities: No clubbing or cyanosis. No edema.  Data Reviewed: I have personally reviewed following labs and imaging studies  CBC: Recent Labs  Lab 10/11/19 0340 10/11/19 1056 10/12/19 0452 10/13/19 0250 10/14/19 0025 10/15/19 0130  WBC 8.8  --  6.3 7.7 9.6 8.5  NEUTROABS 7.4  --  5.0 6.5 8.2* 6.8  HGB 12.3 11.9* 12.0 12.4 12.9 12.7  HCT 37.9 35.0* 37.7 38.4 40.0 40.0  MCV 85.6  --  85.5 85.5 86.8 86.2  PLT 454*  --  437* 428* 488* [id number]*   Basic Metabolic  Panel: Recent Labs  Lab 10/09/19 0336 10/11/19 0340 10/11/19 1056 10/12/19 0452 10/13/19 0250 10/14/19 0025 10/15/19 0130  NA 132* 133* 131* 136 136 133* 133*  K 4.6 4.1 4.8 4.3 4.8 4.9 4.5  CL 91* 92*  --  92* 94* 91* 92*  CO2 28 30  --  31 32 31 30  GLUCOSE 228* 229*  --  167* 171* 228* 148*  BUN 51* 64*  --  63* 60* 53* 40*  CREATININE 0.65 0.72  --  0.77 0.74 0.70 0.68  CALCIUM 9.2 9.5  --  9.2 9.2 9.3 8.9  MG 2.0 1.7  --  1.8 2.0 2.0 1.8  PHOS 3.1  --   --   --   --   --   --    GFR: Estimated Creatinine Clearance: 56.8 mL/min (by C-G formula based on SCr of 0.68 mg/dL). Liver Function Tests: Recent Labs  Lab 10/11/19 0340 10/12/19 0452 10/13/19 0250 10/14/19 0025 10/15/19 0130  AST 21 20 22 25 20   ALT 17 16 19 20 22   ALKPHOS 59 54 51 53 50  BILITOT 0.5 0.5 1.0 0.7 1.1  PROT 6.6 6.6 6.4* 6.8 6.0*  ALBUMIN 2.7* 2.7* 2.9* 2.9* 2.9*   No results for input(s): LIPASE, AMYLASE in the last 168 hours. No results for input(s): AMMONIA in the last 168 hours. Coagulation Profile: No results for input(s): INR, PROTIME in the last 168 hours. Cardiac Enzymes: No results for input(s): CKTOTAL, CKMB, CKMBINDEX, TROPONINI in the last 168 hours. BNP (last 3 results) No results for input(s): PROBNP in the last 8760 hours. HbA1C: No results for input(s): HGBA1C in the last 72 hours. CBG: Recent Labs  Lab 10/14/19 1121 10/14/19 1553 10/14/19 2020 10/15/19 0733 10/15/19 1146  GLUCAP 211* 333* 366* 100* 173*   Lipid Profile: No results for input(s): CHOL, HDL, LDLCALC, TRIG, CHOLHDL, LDLDIRECT in the last 72 hours. Thyroid Function Tests: No results for input(s): TSH, T4TOTAL, FREET4, T3FREE, THYROIDAB in the last 72 hours. Anemia Panel: Recent Labs    10/15/19 0130  FERRITIN 155   Sepsis Labs: No results for input(s): PROCALCITON, LATICACIDVEN in the last 168 hours.  No results found for this or any previous visit (from the past 240 hour(s)).        Radiology Studies: No results found.      Scheduled Meds: . amLODipine  10 mg Oral Daily  . ascorbic acid  500 mg Oral Daily  . aspirin  81 mg Oral Daily  . atorvastatin  20 mg Oral Daily  . cholecalciferol  1,000 Units Oral Daily  . clopidogrel  75 mg Oral Daily  . dexamethasone (DECADRON) injection  6 mg Intravenous Q24H  . DULoxetine  30 mg Oral Daily  .  enoxaparin (LOVENOX) injection  40 mg Subcutaneous Q24H  . fenofibrate  160 mg Oral Daily  . gabapentin  200 mg Oral TID  . insulin aspart  0-20 Units Subcutaneous TID WC  . insulin aspart  0-5 Units Subcutaneous QHS  . insulin aspart  10 Units Subcutaneous TID WC  . insulin detemir  15 Units Subcutaneous QHS  . magnesium oxide  400 mg Oral Daily  . multivitamin with minerals  1 tablet Oral Daily  . omega-3 acid ethyl esters  1 g Oral BID  . pantoprazole  40 mg Oral Daily  . sodium chloride flush  3 mL Intravenous Q12H  . vitamin B-12  1,000 mcg Oral Daily  . zinc sulfate  220 mg Oral Daily   Continuous Infusions:   LOS: 6 days    Time spent: over 30 min    Fayrene Helper, MD [facility] Pager AMION  If 7PM-7AM, please contact night-coverage [email or web address] Password [email or web address] 10/15/2019, 2:57 PM

## 2019-10-15 NOTE — Progress Notes (Signed)
Pt had a pleasant night this shift. Offered bath and assistance into bed several times this shift, pt declined, advising she took a bath yest morning and she prefers to sit/rest in chair. Writer offered pt to recline position, pt also declined. Pt denies pain this shift. No episodes of sob. Offered water and emptied bsc as needed this shift. No issues or concerns to report.

## 2019-10-15 NOTE — Progress Notes (Signed)
Daughter updated. Daughter also has covid and is hospitalized

## 2019-10-16 LAB — CBC WITH DIFFERENTIAL/PLATELET
Abs Immature Granulocytes: 0.22 10*3/uL — ABNORMAL HIGH (ref 0.00–0.07)
Basophils Absolute: 0 10*3/uL (ref 0.0–0.1)
Basophils Relative: 0 %
Eosinophils Absolute: 0.2 10*3/uL (ref 0.0–0.5)
Eosinophils Relative: 2 %
HCT: 39.3 % (ref 36.0–46.0)
Hemoglobin: 12.5 g/dL (ref 12.0–15.0)
Immature Granulocytes: 3 %
Lymphocytes Relative: 15 %
Lymphs Abs: 1.1 10*3/uL (ref 0.7–4.0)
MCH: 27.4 pg (ref 26.0–34.0)
MCHC: 31.8 g/dL (ref 30.0–36.0)
MCV: 86.2 fL (ref 80.0–100.0)
Monocytes Absolute: 0.3 10*3/uL (ref 0.1–1.0)
Monocytes Relative: 5 %
Neutro Abs: 5.7 10*3/uL (ref 1.7–7.7)
Neutrophils Relative %: 75 %
Platelets: 453 10*3/uL — ABNORMAL HIGH (ref 150–400)
RBC: 4.56 MIL/uL (ref 3.87–5.11)
RDW: 13.7 % (ref 11.5–15.5)
WBC: 7.6 10*3/uL (ref 4.0–10.5)
nRBC: 0.3 % — ABNORMAL HIGH (ref 0.0–0.2)

## 2019-10-16 LAB — BRAIN NATRIURETIC PEPTIDE: B Natriuretic Peptide: 51.2 pg/mL (ref 0.0–100.0)

## 2019-10-16 LAB — GLUCOSE, CAPILLARY
Glucose-Capillary: 89 mg/dL (ref 70–99)
Glucose-Capillary: 128 mg/dL — ABNORMAL HIGH (ref 70–99)
Glucose-Capillary: 323 mg/dL — ABNORMAL HIGH (ref 70–99)
Glucose-Capillary: 265 mg/dL — ABNORMAL HIGH (ref 70–99)

## 2019-10-16 LAB — C-REACTIVE PROTEIN: CRP: 0.6 mg/dL (ref <1.0)

## 2019-10-16 LAB — COMPREHENSIVE METABOLIC PANEL
ALT: 20 U/L (ref 0–44)
AST: 24 U/L (ref 15–41)
Albumin: 2.8 g/dL — ABNORMAL LOW (ref 3.5–5.0)
Alkaline Phosphatase: 41 U/L (ref 38–126)
Anion gap: 11 (ref 5–15)
BUN: 28 mg/dL — ABNORMAL HIGH (ref 8–23)
CO2: 30 mmol/L (ref 22–32)
Calcium: 8.8 mg/dL — ABNORMAL LOW (ref 8.9–10.3)
Chloride: 95 mmol/L — ABNORMAL LOW (ref 98–111)
Creatinine, Ser: 0.7 mg/dL (ref 0.44–1.00)
GFR calc Af Amer: >60 mL/min (ref >60)
GFR calc non Af Amer: >60 mL/min (ref >60)
Glucose, Bld: 122 mg/dL — ABNORMAL HIGH (ref 70–99)
Potassium: 3.8 mmol/L (ref 3.5–5.1)
Sodium: 136 mmol/L (ref 135–145)
Total Bilirubin: 0.7 mg/dL (ref 0.3–1.2)
Total Protein: 5.8 g/dL — ABNORMAL LOW (ref 6.5–8.1)

## 2019-10-16 LAB — FERRITIN: Ferritin: 126 ng/mL (ref 11–307)

## 2019-10-16 LAB — MAGNESIUM: Magnesium: 1.7 mg/dL (ref 1.7–2.4)

## 2019-10-16 LAB — D-DIMER, QUANTITATIVE: D-Dimer, Quant: 1.61 ug/mL-FEU — ABNORMAL HIGH (ref 0.00–0.50)

## 2019-10-16 LAB — PHOSPHORUS: Phosphorus: 3.1 mg/dL (ref 2.5–4.6)

## 2019-10-16 MED ORDER — METOPROLOL SUCCINATE ER 100 MG PO TB24
100.0000 mg | ORAL_TABLET | Freq: Every day | ORAL | Status: DC
  Administered 2019-10-16: 100 mg via ORAL
  Filled 2019-10-16 (×2): qty 1

## 2019-10-16 NOTE — Progress Notes (Signed)
PROGRESS NOTE    Christina Blackburn  [id number] DOB: [birth year] DOA: 10/09/2019 PCP: Idelle Crouch, MD   Brief Narrative:  74 yo with hx of coronary artery disease from [place] peripheral arterial disease status post aortobiiliac stents, non-insulin-dependent type 2 diabetes and hypertension, who was diagnosed with COVID-19 on 09/29/2021 presents to the emergency room with Christina Blackburn complaint of increasing shortness of breath, she was diagnosed with acute hypoxic respiratory failure admitted to [facility] and then subsequently transferred to [facility] for further care.   Assessment & Plan:   Active Problems:   COVID-19 virus infection  # Acute Hypoxic Resp. Failure due to Acute Covid 19 Viral Pneumonitis  Back on 6 L this AM, wean as tolerated (weaned to RA at time of this note) Plan for home O2 screen S/p actemra 12/28 S/p remdesivir Steroids, day 10 I/O, daily weights IS, prone as able, OOB Daily labs  COVID-19 Labs  Recent Labs    10/14/19 0025 10/15/19 0130 10/16/19 0916  DDIMER 1.66* 1.30* 1.61*  FERRITIN  --  155 126  CRP 0.9 0.6 0.6    Lab Results  Component Value Date   SARSCOV2NAA DETECTED (Damany Eastman) 09/30/2019   2.  History of CAD, PAD endarterectomy along with aortic and bilateral iliac stents, continue dual antiplatelet therapy along with statin for secondary prevention.  Outpatient vascular surgery and PCP follow-up for risk factor modification.  3.  Obesity.  BMI of 34.  Follow with PCP.  4.  Hypertension.  For now Norvasc.  5.  Incidental CT finding of thyroid nodule and left subclavian stenosis.  Follow with PCP for age-appropriate follow-up.    6. Non-insulin-dependent DM type II.  Continue levemir, mealtime, SSI  DVT prophylaxis: lovenox Code Status: full Family Communication: none at bedside Disposition Plan: pending further improvement  Consultants:   none  Procedures:   none  Antimicrobials:  Anti-infectives (From admission, onward)   Start     Dose/Rate Route Frequency Ordered Stop   10/10/19 1000  remdesivir 100 mg in sodium chloride 0.9 % 100 mL IVPB  Status:  Discontinued     100 mg 200 mL/hr over 30 Minutes Intravenous Daily 10/09/19 1109 10/09/19 1137   10/09/19 1145  remdesivir 100 mg in sodium chloride 0.9 % 100 mL IVPB     100 mg 200 mL/hr over 30 Minutes Intravenous Daily 10/09/19 1137 10/11/19 0906   10/09/19 1115  remdesivir 200 mg in sodium chloride 0.9% 250 mL IVPB  Status:  Discontinued     200 mg 580 mL/hr over 30 Minutes Intravenous Once 10/09/19 1109 10/09/19 1137     Subjective: No new complaints  Objective: Vitals:   10/16/19 1013 10/16/19 1145 10/16/19 1218 10/16/19 1541  BP: (!) 146/72   132/71  Pulse:    (!) 103  Resp:    17  Temp:    99.1 F (37.3 C)  TempSrc:    Oral  SpO2:  96% 100% 92%  Weight:      Height:        Intake/Output Summary (Last 24 hours) at 10/16/2019 1546 Last data filed at 10/16/2019 1141 Gross per 24 hour  Intake 460 ml  Output --  Net 460 ml   Filed Weights   10/09/19 1028 10/09/19 1714  Weight: 74.6 kg 74.6 kg    Examination:  General: No acute distress. Cardiovascular: RRR Lungs: Clear to auscultation bilaterally Abdomen: Soft, nontender, nondistended Neurological: Alert and oriented 3. Moves all extremities 4. Cranial nerves II through  XII grossly intact. Skin: Warm and dry. No rashes or lesions. Extremities: No clubbing or cyanosis. No edema.   Data Reviewed: I have personally reviewed following labs and imaging studies  CBC: Recent Labs  Lab 10/12/19 0452 10/13/19 0250 10/14/19 0025 10/15/19 0130 10/16/19 0916  WBC 6.3 7.7 9.6 8.5 7.6  NEUTROABS 5.0 6.5 8.2* 6.8 5.7  HGB 12.0 12.4 12.9 12.7 12.5  HCT 37.7 38.4 40.0 40.0 39.3  MCV 85.5 85.5 86.8 86.2 86.2  PLT 437* 428* 488* 469* 0000000*   Basic Metabolic Panel: Recent Labs  Lab 10/12/19 0452 10/13/19 0250 10/14/19 0025 10/15/19 0130 10/16/19 0916  NA 136 136 133* 133* 136  K  4.3 4.8 4.9 4.5 3.8  CL 92* 94* 91* 92* 95*  CO2 31 32 31 30 30   GLUCOSE 167* 171* 228* 148* 122*  BUN 63* 60* 53* 40* 28*  CREATININE 0.77 0.74 0.70 0.68 0.70  CALCIUM 9.2 9.2 9.3 8.9 8.8*  MG 1.8 2.0 2.0 1.8 1.7  PHOS  --   --   --   --  3.1   GFR: Estimated Creatinine Clearance: 56.8 mL/min (by C-G formula based on SCr of 0.7 mg/dL). Liver Function Tests: Recent Labs  Lab 10/12/19 0452 10/13/19 0250 10/14/19 0025 10/15/19 0130 10/16/19 0916  AST 20 22 25 20 24   ALT 16 19 20 22 20   ALKPHOS 54 51 53 50 41  BILITOT 0.5 1.0 0.7 1.1 0.7  PROT 6.6 6.4* 6.8 6.0* 5.8*  ALBUMIN 2.7* 2.9* 2.9* 2.9* 2.8*   No results for input(s): LIPASE, AMYLASE in the last 168 hours. No results for input(s): AMMONIA in the last 168 hours. Coagulation Profile: No results for input(s): INR, PROTIME in the last 168 hours. Cardiac Enzymes: No results for input(s): CKTOTAL, CKMB, CKMBINDEX, TROPONINI in the last 168 hours. BNP (last 3 results) No results for input(s): PROBNP in the last 8760 hours. HbA1C: No results for input(s): HGBA1C in the last 72 hours. CBG: Recent Labs  Lab 10/15/19 1146 10/15/19 1624 10/15/19 2057 10/16/19 0807 10/16/19 1154  GLUCAP 173* 219* 293* 89 128*   Lipid Profile: No results for input(s): CHOL, HDL, LDLCALC, TRIG, CHOLHDL, LDLDIRECT in the last 72 hours. Thyroid Function Tests: No results for input(s): TSH, T4TOTAL, FREET4, T3FREE, THYROIDAB in the last 72 hours. Anemia Panel: Recent Labs    10/15/19 0130 10/16/19 0916  FERRITIN 155 126   Sepsis Labs: No results for input(s): PROCALCITON, LATICACIDVEN in the last 168 hours.  No results found for this or any previous visit (from the past 240 hour(s)).       Radiology Studies: No results found.      Scheduled Meds: . amLODipine  10 mg Oral Daily  . ascorbic acid  500 mg Oral Daily  . aspirin  81 mg Oral Daily  . atorvastatin  20 mg Oral Daily  . cholecalciferol  1,000 Units Oral Daily    . clopidogrel  75 mg Oral Daily  . DULoxetine  30 mg Oral Daily  . enoxaparin (LOVENOX) injection  40 mg Subcutaneous Q24H  . fenofibrate  160 mg Oral Daily  . gabapentin  200 mg Oral TID  . insulin aspart  0-20 Units Subcutaneous TID WC  . insulin aspart  0-5 Units Subcutaneous QHS  . insulin aspart  10 Units Subcutaneous TID WC  . insulin detemir  15 Units Subcutaneous QHS  . magnesium oxide  400 mg Oral Daily  . multivitamin with minerals  1 tablet Oral  Daily  . omega-3 acid ethyl esters  1 g Oral BID  . pantoprazole  40 mg Oral Daily  . sodium chloride flush  3 mL Intravenous Q12H  . vitamin B-12  1,000 mcg Oral Daily  . zinc sulfate  220 mg Oral Daily   Continuous Infusions:   LOS: 7 days    Time spent: over 30 min    Fayrene Helper, MD [facility] Pager AMION  If 7PM-7AM, please contact night-coverage [email or web address] Password [facility] 10/16/2019, 3:46 PM

## 2019-10-16 NOTE — Progress Notes (Signed)
Images taken of pt leg ulcer. Unable to upload to rover/pt chart. Wound care consulted and paged. No response. Wound measured and cleaned. New dressing applied. Powell,MD notified

## 2019-10-16 NOTE — Progress Notes (Signed)
Pt had a pleasant night, writer intervened several times throughout the night due to pt oxygen desaturation- usually d/t [place] moving out of place. No s/s of dyspnea during the night. Offered bath tonight, pt refused at this time and reported she just wants to rest. LG bm this shift. Tolerated medications. Denied pain this shift. Pt oxygen rates have to be titrated at times rather the pt is moving or resting, but pt always able to recover with short period of time. No new issues or concerns to report at this time. Will cont to monitor.

## 2019-10-16 NOTE — Progress Notes (Signed)
Wells Guiles updated

## 2019-10-16 NOTE — Progress Notes (Addendum)
SATURATION QUALIFICATIONS: (This note is used to comply with regulatory documentation for home oxygen)  Patient Saturations on Room Air at Rest =93%  Patient Saturations on Room Air while Ambulating = 85-91%  Patient Saturations on 2 Liters of oxygen while Ambulating = 89-93%  Please briefly explain why patient needs home oxygen:  Pt went as low as 85% on RA. Was able to recover but not fully. Pt placed on 2L. O2 went as low as 88 but able to recover quickly. No c/o SOB. PT stated she felt good throughout the walk. Walked down the hall and back. HR was elevated during the walk. Ranged from 120's to 135 at the highest. Pt back in room, in the chair. HR is 110 at this time.

## 2019-10-17 LAB — CBC WITH DIFFERENTIAL/PLATELET
Abs Immature Granulocytes: 0.38 10*3/uL — ABNORMAL HIGH (ref 0.00–0.07)
Basophils Absolute: 0 10*3/uL (ref 0.0–0.1)
Basophils Relative: 0 %
Eosinophils Absolute: 0.1 10*3/uL (ref 0.0–0.5)
Eosinophils Relative: 1 %
HCT: 37.5 % (ref 36.0–46.0)
Hemoglobin: 11.9 g/dL — ABNORMAL LOW (ref 12.0–15.0)
Immature Granulocytes: 5 %
Lymphocytes Relative: 11 %
Lymphs Abs: 0.8 10*3/uL (ref 0.7–4.0)
MCH: 27.4 pg (ref 26.0–34.0)
MCHC: 31.7 g/dL (ref 30.0–36.0)
MCV: 86.4 fL (ref 80.0–100.0)
Monocytes Absolute: 0.3 10*3/uL (ref 0.1–1.0)
Monocytes Relative: 4 %
Neutro Abs: 6 10*3/uL (ref 1.7–7.7)
Neutrophils Relative %: 79 %
Platelets: 410 10*3/uL — ABNORMAL HIGH (ref 150–400)
RBC: 4.34 MIL/uL (ref 3.87–5.11)
RDW: 13.9 % (ref 11.5–15.5)
WBC: 7.6 10*3/uL (ref 4.0–10.5)
nRBC: 0.3 % — ABNORMAL HIGH (ref 0.0–0.2)

## 2019-10-17 LAB — COMPREHENSIVE METABOLIC PANEL
ALT: 19 U/L (ref 0–44)
AST: 18 U/L (ref 15–41)
Albumin: 2.8 g/dL — ABNORMAL LOW (ref 3.5–5.0)
Alkaline Phosphatase: 45 U/L (ref 38–126)
Anion gap: 11 (ref 5–15)
BUN: 29 mg/dL — ABNORMAL HIGH (ref 8–23)
CO2: 29 mmol/L (ref 22–32)
Calcium: 8.9 mg/dL (ref 8.9–10.3)
Chloride: 96 mmol/L — ABNORMAL LOW (ref 98–111)
Creatinine, Ser: 0.63 mg/dL (ref 0.44–1.00)
GFR calc Af Amer: >60 mL/min (ref >60)
GFR calc non Af Amer: >60 mL/min (ref >60)
Glucose, Bld: 126 mg/dL — ABNORMAL HIGH (ref 70–99)
Potassium: 4 mmol/L (ref 3.5–5.1)
Sodium: 136 mmol/L (ref 135–145)
Total Bilirubin: 0.7 mg/dL (ref 0.3–1.2)
Total Protein: 5.7 g/dL — ABNORMAL LOW (ref 6.5–8.1)

## 2019-10-17 LAB — C-REACTIVE PROTEIN: CRP: 0.7 mg/dL (ref <1.0)

## 2019-10-17 LAB — GLUCOSE, CAPILLARY
Glucose-Capillary: 87 mg/dL (ref 70–99)
Glucose-Capillary: 139 mg/dL — ABNORMAL HIGH (ref 70–99)

## 2019-10-17 LAB — MAGNESIUM: Magnesium: 1.8 mg/dL (ref 1.7–2.4)

## 2019-10-17 LAB — D-DIMER, QUANTITATIVE: D-Dimer, Quant: 1.28 ug/mL-FEU — ABNORMAL HIGH (ref 0.00–0.50)

## 2019-10-17 LAB — BRAIN NATRIURETIC PEPTIDE: B Natriuretic Peptide: 68.6 pg/mL (ref 0.0–100.0)

## 2019-10-17 LAB — PHOSPHORUS: Phosphorus: 3 mg/dL (ref 2.5–4.6)

## 2019-10-17 MED ORDER — OXYCODONE HCL 5 MG PO TABS
5.0000 mg | ORAL_TABLET | Freq: Once | ORAL | Status: AC | PRN
  Administered 2019-10-17: 5 mg via ORAL
  Filled 2019-10-17: qty 1

## 2019-10-17 NOTE — Discharge Instructions (Signed)
You should continue to quarantine until 10/31/19.  You can discontinue your quarantine after this date if you've had no symptoms without the use of fever reducing medications.  Please ask your PCP or the health department if you have questions.     Person Under Monitoring Name: Christina Blackburn  Location: [address] [place] [address]   Infection Prevention Recommendations for Individuals Confirmed to have, or Being Evaluated for, 2019 Novel Coronavirus (COVID-19) Infection Who Receive Care at Home  Individuals who are confirmed to have, or are being evaluated for, COVID-19 should follow the prevention steps below until [name] healthcare provider or local or state health department says they can return to normal activities.  Stay home except to get medical care You should restrict activities outside your home, except for getting medical care. Do not go to work, school, or public areas, and do not use public transportation or taxis.  Call ahead before visiting your doctor Before your medical appointment, call the healthcare provider and tell them that you have, or are being evaluated for, COVID-19 infection. This will help the healthcare providers office take steps to keep other people from getting infected. Ask your healthcare provider to call the local or state health department.  Monitor your symptoms Seek prompt medical attention if your illness is worsening (e.g., difficulty breathing). Before going to your medical appointment, call the healthcare provider and tell them that you have, or are being evaluated for, COVID-19 infection. Ask your healthcare provider to call the local or state health department.  Wear Dellie Piasecki facemask You should wear [name] facemask that covers your nose and mouth when you are in the same room with other people and when you visit Mykenzie Ebanks healthcare provider. People who live with or visit you should also wear [name] facemask while they are in the same room with  you.  Separate yourself from other people in your home As much as possible, you should stay in [name] different room from other people in your home. Also, you should use Rogina Schiano separate bathroom, if available.  Avoid sharing household items You should not share dishes, drinking glasses, cups, eating utensils, towels, bedding, or other items with other people in your home. After using these items, you should wash them thoroughly with soap and water.  Cover your coughs and sneezes Cover your mouth and nose with [name] tissue when you cough or sneeze, or you can cough or sneeze into your sleeve. Throw used tissues in [name] lined trash can, and immediately wash your hands with soap and water for at least 20 seconds or use an alcohol-based hand rub.  Wash your [name] your hands often and thoroughly with soap and water for at least 20 seconds. You can use an alcohol-based hand sanitizer if soap and water are not available and if your hands are not visibly dirty. Avoid touching your eyes, nose, and mouth with unwashed hands.   Prevention Steps for Caregivers and Household Members of Individuals Confirmed to have, or Being Evaluated for, COVID-19 Infection Being Cared for in the Home  If you live with, or provide care at home for, [name] person confirmed to have, or being evaluated for, COVID-19 infection please follow these guidelines to prevent infection:  Follow healthcare providers instructions Make sure that you understand and can help the patient follow any healthcare provider instructions for all care.  Provide for the patients basic needs You should help the patient with basic needs in the home and provide support for getting  groceries, prescriptions, and other personal needs.  Monitor the patients symptoms If they are getting sicker, call his or her medical provider and tell them that the patient has, or is being evaluated for, COVID-19 infection. This will help the healthcare providers office  take steps to keep other people from getting infected. Ask the healthcare provider to call the local or state health department.  Limit the number of people who have contact with the patient  If possible, have only one caregiver for the patient.  Other household members should stay in another home or place of residence. If this is not possible, they should stay  in another room, or be separated from the patient as much as possible. Use [name] separate bathroom, if available.  Restrict visitors who do not have an essential need to be in the home.  Keep older adults, very young children, and other sick people away from the patient Keep older adults, very young children, and those who have compromised immune systems or chronic health conditions away from the patient. This includes people with chronic heart, lung, or kidney conditions, diabetes, and cancer.  Ensure good ventilation Make sure that shared spaces in the home have good air flow, such as from an air conditioner or an opened window, weather permitting.  Wash your hands often  Wash your hands often and thoroughly with soap and water for at least 20 seconds. You can use an alcohol based hand sanitizer if soap and water are not available and if your hands are not visibly dirty.  Avoid touching your eyes, nose, and mouth with unwashed hands.  Use disposable paper towels to dry your hands. If not available, use dedicated cloth towels and replace them when they become wet.  Wear Khara Renaud facemask and gloves  Wear [name] disposable facemask at all times in the room and gloves when you touch or have contact with the patients blood, body fluids, and/or secretions or excretions, such as sweat, saliva, sputum, nasal mucus, vomit, urine, or feces.  Ensure the mask fits over your nose and mouth tightly, and do not touch it during use.  Throw out disposable facemasks and gloves after using them. Do not reuse.  Wash your hands immediately after removing  your facemask and gloves.  If your personal clothing becomes contaminated, carefully remove clothing and launder. Wash your hands after handling contaminated clothing.  Place all used disposable facemasks, gloves, and other waste in Dal Blew lined container before disposing them with other household waste.  Remove gloves and wash your hands immediately after handling these items.  Do not share dishes, glasses, or other household items with the patient  Avoid sharing household items. You should not share dishes, drinking glasses, cups, eating utensils, towels, bedding, or other items with Saw Mendenhall patient who is confirmed to have, or being evaluated for, COVID-19 infection.  After the person uses these items, you should wash them thoroughly with soap and water.  Wash laundry thoroughly  Immediately remove and wash clothes or bedding that have blood, body fluids, and/or secretions or excretions, such as sweat, saliva, sputum, nasal mucus, vomit, urine, or feces, on them.  Wear gloves when handling laundry from the patient.  Read and follow directions on labels of laundry or clothing items and detergent. In general, wash and dry with the warmest temperatures recommended on the label.  Clean all areas the individual has used often  Clean all touchable surfaces, such as counters, tabletops, doorknobs, bathroom fixtures, toilets, phones, keyboards, tablets, and bedside tables,  every day. Also, clean any surfaces that may have blood, body fluids, and/or secretions or excretions on them.  Wear gloves when cleaning surfaces the patient has come in contact with.  Use Gracia Saggese diluted bleach solution (e.g., dilute bleach with 1 part bleach and 10 parts water) or Emaan Gary household disinfectant with Kadeja Granada label that says EPA-registered for coronaviruses. To make Melida Northington bleach solution at home, add 1 tablespoon of bleach to 1 quart (4 cups) of water. For Adisa Litt larger supply, add  cup of bleach to 1 gallon (16 cups) of water.  Read labels  of cleaning products and follow recommendations provided on product labels. Labels contain instructions for safe and effective use of the cleaning product including precautions you should take when applying the product, such as wearing gloves or eye protection and making sure you have good ventilation during use of the product.  Remove gloves and wash hands immediately after cleaning.  Monitor yourself for signs and symptoms of illness Caregivers and household members are considered close contacts, should monitor their health, and will be asked to limit movement outside of the home to the extent possible. Follow the monitoring steps for close contacts listed on the symptom monitoring form.   ? If you have additional questions, contact your local health department or call the epidemiologist on call at [phone] (available 24/7). ? This guidance is subject to change. For the most up-to-date guidance from [facility], please refer to their website: [email or web address]

## 2019-10-17 NOTE — Discharge Summary (Signed)
Physician Discharge Summary  Christina Blackburn [facility] [id number] DOB: 1946/01/29 DOA: 10/09/2019  PCP: Idelle Crouch, MD  Admit date: 10/09/2019 Discharge date: 10/17/2019  Time spent: 45 minutes  Recommendations for Outpatient Follow-up:  1. Follow outpatient CBC/CMP 2. Follow outpatient CXR 3. Quarantine per [employer] guidelines 4. Follow outpatient thyroid nodule and L subclavian stenosis 5. Follow LLE wound outpatient   6. Follow long term oxygen need outpatient  Discharge Diagnoses:  Active Problems:   COVID-19 virus infection   Discharge Condition: stable  Diet recommendation: heart healthy  Filed Weights   10/09/19 1028 10/09/19 1714  Weight: 74.6 kg 74.6 kg    History of present illness:  74 yo with hx of coronary artery disease from Reese Senk peripheral arterial disease status post aortobiiliac stents, non-insulin-dependent type 2 diabetes and hypertension, who was diagnosed with COVID-19 on 09/29/2021 presents to the emergency room with Israel Wunder complaint of increasing shortness of breath, she was diagnosed with acute hypoxic respiratory failure admitted to [facility] and then subsequently transferred to [facility] for further care.  She was admitted with COVID 19 virus infection.  Treated with steroids, remdesivir, and actemra.  She's improved and was discharged on 10/17/19 with supplemental oxygen.  See below for additional details  Hospital Course:  # Acute Hypoxic Resp. Failure due to Acute Covid 19 Viral Pneumonitis  Back on 6 L this AM, wean as tolerated (weaned to RA at time of this note) Requiring 2 L with activity, discharge with home O2 S/p actemra 12/28 S/p remdesivir Steroids, day 10 I/O, daily weights IS, prone as able, OOB Daily labs  COVID-19 Labs  Recent Labs    10/15/19 0130 10/16/19 0916 10/17/19 0123  DDIMER 1.30* 1.61* 1.28*  FERRITIN 155 126  --   CRP 0.6 0.6 0.7    Lab Results  Component Value Date   SARSCOV2NAA DETECTED (Jose Corvin) 09/30/2019   .  History of CAD, PAD endarterectomy along with aortic and bilateral iliac stents, continue dual antiplatelet therapy along with statin for secondary prevention. Outpatient vascular surgery and PCP follow-up for risk factor modification.  3. Obesity. BMI of 34. Follow with PCP.  4. Hypertension. For now Norvasc.  5. Incidental CT finding of thyroid nodule and left subclavian stenosis. Follow with PCP for age-appropriate follow-up.   6. Non-insulin-dependent DM type II. Continue levemir, mealtime, SSI  Chronic LLE Wound: appreciate wound care recs, follow outpatient   Procedures: none  Consultations:  none  Discharge Exam: Vitals:   10/17/19 0718 10/17/19 1032  BP: 134/70 (!) 132/54  Pulse: 60   Resp: 16   Temp: 98.7 F (37.1 C)   SpO2: 94%    Feelign well today Looking forward to going home LLE wound overall appears about the same  General: No acute distress. Cardiovascular: Heart sounds show Hosea Hanawalt regular rate, and rhythm.  Lungs: Clear to auscultation bilaterally  Abdomen: Soft, nontender, nondistended Neurological: Alert and oriented 3. Moves all extremities 4. Cranial nerves II through XII grossly intact. Skin: Warm and dry. No rashes or lesions. Extremities: LLE wound with eschar   Discharge Instructions   Discharge Instructions    Diet - low sodium heart healthy   Complete by: As directed    Discharge instructions   Complete by: As directed    You were seen for COVID 19 pneumonia.  You've improved with steroids and remdesivir and actemra.  You'll be sent home with oxygen to use with activity and when you sleep.  Use 2 L with activity and sleep.  Please follow up with your PCP to determine how long you'll need to use this.  You should continue to quarantine until 10/31/19.  You can discontinue your quarantine after this date if you've had no symptoms without the use of fever reducing medications.  Please ask your PCP or the health department if you  have questions.  Follow up with your wound care providers for your left lower extremity wound and continue wound care as directed by your outpatient providers.  Follow up your left subclavian stenosis outpatient with vascular.    Return for new, recurrent, or worsening symptoms.  Please ask your PCP to request records from this hospitalization so they know what was done and what the next steps will be.   Increase activity slowly   Complete by: As directed    MyChart COVID-19 home monitoring program   Complete by: Oct 17, 2019    Is the patient willing to use the [place] for home monitoring?: Yes   Temperature monitoring   Complete by: Oct 17, 2019    After how many days would you like to receive Neriah Brott notification of this patient's flowsheet entries?: 1     Allergies as of 10/17/2019      Reactions   Tape Other (See Comments)   Hypoallergenic tape - blisters Blisters      Medication List    STOP taking these medications   enoxaparin 40 MG/0.4ML injection Commonly known as: LOVENOX   insulin aspart 100 UNIT/ML injection Commonly known as: novoLOG   methylPREDNISolone sodium succinate 40 mg/mL injection Commonly known as: SOLU-MEDROL     TAKE these medications   acetaminophen 325 MG tablet Commonly known as: TYLENOL Take 2 tablets (650 mg total) by mouth every 6 (six) hours as needed for mild pain or headache (fever >/= 101).   albuterol 108 (90 Base) MCG/ACT inhaler Commonly known as: VENTOLIN HFA Inhale 2 puffs into the lungs every 6 (six) hours as needed for wheezing or shortness of breath.   amLODipine 5 MG tablet Commonly known as: NORVASC Take 5 mg by mouth daily.   ascorbic acid 500 MG tablet Commonly known as: VITAMIN C Take 1 tablet (500 mg total) by mouth daily.   aspirin 81 MG EC tablet Take 1 tablet (81 mg total) by mouth daily.   atorvastatin 20 MG tablet Commonly known as: LIPITOR Take 20 mg by mouth daily.   CALCIUM 600+D PO Take 1  tablet by mouth 2 (two) times daily.   cholecalciferol 25 MCG (1000 UT) tablet Commonly known as: VITAMIN D Take 1 tablet (1,000 Units total) by mouth daily.   clopidogrel 75 MG tablet Commonly known as: PLAVIX Take 1 tablet (75 mg total) by mouth daily.   [employer] SLEEP AID PO Take 2 capsules by mouth at bedtime as needed (sleep).   DULoxetine 30 MG capsule Commonly known as: CYMBALTA Take 1 capsule (30 mg total) by mouth daily.   feeding supplement (ENSURE ENLIVE) Liqd Take 237 mLs by mouth 3 (three) times daily between meals.   fenofibrate 160 MG tablet Take 1 tablet (160 mg total) by mouth daily.   fish oil-omega-3 fatty acids 1000 MG capsule Take 1 g by mouth daily.   furosemide 20 MG tablet Commonly known as: LASIX Take 20 mg by mouth daily as needed for fluid.   gabapentin 100 MG capsule Commonly known as: NEURONTIN Take 2 capsules (200 mg total) by mouth 3 (three) times daily.   glimepiride 4 MG tablet  Commonly known as: AMARYL Take 4 mg by mouth daily with breakfast.   guaiFENesin-dextromethorphan 100-10 MG/5ML syrup Commonly known as: ROBITUSSIN DM Take 10 mLs by mouth every 4 (four) hours as needed for cough.   losartan-hydrochlorothiazide 100-25 MG tablet Commonly known as: HYZAAR Take 1 tablet by mouth daily.   magnesium oxide 400 MG tablet Commonly known as: MAG-OX Take 400 mg by mouth daily.   metFORMIN 1000 MG tablet Commonly known as: GLUCOPHAGE Take 1,000 mg by mouth 2 (two) times daily with Antionette Luster meal.   metoprolol succinate 100 MG 24 hr tablet Commonly known as: TOPROL-XL Take 1 tablet (100 mg total) by mouth daily. Take with or immediately following Junaid Wurzer meal.   multivitamin with minerals Tabs tablet Take 1 tablet by mouth daily.   senna-docusate 8.6-50 MG tablet Commonly known as: Senokot-S Take 1 tablet by mouth at bedtime as needed for mild constipation.   TURMERIC PO Take 1 capsule by mouth daily.   vitamin B-12 1000 MCG  tablet Commonly known as: CYANOCOBALAMIN Take 1,000 mcg by mouth daily.   vitamin E 400 UNIT capsule Take 400 Units by mouth daily.   zinc sulfate 220 (50 Zn) MG capsule Take 1 capsule (220 mg total) by mouth daily.            Durable Medical Equipment  (From admission, onward)         Start     Ordered   10/16/19 1809  DME Oxygen  Once    Comments: SATURATION QUALIFICATIONS: (This note is used to comply with regulatory documentation for home oxygen)  Patient Saturations on Room Air at Rest =93%  Patient Saturations on Room Air while Ambulating = 85-91%  Patient Saturations on 2 Liters of oxygen while Ambulating = 89-93%  Please briefly explain why patient needs home oxygen:  Pt went as low as 85% on RA. Was able to recover but not fully. Pt placed on 2L. O2 went as low as 88 but able to recover quickly. No c/o SOB. PT stated she felt good throughout the walk. Walked down the hall and back. HR was elevated during the walk. Ranged from 120's to 135 at the highest. Pt back in room, in the chair. HR is 110 at this time.  Question Answer Comment  Length of Need 12 Months   Liters per Minute 2   Frequency Continuous (stationary and portable oxygen unit needed)   Oxygen conserving device Yes   Oxygen delivery system Gas      10/16/19 1808         Allergies  Allergen Reactions  . Tape Other (See Comments)    Hypoallergenic tape - blisters Blisters      The results of significant diagnostics from this hospitalization (including imaging, microbiology, ancillary and laboratory) are listed below for reference.    Significant Diagnostic Studies: CT ANGIO CHEST PE W OR WO CONTRAST  Result Date: 10/11/2019 CLINICAL DATA:  74 year old with COVID-19 pneumonia, with acute worsening of shortness of breath. EXAM: CT ANGIOGRAPHY CHEST WITH CONTRAST TECHNIQUE: Multidetector CT imaging of the chest was performed using the standard protocol during bolus administration of  intravenous contrast. Multiplanar CT image reconstructions and MIPs were obtained to evaluate the vascular anatomy. CONTRAST:  92mL OMNIPAQUE IOHEXOL 350 MG/ML IV. COMPARISON:  No prior CT chest. FINDINGS: Cardiovascular: Contrast opacification of pulmonary arteries is good. No filling defects within either main pulmonary artery or their segmental branches in either lung to suggest pulmonary embolism. Heart size upper normal  to slightly enlarged. Severe three-vessel coronary atherosclerosis. Dense mitral annular calcification no pericardial effusion. Prominent epicardial fat. Severe atherosclerosis involving the thoracic and proximal abdominal aorta without evidence of aneurysm. Severe atherosclerosis at the origin of the LEFT subclavian artery with calcified plaque and possible hemodynamically significant stenosis. Severe atherosclerosis at the origins of the celiac and SMA with possible borderline hemodynamically significant stenoses. Mediastinum/Nodes: No pathologically enlarged mediastinal, hilar or axillary lymph nodes. No mediastinal masses. Normal-appearing esophagus. Asymmetric enlargement of the RIGHT lobe of the thyroid gland with multiple nodules, the largest approximating 1.3 cm. Lungs/Pleura: Ground-glass airspace opacities diffusely throughout both lungs, confirming the given diagnosis of COVID-19 viral pneumonia. No confluent airspace opacities in either lung. No parenchymal nodules or masses. No pleural effusions. Central airways patent without significant bronchial wall thickening. Upper Abdomen: Surgically absent gallbladder. Mild enlargement of the LEFT adrenal gland without nodularity. Visualized upper abdomen otherwise unremarkable for the early arterial phase of enhancement which accounts for the heterogeneous appearance of the spleen. Musculoskeletal: Prior RIGHT shoulder arthroplasty with anatomic alignment. Severe degenerative disc disease and spondylosis throughout the thoracic spine. No  acute findings. Review of the MIP images confirms the above findings. IMPRESSION: 1. No evidence of pulmonary embolism. 2. Ground-glass airspace opacities diffusely throughout both lungs, confirming the given diagnosis of COVID-19 viral pneumonia. 3. Asymmetric enlargement of the RIGHT lobe of the thyroid gland with multiple nodules, the largest approximating 1.3 cm. No imaging followup is needed.(Ref: J Am Coll Radiol. 2015 Feb;12(2): 143-50). 4. Severe three-vessel coronary atherosclerosis. 5. Possible hemodynamically significant stenosis involving the LEFT subclavian artery. Does the patient have symptoms related to LEFT UPPER EXTREMITY claudication? Aortic Atherosclerosis (ICD10-I70.0). Electronically Signed   By: Evangeline Dakin M.D.   On: 10/11/2019 12:40   DG Chest Port 1 View  Result Date: 10/11/2019 CLINICAL DATA:  Shortness of breath EXAM: PORTABLE CHEST 1 VIEW COMPARISON:  October 07, 2019 FINDINGS: The mediastinal contour and cardiac silhouette are stable. Heart size is enlarged. Patchy ground-glass opacities identified throughout both lungs slightly improved compared prior exam. There is no pleural effusion. Bony structures are stable. IMPRESSION: Patchy ground-glass opacities identified throughout both lungs slightly improved compared prior exam. This could be due to underlying pneumonia. Electronically Signed   By: Abelardo Diesel M.D.   On: 10/11/2019 09:09   DG Chest Portable 1 View  Result Date: 10/07/2019 CLINICAL DATA:  COVID positive EXAM: PORTABLE CHEST 1 VIEW COMPARISON:  February 09, 2016 FINDINGS: There is mild cardiomegaly. Extensive patchy/of the ground-glass opacities are seen throughout both lungs minimally within the mid to lower lungs. No pleural effusion is seen. No acute osseous abnormality. The patient is status post right total shoulder arthroplasty. IMPRESSION: Extensive bilateral ground-glass opacities, consistent with multifocal viral pneumonia. Electronically Signed    By: Prudencio Pair M.D.   On: 10/07/2019 19:01   MM 3D SCREEN BREAST BILATERAL  Result Date: 09/25/2019 CLINICAL DATA:  Screening. EXAM: DIGITAL SCREENING BILATERAL MAMMOGRAM WITH TOMO AND CAD COMPARISON:  Previous exam(s). ACR Breast Density Category b: There are scattered areas of fibroglandular density. FINDINGS: In the left breast, Vu Liebman possible mass warrants further evaluation. This possible mass is seen within the upper inner quadrant of the LEFT breast, CC slice 22 and MLO slice 21, suspected fat necrosis. In the right breast, no findings suspicious for malignancy. Images were processed with CAD. IMPRESSION: Further evaluation is suggested for possible mass in the left breast. RECOMMENDATION: Diagnostic mammogram and possibly ultrasound of the left breast. (Code:[id number]) The patient will  be contacted regarding the findings, and additional imaging will be scheduled. BI-RADS CATEGORY  0: Incomplete. Need additional imaging evaluation and/or prior mammograms for comparison. Electronically Signed   By: Franki Cabot M.D.   On: 09/25/2019 11:16   ABI  Result Date: 09/28/2019 LOWER EXTREMITY DOPPLER STUDY Indications: Claudication, and ulceration. High Risk Factors: Hypertension.  Vascular Interventions: 08/05/2018: Abdominal Aortogram, Lt Lower Extremity                         distal [place]. Comparison Study: 03/23/2019 Performing Technologist: Charlane Ferretti RT (R)(VS)  Examination Guidelines: Woodie Degraffenreid complete evaluation includes at minimum, Doppler waveform signals and systolic blood pressure reading at the level of bilateral brachial, anterior tibial, and posterior tibial arteries, when vessel segments are accessible. Bilateral testing is considered an integral part of [address] complete examination. Photoelectric Plethysmograph (PPG) waveforms and toe systolic pressure readings are included as required and additional duplex testing as needed. Limited examinations for reoccurring indications may be performed as noted.   ABI Findings: +---------+------------------+-----+----------+----------------+ Right    Rt Pressure (mmHg)IndexWaveform  Comment          +---------+------------------+-----+----------+----------------+ Brachial 209                                               +---------+------------------+-----+----------+----------------+ ATA      158               0.74 monophasic                 +---------+------------------+-----+----------+----------------+ PTA                             monophasicNon compressable +---------+------------------+-----+----------+----------------+ Great Toe104               0.49 Abnormal                   +---------+------------------+-----+----------+----------------+ +---------+------------------+-----+----------+----------------+ Left     Lt Pressure (mmHg)IndexWaveform  Comment          +---------+------------------+-----+----------+----------------+ Brachial 213                                               +---------+------------------+-----+----------+----------------+ ATA      136               0.64 monophasic                 +---------+------------------+-----+----------+----------------+ PTA                             monophasicNon compressable +---------+------------------+-----+----------+----------------+ Great Toe49                0.23 Abnormal                   +---------+------------------+-----+----------+----------------+ +-------+-----------+-----------+------------+------------+ ABI/TBIToday's ABIToday's TBIPrevious ABIPrevious TBI +-------+-----------+-----------+------------+------------+ Right  .74        .49        [place]          .35          +-------+-----------+-----------+------------+------------+ Left   .64        .23  [place]          .23          +-------+-----------+-----------+------------+------------+ Bilateral ABIs appear essentially unchanged compared to prior study on 03/23/2019.  Bilateral TBIs appear essentially unchanged compared to prior study on 03/23/2019. Summary: Right: Resting right ankle-brachial index indicates moderate right lower extremity arterial disease. ABIs are unreliable. Right posterior tibial artery is non compressable suggesting medial calcification. Left: Resting left ankle-brachial index indicates moderate left lower extremity arterial disease. ABIs are unreliable. Left posterior tibial artery is non compressable suggesting medial calcification.  *See table(s) above for measurements and observations.  Electronically signed by [name] MD on 09/28/2019 at 5:15:25 PM.    Final     Microbiology: No results found for this or any previous visit (from the past 240 hour(s)).   Labs: Basic Metabolic Panel: Recent Labs  Lab 10/13/19 0250 10/14/19 0025 10/15/19 0130 10/16/19 0916 10/17/19 0123  NA 136 133* 133* 136 136  K 4.8 4.9 4.5 3.8 4.0  CL 94* 91* 92* 95* 96*  CO2 32 31 30 30 29   GLUCOSE 171* 228* 148* 122* 126*  BUN 60* 53* 40* 28* 29*  CREATININE 0.74 0.70 0.68 0.70 0.63  CALCIUM 9.2 9.3 8.9 8.8* 8.9  MG 2.0 2.0 1.8 1.7 1.8  PHOS  --   --   --  3.1 3.0   Liver Function Tests: Recent Labs  Lab 10/13/19 0250 10/14/19 0025 10/15/19 0130 10/16/19 0916 10/17/19 0123  AST 22 25 20 24 18   ALT 19 20 22 20 19   ALKPHOS 51 53 50 41 45  BILITOT 1.0 0.7 1.1 0.7 0.7  PROT 6.4* 6.8 6.0* 5.8* 5.7*  ALBUMIN 2.9* 2.9* 2.9* 2.8* 2.8*   No results for input(s): LIPASE, AMYLASE in the last 168 hours. No results for input(s): AMMONIA in the last 168 hours. CBC: Recent Labs  Lab 10/13/19 0250 10/14/19 0025 10/15/19 0130 10/16/19 0916 10/17/19 0123  WBC 7.7 9.6 8.5 7.6 7.6  NEUTROABS 6.5 8.2* 6.8 5.7 6.0  HGB 12.4 12.9 12.7 12.5 11.9*  HCT 38.4 40.0 40.0 39.3 37.5  MCV 85.5 86.8 86.2 86.2 86.4  PLT 428* 488* 469* 453* 410*   Cardiac Enzymes: No results for input(s): CKTOTAL, CKMB, CKMBINDEX, TROPONINI in the last 168  hours. BNP: BNP (last 3 results) Recent Labs    10/15/19 0130 10/16/19 0916 10/17/19 0123  BNP 50.5 51.2 68.6    ProBNP (last 3 results) No results for input(s): PROBNP in the last 8760 hours.  CBG: Recent Labs  Lab 10/16/19 1154 10/16/19 1653 10/16/19 2114 10/17/19 0838 10/17/19 1148  GLUCAP 128* 323* 265* 87 139*       Signed:  Fayrene Helper MD.  [facility] 10/17/2019, 7:06 PM

## 2019-10-17 NOTE — Plan of Care (Signed)
  Problem: Education: Goal: Knowledge of risk factors and measures for prevention of condition will improve Outcome: Progressing   Problem: Coping: Goal: Psychosocial and spiritual needs will be supported Outcome: Progressing   Problem: Respiratory: Goal: Will maintain a patent airway Outcome: Progressing Goal: Complications related to the disease process, condition or treatment will be avoided or minimized Outcome: Progressing   

## 2019-10-17 NOTE — Progress Notes (Signed)
Pt d/c home. Son came to pick up pt. Pt educated on home care, O2 needs and wound changes. Pt verbalized understanding. IV removed. All belongings returned to pt.

## 2019-10-17 NOTE — TOC Transition Note (Signed)
Transition of Care [facility]) - CM/SW Discharge Note   Patient Details  Name: Christina Blackburn MRN: [id number] Date of Birth: August 29, 1946  [facility] CM/SW Contact:  Ninfa Meeker, RN Phone Number: [phone] (working remotely) 10/17/2019, 11:34 AM   Clinical Narrative:  Case manager spoke with patient via telephone to discuss discharge needs. Discussed option for agencies with patient. Patient has no preference wants someone that is in network with her insurance. Referral for Home Health services called to [facility], [facility]. Case manager called [employer] to arrange for oxygen to be delivered to patient's home. She states her son, Blenda Rawles, [phone] will be available to receive concentrator and tanks. Case manager faxed orders, saturations and demographics to [phone]. Case manager contacted Shearon Balo at [facility] and requested a portable tank be taken to patient's room for discharge.     Final next level of care: [place] Barriers to Discharge: No Barriers Identified   Patient Goals and CMS Choice Patient states their goals for this hospitalization and ongoing recovery are:: get better   Choice offered to / list presented to : Patient  Discharge Placement                       Discharge Plan and Services   Discharge Planning Services: CM Consult Post Acute Care Choice: Durable Medical Equipment, Home Health          DME Arranged: Oxygen DME Agency: [employer] Date DME Agency Contacted: 10/17/19 Time DME Agency Contacted: [id number] Representative spoke with at DME Agency: answering service [place] Arranged: RN, PT [facility] Agency: [place] Date [place]: 10/17/19 Time [place] Agency Contacted: 1100 Representative spoke with at [place]: [place] Interventions     Readmission Risk Interventions Readmission Risk Prevention Plan 10/08/2019   Transportation Screening Complete  Palliative Care Screening Complete  Some recent data might be hidden

## 2019-10-17 NOTE — Consult Note (Signed)
Patient seen for consultation 10/08/19 per Lenore Manner [place] Nurse. See consult notes  Reason for Consult: DC wound care orders  Wound type: leg ulcer Pressure Injury POA: Yes  Dressing to the LE wound:  Wash LLE with soap and water. Pat dry, apply [place] Moisturizing ointment (pink and white tube in clean utility) to leg. Place as many Xeroform gauzes over the wound area as necessary to cover it.  Beginning behind the toes and going to just below the knee, spiral wrap kerlex, then a 4 inch Ace wrap in the same manner.  Change daily.  [place], [place], [place]

## 2019-10-19 DIAGNOSIS — L97821 Non-pressure chronic ulcer of other part of left lower leg limited to breakdown of skin: Secondary | ICD-10-CM | POA: Diagnosis not present

## 2019-10-19 DIAGNOSIS — L97228 Non-pressure chronic ulcer of left calf with other specified severity: Secondary | ICD-10-CM | POA: Diagnosis not present

## 2019-10-19 DIAGNOSIS — U071 COVID-19: Secondary | ICD-10-CM | POA: Diagnosis not present

## 2019-10-19 DIAGNOSIS — Z7982 Long term (current) use of aspirin: Secondary | ICD-10-CM | POA: Diagnosis not present

## 2019-10-19 DIAGNOSIS — I2510 Atherosclerotic heart disease of native coronary artery without angina pectoris: Secondary | ICD-10-CM | POA: Diagnosis not present

## 2019-10-19 DIAGNOSIS — E1151 Type 2 diabetes mellitus with diabetic peripheral angiopathy without gangrene: Secondary | ICD-10-CM | POA: Diagnosis not present

## 2019-10-19 DIAGNOSIS — Z7902 Long term (current) use of antithrombotics/antiplatelets: Secondary | ICD-10-CM | POA: Diagnosis not present

## 2019-10-19 DIAGNOSIS — J9601 Acute respiratory failure with hypoxia: Secondary | ICD-10-CM | POA: Diagnosis not present

## 2019-10-19 DIAGNOSIS — I700 Atherosclerosis of aorta: Secondary | ICD-10-CM | POA: Diagnosis not present

## 2019-10-19 DIAGNOSIS — E278 Other specified disorders of adrenal gland: Secondary | ICD-10-CM | POA: Diagnosis not present

## 2019-10-19 DIAGNOSIS — M5134 Other intervertebral disc degeneration, thoracic region: Secondary | ICD-10-CM | POA: Diagnosis not present

## 2019-10-19 DIAGNOSIS — E669 Obesity, unspecified: Secondary | ICD-10-CM | POA: Diagnosis not present

## 2019-10-19 DIAGNOSIS — I70209 Unspecified atherosclerosis of native arteries of extremities, unspecified extremity: Secondary | ICD-10-CM | POA: Diagnosis not present

## 2019-10-19 DIAGNOSIS — E042 Nontoxic multinodular goiter: Secondary | ICD-10-CM | POA: Diagnosis not present

## 2019-10-19 DIAGNOSIS — Z9181 History of falling: Secondary | ICD-10-CM | POA: Diagnosis not present

## 2019-10-19 DIAGNOSIS — Z9981 Dependence on supplemental oxygen: Secondary | ICD-10-CM | POA: Diagnosis not present

## 2019-10-19 DIAGNOSIS — Z6832 Body mass index (BMI) 32.0-32.9, adult: Secondary | ICD-10-CM | POA: Diagnosis not present

## 2019-10-19 DIAGNOSIS — J1282 Pneumonia due to coronavirus disease 2019: Secondary | ICD-10-CM | POA: Diagnosis not present

## 2019-10-19 DIAGNOSIS — Z96611 Presence of right artificial shoulder joint: Secondary | ICD-10-CM | POA: Diagnosis not present

## 2019-10-19 DIAGNOSIS — M47814 Spondylosis without myelopathy or radiculopathy, thoracic region: Secondary | ICD-10-CM | POA: Diagnosis not present

## 2019-10-19 DIAGNOSIS — Z95820 Peripheral vascular angioplasty status with implants and grafts: Secondary | ICD-10-CM | POA: Diagnosis not present

## 2019-10-19 DIAGNOSIS — Z7984 Long term (current) use of oral hypoglycemic drugs: Secondary | ICD-10-CM | POA: Diagnosis not present

## 2019-10-19 DIAGNOSIS — I119 Hypertensive heart disease without heart failure: Secondary | ICD-10-CM | POA: Diagnosis not present

## 2019-10-19 DIAGNOSIS — I872 Venous insufficiency (chronic) (peripheral): Secondary | ICD-10-CM | POA: Diagnosis not present

## 2019-10-22 ENCOUNTER — Other Ambulatory Visit: Payer: PPO

## 2019-10-22 ENCOUNTER — Ambulatory Visit: Payer: PPO

## 2019-10-22 DIAGNOSIS — E11621 Type 2 diabetes mellitus with foot ulcer: Secondary | ICD-10-CM | POA: Diagnosis not present

## 2019-10-27 DIAGNOSIS — J9601 Acute respiratory failure with hypoxia: Secondary | ICD-10-CM | POA: Diagnosis not present

## 2019-10-27 DIAGNOSIS — U071 COVID-19: Secondary | ICD-10-CM | POA: Diagnosis not present

## 2019-10-27 DIAGNOSIS — J1282 Pneumonia due to coronavirus disease 2019: Secondary | ICD-10-CM | POA: Diagnosis not present

## 2019-10-27 DIAGNOSIS — E1151 Type 2 diabetes mellitus with diabetic peripheral angiopathy without gangrene: Secondary | ICD-10-CM | POA: Diagnosis not present

## 2019-10-27 DIAGNOSIS — I872 Venous insufficiency (chronic) (peripheral): Secondary | ICD-10-CM | POA: Diagnosis not present

## 2019-10-27 DIAGNOSIS — L97821 Non-pressure chronic ulcer of other part of left lower leg limited to breakdown of skin: Secondary | ICD-10-CM | POA: Diagnosis not present

## 2019-10-27 DIAGNOSIS — L97228 Non-pressure chronic ulcer of left calf with other specified severity: Secondary | ICD-10-CM | POA: Diagnosis not present

## 2019-10-30 DIAGNOSIS — I5032 Chronic diastolic (congestive) heart failure: Secondary | ICD-10-CM | POA: Diagnosis not present

## 2019-10-30 DIAGNOSIS — I739 Peripheral vascular disease, unspecified: Secondary | ICD-10-CM | POA: Diagnosis not present

## 2019-10-30 DIAGNOSIS — L97921 Non-pressure chronic ulcer of unspecified part of left lower leg limited to breakdown of skin: Secondary | ICD-10-CM | POA: Diagnosis not present

## 2019-10-30 DIAGNOSIS — R600 Localized edema: Secondary | ICD-10-CM | POA: Diagnosis not present

## 2019-11-02 ENCOUNTER — Ambulatory Visit
Admission: RE | Admit: 2019-11-02 | Discharge: 2019-11-02 | Disposition: A | Discharge status: Home / Self Care | Payer: PPO | Source: Ambulatory Visit | Attending: Internal Medicine | Admitting: Internal Medicine

## 2019-11-02 DIAGNOSIS — S81802A Unspecified open wound, left lower leg, initial encounter: Secondary | ICD-10-CM | POA: Diagnosis not present

## 2019-11-02 DIAGNOSIS — L97228 Non-pressure chronic ulcer of left calf with other specified severity: Secondary | ICD-10-CM | POA: Diagnosis not present

## 2019-11-02 DIAGNOSIS — I872 Venous insufficiency (chronic) (peripheral): Secondary | ICD-10-CM | POA: Diagnosis not present

## 2019-11-02 DIAGNOSIS — U071 COVID-19: Secondary | ICD-10-CM | POA: Diagnosis not present

## 2019-11-02 DIAGNOSIS — Z9181 History of falling: Secondary | ICD-10-CM | POA: Diagnosis not present

## 2019-11-02 DIAGNOSIS — R928 Other abnormal and inconclusive findings on diagnostic imaging of breast: Secondary | ICD-10-CM | POA: Insufficient documentation

## 2019-11-02 DIAGNOSIS — Z7902 Long term (current) use of antithrombotics/antiplatelets: Secondary | ICD-10-CM | POA: Diagnosis not present

## 2019-11-02 DIAGNOSIS — Z96611 Presence of right artificial shoulder joint: Secondary | ICD-10-CM | POA: Diagnosis not present

## 2019-11-02 DIAGNOSIS — Z7982 Long term (current) use of aspirin: Secondary | ICD-10-CM | POA: Diagnosis not present

## 2019-11-02 DIAGNOSIS — M47814 Spondylosis without myelopathy or radiculopathy, thoracic region: Secondary | ICD-10-CM | POA: Diagnosis not present

## 2019-11-02 DIAGNOSIS — J1282 Pneumonia due to coronavirus disease 2019: Secondary | ICD-10-CM | POA: Diagnosis not present

## 2019-11-02 DIAGNOSIS — M5134 Other intervertebral disc degeneration, thoracic region: Secondary | ICD-10-CM | POA: Diagnosis not present

## 2019-11-02 DIAGNOSIS — L97821 Non-pressure chronic ulcer of other part of left lower leg limited to breakdown of skin: Secondary | ICD-10-CM | POA: Diagnosis not present

## 2019-11-02 DIAGNOSIS — N6320 Unspecified lump in the left breast, unspecified quadrant: Secondary | ICD-10-CM

## 2019-11-02 DIAGNOSIS — J9601 Acute respiratory failure with hypoxia: Secondary | ICD-10-CM | POA: Diagnosis not present

## 2019-11-02 DIAGNOSIS — E278 Other specified disorders of adrenal gland: Secondary | ICD-10-CM | POA: Diagnosis not present

## 2019-11-02 DIAGNOSIS — E669 Obesity, unspecified: Secondary | ICD-10-CM | POA: Diagnosis not present

## 2019-11-02 DIAGNOSIS — E042 Nontoxic multinodular goiter: Secondary | ICD-10-CM | POA: Diagnosis not present

## 2019-11-02 DIAGNOSIS — I70209 Unspecified atherosclerosis of native arteries of extremities, unspecified extremity: Secondary | ICD-10-CM | POA: Diagnosis not present

## 2019-11-02 DIAGNOSIS — I119 Hypertensive heart disease without heart failure: Secondary | ICD-10-CM | POA: Diagnosis not present

## 2019-11-02 DIAGNOSIS — I2510 Atherosclerotic heart disease of native coronary artery without angina pectoris: Secondary | ICD-10-CM | POA: Diagnosis not present

## 2019-11-02 DIAGNOSIS — Z9981 Dependence on supplemental oxygen: Secondary | ICD-10-CM | POA: Diagnosis not present

## 2019-11-02 DIAGNOSIS — Z7984 Long term (current) use of oral hypoglycemic drugs: Secondary | ICD-10-CM | POA: Diagnosis not present

## 2019-11-02 DIAGNOSIS — Z6832 Body mass index (BMI) 32.0-32.9, adult: Secondary | ICD-10-CM | POA: Diagnosis not present

## 2019-11-02 DIAGNOSIS — I700 Atherosclerosis of aorta: Secondary | ICD-10-CM | POA: Diagnosis not present

## 2019-11-02 DIAGNOSIS — N6489 Other specified disorders of breast: Secondary | ICD-10-CM | POA: Diagnosis not present

## 2019-11-02 DIAGNOSIS — E1151 Type 2 diabetes mellitus with diabetic peripheral angiopathy without gangrene: Secondary | ICD-10-CM | POA: Diagnosis not present

## 2019-11-02 DIAGNOSIS — Z95820 Peripheral vascular angioplasty status with implants and grafts: Secondary | ICD-10-CM | POA: Diagnosis not present

## 2019-11-04 DIAGNOSIS — S81802A Unspecified open wound, left lower leg, initial encounter: Secondary | ICD-10-CM | POA: Diagnosis not present

## 2019-11-10 DIAGNOSIS — G8929 Other chronic pain: Secondary | ICD-10-CM | POA: Diagnosis not present

## 2019-11-10 DIAGNOSIS — L97921 Non-pressure chronic ulcer of unspecified part of left lower leg limited to breakdown of skin: Secondary | ICD-10-CM | POA: Diagnosis not present

## 2019-11-10 DIAGNOSIS — I739 Peripheral vascular disease, unspecified: Secondary | ICD-10-CM | POA: Diagnosis not present

## 2019-11-10 DIAGNOSIS — M79605 Pain in left leg: Secondary | ICD-10-CM | POA: Diagnosis not present

## 2019-11-16 DIAGNOSIS — L97829 Non-pressure chronic ulcer of other part of left lower leg with unspecified severity: Secondary | ICD-10-CM | POA: Diagnosis not present

## 2019-11-16 DIAGNOSIS — I70222 Atherosclerosis of native arteries of extremities with rest pain, left leg: Secondary | ICD-10-CM | POA: Diagnosis not present

## 2019-11-16 DIAGNOSIS — Z95820 Peripheral vascular angioplasty status with implants and grafts: Secondary | ICD-10-CM | POA: Diagnosis not present

## 2019-11-16 DIAGNOSIS — I70248 Atherosclerosis of native arteries of left leg with ulceration of other part of lower left leg: Secondary | ICD-10-CM | POA: Diagnosis not present

## 2019-11-17 DIAGNOSIS — U071 COVID-19: Secondary | ICD-10-CM | POA: Diagnosis not present

## 2019-11-18 DIAGNOSIS — Z7902 Long term (current) use of antithrombotics/antiplatelets: Secondary | ICD-10-CM | POA: Diagnosis not present

## 2019-11-18 DIAGNOSIS — J1282 Pneumonia due to coronavirus disease 2019: Secondary | ICD-10-CM | POA: Diagnosis not present

## 2019-11-18 DIAGNOSIS — Z9981 Dependence on supplemental oxygen: Secondary | ICD-10-CM | POA: Diagnosis not present

## 2019-11-18 DIAGNOSIS — E1151 Type 2 diabetes mellitus with diabetic peripheral angiopathy without gangrene: Secondary | ICD-10-CM | POA: Diagnosis not present

## 2019-11-18 DIAGNOSIS — Z9181 History of falling: Secondary | ICD-10-CM | POA: Diagnosis not present

## 2019-11-18 DIAGNOSIS — L97821 Non-pressure chronic ulcer of other part of left lower leg limited to breakdown of skin: Secondary | ICD-10-CM | POA: Diagnosis not present

## 2019-11-18 DIAGNOSIS — Z96611 Presence of right artificial shoulder joint: Secondary | ICD-10-CM | POA: Diagnosis not present

## 2019-11-18 DIAGNOSIS — E042 Nontoxic multinodular goiter: Secondary | ICD-10-CM | POA: Diagnosis not present

## 2019-11-18 DIAGNOSIS — U071 COVID-19: Secondary | ICD-10-CM | POA: Diagnosis not present

## 2019-11-18 DIAGNOSIS — L97228 Non-pressure chronic ulcer of left calf with other specified severity: Secondary | ICD-10-CM | POA: Diagnosis not present

## 2019-11-18 DIAGNOSIS — E669 Obesity, unspecified: Secondary | ICD-10-CM | POA: Diagnosis not present

## 2019-11-18 DIAGNOSIS — I700 Atherosclerosis of aorta: Secondary | ICD-10-CM | POA: Diagnosis not present

## 2019-11-18 DIAGNOSIS — I119 Hypertensive heart disease without heart failure: Secondary | ICD-10-CM | POA: Diagnosis not present

## 2019-11-18 DIAGNOSIS — Z7984 Long term (current) use of oral hypoglycemic drugs: Secondary | ICD-10-CM | POA: Diagnosis not present

## 2019-11-18 DIAGNOSIS — M47814 Spondylosis without myelopathy or radiculopathy, thoracic region: Secondary | ICD-10-CM | POA: Diagnosis not present

## 2019-11-18 DIAGNOSIS — J9601 Acute respiratory failure with hypoxia: Secondary | ICD-10-CM | POA: Diagnosis not present

## 2019-11-18 DIAGNOSIS — E278 Other specified disorders of adrenal gland: Secondary | ICD-10-CM | POA: Diagnosis not present

## 2019-11-18 DIAGNOSIS — Z95820 Peripheral vascular angioplasty status with implants and grafts: Secondary | ICD-10-CM | POA: Diagnosis not present

## 2019-11-18 DIAGNOSIS — I70209 Unspecified atherosclerosis of native arteries of extremities, unspecified extremity: Secondary | ICD-10-CM | POA: Diagnosis not present

## 2019-11-18 DIAGNOSIS — Z6832 Body mass index (BMI) 32.0-32.9, adult: Secondary | ICD-10-CM | POA: Diagnosis not present

## 2019-11-18 DIAGNOSIS — Z7982 Long term (current) use of aspirin: Secondary | ICD-10-CM | POA: Diagnosis not present

## 2019-11-18 DIAGNOSIS — M5134 Other intervertebral disc degeneration, thoracic region: Secondary | ICD-10-CM | POA: Diagnosis not present

## 2019-11-18 DIAGNOSIS — I2510 Atherosclerotic heart disease of native coronary artery without angina pectoris: Secondary | ICD-10-CM | POA: Diagnosis not present

## 2019-11-18 DIAGNOSIS — I872 Venous insufficiency (chronic) (peripheral): Secondary | ICD-10-CM | POA: Diagnosis not present

## 2019-11-19 DIAGNOSIS — I739 Peripheral vascular disease, unspecified: Secondary | ICD-10-CM | POA: Diagnosis not present

## 2019-11-27 DIAGNOSIS — I890 Lymphedema, not elsewhere classified: Secondary | ICD-10-CM | POA: Diagnosis not present

## 2019-11-27 DIAGNOSIS — I10 Essential (primary) hypertension: Secondary | ICD-10-CM | POA: Diagnosis not present

## 2019-11-27 DIAGNOSIS — I052 Rheumatic mitral stenosis with insufficiency: Secondary | ICD-10-CM | POA: Diagnosis not present

## 2019-11-27 DIAGNOSIS — M7989 Other specified soft tissue disorders: Secondary | ICD-10-CM | POA: Diagnosis not present

## 2019-11-27 DIAGNOSIS — L97921 Non-pressure chronic ulcer of unspecified part of left lower leg limited to breakdown of skin: Secondary | ICD-10-CM | POA: Diagnosis not present

## 2019-11-27 DIAGNOSIS — S81802A Unspecified open wound, left lower leg, initial encounter: Secondary | ICD-10-CM | POA: Diagnosis not present

## 2019-11-27 DIAGNOSIS — I739 Peripheral vascular disease, unspecified: Secondary | ICD-10-CM | POA: Diagnosis not present

## 2019-11-27 DIAGNOSIS — R600 Localized edema: Secondary | ICD-10-CM | POA: Diagnosis not present

## 2019-11-27 DIAGNOSIS — L97221 Non-pressure chronic ulcer of left calf limited to breakdown of skin: Secondary | ICD-10-CM | POA: Diagnosis not present

## 2019-11-27 DIAGNOSIS — R011 Cardiac murmur, unspecified: Secondary | ICD-10-CM | POA: Diagnosis not present

## 2019-11-27 DIAGNOSIS — I872 Venous insufficiency (chronic) (peripheral): Secondary | ICD-10-CM | POA: Diagnosis not present

## 2019-11-30 DIAGNOSIS — M47814 Spondylosis without myelopathy or radiculopathy, thoracic region: Secondary | ICD-10-CM | POA: Diagnosis not present

## 2019-11-30 DIAGNOSIS — J1282 Pneumonia due to coronavirus disease 2019: Secondary | ICD-10-CM | POA: Diagnosis not present

## 2019-11-30 DIAGNOSIS — Z96611 Presence of right artificial shoulder joint: Secondary | ICD-10-CM | POA: Diagnosis not present

## 2019-11-30 DIAGNOSIS — J9601 Acute respiratory failure with hypoxia: Secondary | ICD-10-CM | POA: Diagnosis not present

## 2019-11-30 DIAGNOSIS — E669 Obesity, unspecified: Secondary | ICD-10-CM | POA: Diagnosis not present

## 2019-11-30 DIAGNOSIS — Z7902 Long term (current) use of antithrombotics/antiplatelets: Secondary | ICD-10-CM | POA: Diagnosis not present

## 2019-11-30 DIAGNOSIS — I2510 Atherosclerotic heart disease of native coronary artery without angina pectoris: Secondary | ICD-10-CM | POA: Diagnosis not present

## 2019-11-30 DIAGNOSIS — I872 Venous insufficiency (chronic) (peripheral): Secondary | ICD-10-CM | POA: Diagnosis not present

## 2019-11-30 DIAGNOSIS — M5134 Other intervertebral disc degeneration, thoracic region: Secondary | ICD-10-CM | POA: Diagnosis not present

## 2019-11-30 DIAGNOSIS — Z6832 Body mass index (BMI) 32.0-32.9, adult: Secondary | ICD-10-CM | POA: Diagnosis not present

## 2019-11-30 DIAGNOSIS — I119 Hypertensive heart disease without heart failure: Secondary | ICD-10-CM | POA: Diagnosis not present

## 2019-11-30 DIAGNOSIS — Z7984 Long term (current) use of oral hypoglycemic drugs: Secondary | ICD-10-CM | POA: Diagnosis not present

## 2019-11-30 DIAGNOSIS — E042 Nontoxic multinodular goiter: Secondary | ICD-10-CM | POA: Diagnosis not present

## 2019-11-30 DIAGNOSIS — E278 Other specified disorders of adrenal gland: Secondary | ICD-10-CM | POA: Diagnosis not present

## 2019-11-30 DIAGNOSIS — Z9981 Dependence on supplemental oxygen: Secondary | ICD-10-CM | POA: Diagnosis not present

## 2019-11-30 DIAGNOSIS — I70209 Unspecified atherosclerosis of native arteries of extremities, unspecified extremity: Secondary | ICD-10-CM | POA: Diagnosis not present

## 2019-11-30 DIAGNOSIS — Z95820 Peripheral vascular angioplasty status with implants and grafts: Secondary | ICD-10-CM | POA: Diagnosis not present

## 2019-11-30 DIAGNOSIS — L97821 Non-pressure chronic ulcer of other part of left lower leg limited to breakdown of skin: Secondary | ICD-10-CM | POA: Diagnosis not present

## 2019-11-30 DIAGNOSIS — Z9181 History of falling: Secondary | ICD-10-CM | POA: Diagnosis not present

## 2019-11-30 DIAGNOSIS — I700 Atherosclerosis of aorta: Secondary | ICD-10-CM | POA: Diagnosis not present

## 2019-11-30 DIAGNOSIS — E1151 Type 2 diabetes mellitus with diabetic peripheral angiopathy without gangrene: Secondary | ICD-10-CM | POA: Diagnosis not present

## 2019-11-30 DIAGNOSIS — U071 COVID-19: Secondary | ICD-10-CM | POA: Diagnosis not present

## 2019-11-30 DIAGNOSIS — L97228 Non-pressure chronic ulcer of left calf with other specified severity: Secondary | ICD-10-CM | POA: Diagnosis not present

## 2019-11-30 DIAGNOSIS — Z7982 Long term (current) use of aspirin: Secondary | ICD-10-CM | POA: Diagnosis not present

## 2019-12-03 DIAGNOSIS — M47814 Spondylosis without myelopathy or radiculopathy, thoracic region: Secondary | ICD-10-CM | POA: Diagnosis not present

## 2019-12-03 DIAGNOSIS — J1282 Pneumonia due to coronavirus disease 2019: Secondary | ICD-10-CM | POA: Diagnosis not present

## 2019-12-03 DIAGNOSIS — E669 Obesity, unspecified: Secondary | ICD-10-CM | POA: Diagnosis not present

## 2019-12-03 DIAGNOSIS — Z9981 Dependence on supplemental oxygen: Secondary | ICD-10-CM | POA: Diagnosis not present

## 2019-12-03 DIAGNOSIS — I70209 Unspecified atherosclerosis of native arteries of extremities, unspecified extremity: Secondary | ICD-10-CM | POA: Diagnosis not present

## 2019-12-03 DIAGNOSIS — E278 Other specified disorders of adrenal gland: Secondary | ICD-10-CM | POA: Diagnosis not present

## 2019-12-03 DIAGNOSIS — E1151 Type 2 diabetes mellitus with diabetic peripheral angiopathy without gangrene: Secondary | ICD-10-CM | POA: Diagnosis not present

## 2019-12-03 DIAGNOSIS — Z6832 Body mass index (BMI) 32.0-32.9, adult: Secondary | ICD-10-CM | POA: Diagnosis not present

## 2019-12-03 DIAGNOSIS — Z9181 History of falling: Secondary | ICD-10-CM | POA: Diagnosis not present

## 2019-12-03 DIAGNOSIS — J9601 Acute respiratory failure with hypoxia: Secondary | ICD-10-CM | POA: Diagnosis not present

## 2019-12-03 DIAGNOSIS — I700 Atherosclerosis of aorta: Secondary | ICD-10-CM | POA: Diagnosis not present

## 2019-12-03 DIAGNOSIS — I119 Hypertensive heart disease without heart failure: Secondary | ICD-10-CM | POA: Diagnosis not present

## 2019-12-03 DIAGNOSIS — Z95820 Peripheral vascular angioplasty status with implants and grafts: Secondary | ICD-10-CM | POA: Diagnosis not present

## 2019-12-03 DIAGNOSIS — E042 Nontoxic multinodular goiter: Secondary | ICD-10-CM | POA: Diagnosis not present

## 2019-12-03 DIAGNOSIS — Z7902 Long term (current) use of antithrombotics/antiplatelets: Secondary | ICD-10-CM | POA: Diagnosis not present

## 2019-12-03 DIAGNOSIS — L97821 Non-pressure chronic ulcer of other part of left lower leg limited to breakdown of skin: Secondary | ICD-10-CM | POA: Diagnosis not present

## 2019-12-03 DIAGNOSIS — Z7982 Long term (current) use of aspirin: Secondary | ICD-10-CM | POA: Diagnosis not present

## 2019-12-03 DIAGNOSIS — M5134 Other intervertebral disc degeneration, thoracic region: Secondary | ICD-10-CM | POA: Diagnosis not present

## 2019-12-03 DIAGNOSIS — U071 COVID-19: Secondary | ICD-10-CM | POA: Diagnosis not present

## 2019-12-03 DIAGNOSIS — I2510 Atherosclerotic heart disease of native coronary artery without angina pectoris: Secondary | ICD-10-CM | POA: Diagnosis not present

## 2019-12-03 DIAGNOSIS — L97228 Non-pressure chronic ulcer of left calf with other specified severity: Secondary | ICD-10-CM | POA: Diagnosis not present

## 2019-12-03 DIAGNOSIS — Z96611 Presence of right artificial shoulder joint: Secondary | ICD-10-CM | POA: Diagnosis not present

## 2019-12-03 DIAGNOSIS — Z7984 Long term (current) use of oral hypoglycemic drugs: Secondary | ICD-10-CM | POA: Diagnosis not present

## 2019-12-03 DIAGNOSIS — I872 Venous insufficiency (chronic) (peripheral): Secondary | ICD-10-CM | POA: Diagnosis not present

## 2019-12-04 DIAGNOSIS — I2510 Atherosclerotic heart disease of native coronary artery without angina pectoris: Secondary | ICD-10-CM | POA: Diagnosis not present

## 2019-12-04 DIAGNOSIS — R600 Localized edema: Secondary | ICD-10-CM | POA: Diagnosis not present

## 2019-12-04 DIAGNOSIS — I7789 Other specified disorders of arteries and arterioles: Secondary | ICD-10-CM | POA: Diagnosis not present

## 2019-12-04 DIAGNOSIS — M47892 Other spondylosis, cervical region: Secondary | ICD-10-CM | POA: Diagnosis not present

## 2019-12-04 DIAGNOSIS — E785 Hyperlipidemia, unspecified: Secondary | ICD-10-CM | POA: Diagnosis not present

## 2019-12-04 DIAGNOSIS — E119 Type 2 diabetes mellitus without complications: Secondary | ICD-10-CM | POA: Diagnosis not present

## 2019-12-04 DIAGNOSIS — Z87891 Personal history of nicotine dependence: Secondary | ICD-10-CM | POA: Diagnosis not present

## 2019-12-04 DIAGNOSIS — I509 Heart failure, unspecified: Secondary | ICD-10-CM | POA: Diagnosis not present

## 2019-12-04 DIAGNOSIS — L97921 Non-pressure chronic ulcer of unspecified part of left lower leg limited to breakdown of skin: Secondary | ICD-10-CM | POA: Diagnosis not present

## 2019-12-04 DIAGNOSIS — Z8616 Personal history of COVID-19: Secondary | ICD-10-CM | POA: Diagnosis not present

## 2019-12-04 DIAGNOSIS — L97929 Non-pressure chronic ulcer of unspecified part of left lower leg with unspecified severity: Secondary | ICD-10-CM | POA: Diagnosis not present

## 2019-12-04 DIAGNOSIS — E669 Obesity, unspecified: Secondary | ICD-10-CM | POA: Diagnosis not present

## 2019-12-04 DIAGNOSIS — I70249 Atherosclerosis of native arteries of left leg with ulceration of unspecified site: Secondary | ICD-10-CM | POA: Diagnosis not present

## 2019-12-04 DIAGNOSIS — I110 Hypertensive heart disease with heart failure: Secondary | ICD-10-CM | POA: Diagnosis not present

## 2019-12-04 DIAGNOSIS — Z6834 Body mass index (BMI) 34.0-34.9, adult: Secondary | ICD-10-CM | POA: Diagnosis not present

## 2019-12-11 DIAGNOSIS — I70242 Atherosclerosis of native arteries of left leg with ulceration of calf: Secondary | ICD-10-CM | POA: Diagnosis not present

## 2019-12-11 DIAGNOSIS — Z736 Limitation of activities due to disability: Secondary | ICD-10-CM | POA: Diagnosis not present

## 2019-12-11 DIAGNOSIS — I5032 Chronic diastolic (congestive) heart failure: Secondary | ICD-10-CM | POA: Diagnosis not present

## 2019-12-11 DIAGNOSIS — M6281 Muscle weakness (generalized): Secondary | ICD-10-CM | POA: Diagnosis not present

## 2019-12-11 DIAGNOSIS — I110 Hypertensive heart disease with heart failure: Secondary | ICD-10-CM | POA: Diagnosis not present

## 2019-12-11 DIAGNOSIS — Z8616 Personal history of COVID-19: Secondary | ICD-10-CM | POA: Diagnosis not present

## 2019-12-11 DIAGNOSIS — E785 Hyperlipidemia, unspecified: Secondary | ICD-10-CM | POA: Diagnosis not present

## 2019-12-11 DIAGNOSIS — Z87891 Personal history of nicotine dependence: Secondary | ICD-10-CM | POA: Diagnosis not present

## 2019-12-11 DIAGNOSIS — I998 Other disorder of circulatory system: Secondary | ICD-10-CM | POA: Diagnosis not present

## 2019-12-11 DIAGNOSIS — R2689 Other abnormalities of gait and mobility: Secondary | ICD-10-CM | POA: Diagnosis not present

## 2019-12-11 DIAGNOSIS — Z743 Need for continuous supervision: Secondary | ICD-10-CM | POA: Diagnosis not present

## 2019-12-11 DIAGNOSIS — Z9981 Dependence on supplemental oxygen: Secondary | ICD-10-CM | POA: Diagnosis not present

## 2019-12-11 DIAGNOSIS — R278 Other lack of coordination: Secondary | ICD-10-CM | POA: Diagnosis not present

## 2019-12-11 DIAGNOSIS — Z7902 Long term (current) use of antithrombotics/antiplatelets: Secondary | ICD-10-CM | POA: Diagnosis not present

## 2019-12-11 DIAGNOSIS — L97221 Non-pressure chronic ulcer of left calf limited to breakdown of skin: Secondary | ICD-10-CM | POA: Diagnosis not present

## 2019-12-11 DIAGNOSIS — I739 Peripheral vascular disease, unspecified: Secondary | ICD-10-CM | POA: Diagnosis not present

## 2019-12-11 DIAGNOSIS — Z6835 Body mass index (BMI) 35.0-35.9, adult: Secondary | ICD-10-CM | POA: Diagnosis not present

## 2019-12-11 DIAGNOSIS — E668 Other obesity: Secondary | ICD-10-CM | POA: Diagnosis not present

## 2019-12-11 DIAGNOSIS — E1151 Type 2 diabetes mellitus with diabetic peripheral angiopathy without gangrene: Secondary | ICD-10-CM | POA: Diagnosis not present

## 2019-12-11 DIAGNOSIS — Z7982 Long term (current) use of aspirin: Secondary | ICD-10-CM | POA: Diagnosis not present

## 2019-12-11 DIAGNOSIS — G8929 Other chronic pain: Secondary | ICD-10-CM | POA: Diagnosis not present

## 2019-12-11 DIAGNOSIS — I2510 Atherosclerotic heart disease of native coronary artery without angina pectoris: Secondary | ICD-10-CM | POA: Diagnosis not present

## 2019-12-11 DIAGNOSIS — L97229 Non-pressure chronic ulcer of left calf with unspecified severity: Secondary | ICD-10-CM | POA: Diagnosis not present

## 2019-12-11 DIAGNOSIS — R4189 Other symptoms and signs involving cognitive functions and awareness: Secondary | ICD-10-CM | POA: Diagnosis not present

## 2019-12-11 DIAGNOSIS — R2681 Unsteadiness on feet: Secondary | ICD-10-CM | POA: Diagnosis not present

## 2019-12-11 DIAGNOSIS — I050 Rheumatic mitral stenosis: Secondary | ICD-10-CM | POA: Diagnosis not present

## 2019-12-11 DIAGNOSIS — R1319 Other dysphagia: Secondary | ICD-10-CM | POA: Diagnosis not present

## 2019-12-11 DIAGNOSIS — Z136 Encounter for screening for cardiovascular disorders: Secondary | ICD-10-CM | POA: Diagnosis not present

## 2019-12-11 DIAGNOSIS — Z79899 Other long term (current) drug therapy: Secondary | ICD-10-CM | POA: Diagnosis not present

## 2019-12-11 DIAGNOSIS — Z7984 Long term (current) use of oral hypoglycemic drugs: Secondary | ICD-10-CM | POA: Diagnosis not present

## 2019-12-11 DIAGNOSIS — I5022 Chronic systolic (congestive) heart failure: Secondary | ICD-10-CM | POA: Diagnosis not present

## 2019-12-11 DIAGNOSIS — Z9861 Coronary angioplasty status: Secondary | ICD-10-CM | POA: Diagnosis not present

## 2019-12-11 DIAGNOSIS — Z9862 Peripheral vascular angioplasty status: Secondary | ICD-10-CM | POA: Diagnosis not present

## 2019-12-14 ENCOUNTER — Other Ambulatory Visit: Payer: Self-pay

## 2019-12-18 DIAGNOSIS — R1319 Other dysphagia: Secondary | ICD-10-CM | POA: Diagnosis not present

## 2019-12-18 DIAGNOSIS — U071 COVID-19: Secondary | ICD-10-CM | POA: Diagnosis not present

## 2019-12-18 DIAGNOSIS — M6281 Muscle weakness (generalized): Secondary | ICD-10-CM | POA: Diagnosis not present

## 2019-12-18 DIAGNOSIS — Z95820 Peripheral vascular angioplasty status with implants and grafts: Secondary | ICD-10-CM | POA: Diagnosis not present

## 2019-12-18 DIAGNOSIS — R2681 Unsteadiness on feet: Secondary | ICD-10-CM | POA: Diagnosis not present

## 2019-12-18 DIAGNOSIS — I998 Other disorder of circulatory system: Secondary | ICD-10-CM | POA: Diagnosis not present

## 2019-12-18 DIAGNOSIS — R278 Other lack of coordination: Secondary | ICD-10-CM | POA: Diagnosis not present

## 2019-12-18 DIAGNOSIS — Z743 Need for continuous supervision: Secondary | ICD-10-CM | POA: Diagnosis not present

## 2019-12-18 DIAGNOSIS — L97929 Non-pressure chronic ulcer of unspecified part of left lower leg with unspecified severity: Secondary | ICD-10-CM | POA: Diagnosis not present

## 2019-12-18 DIAGNOSIS — R2689 Other abnormalities of gait and mobility: Secondary | ICD-10-CM | POA: Diagnosis not present

## 2019-12-18 DIAGNOSIS — I5030 Unspecified diastolic (congestive) heart failure: Secondary | ICD-10-CM | POA: Diagnosis not present

## 2019-12-18 DIAGNOSIS — Z736 Limitation of activities due to disability: Secondary | ICD-10-CM | POA: Diagnosis not present

## 2019-12-18 DIAGNOSIS — I739 Peripheral vascular disease, unspecified: Secondary | ICD-10-CM | POA: Diagnosis not present

## 2019-12-18 DIAGNOSIS — R4189 Other symptoms and signs involving cognitive functions and awareness: Secondary | ICD-10-CM | POA: Diagnosis not present

## 2019-12-22 DIAGNOSIS — U071 COVID-19: Secondary | ICD-10-CM | POA: Diagnosis not present

## 2019-12-22 DIAGNOSIS — Z95820 Peripheral vascular angioplasty status with implants and grafts: Secondary | ICD-10-CM | POA: Diagnosis not present

## 2019-12-22 DIAGNOSIS — I5030 Unspecified diastolic (congestive) heart failure: Secondary | ICD-10-CM | POA: Diagnosis not present

## 2019-12-22 DIAGNOSIS — I739 Peripheral vascular disease, unspecified: Secondary | ICD-10-CM | POA: Diagnosis not present

## 2019-12-22 DIAGNOSIS — L97929 Non-pressure chronic ulcer of unspecified part of left lower leg with unspecified severity: Secondary | ICD-10-CM | POA: Diagnosis not present

## 2019-12-30 DIAGNOSIS — Z8701 Personal history of pneumonia (recurrent): Secondary | ICD-10-CM | POA: Diagnosis not present

## 2019-12-30 DIAGNOSIS — Z95820 Peripheral vascular angioplasty status with implants and grafts: Secondary | ICD-10-CM | POA: Diagnosis not present

## 2019-12-30 DIAGNOSIS — Z79891 Long term (current) use of opiate analgesic: Secondary | ICD-10-CM | POA: Diagnosis not present

## 2019-12-30 DIAGNOSIS — E669 Obesity, unspecified: Secondary | ICD-10-CM | POA: Diagnosis not present

## 2019-12-30 DIAGNOSIS — Z96611 Presence of right artificial shoulder joint: Secondary | ICD-10-CM | POA: Diagnosis not present

## 2019-12-30 DIAGNOSIS — M7582 Other shoulder lesions, left shoulder: Secondary | ICD-10-CM | POA: Diagnosis not present

## 2019-12-30 DIAGNOSIS — Z7984 Long term (current) use of oral hypoglycemic drugs: Secondary | ICD-10-CM | POA: Diagnosis not present

## 2019-12-30 DIAGNOSIS — I110 Hypertensive heart disease with heart failure: Secondary | ICD-10-CM | POA: Diagnosis not present

## 2019-12-30 DIAGNOSIS — Z6832 Body mass index (BMI) 32.0-32.9, adult: Secondary | ICD-10-CM | POA: Diagnosis not present

## 2019-12-30 DIAGNOSIS — Z9981 Dependence on supplemental oxygen: Secondary | ICD-10-CM | POA: Diagnosis not present

## 2019-12-30 DIAGNOSIS — I5030 Unspecified diastolic (congestive) heart failure: Secondary | ICD-10-CM | POA: Diagnosis not present

## 2019-12-30 DIAGNOSIS — Z8616 Personal history of COVID-19: Secondary | ICD-10-CM | POA: Diagnosis not present

## 2019-12-30 DIAGNOSIS — Z7982 Long term (current) use of aspirin: Secondary | ICD-10-CM | POA: Diagnosis not present

## 2019-12-30 DIAGNOSIS — Z48812 Encounter for surgical aftercare following surgery on the circulatory system: Secondary | ICD-10-CM | POA: Diagnosis not present

## 2019-12-30 DIAGNOSIS — J9601 Acute respiratory failure with hypoxia: Secondary | ICD-10-CM | POA: Diagnosis not present

## 2019-12-30 DIAGNOSIS — E785 Hyperlipidemia, unspecified: Secondary | ICD-10-CM | POA: Diagnosis not present

## 2019-12-30 DIAGNOSIS — E1151 Type 2 diabetes mellitus with diabetic peripheral angiopathy without gangrene: Secondary | ICD-10-CM | POA: Diagnosis not present

## 2019-12-30 DIAGNOSIS — I2510 Atherosclerotic heart disease of native coronary artery without angina pectoris: Secondary | ICD-10-CM | POA: Diagnosis not present

## 2019-12-30 DIAGNOSIS — I050 Rheumatic mitral stenosis: Secondary | ICD-10-CM | POA: Diagnosis not present

## 2019-12-30 DIAGNOSIS — Z955 Presence of coronary angioplasty implant and graft: Secondary | ICD-10-CM | POA: Diagnosis not present

## 2019-12-30 DIAGNOSIS — M47812 Spondylosis without myelopathy or radiculopathy, cervical region: Secondary | ICD-10-CM | POA: Diagnosis not present

## 2019-12-30 DIAGNOSIS — L97221 Non-pressure chronic ulcer of left calf limited to breakdown of skin: Secondary | ICD-10-CM | POA: Diagnosis not present

## 2019-12-30 DIAGNOSIS — I0981 Rheumatic heart failure: Secondary | ICD-10-CM | POA: Diagnosis not present

## 2019-12-30 DIAGNOSIS — I70242 Atherosclerosis of native arteries of left leg with ulceration of calf: Secondary | ICD-10-CM | POA: Diagnosis not present

## 2019-12-30 DIAGNOSIS — Z7902 Long term (current) use of antithrombotics/antiplatelets: Secondary | ICD-10-CM | POA: Diagnosis not present

## 2020-01-01 DIAGNOSIS — E119 Type 2 diabetes mellitus without complications: Secondary | ICD-10-CM | POA: Diagnosis not present

## 2020-01-01 DIAGNOSIS — M79605 Pain in left leg: Secondary | ICD-10-CM | POA: Diagnosis not present

## 2020-01-01 DIAGNOSIS — G8929 Other chronic pain: Secondary | ICD-10-CM | POA: Diagnosis not present

## 2020-01-01 DIAGNOSIS — I10 Essential (primary) hypertension: Secondary | ICD-10-CM | POA: Diagnosis not present

## 2020-01-01 DIAGNOSIS — I739 Peripheral vascular disease, unspecified: Secondary | ICD-10-CM | POA: Diagnosis not present

## 2020-01-01 DIAGNOSIS — I2510 Atherosclerotic heart disease of native coronary artery without angina pectoris: Secondary | ICD-10-CM | POA: Diagnosis not present

## 2020-01-07 DIAGNOSIS — I739 Peripheral vascular disease, unspecified: Secondary | ICD-10-CM | POA: Diagnosis not present

## 2020-01-08 DIAGNOSIS — S81802A Unspecified open wound, left lower leg, initial encounter: Secondary | ICD-10-CM | POA: Diagnosis not present

## 2020-01-14 DIAGNOSIS — R600 Localized edema: Secondary | ICD-10-CM | POA: Diagnosis not present

## 2020-01-14 DIAGNOSIS — E1151 Type 2 diabetes mellitus with diabetic peripheral angiopathy without gangrene: Secondary | ICD-10-CM | POA: Diagnosis not present

## 2020-01-14 DIAGNOSIS — Z7902 Long term (current) use of antithrombotics/antiplatelets: Secondary | ICD-10-CM | POA: Diagnosis not present

## 2020-01-14 DIAGNOSIS — Z79899 Other long term (current) drug therapy: Secondary | ICD-10-CM | POA: Diagnosis not present

## 2020-01-14 DIAGNOSIS — I739 Peripheral vascular disease, unspecified: Secondary | ICD-10-CM | POA: Diagnosis not present

## 2020-01-14 DIAGNOSIS — T8141XA Infection following a procedure, superficial incisional surgical site, initial encounter: Secondary | ICD-10-CM | POA: Diagnosis not present

## 2020-01-14 DIAGNOSIS — S81829A Laceration with foreign body, unspecified lower leg, initial encounter: Secondary | ICD-10-CM | POA: Diagnosis not present

## 2020-01-14 DIAGNOSIS — Z95828 Presence of other vascular implants and grafts: Secondary | ICD-10-CM | POA: Diagnosis not present

## 2020-01-14 DIAGNOSIS — I110 Hypertensive heart disease with heart failure: Secondary | ICD-10-CM | POA: Diagnosis not present

## 2020-01-14 DIAGNOSIS — Z801 Family history of malignant neoplasm of trachea, bronchus and lung: Secondary | ICD-10-CM | POA: Diagnosis not present

## 2020-01-14 DIAGNOSIS — Z8249 Family history of ischemic heart disease and other diseases of the circulatory system: Secondary | ICD-10-CM | POA: Diagnosis not present

## 2020-01-14 DIAGNOSIS — Z823 Family history of stroke: Secondary | ICD-10-CM | POA: Diagnosis not present

## 2020-01-14 DIAGNOSIS — L97921 Non-pressure chronic ulcer of unspecified part of left lower leg limited to breakdown of skin: Secondary | ICD-10-CM | POA: Diagnosis not present

## 2020-01-14 DIAGNOSIS — I2510 Atherosclerotic heart disease of native coronary artery without angina pectoris: Secondary | ICD-10-CM | POA: Diagnosis not present

## 2020-01-14 DIAGNOSIS — T8131XA Disruption of external operation (surgical) wound, not elsewhere classified, initial encounter: Secondary | ICD-10-CM | POA: Diagnosis not present

## 2020-01-14 DIAGNOSIS — E785 Hyperlipidemia, unspecified: Secondary | ICD-10-CM | POA: Diagnosis not present

## 2020-01-14 DIAGNOSIS — Z841 Family history of disorders of kidney and ureter: Secondary | ICD-10-CM | POA: Diagnosis not present

## 2020-01-14 DIAGNOSIS — Z7982 Long term (current) use of aspirin: Secondary | ICD-10-CM | POA: Diagnosis not present

## 2020-01-14 DIAGNOSIS — Z20822 Contact with and (suspected) exposure to covid-19: Secondary | ICD-10-CM | POA: Diagnosis not present

## 2020-01-14 DIAGNOSIS — Z87891 Personal history of nicotine dependence: Secondary | ICD-10-CM | POA: Diagnosis not present

## 2020-01-14 DIAGNOSIS — I509 Heart failure, unspecified: Secondary | ICD-10-CM | POA: Diagnosis not present

## 2020-01-14 DIAGNOSIS — S81802A Unspecified open wound, left lower leg, initial encounter: Secondary | ICD-10-CM | POA: Diagnosis not present

## 2020-01-14 DIAGNOSIS — Z7984 Long term (current) use of oral hypoglycemic drugs: Secondary | ICD-10-CM | POA: Diagnosis not present

## 2020-01-22 DIAGNOSIS — Z8616 Personal history of COVID-19: Secondary | ICD-10-CM | POA: Diagnosis not present

## 2020-01-22 DIAGNOSIS — I739 Peripheral vascular disease, unspecified: Secondary | ICD-10-CM | POA: Diagnosis not present

## 2020-01-22 DIAGNOSIS — I70242 Atherosclerosis of native arteries of left leg with ulceration of calf: Secondary | ICD-10-CM | POA: Diagnosis not present

## 2020-01-22 DIAGNOSIS — I5030 Unspecified diastolic (congestive) heart failure: Secondary | ICD-10-CM | POA: Diagnosis not present

## 2020-01-22 DIAGNOSIS — Z7902 Long term (current) use of antithrombotics/antiplatelets: Secondary | ICD-10-CM | POA: Diagnosis not present

## 2020-01-22 DIAGNOSIS — M7582 Other shoulder lesions, left shoulder: Secondary | ICD-10-CM | POA: Diagnosis not present

## 2020-01-22 DIAGNOSIS — Z955 Presence of coronary angioplasty implant and graft: Secondary | ICD-10-CM | POA: Diagnosis not present

## 2020-01-22 DIAGNOSIS — E669 Obesity, unspecified: Secondary | ICD-10-CM | POA: Diagnosis not present

## 2020-01-22 DIAGNOSIS — I2510 Atherosclerotic heart disease of native coronary artery without angina pectoris: Secondary | ICD-10-CM | POA: Diagnosis not present

## 2020-01-22 DIAGNOSIS — Z7984 Long term (current) use of oral hypoglycemic drugs: Secondary | ICD-10-CM | POA: Diagnosis not present

## 2020-01-22 DIAGNOSIS — Z48812 Encounter for surgical aftercare following surgery on the circulatory system: Secondary | ICD-10-CM | POA: Diagnosis not present

## 2020-01-22 DIAGNOSIS — I050 Rheumatic mitral stenosis: Secondary | ICD-10-CM | POA: Diagnosis not present

## 2020-01-22 DIAGNOSIS — Z8701 Personal history of pneumonia (recurrent): Secondary | ICD-10-CM | POA: Diagnosis not present

## 2020-01-22 DIAGNOSIS — Z95820 Peripheral vascular angioplasty status with implants and grafts: Secondary | ICD-10-CM | POA: Diagnosis not present

## 2020-01-22 DIAGNOSIS — R600 Localized edema: Secondary | ICD-10-CM | POA: Diagnosis not present

## 2020-01-22 DIAGNOSIS — Z6832 Body mass index (BMI) 32.0-32.9, adult: Secondary | ICD-10-CM | POA: Diagnosis not present

## 2020-01-22 DIAGNOSIS — Z96611 Presence of right artificial shoulder joint: Secondary | ICD-10-CM | POA: Diagnosis not present

## 2020-01-22 DIAGNOSIS — Z9981 Dependence on supplemental oxygen: Secondary | ICD-10-CM | POA: Diagnosis not present

## 2020-01-22 DIAGNOSIS — E1151 Type 2 diabetes mellitus with diabetic peripheral angiopathy without gangrene: Secondary | ICD-10-CM | POA: Diagnosis not present

## 2020-01-22 DIAGNOSIS — J9601 Acute respiratory failure with hypoxia: Secondary | ICD-10-CM | POA: Diagnosis not present

## 2020-01-22 DIAGNOSIS — E785 Hyperlipidemia, unspecified: Secondary | ICD-10-CM | POA: Diagnosis not present

## 2020-01-22 DIAGNOSIS — Z79891 Long term (current) use of opiate analgesic: Secondary | ICD-10-CM | POA: Diagnosis not present

## 2020-01-22 DIAGNOSIS — I110 Hypertensive heart disease with heart failure: Secondary | ICD-10-CM | POA: Diagnosis not present

## 2020-01-22 DIAGNOSIS — L97221 Non-pressure chronic ulcer of left calf limited to breakdown of skin: Secondary | ICD-10-CM | POA: Diagnosis not present

## 2020-01-22 DIAGNOSIS — I0981 Rheumatic heart failure: Secondary | ICD-10-CM | POA: Diagnosis not present

## 2020-01-22 DIAGNOSIS — M47812 Spondylosis without myelopathy or radiculopathy, cervical region: Secondary | ICD-10-CM | POA: Diagnosis not present

## 2020-01-22 DIAGNOSIS — Z7982 Long term (current) use of aspirin: Secondary | ICD-10-CM | POA: Diagnosis not present

## 2020-01-22 DIAGNOSIS — L97921 Non-pressure chronic ulcer of unspecified part of left lower leg limited to breakdown of skin: Secondary | ICD-10-CM | POA: Diagnosis not present

## 2020-01-27 DIAGNOSIS — S81802A Unspecified open wound, left lower leg, initial encounter: Secondary | ICD-10-CM | POA: Diagnosis not present

## 2020-01-27 DIAGNOSIS — T8189XA Other complications of procedures, not elsewhere classified, initial encounter: Secondary | ICD-10-CM | POA: Diagnosis not present

## 2020-01-27 DIAGNOSIS — S81829A Laceration with foreign body, unspecified lower leg, initial encounter: Secondary | ICD-10-CM | POA: Diagnosis not present

## 2020-01-29 DIAGNOSIS — Z7984 Long term (current) use of oral hypoglycemic drugs: Secondary | ICD-10-CM | POA: Diagnosis not present

## 2020-01-29 DIAGNOSIS — E1151 Type 2 diabetes mellitus with diabetic peripheral angiopathy without gangrene: Secondary | ICD-10-CM | POA: Diagnosis not present

## 2020-01-29 DIAGNOSIS — Z8701 Personal history of pneumonia (recurrent): Secondary | ICD-10-CM | POA: Diagnosis not present

## 2020-01-29 DIAGNOSIS — I2510 Atherosclerotic heart disease of native coronary artery without angina pectoris: Secondary | ICD-10-CM | POA: Diagnosis not present

## 2020-01-29 DIAGNOSIS — Z9981 Dependence on supplemental oxygen: Secondary | ICD-10-CM | POA: Diagnosis not present

## 2020-01-29 DIAGNOSIS — E785 Hyperlipidemia, unspecified: Secondary | ICD-10-CM | POA: Diagnosis not present

## 2020-01-29 DIAGNOSIS — M47812 Spondylosis without myelopathy or radiculopathy, cervical region: Secondary | ICD-10-CM | POA: Diagnosis not present

## 2020-01-29 DIAGNOSIS — E669 Obesity, unspecified: Secondary | ICD-10-CM | POA: Diagnosis not present

## 2020-01-29 DIAGNOSIS — Z95820 Peripheral vascular angioplasty status with implants and grafts: Secondary | ICD-10-CM | POA: Diagnosis not present

## 2020-01-29 DIAGNOSIS — Z96611 Presence of right artificial shoulder joint: Secondary | ICD-10-CM | POA: Diagnosis not present

## 2020-01-29 DIAGNOSIS — M7582 Other shoulder lesions, left shoulder: Secondary | ICD-10-CM | POA: Diagnosis not present

## 2020-01-29 DIAGNOSIS — Z955 Presence of coronary angioplasty implant and graft: Secondary | ICD-10-CM | POA: Diagnosis not present

## 2020-01-29 DIAGNOSIS — Z7902 Long term (current) use of antithrombotics/antiplatelets: Secondary | ICD-10-CM | POA: Diagnosis not present

## 2020-01-29 DIAGNOSIS — Z8616 Personal history of COVID-19: Secondary | ICD-10-CM | POA: Diagnosis not present

## 2020-01-29 DIAGNOSIS — J9601 Acute respiratory failure with hypoxia: Secondary | ICD-10-CM | POA: Diagnosis not present

## 2020-01-29 DIAGNOSIS — I0981 Rheumatic heart failure: Secondary | ICD-10-CM | POA: Diagnosis not present

## 2020-01-29 DIAGNOSIS — L97221 Non-pressure chronic ulcer of left calf limited to breakdown of skin: Secondary | ICD-10-CM | POA: Diagnosis not present

## 2020-01-29 DIAGNOSIS — I050 Rheumatic mitral stenosis: Secondary | ICD-10-CM | POA: Diagnosis not present

## 2020-01-29 DIAGNOSIS — I110 Hypertensive heart disease with heart failure: Secondary | ICD-10-CM | POA: Diagnosis not present

## 2020-01-29 DIAGNOSIS — Z79891 Long term (current) use of opiate analgesic: Secondary | ICD-10-CM | POA: Diagnosis not present

## 2020-01-29 DIAGNOSIS — Z6832 Body mass index (BMI) 32.0-32.9, adult: Secondary | ICD-10-CM | POA: Diagnosis not present

## 2020-01-29 DIAGNOSIS — Z48812 Encounter for surgical aftercare following surgery on the circulatory system: Secondary | ICD-10-CM | POA: Diagnosis not present

## 2020-01-29 DIAGNOSIS — I5030 Unspecified diastolic (congestive) heart failure: Secondary | ICD-10-CM | POA: Diagnosis not present

## 2020-01-29 DIAGNOSIS — Z7982 Long term (current) use of aspirin: Secondary | ICD-10-CM | POA: Diagnosis not present

## 2020-01-29 DIAGNOSIS — I70242 Atherosclerosis of native arteries of left leg with ulceration of calf: Secondary | ICD-10-CM | POA: Diagnosis not present

## 2020-02-05 DIAGNOSIS — Z79899 Other long term (current) drug therapy: Secondary | ICD-10-CM | POA: Diagnosis not present

## 2020-02-05 DIAGNOSIS — I2510 Atherosclerotic heart disease of native coronary artery without angina pectoris: Secondary | ICD-10-CM | POA: Diagnosis not present

## 2020-02-05 DIAGNOSIS — E119 Type 2 diabetes mellitus without complications: Secondary | ICD-10-CM | POA: Diagnosis not present

## 2020-02-05 DIAGNOSIS — E7800 Pure hypercholesterolemia, unspecified: Secondary | ICD-10-CM | POA: Diagnosis not present

## 2020-02-05 DIAGNOSIS — E1165 Type 2 diabetes mellitus with hyperglycemia: Secondary | ICD-10-CM | POA: Diagnosis not present

## 2020-02-05 DIAGNOSIS — I739 Peripheral vascular disease, unspecified: Secondary | ICD-10-CM | POA: Diagnosis not present

## 2020-02-05 DIAGNOSIS — I10 Essential (primary) hypertension: Secondary | ICD-10-CM | POA: Diagnosis not present

## 2020-02-09 DIAGNOSIS — I739 Peripheral vascular disease, unspecified: Secondary | ICD-10-CM | POA: Diagnosis not present

## 2020-02-09 DIAGNOSIS — I10 Essential (primary) hypertension: Secondary | ICD-10-CM | POA: Diagnosis not present

## 2020-02-09 DIAGNOSIS — I342 Nonrheumatic mitral (valve) stenosis: Secondary | ICD-10-CM | POA: Diagnosis not present

## 2020-02-15 DIAGNOSIS — I050 Rheumatic mitral stenosis: Secondary | ICD-10-CM | POA: Diagnosis not present

## 2020-02-15 DIAGNOSIS — Z6832 Body mass index (BMI) 32.0-32.9, adult: Secondary | ICD-10-CM | POA: Diagnosis not present

## 2020-02-15 DIAGNOSIS — Z8616 Personal history of COVID-19: Secondary | ICD-10-CM | POA: Diagnosis not present

## 2020-02-15 DIAGNOSIS — M47812 Spondylosis without myelopathy or radiculopathy, cervical region: Secondary | ICD-10-CM | POA: Diagnosis not present

## 2020-02-15 DIAGNOSIS — Z79891 Long term (current) use of opiate analgesic: Secondary | ICD-10-CM | POA: Diagnosis not present

## 2020-02-15 DIAGNOSIS — E669 Obesity, unspecified: Secondary | ICD-10-CM | POA: Diagnosis not present

## 2020-02-15 DIAGNOSIS — Z96611 Presence of right artificial shoulder joint: Secondary | ICD-10-CM | POA: Diagnosis not present

## 2020-02-15 DIAGNOSIS — Z7902 Long term (current) use of antithrombotics/antiplatelets: Secondary | ICD-10-CM | POA: Diagnosis not present

## 2020-02-15 DIAGNOSIS — E785 Hyperlipidemia, unspecified: Secondary | ICD-10-CM | POA: Diagnosis not present

## 2020-02-15 DIAGNOSIS — I2510 Atherosclerotic heart disease of native coronary artery without angina pectoris: Secondary | ICD-10-CM | POA: Diagnosis not present

## 2020-02-15 DIAGNOSIS — Z7982 Long term (current) use of aspirin: Secondary | ICD-10-CM | POA: Diagnosis not present

## 2020-02-15 DIAGNOSIS — M7582 Other shoulder lesions, left shoulder: Secondary | ICD-10-CM | POA: Diagnosis not present

## 2020-02-15 DIAGNOSIS — L97221 Non-pressure chronic ulcer of left calf limited to breakdown of skin: Secondary | ICD-10-CM | POA: Diagnosis not present

## 2020-02-15 DIAGNOSIS — I5030 Unspecified diastolic (congestive) heart failure: Secondary | ICD-10-CM | POA: Diagnosis not present

## 2020-02-15 DIAGNOSIS — I0981 Rheumatic heart failure: Secondary | ICD-10-CM | POA: Diagnosis not present

## 2020-02-15 DIAGNOSIS — Z7984 Long term (current) use of oral hypoglycemic drugs: Secondary | ICD-10-CM | POA: Diagnosis not present

## 2020-02-15 DIAGNOSIS — I70242 Atherosclerosis of native arteries of left leg with ulceration of calf: Secondary | ICD-10-CM | POA: Diagnosis not present

## 2020-02-15 DIAGNOSIS — S81802A Unspecified open wound, left lower leg, initial encounter: Secondary | ICD-10-CM | POA: Diagnosis not present

## 2020-02-15 DIAGNOSIS — Z9981 Dependence on supplemental oxygen: Secondary | ICD-10-CM | POA: Diagnosis not present

## 2020-02-15 DIAGNOSIS — Z955 Presence of coronary angioplasty implant and graft: Secondary | ICD-10-CM | POA: Diagnosis not present

## 2020-02-15 DIAGNOSIS — Z95820 Peripheral vascular angioplasty status with implants and grafts: Secondary | ICD-10-CM | POA: Diagnosis not present

## 2020-02-15 DIAGNOSIS — E1151 Type 2 diabetes mellitus with diabetic peripheral angiopathy without gangrene: Secondary | ICD-10-CM | POA: Diagnosis not present

## 2020-02-15 DIAGNOSIS — I110 Hypertensive heart disease with heart failure: Secondary | ICD-10-CM | POA: Diagnosis not present

## 2020-02-15 DIAGNOSIS — Z48812 Encounter for surgical aftercare following surgery on the circulatory system: Secondary | ICD-10-CM | POA: Diagnosis not present

## 2020-02-15 DIAGNOSIS — J9601 Acute respiratory failure with hypoxia: Secondary | ICD-10-CM | POA: Diagnosis not present

## 2020-02-15 DIAGNOSIS — Z8701 Personal history of pneumonia (recurrent): Secondary | ICD-10-CM | POA: Diagnosis not present

## 2020-02-15 DIAGNOSIS — T8189XA Other complications of procedures, not elsewhere classified, initial encounter: Secondary | ICD-10-CM | POA: Diagnosis not present

## 2020-02-18 DIAGNOSIS — S81829A Laceration with foreign body, unspecified lower leg, initial encounter: Secondary | ICD-10-CM | POA: Diagnosis not present

## 2020-02-19 DIAGNOSIS — I890 Lymphedema, not elsewhere classified: Secondary | ICD-10-CM | POA: Diagnosis not present

## 2020-02-19 DIAGNOSIS — L97221 Non-pressure chronic ulcer of left calf limited to breakdown of skin: Secondary | ICD-10-CM | POA: Diagnosis not present

## 2020-02-19 DIAGNOSIS — I872 Venous insufficiency (chronic) (peripheral): Secondary | ICD-10-CM | POA: Diagnosis not present

## 2020-02-19 DIAGNOSIS — L97921 Non-pressure chronic ulcer of unspecified part of left lower leg limited to breakdown of skin: Secondary | ICD-10-CM | POA: Diagnosis not present

## 2020-02-19 DIAGNOSIS — I739 Peripheral vascular disease, unspecified: Secondary | ICD-10-CM | POA: Diagnosis not present

## 2020-02-19 DIAGNOSIS — R600 Localized edema: Secondary | ICD-10-CM | POA: Diagnosis not present

## 2020-02-25 DIAGNOSIS — S81802A Unspecified open wound, left lower leg, initial encounter: Secondary | ICD-10-CM | POA: Diagnosis not present

## 2020-02-25 DIAGNOSIS — T8189XA Other complications of procedures, not elsewhere classified, initial encounter: Secondary | ICD-10-CM | POA: Diagnosis not present

## 2020-02-26 DIAGNOSIS — E1151 Type 2 diabetes mellitus with diabetic peripheral angiopathy without gangrene: Secondary | ICD-10-CM | POA: Diagnosis not present

## 2020-02-26 DIAGNOSIS — Z79891 Long term (current) use of opiate analgesic: Secondary | ICD-10-CM | POA: Diagnosis not present

## 2020-02-26 DIAGNOSIS — E785 Hyperlipidemia, unspecified: Secondary | ICD-10-CM | POA: Diagnosis not present

## 2020-02-26 DIAGNOSIS — I5030 Unspecified diastolic (congestive) heart failure: Secondary | ICD-10-CM | POA: Diagnosis not present

## 2020-02-26 DIAGNOSIS — J9601 Acute respiratory failure with hypoxia: Secondary | ICD-10-CM | POA: Diagnosis not present

## 2020-02-26 DIAGNOSIS — M7582 Other shoulder lesions, left shoulder: Secondary | ICD-10-CM | POA: Diagnosis not present

## 2020-02-26 DIAGNOSIS — Z8616 Personal history of COVID-19: Secondary | ICD-10-CM | POA: Diagnosis not present

## 2020-02-26 DIAGNOSIS — I2510 Atherosclerotic heart disease of native coronary artery without angina pectoris: Secondary | ICD-10-CM | POA: Diagnosis not present

## 2020-02-26 DIAGNOSIS — Z6832 Body mass index (BMI) 32.0-32.9, adult: Secondary | ICD-10-CM | POA: Diagnosis not present

## 2020-02-26 DIAGNOSIS — M47812 Spondylosis without myelopathy or radiculopathy, cervical region: Secondary | ICD-10-CM | POA: Diagnosis not present

## 2020-02-26 DIAGNOSIS — Z48812 Encounter for surgical aftercare following surgery on the circulatory system: Secondary | ICD-10-CM | POA: Diagnosis not present

## 2020-02-26 DIAGNOSIS — Z8701 Personal history of pneumonia (recurrent): Secondary | ICD-10-CM | POA: Diagnosis not present

## 2020-02-26 DIAGNOSIS — Z95820 Peripheral vascular angioplasty status with implants and grafts: Secondary | ICD-10-CM | POA: Diagnosis not present

## 2020-02-26 DIAGNOSIS — Z9981 Dependence on supplemental oxygen: Secondary | ICD-10-CM | POA: Diagnosis not present

## 2020-02-26 DIAGNOSIS — Z7982 Long term (current) use of aspirin: Secondary | ICD-10-CM | POA: Diagnosis not present

## 2020-02-26 DIAGNOSIS — L97221 Non-pressure chronic ulcer of left calf limited to breakdown of skin: Secondary | ICD-10-CM | POA: Diagnosis not present

## 2020-02-26 DIAGNOSIS — I70242 Atherosclerosis of native arteries of left leg with ulceration of calf: Secondary | ICD-10-CM | POA: Diagnosis not present

## 2020-02-26 DIAGNOSIS — Z7984 Long term (current) use of oral hypoglycemic drugs: Secondary | ICD-10-CM | POA: Diagnosis not present

## 2020-02-26 DIAGNOSIS — I050 Rheumatic mitral stenosis: Secondary | ICD-10-CM | POA: Diagnosis not present

## 2020-02-26 DIAGNOSIS — I0981 Rheumatic heart failure: Secondary | ICD-10-CM | POA: Diagnosis not present

## 2020-02-26 DIAGNOSIS — Z955 Presence of coronary angioplasty implant and graft: Secondary | ICD-10-CM | POA: Diagnosis not present

## 2020-02-26 DIAGNOSIS — E669 Obesity, unspecified: Secondary | ICD-10-CM | POA: Diagnosis not present

## 2020-02-26 DIAGNOSIS — Z7902 Long term (current) use of antithrombotics/antiplatelets: Secondary | ICD-10-CM | POA: Diagnosis not present

## 2020-02-26 DIAGNOSIS — I110 Hypertensive heart disease with heart failure: Secondary | ICD-10-CM | POA: Diagnosis not present

## 2020-02-26 DIAGNOSIS — Z96611 Presence of right artificial shoulder joint: Secondary | ICD-10-CM | POA: Diagnosis not present

## 2020-02-29 DIAGNOSIS — Z7982 Long term (current) use of aspirin: Secondary | ICD-10-CM | POA: Diagnosis not present

## 2020-02-29 DIAGNOSIS — Z8701 Personal history of pneumonia (recurrent): Secondary | ICD-10-CM | POA: Diagnosis not present

## 2020-02-29 DIAGNOSIS — K5730 Diverticulosis of large intestine without perforation or abscess without bleeding: Secondary | ICD-10-CM | POA: Diagnosis not present

## 2020-02-29 DIAGNOSIS — Z955 Presence of coronary angioplasty implant and graft: Secondary | ICD-10-CM | POA: Diagnosis not present

## 2020-02-29 DIAGNOSIS — E785 Hyperlipidemia, unspecified: Secondary | ICD-10-CM | POA: Diagnosis not present

## 2020-02-29 DIAGNOSIS — Z7902 Long term (current) use of antithrombotics/antiplatelets: Secondary | ICD-10-CM | POA: Diagnosis not present

## 2020-02-29 DIAGNOSIS — J9601 Acute respiratory failure with hypoxia: Secondary | ICD-10-CM | POA: Diagnosis not present

## 2020-02-29 DIAGNOSIS — Z7984 Long term (current) use of oral hypoglycemic drugs: Secondary | ICD-10-CM | POA: Diagnosis not present

## 2020-02-29 DIAGNOSIS — I5030 Unspecified diastolic (congestive) heart failure: Secondary | ICD-10-CM | POA: Diagnosis not present

## 2020-02-29 DIAGNOSIS — M47812 Spondylosis without myelopathy or radiculopathy, cervical region: Secondary | ICD-10-CM | POA: Diagnosis not present

## 2020-02-29 DIAGNOSIS — Z86010 Personal history of colonic polyps: Secondary | ICD-10-CM | POA: Diagnosis not present

## 2020-02-29 DIAGNOSIS — Z95820 Peripheral vascular angioplasty status with implants and grafts: Secondary | ICD-10-CM | POA: Diagnosis not present

## 2020-02-29 DIAGNOSIS — M7582 Other shoulder lesions, left shoulder: Secondary | ICD-10-CM | POA: Diagnosis not present

## 2020-02-29 DIAGNOSIS — I0981 Rheumatic heart failure: Secondary | ICD-10-CM | POA: Diagnosis not present

## 2020-02-29 DIAGNOSIS — Z79899 Other long term (current) drug therapy: Secondary | ICD-10-CM | POA: Diagnosis not present

## 2020-02-29 DIAGNOSIS — Z96611 Presence of right artificial shoulder joint: Secondary | ICD-10-CM | POA: Diagnosis not present

## 2020-02-29 DIAGNOSIS — I050 Rheumatic mitral stenosis: Secondary | ICD-10-CM | POA: Diagnosis not present

## 2020-02-29 DIAGNOSIS — Z6841 Body Mass Index (BMI) 40.0 and over, adult: Secondary | ICD-10-CM | POA: Diagnosis not present

## 2020-02-29 DIAGNOSIS — E1151 Type 2 diabetes mellitus with diabetic peripheral angiopathy without gangrene: Secondary | ICD-10-CM | POA: Diagnosis not present

## 2020-02-29 DIAGNOSIS — I110 Hypertensive heart disease with heart failure: Secondary | ICD-10-CM | POA: Diagnosis not present

## 2020-02-29 DIAGNOSIS — E669 Obesity, unspecified: Secondary | ICD-10-CM | POA: Diagnosis not present

## 2020-02-29 DIAGNOSIS — T8131XD Disruption of external operation (surgical) wound, not elsewhere classified, subsequent encounter: Secondary | ICD-10-CM | POA: Diagnosis not present

## 2020-02-29 DIAGNOSIS — L97221 Non-pressure chronic ulcer of left calf limited to breakdown of skin: Secondary | ICD-10-CM | POA: Diagnosis not present

## 2020-02-29 DIAGNOSIS — I70242 Atherosclerosis of native arteries of left leg with ulceration of calf: Secondary | ICD-10-CM | POA: Diagnosis not present

## 2020-02-29 DIAGNOSIS — I2510 Atherosclerotic heart disease of native coronary artery without angina pectoris: Secondary | ICD-10-CM | POA: Diagnosis not present

## 2020-03-09 DIAGNOSIS — S81829A Laceration with foreign body, unspecified lower leg, initial encounter: Secondary | ICD-10-CM | POA: Diagnosis not present

## 2020-03-17 DIAGNOSIS — Z8701 Personal history of pneumonia (recurrent): Secondary | ICD-10-CM | POA: Diagnosis not present

## 2020-03-17 DIAGNOSIS — Z95820 Peripheral vascular angioplasty status with implants and grafts: Secondary | ICD-10-CM | POA: Diagnosis not present

## 2020-03-17 DIAGNOSIS — M7582 Other shoulder lesions, left shoulder: Secondary | ICD-10-CM | POA: Diagnosis not present

## 2020-03-17 DIAGNOSIS — E669 Obesity, unspecified: Secondary | ICD-10-CM | POA: Diagnosis not present

## 2020-03-17 DIAGNOSIS — M47812 Spondylosis without myelopathy or radiculopathy, cervical region: Secondary | ICD-10-CM | POA: Diagnosis not present

## 2020-03-17 DIAGNOSIS — Z955 Presence of coronary angioplasty implant and graft: Secondary | ICD-10-CM | POA: Diagnosis not present

## 2020-03-17 DIAGNOSIS — J9601 Acute respiratory failure with hypoxia: Secondary | ICD-10-CM | POA: Diagnosis not present

## 2020-03-17 DIAGNOSIS — T8131XD Disruption of external operation (surgical) wound, not elsewhere classified, subsequent encounter: Secondary | ICD-10-CM | POA: Diagnosis not present

## 2020-03-17 DIAGNOSIS — K5730 Diverticulosis of large intestine without perforation or abscess without bleeding: Secondary | ICD-10-CM | POA: Diagnosis not present

## 2020-03-17 DIAGNOSIS — E1151 Type 2 diabetes mellitus with diabetic peripheral angiopathy without gangrene: Secondary | ICD-10-CM | POA: Diagnosis not present

## 2020-03-17 DIAGNOSIS — Z86010 Personal history of colonic polyps: Secondary | ICD-10-CM | POA: Diagnosis not present

## 2020-03-17 DIAGNOSIS — E785 Hyperlipidemia, unspecified: Secondary | ICD-10-CM | POA: Diagnosis not present

## 2020-03-17 DIAGNOSIS — I0981 Rheumatic heart failure: Secondary | ICD-10-CM | POA: Diagnosis not present

## 2020-03-17 DIAGNOSIS — Z7902 Long term (current) use of antithrombotics/antiplatelets: Secondary | ICD-10-CM | POA: Diagnosis not present

## 2020-03-17 DIAGNOSIS — Z7984 Long term (current) use of oral hypoglycemic drugs: Secondary | ICD-10-CM | POA: Diagnosis not present

## 2020-03-17 DIAGNOSIS — Z96611 Presence of right artificial shoulder joint: Secondary | ICD-10-CM | POA: Diagnosis not present

## 2020-03-17 DIAGNOSIS — Z79899 Other long term (current) drug therapy: Secondary | ICD-10-CM | POA: Diagnosis not present

## 2020-03-17 DIAGNOSIS — I050 Rheumatic mitral stenosis: Secondary | ICD-10-CM | POA: Diagnosis not present

## 2020-03-17 DIAGNOSIS — L97221 Non-pressure chronic ulcer of left calf limited to breakdown of skin: Secondary | ICD-10-CM | POA: Diagnosis not present

## 2020-03-17 DIAGNOSIS — I5030 Unspecified diastolic (congestive) heart failure: Secondary | ICD-10-CM | POA: Diagnosis not present

## 2020-03-17 DIAGNOSIS — I70242 Atherosclerosis of native arteries of left leg with ulceration of calf: Secondary | ICD-10-CM | POA: Diagnosis not present

## 2020-03-17 DIAGNOSIS — Z6841 Body Mass Index (BMI) 40.0 and over, adult: Secondary | ICD-10-CM | POA: Diagnosis not present

## 2020-03-17 DIAGNOSIS — I110 Hypertensive heart disease with heart failure: Secondary | ICD-10-CM | POA: Diagnosis not present

## 2020-03-17 DIAGNOSIS — Z7982 Long term (current) use of aspirin: Secondary | ICD-10-CM | POA: Diagnosis not present

## 2020-03-17 DIAGNOSIS — I2510 Atherosclerotic heart disease of native coronary artery without angina pectoris: Secondary | ICD-10-CM | POA: Diagnosis not present

## 2020-03-20 DIAGNOSIS — S81829A Laceration with foreign body, unspecified lower leg, initial encounter: Secondary | ICD-10-CM | POA: Diagnosis not present

## 2020-03-23 DIAGNOSIS — Z79899 Other long term (current) drug therapy: Secondary | ICD-10-CM | POA: Diagnosis not present

## 2020-03-23 DIAGNOSIS — M7582 Other shoulder lesions, left shoulder: Secondary | ICD-10-CM | POA: Diagnosis not present

## 2020-03-23 DIAGNOSIS — E119 Type 2 diabetes mellitus without complications: Secondary | ICD-10-CM | POA: Diagnosis not present

## 2020-03-23 DIAGNOSIS — E7800 Pure hypercholesterolemia, unspecified: Secondary | ICD-10-CM | POA: Diagnosis not present

## 2020-03-23 DIAGNOSIS — Z96611 Presence of right artificial shoulder joint: Secondary | ICD-10-CM | POA: Diagnosis not present

## 2020-03-23 DIAGNOSIS — I10 Essential (primary) hypertension: Secondary | ICD-10-CM | POA: Diagnosis not present

## 2020-03-23 DIAGNOSIS — M7581 Other shoulder lesions, right shoulder: Secondary | ICD-10-CM | POA: Diagnosis not present

## 2020-03-25 DIAGNOSIS — I739 Peripheral vascular disease, unspecified: Secondary | ICD-10-CM | POA: Diagnosis not present

## 2020-03-25 DIAGNOSIS — L97221 Non-pressure chronic ulcer of left calf limited to breakdown of skin: Secondary | ICD-10-CM | POA: Diagnosis not present

## 2020-03-25 DIAGNOSIS — S81829A Laceration with foreign body, unspecified lower leg, initial encounter: Secondary | ICD-10-CM | POA: Diagnosis not present

## 2020-03-25 DIAGNOSIS — R600 Localized edema: Secondary | ICD-10-CM | POA: Diagnosis not present

## 2020-03-25 DIAGNOSIS — I872 Venous insufficiency (chronic) (peripheral): Secondary | ICD-10-CM | POA: Diagnosis not present

## 2020-03-25 DIAGNOSIS — I890 Lymphedema, not elsewhere classified: Secondary | ICD-10-CM | POA: Diagnosis not present

## 2020-03-25 DIAGNOSIS — L97921 Non-pressure chronic ulcer of unspecified part of left lower leg limited to breakdown of skin: Secondary | ICD-10-CM | POA: Diagnosis not present

## 2020-03-28 DIAGNOSIS — L570 Actinic keratosis: Secondary | ICD-10-CM | POA: Diagnosis not present

## 2020-03-28 DIAGNOSIS — L821 Other seborrheic keratosis: Secondary | ICD-10-CM | POA: Diagnosis not present

## 2020-03-29 DIAGNOSIS — T8189XA Other complications of procedures, not elsewhere classified, initial encounter: Secondary | ICD-10-CM | POA: Diagnosis not present

## 2020-03-29 DIAGNOSIS — S81802A Unspecified open wound, left lower leg, initial encounter: Secondary | ICD-10-CM | POA: Diagnosis not present

## 2020-03-30 DIAGNOSIS — I2510 Atherosclerotic heart disease of native coronary artery without angina pectoris: Secondary | ICD-10-CM | POA: Diagnosis not present

## 2020-03-30 DIAGNOSIS — G8929 Other chronic pain: Secondary | ICD-10-CM | POA: Diagnosis not present

## 2020-03-30 DIAGNOSIS — I10 Essential (primary) hypertension: Secondary | ICD-10-CM | POA: Diagnosis not present

## 2020-03-30 DIAGNOSIS — Z0000 Encounter for general adult medical examination without abnormal findings: Secondary | ICD-10-CM | POA: Diagnosis not present

## 2020-03-30 DIAGNOSIS — E7800 Pure hypercholesterolemia, unspecified: Secondary | ICD-10-CM | POA: Diagnosis not present

## 2020-03-30 DIAGNOSIS — I739 Peripheral vascular disease, unspecified: Secondary | ICD-10-CM | POA: Diagnosis not present

## 2020-03-30 DIAGNOSIS — M79605 Pain in left leg: Secondary | ICD-10-CM | POA: Diagnosis not present

## 2020-03-30 DIAGNOSIS — E119 Type 2 diabetes mellitus without complications: Secondary | ICD-10-CM | POA: Diagnosis not present

## 2020-03-31 DIAGNOSIS — I739 Peripheral vascular disease, unspecified: Secondary | ICD-10-CM | POA: Diagnosis not present

## 2020-03-31 DIAGNOSIS — T8131XD Disruption of external operation (surgical) wound, not elsewhere classified, subsequent encounter: Secondary | ICD-10-CM | POA: Diagnosis not present

## 2020-03-31 DIAGNOSIS — R600 Localized edema: Secondary | ICD-10-CM | POA: Diagnosis not present

## 2020-03-31 DIAGNOSIS — E119 Type 2 diabetes mellitus without complications: Secondary | ICD-10-CM | POA: Diagnosis not present

## 2020-04-01 DIAGNOSIS — L97221 Non-pressure chronic ulcer of left calf limited to breakdown of skin: Secondary | ICD-10-CM | POA: Diagnosis not present

## 2020-04-01 DIAGNOSIS — Z6841 Body Mass Index (BMI) 40.0 and over, adult: Secondary | ICD-10-CM | POA: Diagnosis not present

## 2020-04-01 DIAGNOSIS — Z7982 Long term (current) use of aspirin: Secondary | ICD-10-CM | POA: Diagnosis not present

## 2020-04-01 DIAGNOSIS — I5030 Unspecified diastolic (congestive) heart failure: Secondary | ICD-10-CM | POA: Diagnosis not present

## 2020-04-01 DIAGNOSIS — I050 Rheumatic mitral stenosis: Secondary | ICD-10-CM | POA: Diagnosis not present

## 2020-04-01 DIAGNOSIS — Z96611 Presence of right artificial shoulder joint: Secondary | ICD-10-CM | POA: Diagnosis not present

## 2020-04-01 DIAGNOSIS — I70242 Atherosclerosis of native arteries of left leg with ulceration of calf: Secondary | ICD-10-CM | POA: Diagnosis not present

## 2020-04-01 DIAGNOSIS — L89522 Pressure ulcer of left ankle, stage 2: Secondary | ICD-10-CM | POA: Diagnosis not present

## 2020-04-01 DIAGNOSIS — Z8701 Personal history of pneumonia (recurrent): Secondary | ICD-10-CM | POA: Diagnosis not present

## 2020-04-01 DIAGNOSIS — Z95820 Peripheral vascular angioplasty status with implants and grafts: Secondary | ICD-10-CM | POA: Diagnosis not present

## 2020-04-01 DIAGNOSIS — L97821 Non-pressure chronic ulcer of other part of left lower leg limited to breakdown of skin: Secondary | ICD-10-CM | POA: Diagnosis not present

## 2020-04-01 DIAGNOSIS — J9601 Acute respiratory failure with hypoxia: Secondary | ICD-10-CM | POA: Diagnosis not present

## 2020-04-01 DIAGNOSIS — K5730 Diverticulosis of large intestine without perforation or abscess without bleeding: Secondary | ICD-10-CM | POA: Diagnosis not present

## 2020-04-01 DIAGNOSIS — L97921 Non-pressure chronic ulcer of unspecified part of left lower leg limited to breakdown of skin: Secondary | ICD-10-CM | POA: Diagnosis not present

## 2020-04-01 DIAGNOSIS — E1151 Type 2 diabetes mellitus with diabetic peripheral angiopathy without gangrene: Secondary | ICD-10-CM | POA: Diagnosis not present

## 2020-04-01 DIAGNOSIS — Z79899 Other long term (current) drug therapy: Secondary | ICD-10-CM | POA: Diagnosis not present

## 2020-04-01 DIAGNOSIS — E11621 Type 2 diabetes mellitus with foot ulcer: Secondary | ICD-10-CM | POA: Diagnosis not present

## 2020-04-01 DIAGNOSIS — M7582 Other shoulder lesions, left shoulder: Secondary | ICD-10-CM | POA: Diagnosis not present

## 2020-04-01 DIAGNOSIS — Z86010 Personal history of colonic polyps: Secondary | ICD-10-CM | POA: Diagnosis not present

## 2020-04-01 DIAGNOSIS — M47812 Spondylosis without myelopathy or radiculopathy, cervical region: Secondary | ICD-10-CM | POA: Diagnosis not present

## 2020-04-01 DIAGNOSIS — T8131XD Disruption of external operation (surgical) wound, not elsewhere classified, subsequent encounter: Secondary | ICD-10-CM | POA: Diagnosis not present

## 2020-04-01 DIAGNOSIS — E119 Type 2 diabetes mellitus without complications: Secondary | ICD-10-CM | POA: Diagnosis not present

## 2020-04-01 DIAGNOSIS — Z7902 Long term (current) use of antithrombotics/antiplatelets: Secondary | ICD-10-CM | POA: Diagnosis not present

## 2020-04-01 DIAGNOSIS — I509 Heart failure, unspecified: Secondary | ICD-10-CM | POA: Diagnosis not present

## 2020-04-01 DIAGNOSIS — E785 Hyperlipidemia, unspecified: Secondary | ICD-10-CM | POA: Diagnosis not present

## 2020-04-01 DIAGNOSIS — Z955 Presence of coronary angioplasty implant and graft: Secondary | ICD-10-CM | POA: Diagnosis not present

## 2020-04-01 DIAGNOSIS — Z7984 Long term (current) use of oral hypoglycemic drugs: Secondary | ICD-10-CM | POA: Diagnosis not present

## 2020-04-01 DIAGNOSIS — I2510 Atherosclerotic heart disease of native coronary artery without angina pectoris: Secondary | ICD-10-CM | POA: Diagnosis not present

## 2020-04-01 DIAGNOSIS — Z87891 Personal history of nicotine dependence: Secondary | ICD-10-CM | POA: Diagnosis not present

## 2020-04-01 DIAGNOSIS — I0981 Rheumatic heart failure: Secondary | ICD-10-CM | POA: Diagnosis not present

## 2020-04-01 DIAGNOSIS — E669 Obesity, unspecified: Secondary | ICD-10-CM | POA: Diagnosis not present

## 2020-04-01 DIAGNOSIS — L89623 Pressure ulcer of left heel, stage 3: Secondary | ICD-10-CM | POA: Diagnosis not present

## 2020-04-01 DIAGNOSIS — I110 Hypertensive heart disease with heart failure: Secondary | ICD-10-CM | POA: Diagnosis not present

## 2020-04-01 DIAGNOSIS — E11622 Type 2 diabetes mellitus with other skin ulcer: Secondary | ICD-10-CM | POA: Diagnosis not present

## 2020-04-12 DIAGNOSIS — S81829A Laceration with foreign body, unspecified lower leg, initial encounter: Secondary | ICD-10-CM | POA: Diagnosis not present

## 2020-04-12 DIAGNOSIS — L97921 Non-pressure chronic ulcer of unspecified part of left lower leg limited to breakdown of skin: Secondary | ICD-10-CM | POA: Diagnosis not present

## 2020-04-12 DIAGNOSIS — L97221 Non-pressure chronic ulcer of left calf limited to breakdown of skin: Secondary | ICD-10-CM | POA: Diagnosis not present

## 2020-04-12 DIAGNOSIS — I739 Peripheral vascular disease, unspecified: Secondary | ICD-10-CM | POA: Diagnosis not present

## 2020-04-12 DIAGNOSIS — T8189XD Other complications of procedures, not elsewhere classified, subsequent encounter: Secondary | ICD-10-CM | POA: Diagnosis not present

## 2020-04-12 DIAGNOSIS — T8189XA Other complications of procedures, not elsewhere classified, initial encounter: Secondary | ICD-10-CM | POA: Diagnosis not present

## 2020-04-12 DIAGNOSIS — R600 Localized edema: Secondary | ICD-10-CM | POA: Diagnosis not present

## 2020-04-13 DIAGNOSIS — T8189XA Other complications of procedures, not elsewhere classified, initial encounter: Secondary | ICD-10-CM | POA: Diagnosis not present

## 2020-04-13 DIAGNOSIS — S91302A Unspecified open wound, left foot, initial encounter: Secondary | ICD-10-CM | POA: Diagnosis not present

## 2020-04-13 DIAGNOSIS — S81802A Unspecified open wound, left lower leg, initial encounter: Secondary | ICD-10-CM | POA: Diagnosis not present

## 2020-04-15 DIAGNOSIS — Z6841 Body Mass Index (BMI) 40.0 and over, adult: Secondary | ICD-10-CM | POA: Diagnosis not present

## 2020-04-15 DIAGNOSIS — L97221 Non-pressure chronic ulcer of left calf limited to breakdown of skin: Secondary | ICD-10-CM | POA: Diagnosis not present

## 2020-04-15 DIAGNOSIS — J9601 Acute respiratory failure with hypoxia: Secondary | ICD-10-CM | POA: Diagnosis not present

## 2020-04-15 DIAGNOSIS — T8131XD Disruption of external operation (surgical) wound, not elsewhere classified, subsequent encounter: Secondary | ICD-10-CM | POA: Diagnosis not present

## 2020-04-15 DIAGNOSIS — I70242 Atherosclerosis of native arteries of left leg with ulceration of calf: Secondary | ICD-10-CM | POA: Diagnosis not present

## 2020-04-15 DIAGNOSIS — I110 Hypertensive heart disease with heart failure: Secondary | ICD-10-CM | POA: Diagnosis not present

## 2020-04-15 DIAGNOSIS — I2510 Atherosclerotic heart disease of native coronary artery without angina pectoris: Secondary | ICD-10-CM | POA: Diagnosis not present

## 2020-04-15 DIAGNOSIS — E1151 Type 2 diabetes mellitus with diabetic peripheral angiopathy without gangrene: Secondary | ICD-10-CM | POA: Diagnosis not present

## 2020-04-15 DIAGNOSIS — Z7984 Long term (current) use of oral hypoglycemic drugs: Secondary | ICD-10-CM | POA: Diagnosis not present

## 2020-04-15 DIAGNOSIS — K5730 Diverticulosis of large intestine without perforation or abscess without bleeding: Secondary | ICD-10-CM | POA: Diagnosis not present

## 2020-04-15 DIAGNOSIS — Z7902 Long term (current) use of antithrombotics/antiplatelets: Secondary | ICD-10-CM | POA: Diagnosis not present

## 2020-04-15 DIAGNOSIS — Z7982 Long term (current) use of aspirin: Secondary | ICD-10-CM | POA: Diagnosis not present

## 2020-04-15 DIAGNOSIS — Z95820 Peripheral vascular angioplasty status with implants and grafts: Secondary | ICD-10-CM | POA: Diagnosis not present

## 2020-04-15 DIAGNOSIS — E669 Obesity, unspecified: Secondary | ICD-10-CM | POA: Diagnosis not present

## 2020-04-15 DIAGNOSIS — I050 Rheumatic mitral stenosis: Secondary | ICD-10-CM | POA: Diagnosis not present

## 2020-04-15 DIAGNOSIS — M47812 Spondylosis without myelopathy or radiculopathy, cervical region: Secondary | ICD-10-CM | POA: Diagnosis not present

## 2020-04-15 DIAGNOSIS — I5030 Unspecified diastolic (congestive) heart failure: Secondary | ICD-10-CM | POA: Diagnosis not present

## 2020-04-15 DIAGNOSIS — Z955 Presence of coronary angioplasty implant and graft: Secondary | ICD-10-CM | POA: Diagnosis not present

## 2020-04-15 DIAGNOSIS — Z86010 Personal history of colonic polyps: Secondary | ICD-10-CM | POA: Diagnosis not present

## 2020-04-15 DIAGNOSIS — E785 Hyperlipidemia, unspecified: Secondary | ICD-10-CM | POA: Diagnosis not present

## 2020-04-15 DIAGNOSIS — M7582 Other shoulder lesions, left shoulder: Secondary | ICD-10-CM | POA: Diagnosis not present

## 2020-04-15 DIAGNOSIS — I0981 Rheumatic heart failure: Secondary | ICD-10-CM | POA: Diagnosis not present

## 2020-04-15 DIAGNOSIS — Z96611 Presence of right artificial shoulder joint: Secondary | ICD-10-CM | POA: Diagnosis not present

## 2020-04-15 DIAGNOSIS — Z79899 Other long term (current) drug therapy: Secondary | ICD-10-CM | POA: Diagnosis not present

## 2020-04-15 DIAGNOSIS — Z8701 Personal history of pneumonia (recurrent): Secondary | ICD-10-CM | POA: Diagnosis not present

## 2020-04-27 DIAGNOSIS — T8189XA Other complications of procedures, not elsewhere classified, initial encounter: Secondary | ICD-10-CM | POA: Diagnosis not present

## 2020-04-27 DIAGNOSIS — S81802A Unspecified open wound, left lower leg, initial encounter: Secondary | ICD-10-CM | POA: Diagnosis not present

## 2020-04-27 DIAGNOSIS — S91302A Unspecified open wound, left foot, initial encounter: Secondary | ICD-10-CM | POA: Diagnosis not present

## 2020-04-29 DIAGNOSIS — M47812 Spondylosis without myelopathy or radiculopathy, cervical region: Secondary | ICD-10-CM | POA: Diagnosis not present

## 2020-04-29 DIAGNOSIS — I0981 Rheumatic heart failure: Secondary | ICD-10-CM | POA: Diagnosis not present

## 2020-04-29 DIAGNOSIS — K5730 Diverticulosis of large intestine without perforation or abscess without bleeding: Secondary | ICD-10-CM | POA: Diagnosis not present

## 2020-04-29 DIAGNOSIS — Z6841 Body Mass Index (BMI) 40.0 and over, adult: Secondary | ICD-10-CM | POA: Diagnosis not present

## 2020-04-29 DIAGNOSIS — Z7982 Long term (current) use of aspirin: Secondary | ICD-10-CM | POA: Diagnosis not present

## 2020-04-29 DIAGNOSIS — T8131XD Disruption of external operation (surgical) wound, not elsewhere classified, subsequent encounter: Secondary | ICD-10-CM | POA: Diagnosis not present

## 2020-04-29 DIAGNOSIS — L97421 Non-pressure chronic ulcer of left heel and midfoot limited to breakdown of skin: Secondary | ICD-10-CM | POA: Diagnosis not present

## 2020-04-29 DIAGNOSIS — I70242 Atherosclerosis of native arteries of left leg with ulceration of calf: Secondary | ICD-10-CM | POA: Diagnosis not present

## 2020-04-29 DIAGNOSIS — Z79899 Other long term (current) drug therapy: Secondary | ICD-10-CM | POA: Diagnosis not present

## 2020-04-29 DIAGNOSIS — L97221 Non-pressure chronic ulcer of left calf limited to breakdown of skin: Secondary | ICD-10-CM | POA: Diagnosis not present

## 2020-04-29 DIAGNOSIS — Z7984 Long term (current) use of oral hypoglycemic drugs: Secondary | ICD-10-CM | POA: Diagnosis not present

## 2020-04-29 DIAGNOSIS — Z7902 Long term (current) use of antithrombotics/antiplatelets: Secondary | ICD-10-CM | POA: Diagnosis not present

## 2020-04-29 DIAGNOSIS — I110 Hypertensive heart disease with heart failure: Secondary | ICD-10-CM | POA: Diagnosis not present

## 2020-04-29 DIAGNOSIS — E1151 Type 2 diabetes mellitus with diabetic peripheral angiopathy without gangrene: Secondary | ICD-10-CM | POA: Diagnosis not present

## 2020-04-29 DIAGNOSIS — Z96611 Presence of right artificial shoulder joint: Secondary | ICD-10-CM | POA: Diagnosis not present

## 2020-04-29 DIAGNOSIS — J9601 Acute respiratory failure with hypoxia: Secondary | ICD-10-CM | POA: Diagnosis not present

## 2020-04-29 DIAGNOSIS — I050 Rheumatic mitral stenosis: Secondary | ICD-10-CM | POA: Diagnosis not present

## 2020-04-29 DIAGNOSIS — E785 Hyperlipidemia, unspecified: Secondary | ICD-10-CM | POA: Diagnosis not present

## 2020-04-29 DIAGNOSIS — E669 Obesity, unspecified: Secondary | ICD-10-CM | POA: Diagnosis not present

## 2020-04-29 DIAGNOSIS — Z95820 Peripheral vascular angioplasty status with implants and grafts: Secondary | ICD-10-CM | POA: Diagnosis not present

## 2020-04-29 DIAGNOSIS — I70244 Atherosclerosis of native arteries of left leg with ulceration of heel and midfoot: Secondary | ICD-10-CM | POA: Diagnosis not present

## 2020-04-29 DIAGNOSIS — M7582 Other shoulder lesions, left shoulder: Secondary | ICD-10-CM | POA: Diagnosis not present

## 2020-04-29 DIAGNOSIS — I2510 Atherosclerotic heart disease of native coronary artery without angina pectoris: Secondary | ICD-10-CM | POA: Diagnosis not present

## 2020-04-29 DIAGNOSIS — I5030 Unspecified diastolic (congestive) heart failure: Secondary | ICD-10-CM | POA: Diagnosis not present

## 2020-04-29 DIAGNOSIS — Z955 Presence of coronary angioplasty implant and graft: Secondary | ICD-10-CM | POA: Diagnosis not present

## 2020-05-02 IMAGING — MG DIGITAL SCREENING BILAT W/ TOMO W/ CAD
6 of 12 series · 6 of 36 positions shown · non-contrast
Comparison: Previous exam(s).

CLINICAL DATA: Screening.

EXAM:
DIGITAL SCREENING BILATERAL MAMMOGRAM WITH TOMO AND CAD

[L CC synth-2D]
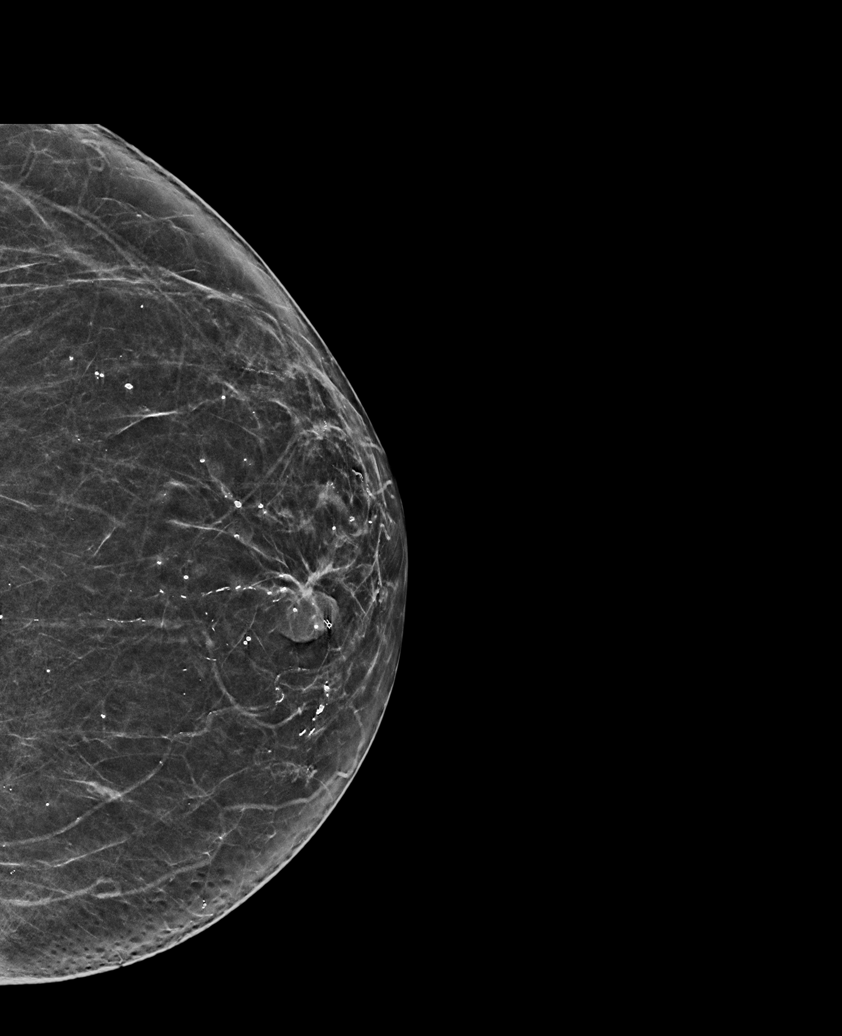

[R MLO synth-2D]
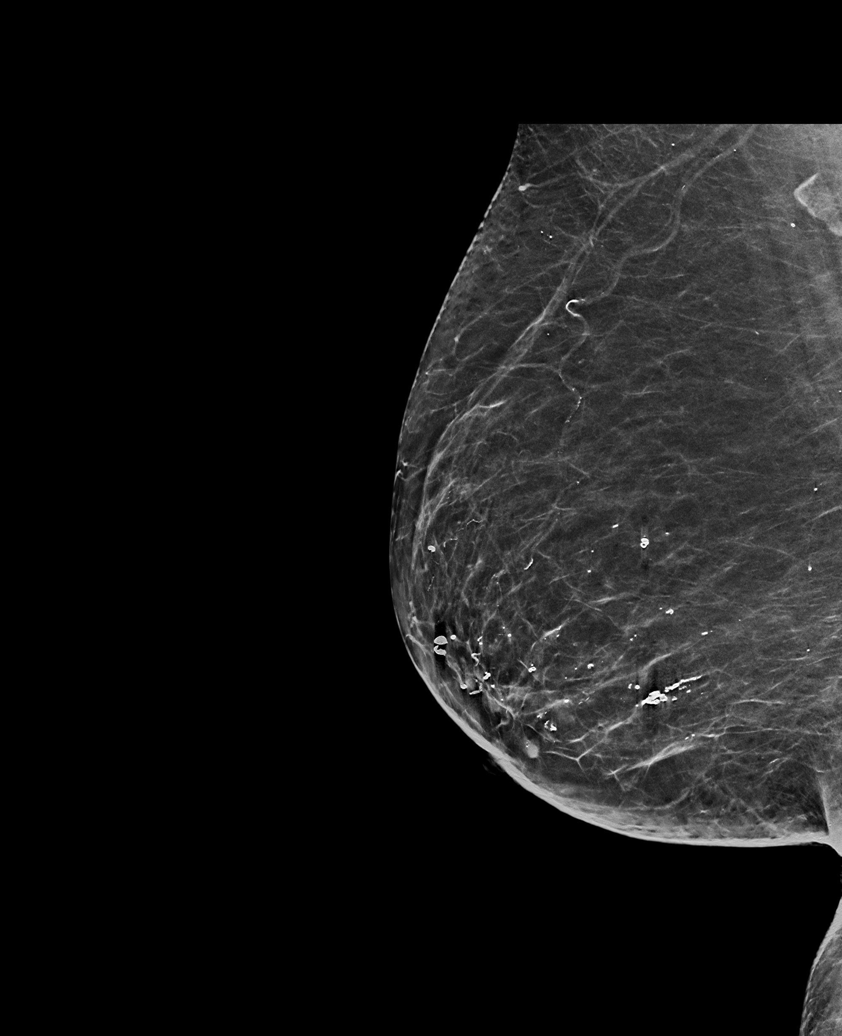

[L MLO synth-2D (1 of 2)]
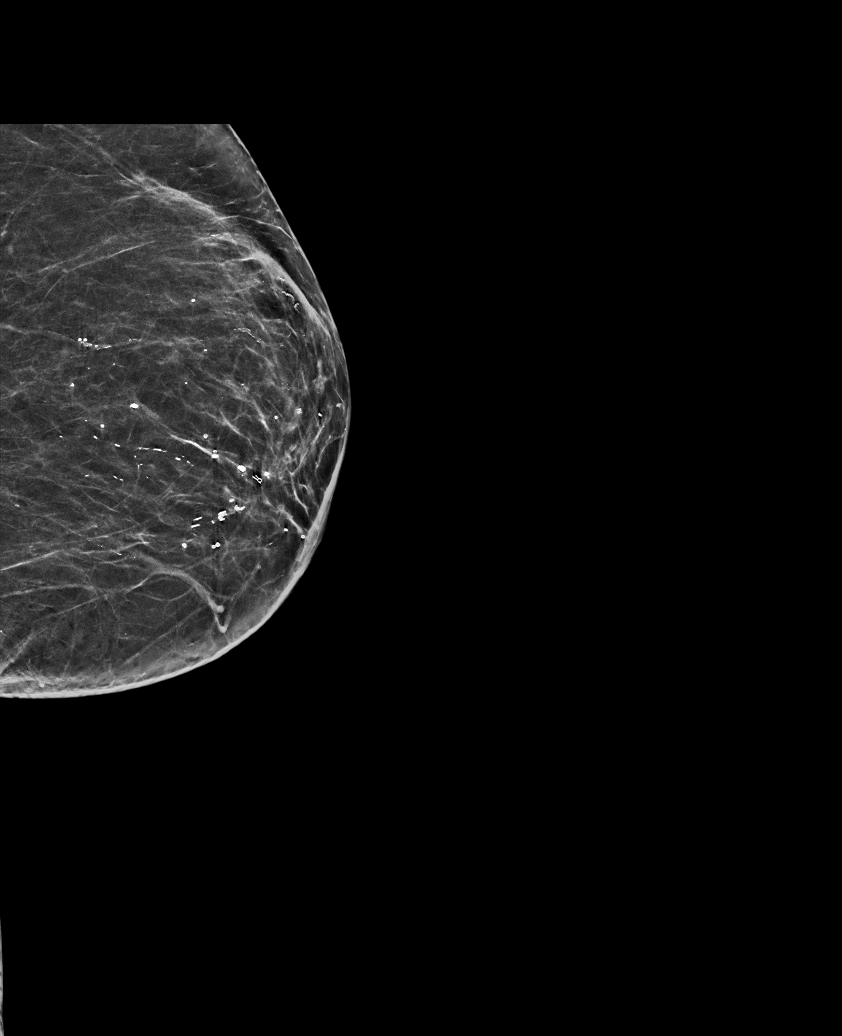

[R CC synth-2D (1 of 2)]
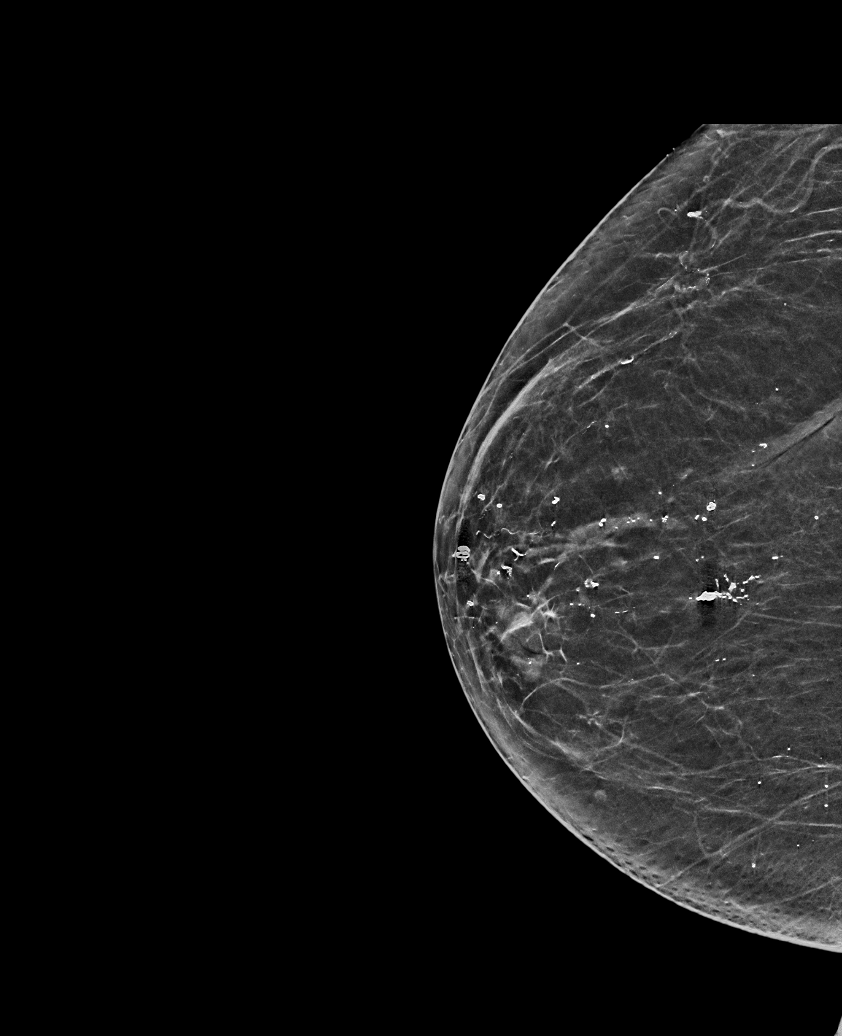

[L MLO synth-2D (2 of 2)]
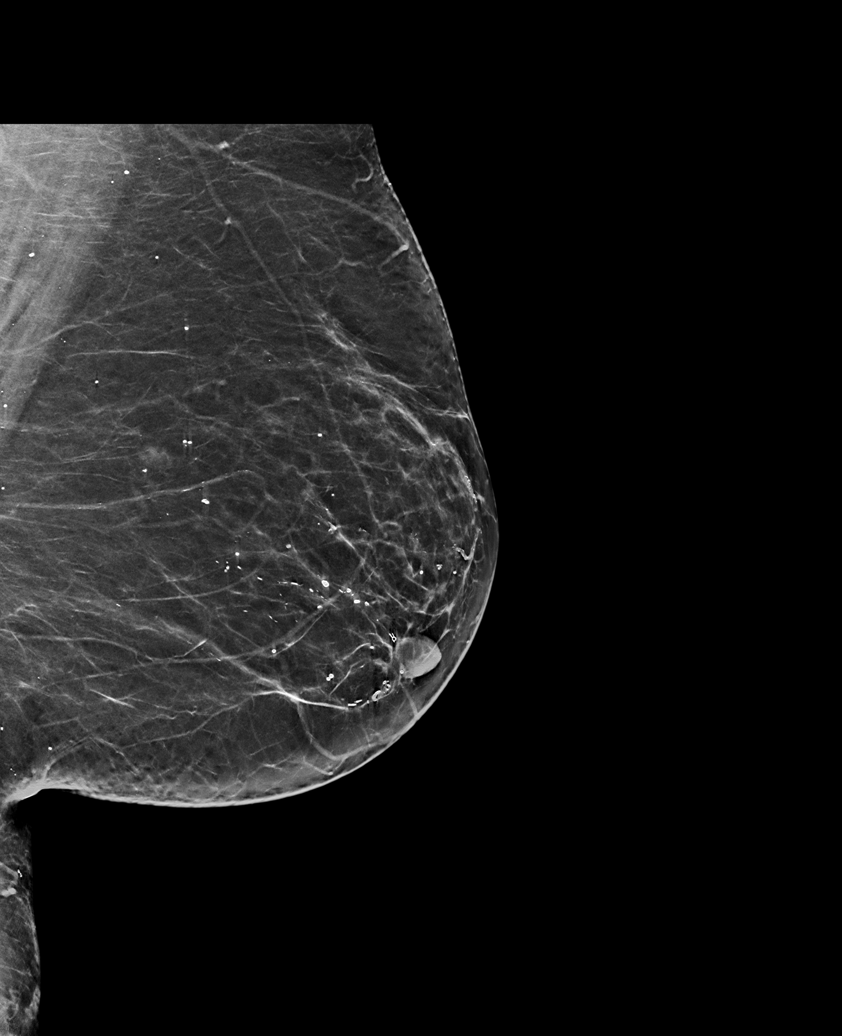

[R CC synth-2D (2 of 2)]
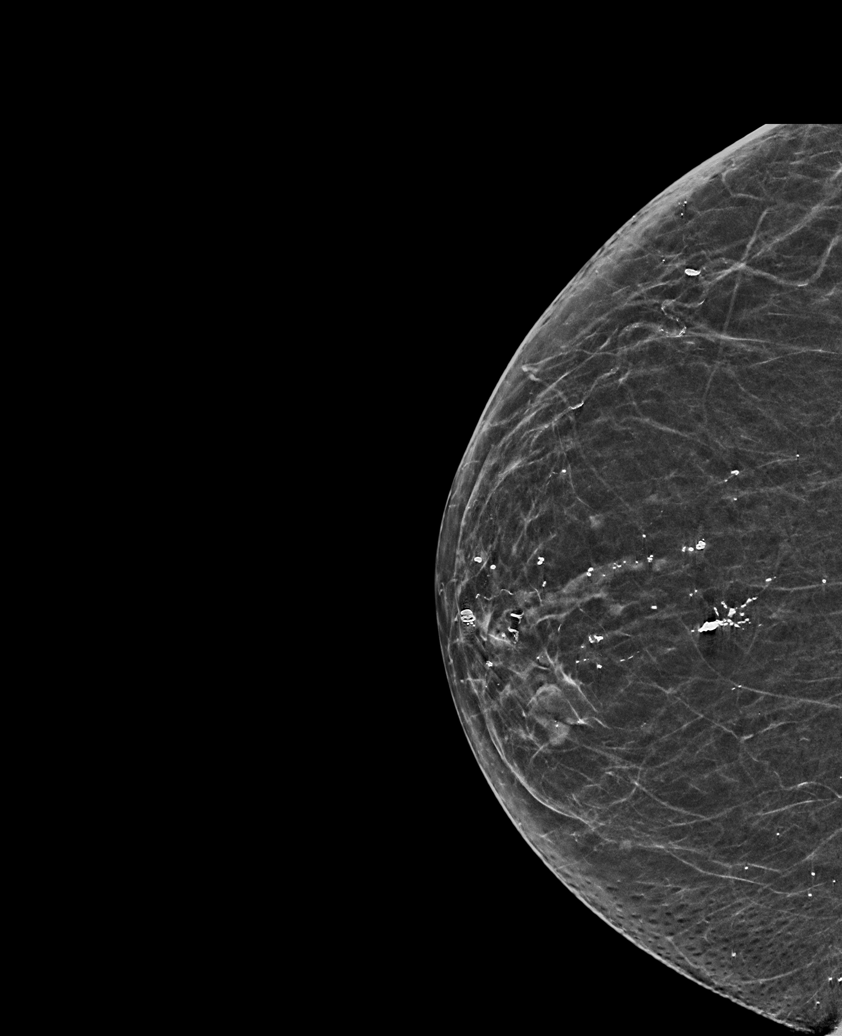

[6 of 36 positions shown; findings below may reference images not displayed]

ACR Breast Density Category b: There are scattered areas of
fibroglandular density.
FINDINGS: In the left breast, a possible mass warrants further evaluation.
This possible mass is seen within the upper inner quadrant of the
LEFT breast, CC slice 22 and MLO slice 21, suspected fat necrosis.

In the right breast, no findings suspicious for malignancy.

Images were processed with CAD.
IMPRESSION: Further evaluation is suggested for possible mass in the left
breast.

RECOMMENDATION:
Diagnostic mammogram and possibly ultrasound of the left breast.
(Code:[id number])

The patient will be contacted regarding the findings, and additional
imaging will be scheduled.

BI-RADS CATEGORY  0: Incomplete. Need additional imaging evaluation
and/or prior mammograms for comparison.

## 2020-05-04 DIAGNOSIS — L97921 Non-pressure chronic ulcer of unspecified part of left lower leg limited to breakdown of skin: Secondary | ICD-10-CM | POA: Diagnosis not present

## 2020-05-04 DIAGNOSIS — T8189XA Other complications of procedures, not elsewhere classified, initial encounter: Secondary | ICD-10-CM | POA: Diagnosis not present

## 2020-05-04 DIAGNOSIS — I871 Compression of vein: Secondary | ICD-10-CM | POA: Diagnosis not present

## 2020-05-04 DIAGNOSIS — I739 Peripheral vascular disease, unspecified: Secondary | ICD-10-CM | POA: Diagnosis not present

## 2020-05-10 DIAGNOSIS — T8189XA Other complications of procedures, not elsewhere classified, initial encounter: Secondary | ICD-10-CM | POA: Diagnosis not present

## 2020-05-10 DIAGNOSIS — S81802A Unspecified open wound, left lower leg, initial encounter: Secondary | ICD-10-CM | POA: Diagnosis not present

## 2020-05-10 DIAGNOSIS — S91302A Unspecified open wound, left foot, initial encounter: Secondary | ICD-10-CM | POA: Diagnosis not present

## 2020-05-12 DIAGNOSIS — Z95820 Peripheral vascular angioplasty status with implants and grafts: Secondary | ICD-10-CM | POA: Diagnosis not present

## 2020-05-12 DIAGNOSIS — I70242 Atherosclerosis of native arteries of left leg with ulceration of calf: Secondary | ICD-10-CM | POA: Diagnosis not present

## 2020-05-12 DIAGNOSIS — E1151 Type 2 diabetes mellitus with diabetic peripheral angiopathy without gangrene: Secondary | ICD-10-CM | POA: Diagnosis not present

## 2020-05-12 DIAGNOSIS — I110 Hypertensive heart disease with heart failure: Secondary | ICD-10-CM | POA: Diagnosis not present

## 2020-05-12 DIAGNOSIS — I050 Rheumatic mitral stenosis: Secondary | ICD-10-CM | POA: Diagnosis not present

## 2020-05-12 DIAGNOSIS — Z79899 Other long term (current) drug therapy: Secondary | ICD-10-CM | POA: Diagnosis not present

## 2020-05-12 DIAGNOSIS — Z7902 Long term (current) use of antithrombotics/antiplatelets: Secondary | ICD-10-CM | POA: Diagnosis not present

## 2020-05-12 DIAGNOSIS — M47812 Spondylosis without myelopathy or radiculopathy, cervical region: Secondary | ICD-10-CM | POA: Diagnosis not present

## 2020-05-12 DIAGNOSIS — E785 Hyperlipidemia, unspecified: Secondary | ICD-10-CM | POA: Diagnosis not present

## 2020-05-12 DIAGNOSIS — Z7984 Long term (current) use of oral hypoglycemic drugs: Secondary | ICD-10-CM | POA: Diagnosis not present

## 2020-05-12 DIAGNOSIS — Z6841 Body Mass Index (BMI) 40.0 and over, adult: Secondary | ICD-10-CM | POA: Diagnosis not present

## 2020-05-12 DIAGNOSIS — E669 Obesity, unspecified: Secondary | ICD-10-CM | POA: Diagnosis not present

## 2020-05-12 DIAGNOSIS — I2510 Atherosclerotic heart disease of native coronary artery without angina pectoris: Secondary | ICD-10-CM | POA: Diagnosis not present

## 2020-05-12 DIAGNOSIS — K5730 Diverticulosis of large intestine without perforation or abscess without bleeding: Secondary | ICD-10-CM | POA: Diagnosis not present

## 2020-05-12 DIAGNOSIS — I0981 Rheumatic heart failure: Secondary | ICD-10-CM | POA: Diagnosis not present

## 2020-05-12 DIAGNOSIS — Z955 Presence of coronary angioplasty implant and graft: Secondary | ICD-10-CM | POA: Diagnosis not present

## 2020-05-12 DIAGNOSIS — T8131XD Disruption of external operation (surgical) wound, not elsewhere classified, subsequent encounter: Secondary | ICD-10-CM | POA: Diagnosis not present

## 2020-05-12 DIAGNOSIS — Z7982 Long term (current) use of aspirin: Secondary | ICD-10-CM | POA: Diagnosis not present

## 2020-05-12 DIAGNOSIS — I5030 Unspecified diastolic (congestive) heart failure: Secondary | ICD-10-CM | POA: Diagnosis not present

## 2020-05-12 DIAGNOSIS — I70244 Atherosclerosis of native arteries of left leg with ulceration of heel and midfoot: Secondary | ICD-10-CM | POA: Diagnosis not present

## 2020-05-12 DIAGNOSIS — J9601 Acute respiratory failure with hypoxia: Secondary | ICD-10-CM | POA: Diagnosis not present

## 2020-05-12 DIAGNOSIS — L97421 Non-pressure chronic ulcer of left heel and midfoot limited to breakdown of skin: Secondary | ICD-10-CM | POA: Diagnosis not present

## 2020-05-12 DIAGNOSIS — L97221 Non-pressure chronic ulcer of left calf limited to breakdown of skin: Secondary | ICD-10-CM | POA: Diagnosis not present

## 2020-05-12 DIAGNOSIS — M7582 Other shoulder lesions, left shoulder: Secondary | ICD-10-CM | POA: Diagnosis not present

## 2020-05-12 DIAGNOSIS — Z96611 Presence of right artificial shoulder joint: Secondary | ICD-10-CM | POA: Diagnosis not present

## 2020-05-16 DIAGNOSIS — L97421 Non-pressure chronic ulcer of left heel and midfoot limited to breakdown of skin: Secondary | ICD-10-CM | POA: Diagnosis not present

## 2020-05-16 DIAGNOSIS — E785 Hyperlipidemia, unspecified: Secondary | ICD-10-CM | POA: Diagnosis not present

## 2020-05-16 DIAGNOSIS — L97221 Non-pressure chronic ulcer of left calf limited to breakdown of skin: Secondary | ICD-10-CM | POA: Diagnosis not present

## 2020-05-16 DIAGNOSIS — J9601 Acute respiratory failure with hypoxia: Secondary | ICD-10-CM | POA: Diagnosis not present

## 2020-05-16 DIAGNOSIS — T8131XD Disruption of external operation (surgical) wound, not elsewhere classified, subsequent encounter: Secondary | ICD-10-CM | POA: Diagnosis not present

## 2020-05-16 DIAGNOSIS — Z955 Presence of coronary angioplasty implant and graft: Secondary | ICD-10-CM | POA: Diagnosis not present

## 2020-05-16 DIAGNOSIS — Z7902 Long term (current) use of antithrombotics/antiplatelets: Secondary | ICD-10-CM | POA: Diagnosis not present

## 2020-05-16 DIAGNOSIS — I110 Hypertensive heart disease with heart failure: Secondary | ICD-10-CM | POA: Diagnosis not present

## 2020-05-16 DIAGNOSIS — Z6841 Body Mass Index (BMI) 40.0 and over, adult: Secondary | ICD-10-CM | POA: Diagnosis not present

## 2020-05-16 DIAGNOSIS — Z7982 Long term (current) use of aspirin: Secondary | ICD-10-CM | POA: Diagnosis not present

## 2020-05-16 DIAGNOSIS — I2510 Atherosclerotic heart disease of native coronary artery without angina pectoris: Secondary | ICD-10-CM | POA: Diagnosis not present

## 2020-05-16 DIAGNOSIS — I70242 Atherosclerosis of native arteries of left leg with ulceration of calf: Secondary | ICD-10-CM | POA: Diagnosis not present

## 2020-05-16 DIAGNOSIS — M47812 Spondylosis without myelopathy or radiculopathy, cervical region: Secondary | ICD-10-CM | POA: Diagnosis not present

## 2020-05-16 DIAGNOSIS — I050 Rheumatic mitral stenosis: Secondary | ICD-10-CM | POA: Diagnosis not present

## 2020-05-16 DIAGNOSIS — K5730 Diverticulosis of large intestine without perforation or abscess without bleeding: Secondary | ICD-10-CM | POA: Diagnosis not present

## 2020-05-16 DIAGNOSIS — Z95820 Peripheral vascular angioplasty status with implants and grafts: Secondary | ICD-10-CM | POA: Diagnosis not present

## 2020-05-16 DIAGNOSIS — I0981 Rheumatic heart failure: Secondary | ICD-10-CM | POA: Diagnosis not present

## 2020-05-16 DIAGNOSIS — Z7984 Long term (current) use of oral hypoglycemic drugs: Secondary | ICD-10-CM | POA: Diagnosis not present

## 2020-05-16 DIAGNOSIS — Z96611 Presence of right artificial shoulder joint: Secondary | ICD-10-CM | POA: Diagnosis not present

## 2020-05-16 DIAGNOSIS — E669 Obesity, unspecified: Secondary | ICD-10-CM | POA: Diagnosis not present

## 2020-05-16 DIAGNOSIS — I5030 Unspecified diastolic (congestive) heart failure: Secondary | ICD-10-CM | POA: Diagnosis not present

## 2020-05-16 DIAGNOSIS — I70244 Atherosclerosis of native arteries of left leg with ulceration of heel and midfoot: Secondary | ICD-10-CM | POA: Diagnosis not present

## 2020-05-16 DIAGNOSIS — E1151 Type 2 diabetes mellitus with diabetic peripheral angiopathy without gangrene: Secondary | ICD-10-CM | POA: Diagnosis not present

## 2020-05-16 DIAGNOSIS — Z79899 Other long term (current) drug therapy: Secondary | ICD-10-CM | POA: Diagnosis not present

## 2020-05-16 DIAGNOSIS — M7582 Other shoulder lesions, left shoulder: Secondary | ICD-10-CM | POA: Diagnosis not present

## 2020-05-30 DIAGNOSIS — Z6834 Body mass index (BMI) 34.0-34.9, adult: Secondary | ICD-10-CM | POA: Diagnosis not present

## 2020-05-30 DIAGNOSIS — R001 Bradycardia, unspecified: Secondary | ICD-10-CM | POA: Diagnosis not present

## 2020-05-30 DIAGNOSIS — Z7902 Long term (current) use of antithrombotics/antiplatelets: Secondary | ICD-10-CM | POA: Diagnosis not present

## 2020-05-30 DIAGNOSIS — I110 Hypertensive heart disease with heart failure: Secondary | ICD-10-CM | POA: Diagnosis not present

## 2020-05-30 DIAGNOSIS — E1151 Type 2 diabetes mellitus with diabetic peripheral angiopathy without gangrene: Secondary | ICD-10-CM | POA: Diagnosis not present

## 2020-05-30 DIAGNOSIS — Z87891 Personal history of nicotine dependence: Secondary | ICD-10-CM | POA: Diagnosis not present

## 2020-05-30 DIAGNOSIS — Z9049 Acquired absence of other specified parts of digestive tract: Secondary | ICD-10-CM | POA: Diagnosis not present

## 2020-05-30 DIAGNOSIS — D509 Iron deficiency anemia, unspecified: Secondary | ICD-10-CM | POA: Diagnosis not present

## 2020-05-30 DIAGNOSIS — Z7984 Long term (current) use of oral hypoglycemic drugs: Secondary | ICD-10-CM | POA: Diagnosis not present

## 2020-05-30 DIAGNOSIS — Z7982 Long term (current) use of aspirin: Secondary | ICD-10-CM | POA: Diagnosis not present

## 2020-05-30 DIAGNOSIS — I509 Heart failure, unspecified: Secondary | ICD-10-CM | POA: Diagnosis not present

## 2020-05-30 DIAGNOSIS — R079 Chest pain, unspecified: Secondary | ICD-10-CM | POA: Diagnosis not present

## 2020-05-31 DIAGNOSIS — Z955 Presence of coronary angioplasty implant and graft: Secondary | ICD-10-CM | POA: Diagnosis not present

## 2020-05-31 DIAGNOSIS — I0981 Rheumatic heart failure: Secondary | ICD-10-CM | POA: Diagnosis not present

## 2020-05-31 DIAGNOSIS — I2510 Atherosclerotic heart disease of native coronary artery without angina pectoris: Secondary | ICD-10-CM | POA: Diagnosis not present

## 2020-05-31 DIAGNOSIS — Z79899 Other long term (current) drug therapy: Secondary | ICD-10-CM | POA: Diagnosis not present

## 2020-05-31 DIAGNOSIS — I5030 Unspecified diastolic (congestive) heart failure: Secondary | ICD-10-CM | POA: Diagnosis not present

## 2020-05-31 DIAGNOSIS — E669 Obesity, unspecified: Secondary | ICD-10-CM | POA: Diagnosis not present

## 2020-05-31 DIAGNOSIS — J9601 Acute respiratory failure with hypoxia: Secondary | ICD-10-CM | POA: Diagnosis not present

## 2020-05-31 DIAGNOSIS — Z96611 Presence of right artificial shoulder joint: Secondary | ICD-10-CM | POA: Diagnosis not present

## 2020-05-31 DIAGNOSIS — Z7982 Long term (current) use of aspirin: Secondary | ICD-10-CM | POA: Diagnosis not present

## 2020-05-31 DIAGNOSIS — M47812 Spondylosis without myelopathy or radiculopathy, cervical region: Secondary | ICD-10-CM | POA: Diagnosis not present

## 2020-05-31 DIAGNOSIS — Z7984 Long term (current) use of oral hypoglycemic drugs: Secondary | ICD-10-CM | POA: Diagnosis not present

## 2020-05-31 DIAGNOSIS — T8131XD Disruption of external operation (surgical) wound, not elsewhere classified, subsequent encounter: Secondary | ICD-10-CM | POA: Diagnosis not present

## 2020-05-31 DIAGNOSIS — I70244 Atherosclerosis of native arteries of left leg with ulceration of heel and midfoot: Secondary | ICD-10-CM | POA: Diagnosis not present

## 2020-05-31 DIAGNOSIS — I70242 Atherosclerosis of native arteries of left leg with ulceration of calf: Secondary | ICD-10-CM | POA: Diagnosis not present

## 2020-05-31 DIAGNOSIS — M7582 Other shoulder lesions, left shoulder: Secondary | ICD-10-CM | POA: Diagnosis not present

## 2020-05-31 DIAGNOSIS — Z7902 Long term (current) use of antithrombotics/antiplatelets: Secondary | ICD-10-CM | POA: Diagnosis not present

## 2020-05-31 DIAGNOSIS — L97421 Non-pressure chronic ulcer of left heel and midfoot limited to breakdown of skin: Secondary | ICD-10-CM | POA: Diagnosis not present

## 2020-05-31 DIAGNOSIS — E1151 Type 2 diabetes mellitus with diabetic peripheral angiopathy without gangrene: Secondary | ICD-10-CM | POA: Diagnosis not present

## 2020-05-31 DIAGNOSIS — K5730 Diverticulosis of large intestine without perforation or abscess without bleeding: Secondary | ICD-10-CM | POA: Diagnosis not present

## 2020-05-31 DIAGNOSIS — I110 Hypertensive heart disease with heart failure: Secondary | ICD-10-CM | POA: Diagnosis not present

## 2020-05-31 DIAGNOSIS — I050 Rheumatic mitral stenosis: Secondary | ICD-10-CM | POA: Diagnosis not present

## 2020-05-31 DIAGNOSIS — E785 Hyperlipidemia, unspecified: Secondary | ICD-10-CM | POA: Diagnosis not present

## 2020-05-31 DIAGNOSIS — Z6841 Body Mass Index (BMI) 40.0 and over, adult: Secondary | ICD-10-CM | POA: Diagnosis not present

## 2020-05-31 DIAGNOSIS — L97221 Non-pressure chronic ulcer of left calf limited to breakdown of skin: Secondary | ICD-10-CM | POA: Diagnosis not present

## 2020-05-31 DIAGNOSIS — Z95820 Peripheral vascular angioplasty status with implants and grafts: Secondary | ICD-10-CM | POA: Diagnosis not present

## 2020-06-06 DIAGNOSIS — I739 Peripheral vascular disease, unspecified: Secondary | ICD-10-CM | POA: Diagnosis not present

## 2020-06-06 DIAGNOSIS — E785 Hyperlipidemia, unspecified: Secondary | ICD-10-CM | POA: Diagnosis not present

## 2020-06-06 DIAGNOSIS — I2510 Atherosclerotic heart disease of native coronary artery without angina pectoris: Secondary | ICD-10-CM | POA: Diagnosis not present

## 2020-06-06 DIAGNOSIS — R001 Bradycardia, unspecified: Secondary | ICD-10-CM | POA: Diagnosis not present

## 2020-06-06 DIAGNOSIS — I10 Essential (primary) hypertension: Secondary | ICD-10-CM | POA: Diagnosis not present

## 2020-06-13 DIAGNOSIS — R001 Bradycardia, unspecified: Secondary | ICD-10-CM | POA: Diagnosis not present

## 2020-06-15 DIAGNOSIS — L97921 Non-pressure chronic ulcer of unspecified part of left lower leg limited to breakdown of skin: Secondary | ICD-10-CM | POA: Diagnosis not present

## 2020-06-15 DIAGNOSIS — I739 Peripheral vascular disease, unspecified: Secondary | ICD-10-CM | POA: Diagnosis not present

## 2020-06-15 DIAGNOSIS — T8189XD Other complications of procedures, not elsewhere classified, subsequent encounter: Secondary | ICD-10-CM | POA: Diagnosis not present

## 2020-06-16 DIAGNOSIS — R001 Bradycardia, unspecified: Secondary | ICD-10-CM | POA: Diagnosis not present

## 2020-06-17 DIAGNOSIS — E785 Hyperlipidemia, unspecified: Secondary | ICD-10-CM | POA: Diagnosis not present

## 2020-06-17 DIAGNOSIS — Z79899 Other long term (current) drug therapy: Secondary | ICD-10-CM | POA: Diagnosis not present

## 2020-06-17 DIAGNOSIS — M47812 Spondylosis without myelopathy or radiculopathy, cervical region: Secondary | ICD-10-CM | POA: Diagnosis not present

## 2020-06-17 DIAGNOSIS — I70242 Atherosclerosis of native arteries of left leg with ulceration of calf: Secondary | ICD-10-CM | POA: Diagnosis not present

## 2020-06-17 DIAGNOSIS — T8131XD Disruption of external operation (surgical) wound, not elsewhere classified, subsequent encounter: Secondary | ICD-10-CM | POA: Diagnosis not present

## 2020-06-17 DIAGNOSIS — Z95820 Peripheral vascular angioplasty status with implants and grafts: Secondary | ICD-10-CM | POA: Diagnosis not present

## 2020-06-17 DIAGNOSIS — Z7902 Long term (current) use of antithrombotics/antiplatelets: Secondary | ICD-10-CM | POA: Diagnosis not present

## 2020-06-17 DIAGNOSIS — I70244 Atherosclerosis of native arteries of left leg with ulceration of heel and midfoot: Secondary | ICD-10-CM | POA: Diagnosis not present

## 2020-06-17 DIAGNOSIS — I110 Hypertensive heart disease with heart failure: Secondary | ICD-10-CM | POA: Diagnosis not present

## 2020-06-17 DIAGNOSIS — I2510 Atherosclerotic heart disease of native coronary artery without angina pectoris: Secondary | ICD-10-CM | POA: Diagnosis not present

## 2020-06-17 DIAGNOSIS — Z96611 Presence of right artificial shoulder joint: Secondary | ICD-10-CM | POA: Diagnosis not present

## 2020-06-17 DIAGNOSIS — I0981 Rheumatic heart failure: Secondary | ICD-10-CM | POA: Diagnosis not present

## 2020-06-17 DIAGNOSIS — R001 Bradycardia, unspecified: Secondary | ICD-10-CM | POA: Insufficient documentation

## 2020-06-17 DIAGNOSIS — M7582 Other shoulder lesions, left shoulder: Secondary | ICD-10-CM | POA: Diagnosis not present

## 2020-06-17 DIAGNOSIS — Z955 Presence of coronary angioplasty implant and graft: Secondary | ICD-10-CM | POA: Diagnosis not present

## 2020-06-17 DIAGNOSIS — I5030 Unspecified diastolic (congestive) heart failure: Secondary | ICD-10-CM | POA: Diagnosis not present

## 2020-06-17 DIAGNOSIS — L97421 Non-pressure chronic ulcer of left heel and midfoot limited to breakdown of skin: Secondary | ICD-10-CM | POA: Diagnosis not present

## 2020-06-17 DIAGNOSIS — L97221 Non-pressure chronic ulcer of left calf limited to breakdown of skin: Secondary | ICD-10-CM | POA: Diagnosis not present

## 2020-06-17 DIAGNOSIS — K5730 Diverticulosis of large intestine without perforation or abscess without bleeding: Secondary | ICD-10-CM | POA: Diagnosis not present

## 2020-06-17 DIAGNOSIS — E1151 Type 2 diabetes mellitus with diabetic peripheral angiopathy without gangrene: Secondary | ICD-10-CM | POA: Diagnosis not present

## 2020-06-17 DIAGNOSIS — I050 Rheumatic mitral stenosis: Secondary | ICD-10-CM | POA: Diagnosis not present

## 2020-06-17 DIAGNOSIS — Z7982 Long term (current) use of aspirin: Secondary | ICD-10-CM | POA: Diagnosis not present

## 2020-06-17 DIAGNOSIS — Z6841 Body Mass Index (BMI) 40.0 and over, adult: Secondary | ICD-10-CM | POA: Diagnosis not present

## 2020-06-17 DIAGNOSIS — J9601 Acute respiratory failure with hypoxia: Secondary | ICD-10-CM | POA: Diagnosis not present

## 2020-06-17 DIAGNOSIS — Z7984 Long term (current) use of oral hypoglycemic drugs: Secondary | ICD-10-CM | POA: Diagnosis not present

## 2020-06-17 DIAGNOSIS — E669 Obesity, unspecified: Secondary | ICD-10-CM | POA: Diagnosis not present

## 2020-06-21 DIAGNOSIS — Z79899 Other long term (current) drug therapy: Secondary | ICD-10-CM | POA: Diagnosis not present

## 2020-06-23 DIAGNOSIS — R001 Bradycardia, unspecified: Secondary | ICD-10-CM | POA: Diagnosis not present

## 2020-06-23 DIAGNOSIS — I4510 Unspecified right bundle-branch block: Secondary | ICD-10-CM | POA: Diagnosis not present

## 2020-06-27 DIAGNOSIS — I4510 Unspecified right bundle-branch block: Secondary | ICD-10-CM | POA: Diagnosis not present

## 2020-06-27 DIAGNOSIS — Z87891 Personal history of nicotine dependence: Secondary | ICD-10-CM | POA: Diagnosis not present

## 2020-06-27 DIAGNOSIS — Z01812 Encounter for preprocedural laboratory examination: Secondary | ICD-10-CM | POA: Diagnosis not present

## 2020-06-27 DIAGNOSIS — I517 Cardiomegaly: Secondary | ICD-10-CM | POA: Diagnosis not present

## 2020-06-27 DIAGNOSIS — I5032 Chronic diastolic (congestive) heart failure: Secondary | ICD-10-CM | POA: Diagnosis not present

## 2020-06-27 DIAGNOSIS — Z7984 Long term (current) use of oral hypoglycemic drugs: Secondary | ICD-10-CM | POA: Diagnosis not present

## 2020-06-27 DIAGNOSIS — E119 Type 2 diabetes mellitus without complications: Secondary | ICD-10-CM | POA: Diagnosis not present

## 2020-06-27 DIAGNOSIS — R9431 Abnormal electrocardiogram [ECG] [EKG]: Secondary | ICD-10-CM | POA: Diagnosis not present

## 2020-06-27 DIAGNOSIS — Z20822 Contact with and (suspected) exposure to covid-19: Secondary | ICD-10-CM | POA: Diagnosis not present

## 2020-06-27 DIAGNOSIS — R001 Bradycardia, unspecified: Secondary | ICD-10-CM | POA: Diagnosis not present

## 2020-06-27 DIAGNOSIS — Z96611 Presence of right artificial shoulder joint: Secondary | ICD-10-CM | POA: Diagnosis not present

## 2020-06-27 DIAGNOSIS — I110 Hypertensive heart disease with heart failure: Secondary | ICD-10-CM | POA: Diagnosis not present

## 2020-06-27 DIAGNOSIS — Z006 Encounter for examination for normal comparison and control in clinical research program: Secondary | ICD-10-CM | POA: Diagnosis not present

## 2020-06-27 DIAGNOSIS — I441 Atrioventricular block, second degree: Secondary | ICD-10-CM | POA: Diagnosis not present

## 2020-06-27 DIAGNOSIS — R918 Other nonspecific abnormal finding of lung field: Secondary | ICD-10-CM | POA: Diagnosis not present

## 2020-06-28 DIAGNOSIS — R918 Other nonspecific abnormal finding of lung field: Secondary | ICD-10-CM | POA: Diagnosis not present

## 2020-06-28 DIAGNOSIS — Z950 Presence of cardiac pacemaker: Secondary | ICD-10-CM | POA: Diagnosis not present

## 2020-06-28 DIAGNOSIS — I4510 Unspecified right bundle-branch block: Secondary | ICD-10-CM | POA: Diagnosis not present

## 2020-06-28 DIAGNOSIS — I441 Atrioventricular block, second degree: Secondary | ICD-10-CM | POA: Diagnosis not present

## 2020-06-30 DIAGNOSIS — I70242 Atherosclerosis of native arteries of left leg with ulceration of calf: Secondary | ICD-10-CM | POA: Diagnosis not present

## 2020-06-30 DIAGNOSIS — M47812 Spondylosis without myelopathy or radiculopathy, cervical region: Secondary | ICD-10-CM | POA: Diagnosis not present

## 2020-06-30 DIAGNOSIS — L97221 Non-pressure chronic ulcer of left calf limited to breakdown of skin: Secondary | ICD-10-CM | POA: Diagnosis not present

## 2020-06-30 DIAGNOSIS — Z7982 Long term (current) use of aspirin: Secondary | ICD-10-CM | POA: Diagnosis not present

## 2020-06-30 DIAGNOSIS — M7582 Other shoulder lesions, left shoulder: Secondary | ICD-10-CM | POA: Diagnosis not present

## 2020-06-30 DIAGNOSIS — E1151 Type 2 diabetes mellitus with diabetic peripheral angiopathy without gangrene: Secondary | ICD-10-CM | POA: Diagnosis not present

## 2020-06-30 DIAGNOSIS — I2510 Atherosclerotic heart disease of native coronary artery without angina pectoris: Secondary | ICD-10-CM | POA: Diagnosis not present

## 2020-06-30 DIAGNOSIS — I70244 Atherosclerosis of native arteries of left leg with ulceration of heel and midfoot: Secondary | ICD-10-CM | POA: Diagnosis not present

## 2020-06-30 DIAGNOSIS — E785 Hyperlipidemia, unspecified: Secondary | ICD-10-CM | POA: Diagnosis not present

## 2020-06-30 DIAGNOSIS — Z6841 Body Mass Index (BMI) 40.0 and over, adult: Secondary | ICD-10-CM | POA: Diagnosis not present

## 2020-06-30 DIAGNOSIS — K5730 Diverticulosis of large intestine without perforation or abscess without bleeding: Secondary | ICD-10-CM | POA: Diagnosis not present

## 2020-06-30 DIAGNOSIS — I5030 Unspecified diastolic (congestive) heart failure: Secondary | ICD-10-CM | POA: Diagnosis not present

## 2020-06-30 DIAGNOSIS — Z95820 Peripheral vascular angioplasty status with implants and grafts: Secondary | ICD-10-CM | POA: Diagnosis not present

## 2020-06-30 DIAGNOSIS — L97421 Non-pressure chronic ulcer of left heel and midfoot limited to breakdown of skin: Secondary | ICD-10-CM | POA: Diagnosis not present

## 2020-06-30 DIAGNOSIS — Z8701 Personal history of pneumonia (recurrent): Secondary | ICD-10-CM | POA: Diagnosis not present

## 2020-06-30 DIAGNOSIS — Z955 Presence of coronary angioplasty implant and graft: Secondary | ICD-10-CM | POA: Diagnosis not present

## 2020-06-30 DIAGNOSIS — Z96611 Presence of right artificial shoulder joint: Secondary | ICD-10-CM | POA: Diagnosis not present

## 2020-06-30 DIAGNOSIS — E669 Obesity, unspecified: Secondary | ICD-10-CM | POA: Diagnosis not present

## 2020-06-30 DIAGNOSIS — Z7902 Long term (current) use of antithrombotics/antiplatelets: Secondary | ICD-10-CM | POA: Diagnosis not present

## 2020-06-30 DIAGNOSIS — Z4800 Encounter for change or removal of nonsurgical wound dressing: Secondary | ICD-10-CM | POA: Diagnosis not present

## 2020-06-30 DIAGNOSIS — Z86010 Personal history of colonic polyps: Secondary | ICD-10-CM | POA: Diagnosis not present

## 2020-06-30 DIAGNOSIS — I050 Rheumatic mitral stenosis: Secondary | ICD-10-CM | POA: Diagnosis not present

## 2020-06-30 DIAGNOSIS — I0981 Rheumatic heart failure: Secondary | ICD-10-CM | POA: Diagnosis not present

## 2020-06-30 DIAGNOSIS — Z7984 Long term (current) use of oral hypoglycemic drugs: Secondary | ICD-10-CM | POA: Diagnosis not present

## 2020-06-30 DIAGNOSIS — I110 Hypertensive heart disease with heart failure: Secondary | ICD-10-CM | POA: Diagnosis not present

## 2020-07-11 DIAGNOSIS — Z79899 Other long term (current) drug therapy: Secondary | ICD-10-CM | POA: Diagnosis not present

## 2020-07-13 DIAGNOSIS — L97421 Non-pressure chronic ulcer of left heel and midfoot limited to breakdown of skin: Secondary | ICD-10-CM | POA: Diagnosis not present

## 2020-07-13 DIAGNOSIS — E669 Obesity, unspecified: Secondary | ICD-10-CM | POA: Diagnosis not present

## 2020-07-13 DIAGNOSIS — Z7984 Long term (current) use of oral hypoglycemic drugs: Secondary | ICD-10-CM | POA: Diagnosis not present

## 2020-07-13 DIAGNOSIS — I70242 Atherosclerosis of native arteries of left leg with ulceration of calf: Secondary | ICD-10-CM | POA: Diagnosis not present

## 2020-07-13 DIAGNOSIS — Z95820 Peripheral vascular angioplasty status with implants and grafts: Secondary | ICD-10-CM | POA: Diagnosis not present

## 2020-07-13 DIAGNOSIS — I110 Hypertensive heart disease with heart failure: Secondary | ICD-10-CM | POA: Diagnosis not present

## 2020-07-13 DIAGNOSIS — I0981 Rheumatic heart failure: Secondary | ICD-10-CM | POA: Diagnosis not present

## 2020-07-13 DIAGNOSIS — Z86010 Personal history of colonic polyps: Secondary | ICD-10-CM | POA: Diagnosis not present

## 2020-07-13 DIAGNOSIS — Z4800 Encounter for change or removal of nonsurgical wound dressing: Secondary | ICD-10-CM | POA: Diagnosis not present

## 2020-07-13 DIAGNOSIS — Z955 Presence of coronary angioplasty implant and graft: Secondary | ICD-10-CM | POA: Diagnosis not present

## 2020-07-13 DIAGNOSIS — I2510 Atherosclerotic heart disease of native coronary artery without angina pectoris: Secondary | ICD-10-CM | POA: Diagnosis not present

## 2020-07-13 DIAGNOSIS — Z7982 Long term (current) use of aspirin: Secondary | ICD-10-CM | POA: Diagnosis not present

## 2020-07-13 DIAGNOSIS — Z7902 Long term (current) use of antithrombotics/antiplatelets: Secondary | ICD-10-CM | POA: Diagnosis not present

## 2020-07-13 DIAGNOSIS — E785 Hyperlipidemia, unspecified: Secondary | ICD-10-CM | POA: Diagnosis not present

## 2020-07-13 DIAGNOSIS — Z6841 Body Mass Index (BMI) 40.0 and over, adult: Secondary | ICD-10-CM | POA: Diagnosis not present

## 2020-07-13 DIAGNOSIS — E1151 Type 2 diabetes mellitus with diabetic peripheral angiopathy without gangrene: Secondary | ICD-10-CM | POA: Diagnosis not present

## 2020-07-13 DIAGNOSIS — L97221 Non-pressure chronic ulcer of left calf limited to breakdown of skin: Secondary | ICD-10-CM | POA: Diagnosis not present

## 2020-07-13 DIAGNOSIS — M7582 Other shoulder lesions, left shoulder: Secondary | ICD-10-CM | POA: Diagnosis not present

## 2020-07-13 DIAGNOSIS — M47812 Spondylosis without myelopathy or radiculopathy, cervical region: Secondary | ICD-10-CM | POA: Diagnosis not present

## 2020-07-13 DIAGNOSIS — Z8701 Personal history of pneumonia (recurrent): Secondary | ICD-10-CM | POA: Diagnosis not present

## 2020-07-13 DIAGNOSIS — I050 Rheumatic mitral stenosis: Secondary | ICD-10-CM | POA: Diagnosis not present

## 2020-07-13 DIAGNOSIS — Z96611 Presence of right artificial shoulder joint: Secondary | ICD-10-CM | POA: Diagnosis not present

## 2020-07-13 DIAGNOSIS — I5030 Unspecified diastolic (congestive) heart failure: Secondary | ICD-10-CM | POA: Diagnosis not present

## 2020-07-13 DIAGNOSIS — K5730 Diverticulosis of large intestine without perforation or abscess without bleeding: Secondary | ICD-10-CM | POA: Diagnosis not present

## 2020-07-13 DIAGNOSIS — I70244 Atherosclerosis of native arteries of left leg with ulceration of heel and midfoot: Secondary | ICD-10-CM | POA: Diagnosis not present

## 2020-07-14 DIAGNOSIS — R600 Localized edema: Secondary | ICD-10-CM | POA: Diagnosis not present

## 2020-07-14 DIAGNOSIS — L97921 Non-pressure chronic ulcer of unspecified part of left lower leg limited to breakdown of skin: Secondary | ICD-10-CM | POA: Diagnosis not present

## 2020-07-14 DIAGNOSIS — I739 Peripheral vascular disease, unspecified: Secondary | ICD-10-CM | POA: Diagnosis not present

## 2020-07-14 DIAGNOSIS — I872 Venous insufficiency (chronic) (peripheral): Secondary | ICD-10-CM | POA: Diagnosis not present

## 2020-07-14 DIAGNOSIS — I10 Essential (primary) hypertension: Secondary | ICD-10-CM | POA: Diagnosis not present

## 2020-07-14 DIAGNOSIS — I890 Lymphedema, not elsewhere classified: Secondary | ICD-10-CM | POA: Diagnosis not present

## 2020-07-14 DIAGNOSIS — M7989 Other specified soft tissue disorders: Secondary | ICD-10-CM | POA: Diagnosis not present

## 2020-07-20 DIAGNOSIS — Z955 Presence of coronary angioplasty implant and graft: Secondary | ICD-10-CM | POA: Diagnosis not present

## 2020-07-20 DIAGNOSIS — M47812 Spondylosis without myelopathy or radiculopathy, cervical region: Secondary | ICD-10-CM | POA: Diagnosis not present

## 2020-07-20 DIAGNOSIS — I5030 Unspecified diastolic (congestive) heart failure: Secondary | ICD-10-CM | POA: Diagnosis not present

## 2020-07-20 DIAGNOSIS — E1151 Type 2 diabetes mellitus with diabetic peripheral angiopathy without gangrene: Secondary | ICD-10-CM | POA: Diagnosis not present

## 2020-07-20 DIAGNOSIS — M7582 Other shoulder lesions, left shoulder: Secondary | ICD-10-CM | POA: Diagnosis not present

## 2020-07-20 DIAGNOSIS — I70242 Atherosclerosis of native arteries of left leg with ulceration of calf: Secondary | ICD-10-CM | POA: Diagnosis not present

## 2020-07-20 DIAGNOSIS — I70244 Atherosclerosis of native arteries of left leg with ulceration of heel and midfoot: Secondary | ICD-10-CM | POA: Diagnosis not present

## 2020-07-20 DIAGNOSIS — Z86010 Personal history of colonic polyps: Secondary | ICD-10-CM | POA: Diagnosis not present

## 2020-07-20 DIAGNOSIS — Z7982 Long term (current) use of aspirin: Secondary | ICD-10-CM | POA: Diagnosis not present

## 2020-07-20 DIAGNOSIS — K5730 Diverticulosis of large intestine without perforation or abscess without bleeding: Secondary | ICD-10-CM | POA: Diagnosis not present

## 2020-07-20 DIAGNOSIS — Z7902 Long term (current) use of antithrombotics/antiplatelets: Secondary | ICD-10-CM | POA: Diagnosis not present

## 2020-07-20 DIAGNOSIS — I2510 Atherosclerotic heart disease of native coronary artery without angina pectoris: Secondary | ICD-10-CM | POA: Diagnosis not present

## 2020-07-20 DIAGNOSIS — Z8701 Personal history of pneumonia (recurrent): Secondary | ICD-10-CM | POA: Diagnosis not present

## 2020-07-20 DIAGNOSIS — E669 Obesity, unspecified: Secondary | ICD-10-CM | POA: Diagnosis not present

## 2020-07-20 DIAGNOSIS — E785 Hyperlipidemia, unspecified: Secondary | ICD-10-CM | POA: Diagnosis not present

## 2020-07-20 DIAGNOSIS — Z4800 Encounter for change or removal of nonsurgical wound dressing: Secondary | ICD-10-CM | POA: Diagnosis not present

## 2020-07-20 DIAGNOSIS — I050 Rheumatic mitral stenosis: Secondary | ICD-10-CM | POA: Diagnosis not present

## 2020-07-20 DIAGNOSIS — L97221 Non-pressure chronic ulcer of left calf limited to breakdown of skin: Secondary | ICD-10-CM | POA: Diagnosis not present

## 2020-07-20 DIAGNOSIS — Z95820 Peripheral vascular angioplasty status with implants and grafts: Secondary | ICD-10-CM | POA: Diagnosis not present

## 2020-07-20 DIAGNOSIS — L97421 Non-pressure chronic ulcer of left heel and midfoot limited to breakdown of skin: Secondary | ICD-10-CM | POA: Diagnosis not present

## 2020-07-20 DIAGNOSIS — Z96611 Presence of right artificial shoulder joint: Secondary | ICD-10-CM | POA: Diagnosis not present

## 2020-07-20 DIAGNOSIS — I0981 Rheumatic heart failure: Secondary | ICD-10-CM | POA: Diagnosis not present

## 2020-07-20 DIAGNOSIS — Z6841 Body Mass Index (BMI) 40.0 and over, adult: Secondary | ICD-10-CM | POA: Diagnosis not present

## 2020-07-20 DIAGNOSIS — I110 Hypertensive heart disease with heart failure: Secondary | ICD-10-CM | POA: Diagnosis not present

## 2020-07-20 DIAGNOSIS — Z7984 Long term (current) use of oral hypoglycemic drugs: Secondary | ICD-10-CM | POA: Diagnosis not present

## 2020-07-26 DIAGNOSIS — S81802A Unspecified open wound, left lower leg, initial encounter: Secondary | ICD-10-CM | POA: Diagnosis not present

## 2020-07-26 DIAGNOSIS — T8189XA Other complications of procedures, not elsewhere classified, initial encounter: Secondary | ICD-10-CM | POA: Diagnosis not present

## 2020-07-26 DIAGNOSIS — I10 Essential (primary) hypertension: Secondary | ICD-10-CM | POA: Diagnosis not present

## 2020-07-26 DIAGNOSIS — E118 Type 2 diabetes mellitus with unspecified complications: Secondary | ICD-10-CM | POA: Diagnosis not present

## 2020-07-26 DIAGNOSIS — S91302A Unspecified open wound, left foot, initial encounter: Secondary | ICD-10-CM | POA: Diagnosis not present

## 2020-07-26 DIAGNOSIS — Z1231 Encounter for screening mammogram for malignant neoplasm of breast: Secondary | ICD-10-CM | POA: Diagnosis not present

## 2020-07-26 DIAGNOSIS — Z23 Encounter for immunization: Secondary | ICD-10-CM | POA: Diagnosis not present

## 2020-07-26 DIAGNOSIS — E119 Type 2 diabetes mellitus without complications: Secondary | ICD-10-CM | POA: Diagnosis not present

## 2020-07-26 DIAGNOSIS — Z79899 Other long term (current) drug therapy: Secondary | ICD-10-CM | POA: Diagnosis not present

## 2020-07-26 DIAGNOSIS — E785 Hyperlipidemia, unspecified: Secondary | ICD-10-CM | POA: Diagnosis not present

## 2020-07-26 DIAGNOSIS — D649 Anemia, unspecified: Secondary | ICD-10-CM | POA: Diagnosis not present

## 2020-07-26 DIAGNOSIS — I2510 Atherosclerotic heart disease of native coronary artery without angina pectoris: Secondary | ICD-10-CM | POA: Diagnosis not present

## 2020-07-27 ENCOUNTER — Other Ambulatory Visit: Payer: Self-pay | Admitting: Internal Medicine

## 2020-07-27 DIAGNOSIS — Z1231 Encounter for screening mammogram for malignant neoplasm of breast: Secondary | ICD-10-CM

## 2020-07-28 DIAGNOSIS — Z7982 Long term (current) use of aspirin: Secondary | ICD-10-CM | POA: Diagnosis not present

## 2020-07-28 DIAGNOSIS — I2510 Atherosclerotic heart disease of native coronary artery without angina pectoris: Secondary | ICD-10-CM | POA: Diagnosis not present

## 2020-07-28 DIAGNOSIS — I0981 Rheumatic heart failure: Secondary | ICD-10-CM | POA: Diagnosis not present

## 2020-07-28 DIAGNOSIS — I70242 Atherosclerosis of native arteries of left leg with ulceration of calf: Secondary | ICD-10-CM | POA: Diagnosis not present

## 2020-07-28 DIAGNOSIS — Z7902 Long term (current) use of antithrombotics/antiplatelets: Secondary | ICD-10-CM | POA: Diagnosis not present

## 2020-07-28 DIAGNOSIS — Z95820 Peripheral vascular angioplasty status with implants and grafts: Secondary | ICD-10-CM | POA: Diagnosis not present

## 2020-07-28 DIAGNOSIS — K5730 Diverticulosis of large intestine without perforation or abscess without bleeding: Secondary | ICD-10-CM | POA: Diagnosis not present

## 2020-07-28 DIAGNOSIS — Z4800 Encounter for change or removal of nonsurgical wound dressing: Secondary | ICD-10-CM | POA: Diagnosis not present

## 2020-07-28 DIAGNOSIS — I5030 Unspecified diastolic (congestive) heart failure: Secondary | ICD-10-CM | POA: Diagnosis not present

## 2020-07-28 DIAGNOSIS — E669 Obesity, unspecified: Secondary | ICD-10-CM | POA: Diagnosis not present

## 2020-07-28 DIAGNOSIS — M7582 Other shoulder lesions, left shoulder: Secondary | ICD-10-CM | POA: Diagnosis not present

## 2020-07-28 DIAGNOSIS — I110 Hypertensive heart disease with heart failure: Secondary | ICD-10-CM | POA: Diagnosis not present

## 2020-07-28 DIAGNOSIS — Z7984 Long term (current) use of oral hypoglycemic drugs: Secondary | ICD-10-CM | POA: Diagnosis not present

## 2020-07-28 DIAGNOSIS — M47812 Spondylosis without myelopathy or radiculopathy, cervical region: Secondary | ICD-10-CM | POA: Diagnosis not present

## 2020-07-28 DIAGNOSIS — I70244 Atherosclerosis of native arteries of left leg with ulceration of heel and midfoot: Secondary | ICD-10-CM | POA: Diagnosis not present

## 2020-07-28 DIAGNOSIS — E1151 Type 2 diabetes mellitus with diabetic peripheral angiopathy without gangrene: Secondary | ICD-10-CM | POA: Diagnosis not present

## 2020-07-28 DIAGNOSIS — Z955 Presence of coronary angioplasty implant and graft: Secondary | ICD-10-CM | POA: Diagnosis not present

## 2020-07-28 DIAGNOSIS — Z96611 Presence of right artificial shoulder joint: Secondary | ICD-10-CM | POA: Diagnosis not present

## 2020-07-28 DIAGNOSIS — E785 Hyperlipidemia, unspecified: Secondary | ICD-10-CM | POA: Diagnosis not present

## 2020-07-28 DIAGNOSIS — Z6841 Body Mass Index (BMI) 40.0 and over, adult: Secondary | ICD-10-CM | POA: Diagnosis not present

## 2020-07-28 DIAGNOSIS — Z8701 Personal history of pneumonia (recurrent): Secondary | ICD-10-CM | POA: Diagnosis not present

## 2020-07-28 DIAGNOSIS — L97221 Non-pressure chronic ulcer of left calf limited to breakdown of skin: Secondary | ICD-10-CM | POA: Diagnosis not present

## 2020-07-28 DIAGNOSIS — Z86010 Personal history of colonic polyps: Secondary | ICD-10-CM | POA: Diagnosis not present

## 2020-07-28 DIAGNOSIS — L97421 Non-pressure chronic ulcer of left heel and midfoot limited to breakdown of skin: Secondary | ICD-10-CM | POA: Diagnosis not present

## 2020-07-28 DIAGNOSIS — I050 Rheumatic mitral stenosis: Secondary | ICD-10-CM | POA: Diagnosis not present

## 2020-08-02 DIAGNOSIS — E785 Hyperlipidemia, unspecified: Secondary | ICD-10-CM | POA: Diagnosis not present

## 2020-08-02 DIAGNOSIS — I739 Peripheral vascular disease, unspecified: Secondary | ICD-10-CM | POA: Diagnosis not present

## 2020-08-02 DIAGNOSIS — T8189XA Other complications of procedures, not elsewhere classified, initial encounter: Secondary | ICD-10-CM | POA: Diagnosis not present

## 2020-08-02 DIAGNOSIS — I10 Essential (primary) hypertension: Secondary | ICD-10-CM | POA: Diagnosis not present

## 2020-08-02 DIAGNOSIS — S81802A Unspecified open wound, left lower leg, initial encounter: Secondary | ICD-10-CM | POA: Diagnosis not present

## 2020-08-02 DIAGNOSIS — S91302A Unspecified open wound, left foot, initial encounter: Secondary | ICD-10-CM | POA: Diagnosis not present

## 2020-08-02 DIAGNOSIS — R001 Bradycardia, unspecified: Secondary | ICD-10-CM | POA: Diagnosis not present

## 2020-08-02 DIAGNOSIS — I342 Nonrheumatic mitral (valve) stenosis: Secondary | ICD-10-CM | POA: Diagnosis not present

## 2020-08-03 DIAGNOSIS — D649 Anemia, unspecified: Secondary | ICD-10-CM | POA: Diagnosis not present

## 2020-08-04 DIAGNOSIS — I739 Peripheral vascular disease, unspecified: Secondary | ICD-10-CM | POA: Diagnosis not present

## 2020-08-04 DIAGNOSIS — I70229 Atherosclerosis of native arteries of extremities with rest pain, unspecified extremity: Secondary | ICD-10-CM | POA: Diagnosis not present

## 2020-08-04 DIAGNOSIS — T8131XD Disruption of external operation (surgical) wound, not elsewhere classified, subsequent encounter: Secondary | ICD-10-CM | POA: Diagnosis not present

## 2020-08-04 DIAGNOSIS — I70462 Atherosclerosis of autologous vein bypass graft(s) of the extremities with gangrene, left leg: Secondary | ICD-10-CM | POA: Diagnosis not present

## 2020-08-11 DIAGNOSIS — I771 Stricture of artery: Secondary | ICD-10-CM | POA: Diagnosis not present

## 2020-08-11 DIAGNOSIS — I70229 Atherosclerosis of native arteries of extremities with rest pain, unspecified extremity: Secondary | ICD-10-CM | POA: Diagnosis not present

## 2020-08-11 DIAGNOSIS — I739 Peripheral vascular disease, unspecified: Secondary | ICD-10-CM | POA: Diagnosis not present

## 2020-08-12 DIAGNOSIS — Z950 Presence of cardiac pacemaker: Secondary | ICD-10-CM

## 2020-08-12 HISTORY — DX: Presence of cardiac pacemaker: Z95.0

## 2020-08-13 DIAGNOSIS — Z20822 Contact with and (suspected) exposure to covid-19: Secondary | ICD-10-CM | POA: Diagnosis not present

## 2020-08-15 DIAGNOSIS — I96 Gangrene, not elsewhere classified: Secondary | ICD-10-CM | POA: Diagnosis not present

## 2020-08-15 DIAGNOSIS — I110 Hypertensive heart disease with heart failure: Secondary | ICD-10-CM | POA: Diagnosis not present

## 2020-08-15 DIAGNOSIS — L97928 Non-pressure chronic ulcer of unspecified part of left lower leg with other specified severity: Secondary | ICD-10-CM | POA: Diagnosis not present

## 2020-08-15 DIAGNOSIS — Z7984 Long term (current) use of oral hypoglycemic drugs: Secondary | ICD-10-CM | POA: Diagnosis not present

## 2020-08-15 DIAGNOSIS — I70249 Atherosclerosis of native arteries of left leg with ulceration of unspecified site: Secondary | ICD-10-CM | POA: Diagnosis not present

## 2020-08-15 DIAGNOSIS — E11622 Type 2 diabetes mellitus with other skin ulcer: Secondary | ICD-10-CM | POA: Diagnosis not present

## 2020-08-15 DIAGNOSIS — Z950 Presence of cardiac pacemaker: Secondary | ICD-10-CM | POA: Diagnosis not present

## 2020-08-15 DIAGNOSIS — I70262 Atherosclerosis of native arteries of extremities with gangrene, left leg: Secondary | ICD-10-CM | POA: Diagnosis not present

## 2020-08-15 DIAGNOSIS — I5032 Chronic diastolic (congestive) heart failure: Secondary | ICD-10-CM | POA: Diagnosis not present

## 2020-08-15 DIAGNOSIS — Z8616 Personal history of COVID-19: Secondary | ICD-10-CM | POA: Diagnosis not present

## 2020-08-15 DIAGNOSIS — E1152 Type 2 diabetes mellitus with diabetic peripheral angiopathy with gangrene: Secondary | ICD-10-CM | POA: Diagnosis not present

## 2020-08-15 DIAGNOSIS — T148XXA Other injury of unspecified body region, initial encounter: Secondary | ICD-10-CM | POA: Diagnosis not present

## 2020-08-15 DIAGNOSIS — Z87891 Personal history of nicotine dependence: Secondary | ICD-10-CM | POA: Diagnosis not present

## 2020-08-15 DIAGNOSIS — Z6836 Body mass index (BMI) 36.0-36.9, adult: Secondary | ICD-10-CM | POA: Diagnosis not present

## 2020-08-15 DIAGNOSIS — I2510 Atherosclerotic heart disease of native coronary artery without angina pectoris: Secondary | ICD-10-CM | POA: Diagnosis not present

## 2020-08-15 DIAGNOSIS — R768 Other specified abnormal immunological findings in serum: Secondary | ICD-10-CM | POA: Diagnosis not present

## 2020-08-15 DIAGNOSIS — Z79899 Other long term (current) drug therapy: Secondary | ICD-10-CM | POA: Diagnosis not present

## 2020-08-15 DIAGNOSIS — E119 Type 2 diabetes mellitus without complications: Secondary | ICD-10-CM | POA: Diagnosis not present

## 2020-08-15 DIAGNOSIS — Z7902 Long term (current) use of antithrombotics/antiplatelets: Secondary | ICD-10-CM | POA: Diagnosis not present

## 2020-08-15 DIAGNOSIS — E668 Other obesity: Secondary | ICD-10-CM | POA: Diagnosis not present

## 2020-08-15 DIAGNOSIS — Z7982 Long term (current) use of aspirin: Secondary | ICD-10-CM | POA: Diagnosis not present

## 2020-08-16 DIAGNOSIS — R768 Other specified abnormal immunological findings in serum: Secondary | ICD-10-CM | POA: Insufficient documentation

## 2020-09-01 DIAGNOSIS — I739 Peripheral vascular disease, unspecified: Secondary | ICD-10-CM | POA: Diagnosis not present

## 2020-09-15 DIAGNOSIS — Z7982 Long term (current) use of aspirin: Secondary | ICD-10-CM | POA: Diagnosis not present

## 2020-09-15 DIAGNOSIS — Z87891 Personal history of nicotine dependence: Secondary | ICD-10-CM | POA: Diagnosis not present

## 2020-09-15 DIAGNOSIS — I70262 Atherosclerosis of native arteries of extremities with gangrene, left leg: Secondary | ICD-10-CM | POA: Diagnosis not present

## 2020-09-15 DIAGNOSIS — G8918 Other acute postprocedural pain: Secondary | ICD-10-CM | POA: Diagnosis not present

## 2020-09-15 DIAGNOSIS — Z79899 Other long term (current) drug therapy: Secondary | ICD-10-CM | POA: Diagnosis not present

## 2020-09-15 DIAGNOSIS — I110 Hypertensive heart disease with heart failure: Secondary | ICD-10-CM | POA: Diagnosis not present

## 2020-09-15 DIAGNOSIS — I10 Essential (primary) hypertension: Secondary | ICD-10-CM | POA: Diagnosis not present

## 2020-09-15 DIAGNOSIS — E1152 Type 2 diabetes mellitus with diabetic peripheral angiopathy with gangrene: Secondary | ICD-10-CM | POA: Diagnosis not present

## 2020-09-15 DIAGNOSIS — D6489 Other specified anemias: Secondary | ICD-10-CM | POA: Diagnosis not present

## 2020-09-15 DIAGNOSIS — Z8616 Personal history of COVID-19: Secondary | ICD-10-CM | POA: Diagnosis not present

## 2020-09-15 DIAGNOSIS — I96 Gangrene, not elsewhere classified: Secondary | ICD-10-CM | POA: Diagnosis not present

## 2020-09-15 DIAGNOSIS — I509 Heart failure, unspecified: Secondary | ICD-10-CM | POA: Diagnosis not present

## 2020-09-15 DIAGNOSIS — Z20822 Contact with and (suspected) exposure to covid-19: Secondary | ICD-10-CM | POA: Diagnosis not present

## 2020-09-15 DIAGNOSIS — Z950 Presence of cardiac pacemaker: Secondary | ICD-10-CM | POA: Diagnosis not present

## 2020-09-15 DIAGNOSIS — Z7984 Long term (current) use of oral hypoglycemic drugs: Secondary | ICD-10-CM | POA: Diagnosis not present

## 2020-09-15 DIAGNOSIS — I2510 Atherosclerotic heart disease of native coronary artery without angina pectoris: Secondary | ICD-10-CM | POA: Diagnosis not present

## 2020-09-15 DIAGNOSIS — I739 Peripheral vascular disease, unspecified: Secondary | ICD-10-CM | POA: Diagnosis not present

## 2020-09-15 DIAGNOSIS — E785 Hyperlipidemia, unspecified: Secondary | ICD-10-CM | POA: Diagnosis not present

## 2020-09-15 DIAGNOSIS — Z7902 Long term (current) use of antithrombotics/antiplatelets: Secondary | ICD-10-CM | POA: Diagnosis not present

## 2020-09-15 DIAGNOSIS — I70229 Atherosclerosis of native arteries of extremities with rest pain, unspecified extremity: Secondary | ICD-10-CM | POA: Diagnosis not present

## 2020-09-15 DIAGNOSIS — Z91048 Other nonmedicinal substance allergy status: Secondary | ICD-10-CM | POA: Diagnosis not present

## 2020-09-15 DIAGNOSIS — Z8701 Personal history of pneumonia (recurrent): Secondary | ICD-10-CM | POA: Diagnosis not present

## 2020-09-15 DIAGNOSIS — Z9889 Other specified postprocedural states: Secondary | ICD-10-CM | POA: Diagnosis not present

## 2020-09-17 DIAGNOSIS — I739 Peripheral vascular disease, unspecified: Secondary | ICD-10-CM | POA: Diagnosis not present

## 2020-09-17 DIAGNOSIS — Z9889 Other specified postprocedural states: Secondary | ICD-10-CM | POA: Diagnosis not present

## 2020-09-17 DIAGNOSIS — I70229 Atherosclerosis of native arteries of extremities with rest pain, unspecified extremity: Secondary | ICD-10-CM | POA: Diagnosis not present

## 2020-09-19 HISTORY — PX: ABOVE KNEE LEG AMPUTATION: SUR20

## 2020-09-27 ENCOUNTER — Inpatient Hospital Stay: Admission: RE | Admit: 2020-09-27 | Payer: PPO | Source: Ambulatory Visit

## 2020-09-28 DIAGNOSIS — Z4781 Encounter for orthopedic aftercare following surgical amputation: Secondary | ICD-10-CM | POA: Diagnosis not present

## 2020-09-28 DIAGNOSIS — I70222 Atherosclerosis of native arteries of extremities with rest pain, left leg: Secondary | ICD-10-CM | POA: Diagnosis not present

## 2020-09-28 DIAGNOSIS — Z5189 Encounter for other specified aftercare: Secondary | ICD-10-CM | POA: Diagnosis not present

## 2020-09-28 DIAGNOSIS — E669 Obesity, unspecified: Secondary | ICD-10-CM | POA: Diagnosis not present

## 2020-09-28 DIAGNOSIS — Z7984 Long term (current) use of oral hypoglycemic drugs: Secondary | ICD-10-CM | POA: Diagnosis not present

## 2020-09-28 DIAGNOSIS — R52 Pain, unspecified: Secondary | ICD-10-CM | POA: Diagnosis not present

## 2020-09-28 DIAGNOSIS — R5381 Other malaise: Secondary | ICD-10-CM | POA: Diagnosis not present

## 2020-09-28 DIAGNOSIS — E785 Hyperlipidemia, unspecified: Secondary | ICD-10-CM | POA: Diagnosis not present

## 2020-09-28 DIAGNOSIS — Z8719 Personal history of other diseases of the digestive system: Secondary | ICD-10-CM | POA: Diagnosis not present

## 2020-09-28 DIAGNOSIS — Z993 Dependence on wheelchair: Secondary | ICD-10-CM | POA: Diagnosis not present

## 2020-09-28 DIAGNOSIS — Z950 Presence of cardiac pacemaker: Secondary | ICD-10-CM | POA: Diagnosis not present

## 2020-09-28 DIAGNOSIS — I509 Heart failure, unspecified: Secondary | ICD-10-CM | POA: Diagnosis not present

## 2020-09-28 DIAGNOSIS — E1151 Type 2 diabetes mellitus with diabetic peripheral angiopathy without gangrene: Secondary | ICD-10-CM | POA: Diagnosis not present

## 2020-09-28 DIAGNOSIS — Z89612 Acquired absence of left leg above knee: Secondary | ICD-10-CM | POA: Insufficient documentation

## 2020-09-28 DIAGNOSIS — Z9861 Coronary angioplasty status: Secondary | ICD-10-CM | POA: Diagnosis not present

## 2020-09-28 DIAGNOSIS — Z6833 Body mass index (BMI) 33.0-33.9, adult: Secondary | ICD-10-CM | POA: Diagnosis not present

## 2020-09-28 DIAGNOSIS — Z794 Long term (current) use of insulin: Secondary | ICD-10-CM | POA: Diagnosis not present

## 2020-09-28 DIAGNOSIS — I4510 Unspecified right bundle-branch block: Secondary | ICD-10-CM | POA: Diagnosis not present

## 2020-09-28 DIAGNOSIS — Z96611 Presence of right artificial shoulder joint: Secondary | ICD-10-CM | POA: Diagnosis not present

## 2020-09-28 DIAGNOSIS — Z8616 Personal history of COVID-19: Secondary | ICD-10-CM | POA: Diagnosis not present

## 2020-09-28 DIAGNOSIS — Z87891 Personal history of nicotine dependence: Secondary | ICD-10-CM | POA: Diagnosis not present

## 2020-09-28 DIAGNOSIS — Z8701 Personal history of pneumonia (recurrent): Secondary | ICD-10-CM | POA: Diagnosis not present

## 2020-09-28 DIAGNOSIS — I110 Hypertensive heart disease with heart failure: Secondary | ICD-10-CM | POA: Diagnosis not present

## 2020-09-28 DIAGNOSIS — I2510 Atherosclerotic heart disease of native coronary artery without angina pectoris: Secondary | ICD-10-CM | POA: Diagnosis not present

## 2020-10-05 DIAGNOSIS — Z8701 Personal history of pneumonia (recurrent): Secondary | ICD-10-CM | POA: Diagnosis not present

## 2020-10-05 DIAGNOSIS — Z89612 Acquired absence of left leg above knee: Secondary | ICD-10-CM | POA: Diagnosis not present

## 2020-10-05 DIAGNOSIS — Z9181 History of falling: Secondary | ICD-10-CM | POA: Diagnosis not present

## 2020-10-05 DIAGNOSIS — M47812 Spondylosis without myelopathy or radiculopathy, cervical region: Secondary | ICD-10-CM | POA: Diagnosis not present

## 2020-10-05 DIAGNOSIS — I509 Heart failure, unspecified: Secondary | ICD-10-CM | POA: Diagnosis not present

## 2020-10-05 DIAGNOSIS — I4510 Unspecified right bundle-branch block: Secondary | ICD-10-CM | POA: Diagnosis not present

## 2020-10-05 DIAGNOSIS — Z950 Presence of cardiac pacemaker: Secondary | ICD-10-CM | POA: Diagnosis not present

## 2020-10-05 DIAGNOSIS — I110 Hypertensive heart disease with heart failure: Secondary | ICD-10-CM | POA: Diagnosis not present

## 2020-10-05 DIAGNOSIS — D509 Iron deficiency anemia, unspecified: Secondary | ICD-10-CM | POA: Diagnosis not present

## 2020-10-05 DIAGNOSIS — Z4781 Encounter for orthopedic aftercare following surgical amputation: Secondary | ICD-10-CM | POA: Diagnosis not present

## 2020-10-05 DIAGNOSIS — E1151 Type 2 diabetes mellitus with diabetic peripheral angiopathy without gangrene: Secondary | ICD-10-CM | POA: Diagnosis not present

## 2020-10-05 DIAGNOSIS — Z96611 Presence of right artificial shoulder joint: Secondary | ICD-10-CM | POA: Diagnosis not present

## 2020-10-05 DIAGNOSIS — E669 Obesity, unspecified: Secondary | ICD-10-CM | POA: Diagnosis not present

## 2020-10-05 DIAGNOSIS — K5790 Diverticulosis of intestine, part unspecified, without perforation or abscess without bleeding: Secondary | ICD-10-CM | POA: Diagnosis not present

## 2020-10-05 DIAGNOSIS — I2510 Atherosclerotic heart disease of native coronary artery without angina pectoris: Secondary | ICD-10-CM | POA: Diagnosis not present

## 2020-10-05 DIAGNOSIS — Z8616 Personal history of COVID-19: Secondary | ICD-10-CM | POA: Diagnosis not present

## 2020-10-05 DIAGNOSIS — E785 Hyperlipidemia, unspecified: Secondary | ICD-10-CM | POA: Diagnosis not present

## 2020-10-05 DIAGNOSIS — Z6829 Body mass index (BMI) 29.0-29.9, adult: Secondary | ICD-10-CM | POA: Diagnosis not present

## 2020-10-05 DIAGNOSIS — Z87891 Personal history of nicotine dependence: Secondary | ICD-10-CM | POA: Diagnosis not present

## 2020-10-13 DIAGNOSIS — K5790 Diverticulosis of intestine, part unspecified, without perforation or abscess without bleeding: Secondary | ICD-10-CM | POA: Diagnosis not present

## 2020-10-13 DIAGNOSIS — D509 Iron deficiency anemia, unspecified: Secondary | ICD-10-CM | POA: Diagnosis not present

## 2020-10-13 DIAGNOSIS — E669 Obesity, unspecified: Secondary | ICD-10-CM | POA: Diagnosis not present

## 2020-10-13 DIAGNOSIS — I4510 Unspecified right bundle-branch block: Secondary | ICD-10-CM | POA: Diagnosis not present

## 2020-10-13 DIAGNOSIS — M47812 Spondylosis without myelopathy or radiculopathy, cervical region: Secondary | ICD-10-CM | POA: Diagnosis not present

## 2020-10-13 DIAGNOSIS — Z89612 Acquired absence of left leg above knee: Secondary | ICD-10-CM | POA: Diagnosis not present

## 2020-10-13 DIAGNOSIS — E1151 Type 2 diabetes mellitus with diabetic peripheral angiopathy without gangrene: Secondary | ICD-10-CM | POA: Diagnosis not present

## 2020-10-13 DIAGNOSIS — I509 Heart failure, unspecified: Secondary | ICD-10-CM | POA: Diagnosis not present

## 2020-10-13 DIAGNOSIS — Z87891 Personal history of nicotine dependence: Secondary | ICD-10-CM | POA: Diagnosis not present

## 2020-10-13 DIAGNOSIS — Z6829 Body mass index (BMI) 29.0-29.9, adult: Secondary | ICD-10-CM | POA: Diagnosis not present

## 2020-10-13 DIAGNOSIS — Z8701 Personal history of pneumonia (recurrent): Secondary | ICD-10-CM | POA: Diagnosis not present

## 2020-10-13 DIAGNOSIS — Z4781 Encounter for orthopedic aftercare following surgical amputation: Secondary | ICD-10-CM | POA: Diagnosis not present

## 2020-10-13 DIAGNOSIS — Z9181 History of falling: Secondary | ICD-10-CM | POA: Diagnosis not present

## 2020-10-13 DIAGNOSIS — Z950 Presence of cardiac pacemaker: Secondary | ICD-10-CM | POA: Diagnosis not present

## 2020-10-13 DIAGNOSIS — Z8616 Personal history of COVID-19: Secondary | ICD-10-CM | POA: Diagnosis not present

## 2020-10-13 DIAGNOSIS — I110 Hypertensive heart disease with heart failure: Secondary | ICD-10-CM | POA: Diagnosis not present

## 2020-10-13 DIAGNOSIS — Z96611 Presence of right artificial shoulder joint: Secondary | ICD-10-CM | POA: Diagnosis not present

## 2020-10-13 DIAGNOSIS — I2510 Atherosclerotic heart disease of native coronary artery without angina pectoris: Secondary | ICD-10-CM | POA: Diagnosis not present

## 2020-10-13 DIAGNOSIS — E785 Hyperlipidemia, unspecified: Secondary | ICD-10-CM | POA: Diagnosis not present

## 2020-10-17 DIAGNOSIS — Z87891 Personal history of nicotine dependence: Secondary | ICD-10-CM | POA: Diagnosis not present

## 2020-10-17 DIAGNOSIS — D509 Iron deficiency anemia, unspecified: Secondary | ICD-10-CM | POA: Diagnosis not present

## 2020-10-17 DIAGNOSIS — Z96611 Presence of right artificial shoulder joint: Secondary | ICD-10-CM | POA: Diagnosis not present

## 2020-10-17 DIAGNOSIS — K5790 Diverticulosis of intestine, part unspecified, without perforation or abscess without bleeding: Secondary | ICD-10-CM | POA: Diagnosis not present

## 2020-10-17 DIAGNOSIS — I2510 Atherosclerotic heart disease of native coronary artery without angina pectoris: Secondary | ICD-10-CM | POA: Diagnosis not present

## 2020-10-17 DIAGNOSIS — Z9181 History of falling: Secondary | ICD-10-CM | POA: Diagnosis not present

## 2020-10-17 DIAGNOSIS — Z4781 Encounter for orthopedic aftercare following surgical amputation: Secondary | ICD-10-CM | POA: Diagnosis not present

## 2020-10-17 DIAGNOSIS — E669 Obesity, unspecified: Secondary | ICD-10-CM | POA: Diagnosis not present

## 2020-10-17 DIAGNOSIS — Z89612 Acquired absence of left leg above knee: Secondary | ICD-10-CM | POA: Diagnosis not present

## 2020-10-17 DIAGNOSIS — E785 Hyperlipidemia, unspecified: Secondary | ICD-10-CM | POA: Diagnosis not present

## 2020-10-17 DIAGNOSIS — M47812 Spondylosis without myelopathy or radiculopathy, cervical region: Secondary | ICD-10-CM | POA: Diagnosis not present

## 2020-10-17 DIAGNOSIS — Z8701 Personal history of pneumonia (recurrent): Secondary | ICD-10-CM | POA: Diagnosis not present

## 2020-10-17 DIAGNOSIS — E1151 Type 2 diabetes mellitus with diabetic peripheral angiopathy without gangrene: Secondary | ICD-10-CM | POA: Diagnosis not present

## 2020-10-17 DIAGNOSIS — I509 Heart failure, unspecified: Secondary | ICD-10-CM | POA: Diagnosis not present

## 2020-10-17 DIAGNOSIS — Z8616 Personal history of COVID-19: Secondary | ICD-10-CM | POA: Diagnosis not present

## 2020-10-17 DIAGNOSIS — Z950 Presence of cardiac pacemaker: Secondary | ICD-10-CM | POA: Diagnosis not present

## 2020-10-17 DIAGNOSIS — I4510 Unspecified right bundle-branch block: Secondary | ICD-10-CM | POA: Diagnosis not present

## 2020-10-17 DIAGNOSIS — Z6829 Body mass index (BMI) 29.0-29.9, adult: Secondary | ICD-10-CM | POA: Diagnosis not present

## 2020-10-17 DIAGNOSIS — I110 Hypertensive heart disease with heart failure: Secondary | ICD-10-CM | POA: Diagnosis not present

## 2020-10-21 DIAGNOSIS — M47812 Spondylosis without myelopathy or radiculopathy, cervical region: Secondary | ICD-10-CM | POA: Diagnosis not present

## 2020-10-21 DIAGNOSIS — I509 Heart failure, unspecified: Secondary | ICD-10-CM | POA: Diagnosis not present

## 2020-10-21 DIAGNOSIS — Z4781 Encounter for orthopedic aftercare following surgical amputation: Secondary | ICD-10-CM | POA: Diagnosis not present

## 2020-10-21 DIAGNOSIS — Z9181 History of falling: Secondary | ICD-10-CM | POA: Diagnosis not present

## 2020-10-21 DIAGNOSIS — Z8701 Personal history of pneumonia (recurrent): Secondary | ICD-10-CM | POA: Diagnosis not present

## 2020-10-21 DIAGNOSIS — Z6829 Body mass index (BMI) 29.0-29.9, adult: Secondary | ICD-10-CM | POA: Diagnosis not present

## 2020-10-21 DIAGNOSIS — E669 Obesity, unspecified: Secondary | ICD-10-CM | POA: Diagnosis not present

## 2020-10-21 DIAGNOSIS — Z96611 Presence of right artificial shoulder joint: Secondary | ICD-10-CM | POA: Diagnosis not present

## 2020-10-21 DIAGNOSIS — Z8616 Personal history of COVID-19: Secondary | ICD-10-CM | POA: Diagnosis not present

## 2020-10-21 DIAGNOSIS — E1151 Type 2 diabetes mellitus with diabetic peripheral angiopathy without gangrene: Secondary | ICD-10-CM | POA: Diagnosis not present

## 2020-10-21 DIAGNOSIS — K5790 Diverticulosis of intestine, part unspecified, without perforation or abscess without bleeding: Secondary | ICD-10-CM | POA: Diagnosis not present

## 2020-10-21 DIAGNOSIS — I4510 Unspecified right bundle-branch block: Secondary | ICD-10-CM | POA: Diagnosis not present

## 2020-10-21 DIAGNOSIS — I2510 Atherosclerotic heart disease of native coronary artery without angina pectoris: Secondary | ICD-10-CM | POA: Diagnosis not present

## 2020-10-21 DIAGNOSIS — D509 Iron deficiency anemia, unspecified: Secondary | ICD-10-CM | POA: Diagnosis not present

## 2020-10-21 DIAGNOSIS — Z950 Presence of cardiac pacemaker: Secondary | ICD-10-CM | POA: Diagnosis not present

## 2020-10-21 DIAGNOSIS — Z87891 Personal history of nicotine dependence: Secondary | ICD-10-CM | POA: Diagnosis not present

## 2020-10-21 DIAGNOSIS — I110 Hypertensive heart disease with heart failure: Secondary | ICD-10-CM | POA: Diagnosis not present

## 2020-10-21 DIAGNOSIS — Z89612 Acquired absence of left leg above knee: Secondary | ICD-10-CM | POA: Diagnosis not present

## 2020-10-21 DIAGNOSIS — E785 Hyperlipidemia, unspecified: Secondary | ICD-10-CM | POA: Diagnosis not present

## 2020-10-25 DIAGNOSIS — E1151 Type 2 diabetes mellitus with diabetic peripheral angiopathy without gangrene: Secondary | ICD-10-CM | POA: Diagnosis not present

## 2020-10-25 DIAGNOSIS — Z4781 Encounter for orthopedic aftercare following surgical amputation: Secondary | ICD-10-CM | POA: Diagnosis not present

## 2020-10-25 DIAGNOSIS — I4510 Unspecified right bundle-branch block: Secondary | ICD-10-CM | POA: Diagnosis not present

## 2020-10-25 DIAGNOSIS — E669 Obesity, unspecified: Secondary | ICD-10-CM | POA: Diagnosis not present

## 2020-10-25 DIAGNOSIS — D509 Iron deficiency anemia, unspecified: Secondary | ICD-10-CM | POA: Diagnosis not present

## 2020-10-25 DIAGNOSIS — Z8701 Personal history of pneumonia (recurrent): Secondary | ICD-10-CM | POA: Diagnosis not present

## 2020-10-25 DIAGNOSIS — Z8616 Personal history of COVID-19: Secondary | ICD-10-CM | POA: Diagnosis not present

## 2020-10-25 DIAGNOSIS — E785 Hyperlipidemia, unspecified: Secondary | ICD-10-CM | POA: Diagnosis not present

## 2020-10-25 DIAGNOSIS — Z9181 History of falling: Secondary | ICD-10-CM | POA: Diagnosis not present

## 2020-10-25 DIAGNOSIS — I110 Hypertensive heart disease with heart failure: Secondary | ICD-10-CM | POA: Diagnosis not present

## 2020-10-25 DIAGNOSIS — Z96611 Presence of right artificial shoulder joint: Secondary | ICD-10-CM | POA: Diagnosis not present

## 2020-10-25 DIAGNOSIS — M47812 Spondylosis without myelopathy or radiculopathy, cervical region: Secondary | ICD-10-CM | POA: Diagnosis not present

## 2020-10-25 DIAGNOSIS — Z89612 Acquired absence of left leg above knee: Secondary | ICD-10-CM | POA: Diagnosis not present

## 2020-10-25 DIAGNOSIS — Z950 Presence of cardiac pacemaker: Secondary | ICD-10-CM | POA: Diagnosis not present

## 2020-10-25 DIAGNOSIS — I2510 Atherosclerotic heart disease of native coronary artery without angina pectoris: Secondary | ICD-10-CM | POA: Diagnosis not present

## 2020-10-25 DIAGNOSIS — K5790 Diverticulosis of intestine, part unspecified, without perforation or abscess without bleeding: Secondary | ICD-10-CM | POA: Diagnosis not present

## 2020-10-25 DIAGNOSIS — I509 Heart failure, unspecified: Secondary | ICD-10-CM | POA: Diagnosis not present

## 2020-10-25 DIAGNOSIS — Z87891 Personal history of nicotine dependence: Secondary | ICD-10-CM | POA: Diagnosis not present

## 2020-10-25 DIAGNOSIS — Z6829 Body mass index (BMI) 29.0-29.9, adult: Secondary | ICD-10-CM | POA: Diagnosis not present

## 2020-11-03 DIAGNOSIS — Z89612 Acquired absence of left leg above knee: Secondary | ICD-10-CM | POA: Diagnosis not present

## 2020-11-11 DIAGNOSIS — Z4781 Encounter for orthopedic aftercare following surgical amputation: Secondary | ICD-10-CM | POA: Diagnosis not present

## 2020-11-11 DIAGNOSIS — Z89612 Acquired absence of left leg above knee: Secondary | ICD-10-CM | POA: Diagnosis not present

## 2020-11-21 DIAGNOSIS — D509 Iron deficiency anemia, unspecified: Secondary | ICD-10-CM | POA: Diagnosis not present

## 2020-11-21 DIAGNOSIS — E785 Hyperlipidemia, unspecified: Secondary | ICD-10-CM | POA: Diagnosis not present

## 2020-11-21 DIAGNOSIS — Z950 Presence of cardiac pacemaker: Secondary | ICD-10-CM | POA: Diagnosis not present

## 2020-11-21 DIAGNOSIS — K5790 Diverticulosis of intestine, part unspecified, without perforation or abscess without bleeding: Secondary | ICD-10-CM | POA: Diagnosis not present

## 2020-11-21 DIAGNOSIS — I110 Hypertensive heart disease with heart failure: Secondary | ICD-10-CM | POA: Diagnosis not present

## 2020-11-21 DIAGNOSIS — Z96611 Presence of right artificial shoulder joint: Secondary | ICD-10-CM | POA: Diagnosis not present

## 2020-11-21 DIAGNOSIS — I4510 Unspecified right bundle-branch block: Secondary | ICD-10-CM | POA: Diagnosis not present

## 2020-11-21 DIAGNOSIS — Z4781 Encounter for orthopedic aftercare following surgical amputation: Secondary | ICD-10-CM | POA: Diagnosis not present

## 2020-11-21 DIAGNOSIS — Z89612 Acquired absence of left leg above knee: Secondary | ICD-10-CM | POA: Diagnosis not present

## 2020-11-21 DIAGNOSIS — I509 Heart failure, unspecified: Secondary | ICD-10-CM | POA: Diagnosis not present

## 2020-11-21 DIAGNOSIS — E669 Obesity, unspecified: Secondary | ICD-10-CM | POA: Diagnosis not present

## 2020-11-21 DIAGNOSIS — Z8616 Personal history of COVID-19: Secondary | ICD-10-CM | POA: Diagnosis not present

## 2020-11-21 DIAGNOSIS — Z87891 Personal history of nicotine dependence: Secondary | ICD-10-CM | POA: Diagnosis not present

## 2020-11-21 DIAGNOSIS — Z8701 Personal history of pneumonia (recurrent): Secondary | ICD-10-CM | POA: Diagnosis not present

## 2020-11-21 DIAGNOSIS — M47812 Spondylosis without myelopathy or radiculopathy, cervical region: Secondary | ICD-10-CM | POA: Diagnosis not present

## 2020-11-21 DIAGNOSIS — I2510 Atherosclerotic heart disease of native coronary artery without angina pectoris: Secondary | ICD-10-CM | POA: Diagnosis not present

## 2020-11-21 DIAGNOSIS — Z6829 Body mass index (BMI) 29.0-29.9, adult: Secondary | ICD-10-CM | POA: Diagnosis not present

## 2020-11-21 DIAGNOSIS — E1151 Type 2 diabetes mellitus with diabetic peripheral angiopathy without gangrene: Secondary | ICD-10-CM | POA: Diagnosis not present

## 2020-11-21 DIAGNOSIS — Z9181 History of falling: Secondary | ICD-10-CM | POA: Diagnosis not present

## 2020-11-25 DIAGNOSIS — Z950 Presence of cardiac pacemaker: Secondary | ICD-10-CM | POA: Diagnosis not present

## 2020-11-25 DIAGNOSIS — I110 Hypertensive heart disease with heart failure: Secondary | ICD-10-CM | POA: Diagnosis not present

## 2020-11-25 DIAGNOSIS — Z6829 Body mass index (BMI) 29.0-29.9, adult: Secondary | ICD-10-CM | POA: Diagnosis not present

## 2020-11-25 DIAGNOSIS — Z96611 Presence of right artificial shoulder joint: Secondary | ICD-10-CM | POA: Diagnosis not present

## 2020-11-25 DIAGNOSIS — Z87891 Personal history of nicotine dependence: Secondary | ICD-10-CM | POA: Diagnosis not present

## 2020-11-25 DIAGNOSIS — I2510 Atherosclerotic heart disease of native coronary artery without angina pectoris: Secondary | ICD-10-CM | POA: Diagnosis not present

## 2020-11-25 DIAGNOSIS — E1151 Type 2 diabetes mellitus with diabetic peripheral angiopathy without gangrene: Secondary | ICD-10-CM | POA: Diagnosis not present

## 2020-11-25 DIAGNOSIS — K5790 Diverticulosis of intestine, part unspecified, without perforation or abscess without bleeding: Secondary | ICD-10-CM | POA: Diagnosis not present

## 2020-11-25 DIAGNOSIS — M47812 Spondylosis without myelopathy or radiculopathy, cervical region: Secondary | ICD-10-CM | POA: Diagnosis not present

## 2020-11-25 DIAGNOSIS — E785 Hyperlipidemia, unspecified: Secondary | ICD-10-CM | POA: Diagnosis not present

## 2020-11-25 DIAGNOSIS — I509 Heart failure, unspecified: Secondary | ICD-10-CM | POA: Diagnosis not present

## 2020-11-25 DIAGNOSIS — Z4781 Encounter for orthopedic aftercare following surgical amputation: Secondary | ICD-10-CM | POA: Diagnosis not present

## 2020-11-25 DIAGNOSIS — Z8616 Personal history of COVID-19: Secondary | ICD-10-CM | POA: Diagnosis not present

## 2020-11-25 DIAGNOSIS — Z9181 History of falling: Secondary | ICD-10-CM | POA: Diagnosis not present

## 2020-11-25 DIAGNOSIS — D509 Iron deficiency anemia, unspecified: Secondary | ICD-10-CM | POA: Diagnosis not present

## 2020-11-25 DIAGNOSIS — Z8701 Personal history of pneumonia (recurrent): Secondary | ICD-10-CM | POA: Diagnosis not present

## 2020-11-25 DIAGNOSIS — Z89612 Acquired absence of left leg above knee: Secondary | ICD-10-CM | POA: Diagnosis not present

## 2020-11-25 DIAGNOSIS — E669 Obesity, unspecified: Secondary | ICD-10-CM | POA: Diagnosis not present

## 2020-11-25 DIAGNOSIS — I4510 Unspecified right bundle-branch block: Secondary | ICD-10-CM | POA: Diagnosis not present

## 2020-12-04 DIAGNOSIS — Z89612 Acquired absence of left leg above knee: Secondary | ICD-10-CM | POA: Diagnosis not present

## 2021-01-01 DIAGNOSIS — Z89612 Acquired absence of left leg above knee: Secondary | ICD-10-CM | POA: Diagnosis not present

## 2021-01-04 DIAGNOSIS — Z950 Presence of cardiac pacemaker: Secondary | ICD-10-CM | POA: Diagnosis not present

## 2021-01-05 DIAGNOSIS — Z950 Presence of cardiac pacemaker: Secondary | ICD-10-CM | POA: Diagnosis not present

## 2021-01-06 DIAGNOSIS — I739 Peripheral vascular disease, unspecified: Secondary | ICD-10-CM | POA: Diagnosis not present

## 2021-01-06 DIAGNOSIS — I509 Heart failure, unspecified: Secondary | ICD-10-CM | POA: Diagnosis not present

## 2021-01-06 DIAGNOSIS — G8928 Other chronic postprocedural pain: Secondary | ICD-10-CM | POA: Diagnosis not present

## 2021-01-06 DIAGNOSIS — E261 Secondary hyperaldosteronism: Secondary | ICD-10-CM | POA: Diagnosis not present

## 2021-01-06 DIAGNOSIS — F325 Major depressive disorder, single episode, in full remission: Secondary | ICD-10-CM | POA: Diagnosis not present

## 2021-01-06 DIAGNOSIS — Z89612 Acquired absence of left leg above knee: Secondary | ICD-10-CM | POA: Diagnosis not present

## 2021-01-09 DIAGNOSIS — M25552 Pain in left hip: Secondary | ICD-10-CM | POA: Diagnosis not present

## 2021-01-09 DIAGNOSIS — R262 Difficulty in walking, not elsewhere classified: Secondary | ICD-10-CM | POA: Diagnosis not present

## 2021-01-09 DIAGNOSIS — G546 Phantom limb syndrome with pain: Secondary | ICD-10-CM | POA: Diagnosis not present

## 2021-01-09 DIAGNOSIS — Z89612 Acquired absence of left leg above knee: Secondary | ICD-10-CM | POA: Diagnosis not present

## 2021-01-09 DIAGNOSIS — M25652 Stiffness of left hip, not elsewhere classified: Secondary | ICD-10-CM | POA: Diagnosis not present

## 2021-01-10 DIAGNOSIS — D500 Iron deficiency anemia secondary to blood loss (chronic): Secondary | ICD-10-CM | POA: Diagnosis not present

## 2021-01-10 DIAGNOSIS — I70229 Atherosclerosis of native arteries of extremities with rest pain, unspecified extremity: Secondary | ICD-10-CM | POA: Diagnosis not present

## 2021-01-10 DIAGNOSIS — I2510 Atherosclerotic heart disease of native coronary artery without angina pectoris: Secondary | ICD-10-CM | POA: Diagnosis not present

## 2021-01-10 DIAGNOSIS — M79605 Pain in left leg: Secondary | ICD-10-CM | POA: Diagnosis not present

## 2021-01-10 DIAGNOSIS — I10 Essential (primary) hypertension: Secondary | ICD-10-CM | POA: Diagnosis not present

## 2021-01-10 DIAGNOSIS — I517 Cardiomegaly: Secondary | ICD-10-CM | POA: Diagnosis not present

## 2021-01-10 DIAGNOSIS — Z79899 Other long term (current) drug therapy: Secondary | ICD-10-CM | POA: Diagnosis not present

## 2021-01-10 DIAGNOSIS — G8929 Other chronic pain: Secondary | ICD-10-CM | POA: Diagnosis not present

## 2021-01-10 DIAGNOSIS — Z0000 Encounter for general adult medical examination without abnormal findings: Secondary | ICD-10-CM | POA: Diagnosis not present

## 2021-01-10 DIAGNOSIS — E785 Hyperlipidemia, unspecified: Secondary | ICD-10-CM | POA: Diagnosis not present

## 2021-01-10 DIAGNOSIS — E118 Type 2 diabetes mellitus with unspecified complications: Secondary | ICD-10-CM | POA: Diagnosis not present

## 2021-01-10 DIAGNOSIS — Z1231 Encounter for screening mammogram for malignant neoplasm of breast: Secondary | ICD-10-CM | POA: Diagnosis not present

## 2021-01-11 ENCOUNTER — Other Ambulatory Visit: Payer: Self-pay | Admitting: Internal Medicine

## 2021-01-11 DIAGNOSIS — M25552 Pain in left hip: Secondary | ICD-10-CM | POA: Diagnosis not present

## 2021-01-11 DIAGNOSIS — G546 Phantom limb syndrome with pain: Secondary | ICD-10-CM | POA: Diagnosis not present

## 2021-01-11 DIAGNOSIS — Z1231 Encounter for screening mammogram for malignant neoplasm of breast: Secondary | ICD-10-CM

## 2021-01-11 DIAGNOSIS — M25652 Stiffness of left hip, not elsewhere classified: Secondary | ICD-10-CM | POA: Diagnosis not present

## 2021-01-11 DIAGNOSIS — Z89612 Acquired absence of left leg above knee: Secondary | ICD-10-CM | POA: Diagnosis not present

## 2021-01-11 DIAGNOSIS — R262 Difficulty in walking, not elsewhere classified: Secondary | ICD-10-CM | POA: Diagnosis not present

## 2021-01-13 DIAGNOSIS — G546 Phantom limb syndrome with pain: Secondary | ICD-10-CM | POA: Diagnosis not present

## 2021-01-13 DIAGNOSIS — R262 Difficulty in walking, not elsewhere classified: Secondary | ICD-10-CM | POA: Diagnosis not present

## 2021-01-13 DIAGNOSIS — M25552 Pain in left hip: Secondary | ICD-10-CM | POA: Diagnosis not present

## 2021-01-13 DIAGNOSIS — M25652 Stiffness of left hip, not elsewhere classified: Secondary | ICD-10-CM | POA: Diagnosis not present

## 2021-01-13 DIAGNOSIS — Z89612 Acquired absence of left leg above knee: Secondary | ICD-10-CM | POA: Diagnosis not present

## 2021-01-16 DIAGNOSIS — G546 Phantom limb syndrome with pain: Secondary | ICD-10-CM | POA: Diagnosis not present

## 2021-01-16 DIAGNOSIS — M25552 Pain in left hip: Secondary | ICD-10-CM | POA: Diagnosis not present

## 2021-01-16 DIAGNOSIS — R262 Difficulty in walking, not elsewhere classified: Secondary | ICD-10-CM | POA: Diagnosis not present

## 2021-01-16 DIAGNOSIS — Z89612 Acquired absence of left leg above knee: Secondary | ICD-10-CM | POA: Diagnosis not present

## 2021-01-16 DIAGNOSIS — M25652 Stiffness of left hip, not elsewhere classified: Secondary | ICD-10-CM | POA: Diagnosis not present

## 2021-01-18 DIAGNOSIS — G546 Phantom limb syndrome with pain: Secondary | ICD-10-CM | POA: Diagnosis not present

## 2021-01-18 DIAGNOSIS — R262 Difficulty in walking, not elsewhere classified: Secondary | ICD-10-CM | POA: Diagnosis not present

## 2021-01-18 DIAGNOSIS — M25552 Pain in left hip: Secondary | ICD-10-CM | POA: Diagnosis not present

## 2021-01-18 DIAGNOSIS — M25652 Stiffness of left hip, not elsewhere classified: Secondary | ICD-10-CM | POA: Diagnosis not present

## 2021-01-18 DIAGNOSIS — Z89612 Acquired absence of left leg above knee: Secondary | ICD-10-CM | POA: Diagnosis not present

## 2021-01-20 DIAGNOSIS — R262 Difficulty in walking, not elsewhere classified: Secondary | ICD-10-CM | POA: Diagnosis not present

## 2021-01-20 DIAGNOSIS — Z89612 Acquired absence of left leg above knee: Secondary | ICD-10-CM | POA: Diagnosis not present

## 2021-01-20 DIAGNOSIS — M25552 Pain in left hip: Secondary | ICD-10-CM | POA: Diagnosis not present

## 2021-01-20 DIAGNOSIS — M25652 Stiffness of left hip, not elsewhere classified: Secondary | ICD-10-CM | POA: Diagnosis not present

## 2021-01-20 DIAGNOSIS — G546 Phantom limb syndrome with pain: Secondary | ICD-10-CM | POA: Diagnosis not present

## 2021-01-23 DIAGNOSIS — G546 Phantom limb syndrome with pain: Secondary | ICD-10-CM | POA: Diagnosis not present

## 2021-01-23 DIAGNOSIS — M25552 Pain in left hip: Secondary | ICD-10-CM | POA: Diagnosis not present

## 2021-01-23 DIAGNOSIS — Z89612 Acquired absence of left leg above knee: Secondary | ICD-10-CM | POA: Diagnosis not present

## 2021-01-23 DIAGNOSIS — M25652 Stiffness of left hip, not elsewhere classified: Secondary | ICD-10-CM | POA: Diagnosis not present

## 2021-01-23 DIAGNOSIS — R262 Difficulty in walking, not elsewhere classified: Secondary | ICD-10-CM | POA: Diagnosis not present

## 2021-01-25 DIAGNOSIS — R262 Difficulty in walking, not elsewhere classified: Secondary | ICD-10-CM | POA: Diagnosis not present

## 2021-01-25 DIAGNOSIS — Z89612 Acquired absence of left leg above knee: Secondary | ICD-10-CM | POA: Diagnosis not present

## 2021-01-25 DIAGNOSIS — G546 Phantom limb syndrome with pain: Secondary | ICD-10-CM | POA: Diagnosis not present

## 2021-01-25 DIAGNOSIS — M25652 Stiffness of left hip, not elsewhere classified: Secondary | ICD-10-CM | POA: Diagnosis not present

## 2021-01-25 DIAGNOSIS — M25552 Pain in left hip: Secondary | ICD-10-CM | POA: Diagnosis not present

## 2021-01-31 DIAGNOSIS — I739 Peripheral vascular disease, unspecified: Secondary | ICD-10-CM | POA: Diagnosis not present

## 2021-01-31 DIAGNOSIS — Z89612 Acquired absence of left leg above knee: Secondary | ICD-10-CM | POA: Diagnosis not present

## 2021-01-31 DIAGNOSIS — M25552 Pain in left hip: Secondary | ICD-10-CM | POA: Diagnosis not present

## 2021-01-31 DIAGNOSIS — E118 Type 2 diabetes mellitus with unspecified complications: Secondary | ICD-10-CM | POA: Diagnosis not present

## 2021-01-31 DIAGNOSIS — G546 Phantom limb syndrome with pain: Secondary | ICD-10-CM | POA: Diagnosis not present

## 2021-01-31 DIAGNOSIS — M25652 Stiffness of left hip, not elsewhere classified: Secondary | ICD-10-CM | POA: Diagnosis not present

## 2021-01-31 DIAGNOSIS — R262 Difficulty in walking, not elsewhere classified: Secondary | ICD-10-CM | POA: Diagnosis not present

## 2021-02-01 DIAGNOSIS — Z89612 Acquired absence of left leg above knee: Secondary | ICD-10-CM | POA: Diagnosis not present

## 2021-02-03 DIAGNOSIS — G546 Phantom limb syndrome with pain: Secondary | ICD-10-CM | POA: Diagnosis not present

## 2021-02-03 DIAGNOSIS — R262 Difficulty in walking, not elsewhere classified: Secondary | ICD-10-CM | POA: Diagnosis not present

## 2021-02-03 DIAGNOSIS — M25552 Pain in left hip: Secondary | ICD-10-CM | POA: Diagnosis not present

## 2021-02-03 DIAGNOSIS — M25652 Stiffness of left hip, not elsewhere classified: Secondary | ICD-10-CM | POA: Diagnosis not present

## 2021-02-03 DIAGNOSIS — Z89612 Acquired absence of left leg above knee: Secondary | ICD-10-CM | POA: Diagnosis not present

## 2021-02-07 DIAGNOSIS — Z89612 Acquired absence of left leg above knee: Secondary | ICD-10-CM | POA: Diagnosis not present

## 2021-02-07 DIAGNOSIS — M25552 Pain in left hip: Secondary | ICD-10-CM | POA: Diagnosis not present

## 2021-02-07 DIAGNOSIS — R262 Difficulty in walking, not elsewhere classified: Secondary | ICD-10-CM | POA: Diagnosis not present

## 2021-02-07 DIAGNOSIS — M25652 Stiffness of left hip, not elsewhere classified: Secondary | ICD-10-CM | POA: Diagnosis not present

## 2021-02-07 DIAGNOSIS — G546 Phantom limb syndrome with pain: Secondary | ICD-10-CM | POA: Diagnosis not present

## 2021-02-09 DIAGNOSIS — G546 Phantom limb syndrome with pain: Secondary | ICD-10-CM | POA: Diagnosis not present

## 2021-02-09 DIAGNOSIS — R262 Difficulty in walking, not elsewhere classified: Secondary | ICD-10-CM | POA: Diagnosis not present

## 2021-02-09 DIAGNOSIS — M25652 Stiffness of left hip, not elsewhere classified: Secondary | ICD-10-CM | POA: Diagnosis not present

## 2021-02-09 DIAGNOSIS — Z89612 Acquired absence of left leg above knee: Secondary | ICD-10-CM | POA: Diagnosis not present

## 2021-02-09 DIAGNOSIS — M25552 Pain in left hip: Secondary | ICD-10-CM | POA: Diagnosis not present

## 2021-02-13 DIAGNOSIS — G546 Phantom limb syndrome with pain: Secondary | ICD-10-CM | POA: Diagnosis not present

## 2021-02-13 DIAGNOSIS — M25652 Stiffness of left hip, not elsewhere classified: Secondary | ICD-10-CM | POA: Diagnosis not present

## 2021-02-13 DIAGNOSIS — R262 Difficulty in walking, not elsewhere classified: Secondary | ICD-10-CM | POA: Diagnosis not present

## 2021-02-13 DIAGNOSIS — M25552 Pain in left hip: Secondary | ICD-10-CM | POA: Diagnosis not present

## 2021-02-13 DIAGNOSIS — Z89612 Acquired absence of left leg above knee: Secondary | ICD-10-CM | POA: Diagnosis not present

## 2021-02-21 DIAGNOSIS — G546 Phantom limb syndrome with pain: Secondary | ICD-10-CM | POA: Diagnosis not present

## 2021-02-21 DIAGNOSIS — M25652 Stiffness of left hip, not elsewhere classified: Secondary | ICD-10-CM | POA: Diagnosis not present

## 2021-02-21 DIAGNOSIS — Z89612 Acquired absence of left leg above knee: Secondary | ICD-10-CM | POA: Diagnosis not present

## 2021-02-21 DIAGNOSIS — M25552 Pain in left hip: Secondary | ICD-10-CM | POA: Diagnosis not present

## 2021-02-21 DIAGNOSIS — R262 Difficulty in walking, not elsewhere classified: Secondary | ICD-10-CM | POA: Diagnosis not present

## 2021-02-23 DIAGNOSIS — R262 Difficulty in walking, not elsewhere classified: Secondary | ICD-10-CM | POA: Diagnosis not present

## 2021-02-23 DIAGNOSIS — M25552 Pain in left hip: Secondary | ICD-10-CM | POA: Diagnosis not present

## 2021-02-23 DIAGNOSIS — M25652 Stiffness of left hip, not elsewhere classified: Secondary | ICD-10-CM | POA: Diagnosis not present

## 2021-02-23 DIAGNOSIS — G546 Phantom limb syndrome with pain: Secondary | ICD-10-CM | POA: Diagnosis not present

## 2021-02-23 DIAGNOSIS — Z89612 Acquired absence of left leg above knee: Secondary | ICD-10-CM | POA: Diagnosis not present

## 2021-02-27 DIAGNOSIS — Z89612 Acquired absence of left leg above knee: Secondary | ICD-10-CM | POA: Diagnosis not present

## 2021-02-27 DIAGNOSIS — G546 Phantom limb syndrome with pain: Secondary | ICD-10-CM | POA: Diagnosis not present

## 2021-02-27 DIAGNOSIS — M25552 Pain in left hip: Secondary | ICD-10-CM | POA: Diagnosis not present

## 2021-02-27 DIAGNOSIS — M25652 Stiffness of left hip, not elsewhere classified: Secondary | ICD-10-CM | POA: Diagnosis not present

## 2021-02-27 DIAGNOSIS — R262 Difficulty in walking, not elsewhere classified: Secondary | ICD-10-CM | POA: Diagnosis not present

## 2021-03-01 DIAGNOSIS — M25552 Pain in left hip: Secondary | ICD-10-CM | POA: Diagnosis not present

## 2021-03-01 DIAGNOSIS — M25652 Stiffness of left hip, not elsewhere classified: Secondary | ICD-10-CM | POA: Diagnosis not present

## 2021-03-01 DIAGNOSIS — R262 Difficulty in walking, not elsewhere classified: Secondary | ICD-10-CM | POA: Diagnosis not present

## 2021-03-01 DIAGNOSIS — G546 Phantom limb syndrome with pain: Secondary | ICD-10-CM | POA: Diagnosis not present

## 2021-03-01 DIAGNOSIS — Z89612 Acquired absence of left leg above knee: Secondary | ICD-10-CM | POA: Diagnosis not present

## 2021-03-03 DIAGNOSIS — Z89612 Acquired absence of left leg above knee: Secondary | ICD-10-CM | POA: Diagnosis not present

## 2021-03-07 DIAGNOSIS — M25552 Pain in left hip: Secondary | ICD-10-CM | POA: Diagnosis not present

## 2021-03-07 DIAGNOSIS — M25652 Stiffness of left hip, not elsewhere classified: Secondary | ICD-10-CM | POA: Diagnosis not present

## 2021-03-07 DIAGNOSIS — G546 Phantom limb syndrome with pain: Secondary | ICD-10-CM | POA: Diagnosis not present

## 2021-03-07 DIAGNOSIS — Z89612 Acquired absence of left leg above knee: Secondary | ICD-10-CM | POA: Diagnosis not present

## 2021-03-07 DIAGNOSIS — R262 Difficulty in walking, not elsewhere classified: Secondary | ICD-10-CM | POA: Diagnosis not present

## 2021-03-09 DIAGNOSIS — R262 Difficulty in walking, not elsewhere classified: Secondary | ICD-10-CM | POA: Diagnosis not present

## 2021-03-09 DIAGNOSIS — G546 Phantom limb syndrome with pain: Secondary | ICD-10-CM | POA: Diagnosis not present

## 2021-03-09 DIAGNOSIS — M25652 Stiffness of left hip, not elsewhere classified: Secondary | ICD-10-CM | POA: Diagnosis not present

## 2021-03-09 DIAGNOSIS — Z89612 Acquired absence of left leg above knee: Secondary | ICD-10-CM | POA: Diagnosis not present

## 2021-03-09 DIAGNOSIS — M25552 Pain in left hip: Secondary | ICD-10-CM | POA: Diagnosis not present

## 2021-03-14 DIAGNOSIS — M25552 Pain in left hip: Secondary | ICD-10-CM | POA: Diagnosis not present

## 2021-03-14 DIAGNOSIS — Z89612 Acquired absence of left leg above knee: Secondary | ICD-10-CM | POA: Diagnosis not present

## 2021-03-14 DIAGNOSIS — M25652 Stiffness of left hip, not elsewhere classified: Secondary | ICD-10-CM | POA: Diagnosis not present

## 2021-03-14 DIAGNOSIS — R262 Difficulty in walking, not elsewhere classified: Secondary | ICD-10-CM | POA: Diagnosis not present

## 2021-03-14 DIAGNOSIS — G546 Phantom limb syndrome with pain: Secondary | ICD-10-CM | POA: Diagnosis not present

## 2021-03-16 DIAGNOSIS — R262 Difficulty in walking, not elsewhere classified: Secondary | ICD-10-CM | POA: Diagnosis not present

## 2021-03-16 DIAGNOSIS — M25552 Pain in left hip: Secondary | ICD-10-CM | POA: Diagnosis not present

## 2021-03-16 DIAGNOSIS — Z89612 Acquired absence of left leg above knee: Secondary | ICD-10-CM | POA: Diagnosis not present

## 2021-03-16 DIAGNOSIS — M25652 Stiffness of left hip, not elsewhere classified: Secondary | ICD-10-CM | POA: Diagnosis not present

## 2021-03-16 DIAGNOSIS — G546 Phantom limb syndrome with pain: Secondary | ICD-10-CM | POA: Diagnosis not present

## 2021-03-17 DIAGNOSIS — Z4781 Encounter for orthopedic aftercare following surgical amputation: Secondary | ICD-10-CM | POA: Diagnosis not present

## 2021-03-17 DIAGNOSIS — Z89612 Acquired absence of left leg above knee: Secondary | ICD-10-CM | POA: Diagnosis not present

## 2021-03-21 DIAGNOSIS — G546 Phantom limb syndrome with pain: Secondary | ICD-10-CM | POA: Diagnosis not present

## 2021-03-21 DIAGNOSIS — M25552 Pain in left hip: Secondary | ICD-10-CM | POA: Diagnosis not present

## 2021-03-21 DIAGNOSIS — R262 Difficulty in walking, not elsewhere classified: Secondary | ICD-10-CM | POA: Diagnosis not present

## 2021-03-21 DIAGNOSIS — Z89612 Acquired absence of left leg above knee: Secondary | ICD-10-CM | POA: Diagnosis not present

## 2021-03-21 DIAGNOSIS — M25652 Stiffness of left hip, not elsewhere classified: Secondary | ICD-10-CM | POA: Diagnosis not present

## 2021-03-23 DIAGNOSIS — M25552 Pain in left hip: Secondary | ICD-10-CM | POA: Diagnosis not present

## 2021-03-23 DIAGNOSIS — R262 Difficulty in walking, not elsewhere classified: Secondary | ICD-10-CM | POA: Diagnosis not present

## 2021-03-23 DIAGNOSIS — M25652 Stiffness of left hip, not elsewhere classified: Secondary | ICD-10-CM | POA: Diagnosis not present

## 2021-03-23 DIAGNOSIS — G546 Phantom limb syndrome with pain: Secondary | ICD-10-CM | POA: Diagnosis not present

## 2021-03-23 DIAGNOSIS — Z89612 Acquired absence of left leg above knee: Secondary | ICD-10-CM | POA: Diagnosis not present

## 2021-03-27 DIAGNOSIS — E119 Type 2 diabetes mellitus without complications: Secondary | ICD-10-CM | POA: Diagnosis not present

## 2021-03-28 DIAGNOSIS — M25652 Stiffness of left hip, not elsewhere classified: Secondary | ICD-10-CM | POA: Diagnosis not present

## 2021-03-28 DIAGNOSIS — G546 Phantom limb syndrome with pain: Secondary | ICD-10-CM | POA: Diagnosis not present

## 2021-03-28 DIAGNOSIS — M25552 Pain in left hip: Secondary | ICD-10-CM | POA: Diagnosis not present

## 2021-03-28 DIAGNOSIS — R262 Difficulty in walking, not elsewhere classified: Secondary | ICD-10-CM | POA: Diagnosis not present

## 2021-03-28 DIAGNOSIS — Z89612 Acquired absence of left leg above knee: Secondary | ICD-10-CM | POA: Diagnosis not present

## 2021-03-30 DIAGNOSIS — R262 Difficulty in walking, not elsewhere classified: Secondary | ICD-10-CM | POA: Diagnosis not present

## 2021-03-30 DIAGNOSIS — G546 Phantom limb syndrome with pain: Secondary | ICD-10-CM | POA: Diagnosis not present

## 2021-03-30 DIAGNOSIS — M25552 Pain in left hip: Secondary | ICD-10-CM | POA: Diagnosis not present

## 2021-03-30 DIAGNOSIS — Z89612 Acquired absence of left leg above knee: Secondary | ICD-10-CM | POA: Diagnosis not present

## 2021-03-30 DIAGNOSIS — M25652 Stiffness of left hip, not elsewhere classified: Secondary | ICD-10-CM | POA: Diagnosis not present

## 2021-04-03 DIAGNOSIS — Z89612 Acquired absence of left leg above knee: Secondary | ICD-10-CM | POA: Diagnosis not present

## 2021-04-04 DIAGNOSIS — G546 Phantom limb syndrome with pain: Secondary | ICD-10-CM | POA: Diagnosis not present

## 2021-04-04 DIAGNOSIS — Z89612 Acquired absence of left leg above knee: Secondary | ICD-10-CM | POA: Diagnosis not present

## 2021-04-04 DIAGNOSIS — R262 Difficulty in walking, not elsewhere classified: Secondary | ICD-10-CM | POA: Diagnosis not present

## 2021-04-04 DIAGNOSIS — M25652 Stiffness of left hip, not elsewhere classified: Secondary | ICD-10-CM | POA: Diagnosis not present

## 2021-04-04 DIAGNOSIS — M25552 Pain in left hip: Secondary | ICD-10-CM | POA: Diagnosis not present

## 2021-04-05 ENCOUNTER — Other Ambulatory Visit: Payer: Self-pay

## 2021-04-05 ENCOUNTER — Ambulatory Visit
Admission: RE | Admit: 2021-04-05 | Discharge: 2021-04-05 | Disposition: A | Discharge status: Home / Self Care | Payer: PPO | Source: Ambulatory Visit | Attending: Internal Medicine | Admitting: Internal Medicine

## 2021-04-05 DIAGNOSIS — Z1231 Encounter for screening mammogram for malignant neoplasm of breast: Secondary | ICD-10-CM | POA: Diagnosis not present

## 2021-04-06 DIAGNOSIS — R262 Difficulty in walking, not elsewhere classified: Secondary | ICD-10-CM | POA: Diagnosis not present

## 2021-04-06 DIAGNOSIS — Z89612 Acquired absence of left leg above knee: Secondary | ICD-10-CM | POA: Diagnosis not present

## 2021-04-06 DIAGNOSIS — G546 Phantom limb syndrome with pain: Secondary | ICD-10-CM | POA: Diagnosis not present

## 2021-04-06 DIAGNOSIS — M25552 Pain in left hip: Secondary | ICD-10-CM | POA: Diagnosis not present

## 2021-04-06 DIAGNOSIS — M25652 Stiffness of left hip, not elsewhere classified: Secondary | ICD-10-CM | POA: Diagnosis not present

## 2021-04-10 DIAGNOSIS — R001 Bradycardia, unspecified: Secondary | ICD-10-CM | POA: Diagnosis not present

## 2021-04-10 DIAGNOSIS — Z45018 Encounter for adjustment and management of other part of cardiac pacemaker: Secondary | ICD-10-CM | POA: Diagnosis not present

## 2021-04-10 DIAGNOSIS — Z1231 Encounter for screening mammogram for malignant neoplasm of breast: Secondary | ICD-10-CM | POA: Diagnosis not present

## 2021-04-10 DIAGNOSIS — Z950 Presence of cardiac pacemaker: Secondary | ICD-10-CM | POA: Diagnosis not present

## 2021-04-10 DIAGNOSIS — I498 Other specified cardiac arrhythmias: Secondary | ICD-10-CM | POA: Diagnosis not present

## 2021-04-11 DIAGNOSIS — R262 Difficulty in walking, not elsewhere classified: Secondary | ICD-10-CM | POA: Diagnosis not present

## 2021-04-11 DIAGNOSIS — M25652 Stiffness of left hip, not elsewhere classified: Secondary | ICD-10-CM | POA: Diagnosis not present

## 2021-04-11 DIAGNOSIS — Z89612 Acquired absence of left leg above knee: Secondary | ICD-10-CM | POA: Diagnosis not present

## 2021-04-11 DIAGNOSIS — M25552 Pain in left hip: Secondary | ICD-10-CM | POA: Diagnosis not present

## 2021-04-11 DIAGNOSIS — G546 Phantom limb syndrome with pain: Secondary | ICD-10-CM | POA: Diagnosis not present

## 2021-04-12 DIAGNOSIS — B351 Tinea unguium: Secondary | ICD-10-CM | POA: Diagnosis not present

## 2021-04-12 DIAGNOSIS — E1151 Type 2 diabetes mellitus with diabetic peripheral angiopathy without gangrene: Secondary | ICD-10-CM | POA: Diagnosis not present

## 2021-04-12 DIAGNOSIS — Z89612 Acquired absence of left leg above knee: Secondary | ICD-10-CM | POA: Diagnosis not present

## 2021-04-13 DIAGNOSIS — R262 Difficulty in walking, not elsewhere classified: Secondary | ICD-10-CM | POA: Diagnosis not present

## 2021-04-13 DIAGNOSIS — M25652 Stiffness of left hip, not elsewhere classified: Secondary | ICD-10-CM | POA: Diagnosis not present

## 2021-04-13 DIAGNOSIS — Z89612 Acquired absence of left leg above knee: Secondary | ICD-10-CM | POA: Diagnosis not present

## 2021-04-13 DIAGNOSIS — G546 Phantom limb syndrome with pain: Secondary | ICD-10-CM | POA: Diagnosis not present

## 2021-04-13 DIAGNOSIS — M25552 Pain in left hip: Secondary | ICD-10-CM | POA: Diagnosis not present

## 2021-04-19 DIAGNOSIS — R262 Difficulty in walking, not elsewhere classified: Secondary | ICD-10-CM | POA: Diagnosis not present

## 2021-04-19 DIAGNOSIS — M25552 Pain in left hip: Secondary | ICD-10-CM | POA: Diagnosis not present

## 2021-04-19 DIAGNOSIS — Z89612 Acquired absence of left leg above knee: Secondary | ICD-10-CM | POA: Diagnosis not present

## 2021-04-19 DIAGNOSIS — M25652 Stiffness of left hip, not elsewhere classified: Secondary | ICD-10-CM | POA: Diagnosis not present

## 2021-04-19 DIAGNOSIS — G546 Phantom limb syndrome with pain: Secondary | ICD-10-CM | POA: Diagnosis not present

## 2021-04-25 DIAGNOSIS — G546 Phantom limb syndrome with pain: Secondary | ICD-10-CM | POA: Diagnosis not present

## 2021-04-25 DIAGNOSIS — M25552 Pain in left hip: Secondary | ICD-10-CM | POA: Diagnosis not present

## 2021-04-25 DIAGNOSIS — R262 Difficulty in walking, not elsewhere classified: Secondary | ICD-10-CM | POA: Diagnosis not present

## 2021-04-25 DIAGNOSIS — M25652 Stiffness of left hip, not elsewhere classified: Secondary | ICD-10-CM | POA: Diagnosis not present

## 2021-04-25 DIAGNOSIS — Z89612 Acquired absence of left leg above knee: Secondary | ICD-10-CM | POA: Diagnosis not present

## 2021-04-27 DIAGNOSIS — G546 Phantom limb syndrome with pain: Secondary | ICD-10-CM | POA: Diagnosis not present

## 2021-04-27 DIAGNOSIS — Z89612 Acquired absence of left leg above knee: Secondary | ICD-10-CM | POA: Diagnosis not present

## 2021-04-27 DIAGNOSIS — R262 Difficulty in walking, not elsewhere classified: Secondary | ICD-10-CM | POA: Diagnosis not present

## 2021-04-27 DIAGNOSIS — M25652 Stiffness of left hip, not elsewhere classified: Secondary | ICD-10-CM | POA: Diagnosis not present

## 2021-04-27 DIAGNOSIS — M25552 Pain in left hip: Secondary | ICD-10-CM | POA: Diagnosis not present

## 2021-05-02 DIAGNOSIS — Z89612 Acquired absence of left leg above knee: Secondary | ICD-10-CM | POA: Diagnosis not present

## 2021-05-02 DIAGNOSIS — R262 Difficulty in walking, not elsewhere classified: Secondary | ICD-10-CM | POA: Diagnosis not present

## 2021-05-02 DIAGNOSIS — G546 Phantom limb syndrome with pain: Secondary | ICD-10-CM | POA: Diagnosis not present

## 2021-05-02 DIAGNOSIS — M25652 Stiffness of left hip, not elsewhere classified: Secondary | ICD-10-CM | POA: Diagnosis not present

## 2021-05-02 DIAGNOSIS — M25552 Pain in left hip: Secondary | ICD-10-CM | POA: Diagnosis not present

## 2021-05-03 DIAGNOSIS — Z89612 Acquired absence of left leg above knee: Secondary | ICD-10-CM | POA: Diagnosis not present

## 2021-05-04 DIAGNOSIS — R262 Difficulty in walking, not elsewhere classified: Secondary | ICD-10-CM | POA: Diagnosis not present

## 2021-05-04 DIAGNOSIS — G546 Phantom limb syndrome with pain: Secondary | ICD-10-CM | POA: Diagnosis not present

## 2021-05-04 DIAGNOSIS — M25552 Pain in left hip: Secondary | ICD-10-CM | POA: Diagnosis not present

## 2021-05-04 DIAGNOSIS — M25652 Stiffness of left hip, not elsewhere classified: Secondary | ICD-10-CM | POA: Diagnosis not present

## 2021-05-04 DIAGNOSIS — Z89612 Acquired absence of left leg above knee: Secondary | ICD-10-CM | POA: Diagnosis not present

## 2021-05-09 DIAGNOSIS — M25552 Pain in left hip: Secondary | ICD-10-CM | POA: Diagnosis not present

## 2021-05-09 DIAGNOSIS — R262 Difficulty in walking, not elsewhere classified: Secondary | ICD-10-CM | POA: Diagnosis not present

## 2021-05-09 DIAGNOSIS — G546 Phantom limb syndrome with pain: Secondary | ICD-10-CM | POA: Diagnosis not present

## 2021-05-09 DIAGNOSIS — M25652 Stiffness of left hip, not elsewhere classified: Secondary | ICD-10-CM | POA: Diagnosis not present

## 2021-05-09 DIAGNOSIS — Z89612 Acquired absence of left leg above knee: Secondary | ICD-10-CM | POA: Diagnosis not present

## 2021-05-11 DIAGNOSIS — Z89612 Acquired absence of left leg above knee: Secondary | ICD-10-CM | POA: Diagnosis not present

## 2021-05-11 DIAGNOSIS — M25652 Stiffness of left hip, not elsewhere classified: Secondary | ICD-10-CM | POA: Diagnosis not present

## 2021-05-11 DIAGNOSIS — M25552 Pain in left hip: Secondary | ICD-10-CM | POA: Diagnosis not present

## 2021-05-11 DIAGNOSIS — G546 Phantom limb syndrome with pain: Secondary | ICD-10-CM | POA: Diagnosis not present

## 2021-05-11 DIAGNOSIS — R262 Difficulty in walking, not elsewhere classified: Secondary | ICD-10-CM | POA: Diagnosis not present

## 2021-05-18 DIAGNOSIS — Z89612 Acquired absence of left leg above knee: Secondary | ICD-10-CM | POA: Diagnosis not present

## 2021-05-18 DIAGNOSIS — G546 Phantom limb syndrome with pain: Secondary | ICD-10-CM | POA: Diagnosis not present

## 2021-05-18 DIAGNOSIS — R262 Difficulty in walking, not elsewhere classified: Secondary | ICD-10-CM | POA: Diagnosis not present

## 2021-05-18 DIAGNOSIS — M25652 Stiffness of left hip, not elsewhere classified: Secondary | ICD-10-CM | POA: Diagnosis not present

## 2021-05-18 DIAGNOSIS — M25552 Pain in left hip: Secondary | ICD-10-CM | POA: Diagnosis not present

## 2021-05-23 DIAGNOSIS — M25652 Stiffness of left hip, not elsewhere classified: Secondary | ICD-10-CM | POA: Diagnosis not present

## 2021-05-23 DIAGNOSIS — I6523 Occlusion and stenosis of bilateral carotid arteries: Secondary | ICD-10-CM | POA: Diagnosis not present

## 2021-05-23 DIAGNOSIS — I050 Rheumatic mitral stenosis: Secondary | ICD-10-CM | POA: Diagnosis not present

## 2021-05-23 DIAGNOSIS — I739 Peripheral vascular disease, unspecified: Secondary | ICD-10-CM | POA: Diagnosis not present

## 2021-05-23 DIAGNOSIS — Z89612 Acquired absence of left leg above knee: Secondary | ICD-10-CM | POA: Diagnosis not present

## 2021-05-23 DIAGNOSIS — G546 Phantom limb syndrome with pain: Secondary | ICD-10-CM | POA: Diagnosis not present

## 2021-05-23 DIAGNOSIS — R262 Difficulty in walking, not elsewhere classified: Secondary | ICD-10-CM | POA: Diagnosis not present

## 2021-05-23 DIAGNOSIS — I2510 Atherosclerotic heart disease of native coronary artery without angina pectoris: Secondary | ICD-10-CM | POA: Diagnosis not present

## 2021-05-23 DIAGNOSIS — E785 Hyperlipidemia, unspecified: Secondary | ICD-10-CM | POA: Diagnosis not present

## 2021-05-23 DIAGNOSIS — I10 Essential (primary) hypertension: Secondary | ICD-10-CM | POA: Diagnosis not present

## 2021-05-23 DIAGNOSIS — M25552 Pain in left hip: Secondary | ICD-10-CM | POA: Diagnosis not present

## 2021-05-25 DIAGNOSIS — M25552 Pain in left hip: Secondary | ICD-10-CM | POA: Diagnosis not present

## 2021-05-25 DIAGNOSIS — Z89612 Acquired absence of left leg above knee: Secondary | ICD-10-CM | POA: Diagnosis not present

## 2021-05-25 DIAGNOSIS — E118 Type 2 diabetes mellitus with unspecified complications: Secondary | ICD-10-CM | POA: Diagnosis not present

## 2021-05-25 DIAGNOSIS — I10 Essential (primary) hypertension: Secondary | ICD-10-CM | POA: Diagnosis not present

## 2021-05-25 DIAGNOSIS — M25652 Stiffness of left hip, not elsewhere classified: Secondary | ICD-10-CM | POA: Diagnosis not present

## 2021-05-25 DIAGNOSIS — Z79899 Other long term (current) drug therapy: Secondary | ICD-10-CM | POA: Diagnosis not present

## 2021-05-25 DIAGNOSIS — E119 Type 2 diabetes mellitus without complications: Secondary | ICD-10-CM | POA: Diagnosis not present

## 2021-05-25 DIAGNOSIS — E785 Hyperlipidemia, unspecified: Secondary | ICD-10-CM | POA: Diagnosis not present

## 2021-05-25 DIAGNOSIS — I2510 Atherosclerotic heart disease of native coronary artery without angina pectoris: Secondary | ICD-10-CM | POA: Diagnosis not present

## 2021-05-25 DIAGNOSIS — D126 Benign neoplasm of colon, unspecified: Secondary | ICD-10-CM | POA: Diagnosis not present

## 2021-05-25 DIAGNOSIS — Z0000 Encounter for general adult medical examination without abnormal findings: Secondary | ICD-10-CM | POA: Diagnosis not present

## 2021-05-25 DIAGNOSIS — R262 Difficulty in walking, not elsewhere classified: Secondary | ICD-10-CM | POA: Diagnosis not present

## 2021-05-25 DIAGNOSIS — E669 Obesity, unspecified: Secondary | ICD-10-CM | POA: Diagnosis not present

## 2021-05-25 DIAGNOSIS — I498 Other specified cardiac arrhythmias: Secondary | ICD-10-CM | POA: Diagnosis not present

## 2021-05-25 DIAGNOSIS — D508 Other iron deficiency anemias: Secondary | ICD-10-CM | POA: Diagnosis not present

## 2021-05-25 DIAGNOSIS — G546 Phantom limb syndrome with pain: Secondary | ICD-10-CM | POA: Diagnosis not present

## 2021-05-25 DIAGNOSIS — M79605 Pain in left leg: Secondary | ICD-10-CM | POA: Diagnosis not present

## 2021-05-30 DIAGNOSIS — Z89612 Acquired absence of left leg above knee: Secondary | ICD-10-CM | POA: Diagnosis not present

## 2021-05-30 DIAGNOSIS — M25552 Pain in left hip: Secondary | ICD-10-CM | POA: Diagnosis not present

## 2021-05-30 DIAGNOSIS — R262 Difficulty in walking, not elsewhere classified: Secondary | ICD-10-CM | POA: Diagnosis not present

## 2021-05-30 DIAGNOSIS — G546 Phantom limb syndrome with pain: Secondary | ICD-10-CM | POA: Diagnosis not present

## 2021-05-30 DIAGNOSIS — M25652 Stiffness of left hip, not elsewhere classified: Secondary | ICD-10-CM | POA: Diagnosis not present

## 2021-06-01 DIAGNOSIS — G546 Phantom limb syndrome with pain: Secondary | ICD-10-CM | POA: Diagnosis not present

## 2021-06-01 DIAGNOSIS — R262 Difficulty in walking, not elsewhere classified: Secondary | ICD-10-CM | POA: Diagnosis not present

## 2021-06-01 DIAGNOSIS — M25652 Stiffness of left hip, not elsewhere classified: Secondary | ICD-10-CM | POA: Diagnosis not present

## 2021-06-01 DIAGNOSIS — M25552 Pain in left hip: Secondary | ICD-10-CM | POA: Diagnosis not present

## 2021-06-01 DIAGNOSIS — Z89612 Acquired absence of left leg above knee: Secondary | ICD-10-CM | POA: Diagnosis not present

## 2021-06-03 DIAGNOSIS — Z89612 Acquired absence of left leg above knee: Secondary | ICD-10-CM | POA: Diagnosis not present

## 2021-06-06 DIAGNOSIS — R262 Difficulty in walking, not elsewhere classified: Secondary | ICD-10-CM | POA: Diagnosis not present

## 2021-06-06 DIAGNOSIS — M25652 Stiffness of left hip, not elsewhere classified: Secondary | ICD-10-CM | POA: Diagnosis not present

## 2021-06-06 DIAGNOSIS — Z89612 Acquired absence of left leg above knee: Secondary | ICD-10-CM | POA: Diagnosis not present

## 2021-06-06 DIAGNOSIS — M25552 Pain in left hip: Secondary | ICD-10-CM | POA: Diagnosis not present

## 2021-06-06 DIAGNOSIS — G546 Phantom limb syndrome with pain: Secondary | ICD-10-CM | POA: Diagnosis not present

## 2021-06-08 DIAGNOSIS — Z89612 Acquired absence of left leg above knee: Secondary | ICD-10-CM | POA: Diagnosis not present

## 2021-06-08 DIAGNOSIS — M25552 Pain in left hip: Secondary | ICD-10-CM | POA: Diagnosis not present

## 2021-06-08 DIAGNOSIS — R262 Difficulty in walking, not elsewhere classified: Secondary | ICD-10-CM | POA: Diagnosis not present

## 2021-06-08 DIAGNOSIS — G546 Phantom limb syndrome with pain: Secondary | ICD-10-CM | POA: Diagnosis not present

## 2021-06-08 DIAGNOSIS — M25652 Stiffness of left hip, not elsewhere classified: Secondary | ICD-10-CM | POA: Diagnosis not present

## 2021-06-13 DIAGNOSIS — Z89612 Acquired absence of left leg above knee: Secondary | ICD-10-CM | POA: Diagnosis not present

## 2021-06-13 DIAGNOSIS — R262 Difficulty in walking, not elsewhere classified: Secondary | ICD-10-CM | POA: Diagnosis not present

## 2021-06-13 DIAGNOSIS — M25552 Pain in left hip: Secondary | ICD-10-CM | POA: Diagnosis not present

## 2021-06-13 DIAGNOSIS — M25652 Stiffness of left hip, not elsewhere classified: Secondary | ICD-10-CM | POA: Diagnosis not present

## 2021-06-13 DIAGNOSIS — G546 Phantom limb syndrome with pain: Secondary | ICD-10-CM | POA: Diagnosis not present

## 2021-06-15 DIAGNOSIS — Z89612 Acquired absence of left leg above knee: Secondary | ICD-10-CM | POA: Diagnosis not present

## 2021-06-15 DIAGNOSIS — G546 Phantom limb syndrome with pain: Secondary | ICD-10-CM | POA: Diagnosis not present

## 2021-06-15 DIAGNOSIS — M25652 Stiffness of left hip, not elsewhere classified: Secondary | ICD-10-CM | POA: Diagnosis not present

## 2021-06-15 DIAGNOSIS — R262 Difficulty in walking, not elsewhere classified: Secondary | ICD-10-CM | POA: Diagnosis not present

## 2021-06-15 DIAGNOSIS — M25552 Pain in left hip: Secondary | ICD-10-CM | POA: Diagnosis not present

## 2021-06-23 DIAGNOSIS — I771 Stricture of artery: Secondary | ICD-10-CM | POA: Insufficient documentation

## 2021-06-23 DIAGNOSIS — I050 Rheumatic mitral stenosis: Secondary | ICD-10-CM | POA: Insufficient documentation

## 2021-06-23 DIAGNOSIS — I6523 Occlusion and stenosis of bilateral carotid arteries: Secondary | ICD-10-CM | POA: Insufficient documentation

## 2021-06-27 DIAGNOSIS — G546 Phantom limb syndrome with pain: Secondary | ICD-10-CM | POA: Diagnosis not present

## 2021-06-27 DIAGNOSIS — Z89612 Acquired absence of left leg above knee: Secondary | ICD-10-CM | POA: Diagnosis not present

## 2021-06-27 DIAGNOSIS — R262 Difficulty in walking, not elsewhere classified: Secondary | ICD-10-CM | POA: Diagnosis not present

## 2021-06-27 DIAGNOSIS — M25552 Pain in left hip: Secondary | ICD-10-CM | POA: Diagnosis not present

## 2021-06-27 DIAGNOSIS — M25652 Stiffness of left hip, not elsewhere classified: Secondary | ICD-10-CM | POA: Diagnosis not present

## 2021-06-29 DIAGNOSIS — Z89612 Acquired absence of left leg above knee: Secondary | ICD-10-CM | POA: Diagnosis not present

## 2021-06-29 DIAGNOSIS — M25652 Stiffness of left hip, not elsewhere classified: Secondary | ICD-10-CM | POA: Diagnosis not present

## 2021-06-29 DIAGNOSIS — M25552 Pain in left hip: Secondary | ICD-10-CM | POA: Diagnosis not present

## 2021-06-29 DIAGNOSIS — R262 Difficulty in walking, not elsewhere classified: Secondary | ICD-10-CM | POA: Diagnosis not present

## 2021-06-29 DIAGNOSIS — G546 Phantom limb syndrome with pain: Secondary | ICD-10-CM | POA: Diagnosis not present

## 2021-06-30 DIAGNOSIS — Z89612 Acquired absence of left leg above knee: Secondary | ICD-10-CM | POA: Diagnosis not present

## 2021-06-30 DIAGNOSIS — Z4781 Encounter for orthopedic aftercare following surgical amputation: Secondary | ICD-10-CM | POA: Diagnosis not present

## 2021-07-04 DIAGNOSIS — G546 Phantom limb syndrome with pain: Secondary | ICD-10-CM | POA: Diagnosis not present

## 2021-07-04 DIAGNOSIS — M25652 Stiffness of left hip, not elsewhere classified: Secondary | ICD-10-CM | POA: Diagnosis not present

## 2021-07-04 DIAGNOSIS — Z89612 Acquired absence of left leg above knee: Secondary | ICD-10-CM | POA: Diagnosis not present

## 2021-07-04 DIAGNOSIS — M25552 Pain in left hip: Secondary | ICD-10-CM | POA: Diagnosis not present

## 2021-07-04 DIAGNOSIS — R262 Difficulty in walking, not elsewhere classified: Secondary | ICD-10-CM | POA: Diagnosis not present

## 2021-07-06 DIAGNOSIS — M25552 Pain in left hip: Secondary | ICD-10-CM | POA: Diagnosis not present

## 2021-07-06 DIAGNOSIS — M25652 Stiffness of left hip, not elsewhere classified: Secondary | ICD-10-CM | POA: Diagnosis not present

## 2021-07-06 DIAGNOSIS — R262 Difficulty in walking, not elsewhere classified: Secondary | ICD-10-CM | POA: Diagnosis not present

## 2021-07-06 DIAGNOSIS — G546 Phantom limb syndrome with pain: Secondary | ICD-10-CM | POA: Diagnosis not present

## 2021-07-06 DIAGNOSIS — Z89612 Acquired absence of left leg above knee: Secondary | ICD-10-CM | POA: Diagnosis not present

## 2021-07-11 DIAGNOSIS — I517 Cardiomegaly: Secondary | ICD-10-CM | POA: Diagnosis not present

## 2021-07-11 DIAGNOSIS — I342 Nonrheumatic mitral (valve) stenosis: Secondary | ICD-10-CM | POA: Diagnosis not present

## 2021-07-11 DIAGNOSIS — I10 Essential (primary) hypertension: Secondary | ICD-10-CM | POA: Diagnosis not present

## 2021-07-11 DIAGNOSIS — I050 Rheumatic mitral stenosis: Secondary | ICD-10-CM | POA: Diagnosis not present

## 2021-07-11 DIAGNOSIS — I6523 Occlusion and stenosis of bilateral carotid arteries: Secondary | ICD-10-CM | POA: Diagnosis not present

## 2021-07-11 DIAGNOSIS — E785 Hyperlipidemia, unspecified: Secondary | ICD-10-CM | POA: Diagnosis not present

## 2021-07-11 DIAGNOSIS — E7800 Pure hypercholesterolemia, unspecified: Secondary | ICD-10-CM | POA: Diagnosis not present

## 2021-07-11 DIAGNOSIS — I2510 Atherosclerotic heart disease of native coronary artery without angina pectoris: Secondary | ICD-10-CM | POA: Diagnosis not present

## 2021-07-11 DIAGNOSIS — I340 Nonrheumatic mitral (valve) insufficiency: Secondary | ICD-10-CM | POA: Diagnosis not present

## 2021-07-11 DIAGNOSIS — I771 Stricture of artery: Secondary | ICD-10-CM | POA: Diagnosis not present

## 2021-07-13 DIAGNOSIS — M25652 Stiffness of left hip, not elsewhere classified: Secondary | ICD-10-CM | POA: Diagnosis not present

## 2021-07-13 DIAGNOSIS — R262 Difficulty in walking, not elsewhere classified: Secondary | ICD-10-CM | POA: Diagnosis not present

## 2021-07-13 DIAGNOSIS — M25552 Pain in left hip: Secondary | ICD-10-CM | POA: Diagnosis not present

## 2021-07-13 DIAGNOSIS — G546 Phantom limb syndrome with pain: Secondary | ICD-10-CM | POA: Diagnosis not present

## 2021-07-13 DIAGNOSIS — Z89612 Acquired absence of left leg above knee: Secondary | ICD-10-CM | POA: Diagnosis not present

## 2021-07-18 DIAGNOSIS — Z89612 Acquired absence of left leg above knee: Secondary | ICD-10-CM | POA: Diagnosis not present

## 2021-07-18 DIAGNOSIS — M25552 Pain in left hip: Secondary | ICD-10-CM | POA: Diagnosis not present

## 2021-07-18 DIAGNOSIS — R262 Difficulty in walking, not elsewhere classified: Secondary | ICD-10-CM | POA: Diagnosis not present

## 2021-07-18 DIAGNOSIS — M25652 Stiffness of left hip, not elsewhere classified: Secondary | ICD-10-CM | POA: Diagnosis not present

## 2021-07-18 DIAGNOSIS — G546 Phantom limb syndrome with pain: Secondary | ICD-10-CM | POA: Diagnosis not present

## 2021-07-20 DIAGNOSIS — G546 Phantom limb syndrome with pain: Secondary | ICD-10-CM | POA: Diagnosis not present

## 2021-07-20 DIAGNOSIS — R262 Difficulty in walking, not elsewhere classified: Secondary | ICD-10-CM | POA: Diagnosis not present

## 2021-07-20 DIAGNOSIS — M25652 Stiffness of left hip, not elsewhere classified: Secondary | ICD-10-CM | POA: Diagnosis not present

## 2021-07-20 DIAGNOSIS — M25552 Pain in left hip: Secondary | ICD-10-CM | POA: Diagnosis not present

## 2021-07-20 DIAGNOSIS — Z89612 Acquired absence of left leg above knee: Secondary | ICD-10-CM | POA: Diagnosis not present

## 2021-07-25 DIAGNOSIS — R262 Difficulty in walking, not elsewhere classified: Secondary | ICD-10-CM | POA: Diagnosis not present

## 2021-07-25 DIAGNOSIS — M25652 Stiffness of left hip, not elsewhere classified: Secondary | ICD-10-CM | POA: Diagnosis not present

## 2021-07-25 DIAGNOSIS — Z89612 Acquired absence of left leg above knee: Secondary | ICD-10-CM | POA: Diagnosis not present

## 2021-07-25 DIAGNOSIS — M25552 Pain in left hip: Secondary | ICD-10-CM | POA: Diagnosis not present

## 2021-07-25 DIAGNOSIS — G546 Phantom limb syndrome with pain: Secondary | ICD-10-CM | POA: Diagnosis not present

## 2021-07-27 DIAGNOSIS — Z89612 Acquired absence of left leg above knee: Secondary | ICD-10-CM | POA: Diagnosis not present

## 2021-07-31 DIAGNOSIS — M25652 Stiffness of left hip, not elsewhere classified: Secondary | ICD-10-CM | POA: Diagnosis not present

## 2021-07-31 DIAGNOSIS — G546 Phantom limb syndrome with pain: Secondary | ICD-10-CM | POA: Diagnosis not present

## 2021-07-31 DIAGNOSIS — R262 Difficulty in walking, not elsewhere classified: Secondary | ICD-10-CM | POA: Diagnosis not present

## 2021-07-31 DIAGNOSIS — Z89612 Acquired absence of left leg above knee: Secondary | ICD-10-CM | POA: Diagnosis not present

## 2021-07-31 DIAGNOSIS — M25552 Pain in left hip: Secondary | ICD-10-CM | POA: Diagnosis not present

## 2021-08-03 DIAGNOSIS — Z89612 Acquired absence of left leg above knee: Secondary | ICD-10-CM | POA: Diagnosis not present

## 2021-08-03 DIAGNOSIS — M25652 Stiffness of left hip, not elsewhere classified: Secondary | ICD-10-CM | POA: Diagnosis not present

## 2021-08-03 DIAGNOSIS — M25552 Pain in left hip: Secondary | ICD-10-CM | POA: Diagnosis not present

## 2021-08-03 DIAGNOSIS — R262 Difficulty in walking, not elsewhere classified: Secondary | ICD-10-CM | POA: Diagnosis not present

## 2021-08-03 DIAGNOSIS — G546 Phantom limb syndrome with pain: Secondary | ICD-10-CM | POA: Diagnosis not present

## 2021-08-08 DIAGNOSIS — M25552 Pain in left hip: Secondary | ICD-10-CM | POA: Diagnosis not present

## 2021-08-08 DIAGNOSIS — R262 Difficulty in walking, not elsewhere classified: Secondary | ICD-10-CM | POA: Diagnosis not present

## 2021-08-08 DIAGNOSIS — Z89612 Acquired absence of left leg above knee: Secondary | ICD-10-CM | POA: Diagnosis not present

## 2021-08-08 DIAGNOSIS — G546 Phantom limb syndrome with pain: Secondary | ICD-10-CM | POA: Diagnosis not present

## 2021-08-08 DIAGNOSIS — M25652 Stiffness of left hip, not elsewhere classified: Secondary | ICD-10-CM | POA: Diagnosis not present

## 2021-08-11 DIAGNOSIS — R262 Difficulty in walking, not elsewhere classified: Secondary | ICD-10-CM | POA: Diagnosis not present

## 2021-08-11 DIAGNOSIS — M25552 Pain in left hip: Secondary | ICD-10-CM | POA: Diagnosis not present

## 2021-08-11 DIAGNOSIS — M25652 Stiffness of left hip, not elsewhere classified: Secondary | ICD-10-CM | POA: Diagnosis not present

## 2021-08-11 DIAGNOSIS — Z89612 Acquired absence of left leg above knee: Secondary | ICD-10-CM | POA: Diagnosis not present

## 2021-08-11 DIAGNOSIS — G546 Phantom limb syndrome with pain: Secondary | ICD-10-CM | POA: Diagnosis not present

## 2021-08-15 DIAGNOSIS — R262 Difficulty in walking, not elsewhere classified: Secondary | ICD-10-CM | POA: Diagnosis not present

## 2021-08-15 DIAGNOSIS — G546 Phantom limb syndrome with pain: Secondary | ICD-10-CM | POA: Diagnosis not present

## 2021-08-15 DIAGNOSIS — M25552 Pain in left hip: Secondary | ICD-10-CM | POA: Diagnosis not present

## 2021-08-15 DIAGNOSIS — M25652 Stiffness of left hip, not elsewhere classified: Secondary | ICD-10-CM | POA: Diagnosis not present

## 2021-08-15 DIAGNOSIS — Z89612 Acquired absence of left leg above knee: Secondary | ICD-10-CM | POA: Diagnosis not present

## 2021-08-17 DIAGNOSIS — M25552 Pain in left hip: Secondary | ICD-10-CM | POA: Diagnosis not present

## 2021-08-17 DIAGNOSIS — Z89612 Acquired absence of left leg above knee: Secondary | ICD-10-CM | POA: Diagnosis not present

## 2021-08-17 DIAGNOSIS — G546 Phantom limb syndrome with pain: Secondary | ICD-10-CM | POA: Diagnosis not present

## 2021-08-17 DIAGNOSIS — M25652 Stiffness of left hip, not elsewhere classified: Secondary | ICD-10-CM | POA: Diagnosis not present

## 2021-08-17 DIAGNOSIS — R262 Difficulty in walking, not elsewhere classified: Secondary | ICD-10-CM | POA: Diagnosis not present

## 2021-09-01 DIAGNOSIS — L03115 Cellulitis of right lower limb: Secondary | ICD-10-CM | POA: Diagnosis not present

## 2021-09-01 DIAGNOSIS — S81811D Laceration without foreign body, right lower leg, subsequent encounter: Secondary | ICD-10-CM | POA: Diagnosis not present

## 2021-09-01 DIAGNOSIS — Z89612 Acquired absence of left leg above knee: Secondary | ICD-10-CM | POA: Diagnosis not present

## 2021-09-03 DIAGNOSIS — Z89612 Acquired absence of left leg above knee: Secondary | ICD-10-CM | POA: Diagnosis not present

## 2021-09-11 DIAGNOSIS — Z09 Encounter for follow-up examination after completed treatment for conditions other than malignant neoplasm: Secondary | ICD-10-CM | POA: Diagnosis not present

## 2021-09-11 DIAGNOSIS — Z872 Personal history of diseases of the skin and subcutaneous tissue: Secondary | ICD-10-CM | POA: Diagnosis not present

## 2021-09-21 DIAGNOSIS — Z89612 Acquired absence of left leg above knee: Secondary | ICD-10-CM | POA: Diagnosis not present

## 2021-09-21 DIAGNOSIS — M25552 Pain in left hip: Secondary | ICD-10-CM | POA: Diagnosis not present

## 2021-09-21 DIAGNOSIS — R262 Difficulty in walking, not elsewhere classified: Secondary | ICD-10-CM | POA: Diagnosis not present

## 2021-09-21 DIAGNOSIS — G546 Phantom limb syndrome with pain: Secondary | ICD-10-CM | POA: Diagnosis not present

## 2021-09-21 DIAGNOSIS — M25652 Stiffness of left hip, not elsewhere classified: Secondary | ICD-10-CM | POA: Diagnosis not present

## 2021-09-25 DIAGNOSIS — Z86010 Personal history of colonic polyps: Secondary | ICD-10-CM | POA: Diagnosis not present

## 2021-09-29 DIAGNOSIS — E1142 Type 2 diabetes mellitus with diabetic polyneuropathy: Secondary | ICD-10-CM | POA: Diagnosis not present

## 2021-09-29 DIAGNOSIS — B3731 Acute candidiasis of vulva and vagina: Secondary | ICD-10-CM | POA: Diagnosis not present

## 2021-09-29 DIAGNOSIS — Z89612 Acquired absence of left leg above knee: Secondary | ICD-10-CM | POA: Diagnosis not present

## 2021-09-29 DIAGNOSIS — I10 Essential (primary) hypertension: Secondary | ICD-10-CM | POA: Diagnosis not present

## 2021-09-29 DIAGNOSIS — E1151 Type 2 diabetes mellitus with diabetic peripheral angiopathy without gangrene: Secondary | ICD-10-CM | POA: Diagnosis not present

## 2021-09-29 DIAGNOSIS — Z9714 Presence of artificial left leg (complete) (partial): Secondary | ICD-10-CM | POA: Diagnosis not present

## 2021-10-03 DIAGNOSIS — Z89612 Acquired absence of left leg above knee: Secondary | ICD-10-CM | POA: Diagnosis not present

## 2021-10-26 DIAGNOSIS — I498 Other specified cardiac arrhythmias: Secondary | ICD-10-CM | POA: Diagnosis not present

## 2021-10-26 DIAGNOSIS — Z950 Presence of cardiac pacemaker: Secondary | ICD-10-CM | POA: Diagnosis not present

## 2021-10-26 DIAGNOSIS — I441 Atrioventricular block, second degree: Secondary | ICD-10-CM | POA: Diagnosis not present

## 2021-10-26 DIAGNOSIS — Z45018 Encounter for adjustment and management of other part of cardiac pacemaker: Secondary | ICD-10-CM | POA: Diagnosis not present

## 2021-10-26 DIAGNOSIS — R001 Bradycardia, unspecified: Secondary | ICD-10-CM | POA: Diagnosis not present

## 2021-11-21 DIAGNOSIS — Z1231 Encounter for screening mammogram for malignant neoplasm of breast: Secondary | ICD-10-CM | POA: Diagnosis not present

## 2021-11-21 DIAGNOSIS — I739 Peripheral vascular disease, unspecified: Secondary | ICD-10-CM | POA: Diagnosis not present

## 2021-11-21 DIAGNOSIS — E118 Type 2 diabetes mellitus with unspecified complications: Secondary | ICD-10-CM | POA: Diagnosis not present

## 2021-11-21 DIAGNOSIS — I2510 Atherosclerotic heart disease of native coronary artery without angina pectoris: Secondary | ICD-10-CM | POA: Diagnosis not present

## 2021-11-21 DIAGNOSIS — I10 Essential (primary) hypertension: Secondary | ICD-10-CM | POA: Diagnosis not present

## 2021-11-21 DIAGNOSIS — Z89612 Acquired absence of left leg above knee: Secondary | ICD-10-CM | POA: Diagnosis not present

## 2021-11-21 DIAGNOSIS — E669 Obesity, unspecified: Secondary | ICD-10-CM | POA: Diagnosis not present

## 2021-11-21 DIAGNOSIS — E785 Hyperlipidemia, unspecified: Secondary | ICD-10-CM | POA: Diagnosis not present

## 2021-11-21 DIAGNOSIS — Z79899 Other long term (current) drug therapy: Secondary | ICD-10-CM | POA: Diagnosis not present

## 2021-11-21 DIAGNOSIS — Z0000 Encounter for general adult medical examination without abnormal findings: Secondary | ICD-10-CM | POA: Diagnosis not present

## 2021-12-14 ENCOUNTER — Encounter: Payer: Self-pay | Admitting: Cardiology

## 2021-12-15 ENCOUNTER — Ambulatory Visit: Payer: PPO | Admitting: Anesthesiology

## 2021-12-15 ENCOUNTER — Encounter
Admission: RE | Disposition: A | Discharge status: Home / Self Care | Payer: Self-pay | Source: Home / Self Care | Attending: Gastroenterology

## 2021-12-15 ENCOUNTER — Ambulatory Visit
Admission: RE | Admit: 2021-12-15 | Discharge: 2021-12-15 | Disposition: A | Discharge status: Home / Self Care | Payer: PPO | Attending: Gastroenterology | Admitting: Gastroenterology

## 2021-12-15 DIAGNOSIS — K635 Polyp of colon: Secondary | ICD-10-CM | POA: Diagnosis not present

## 2021-12-15 DIAGNOSIS — I2510 Atherosclerotic heart disease of native coronary artery without angina pectoris: Secondary | ICD-10-CM | POA: Insufficient documentation

## 2021-12-15 DIAGNOSIS — I110 Hypertensive heart disease with heart failure: Secondary | ICD-10-CM | POA: Insufficient documentation

## 2021-12-15 DIAGNOSIS — Z1211 Encounter for screening for malignant neoplasm of colon: Secondary | ICD-10-CM | POA: Diagnosis not present

## 2021-12-15 DIAGNOSIS — E669 Obesity, unspecified: Secondary | ICD-10-CM | POA: Diagnosis not present

## 2021-12-15 DIAGNOSIS — Z85828 Personal history of other malignant neoplasm of skin: Secondary | ICD-10-CM | POA: Insufficient documentation

## 2021-12-15 DIAGNOSIS — Z9049 Acquired absence of other specified parts of digestive tract: Secondary | ICD-10-CM | POA: Diagnosis not present

## 2021-12-15 DIAGNOSIS — D175 Benign lipomatous neoplasm of intra-abdominal organs: Secondary | ICD-10-CM | POA: Diagnosis not present

## 2021-12-15 DIAGNOSIS — E785 Hyperlipidemia, unspecified: Secondary | ICD-10-CM | POA: Insufficient documentation

## 2021-12-15 DIAGNOSIS — Z09 Encounter for follow-up examination after completed treatment for conditions other than malignant neoplasm: Secondary | ICD-10-CM | POA: Diagnosis present

## 2021-12-15 DIAGNOSIS — Z6832 Body mass index (BMI) 32.0-32.9, adult: Secondary | ICD-10-CM | POA: Insufficient documentation

## 2021-12-15 DIAGNOSIS — Z7902 Long term (current) use of antithrombotics/antiplatelets: Secondary | ICD-10-CM | POA: Insufficient documentation

## 2021-12-15 DIAGNOSIS — Z87891 Personal history of nicotine dependence: Secondary | ICD-10-CM | POA: Insufficient documentation

## 2021-12-15 DIAGNOSIS — K641 Second degree hemorrhoids: Secondary | ICD-10-CM | POA: Diagnosis not present

## 2021-12-15 DIAGNOSIS — K649 Unspecified hemorrhoids: Secondary | ICD-10-CM | POA: Diagnosis not present

## 2021-12-15 DIAGNOSIS — I509 Heart failure, unspecified: Secondary | ICD-10-CM | POA: Diagnosis not present

## 2021-12-15 DIAGNOSIS — D124 Benign neoplasm of descending colon: Secondary | ICD-10-CM | POA: Diagnosis not present

## 2021-12-15 DIAGNOSIS — Z7984 Long term (current) use of oral hypoglycemic drugs: Secondary | ICD-10-CM | POA: Diagnosis not present

## 2021-12-15 DIAGNOSIS — K5730 Diverticulosis of large intestine without perforation or abscess without bleeding: Secondary | ICD-10-CM | POA: Insufficient documentation

## 2021-12-15 DIAGNOSIS — E1151 Type 2 diabetes mellitus with diabetic peripheral angiopathy without gangrene: Secondary | ICD-10-CM | POA: Diagnosis not present

## 2021-12-15 DIAGNOSIS — Z955 Presence of coronary angioplasty implant and graft: Secondary | ICD-10-CM | POA: Insufficient documentation

## 2021-12-15 DIAGNOSIS — Z86010 Personal history of colonic polyps: Secondary | ICD-10-CM | POA: Diagnosis not present

## 2021-12-15 DIAGNOSIS — Z950 Presence of cardiac pacemaker: Secondary | ICD-10-CM | POA: Diagnosis not present

## 2021-12-15 HISTORY — PX: COLONOSCOPY WITH PROPOFOL: SHX5780

## 2021-12-15 HISTORY — DX: Cardiac murmur, unspecified: R01.1

## 2021-12-15 LAB — GLUCOSE, CAPILLARY: Glucose-Capillary: 145 mg/dL — ABNORMAL HIGH (ref 70–99)

## 2021-12-15 SURGERY — COLONOSCOPY WITH PROPOFOL
Anesthesia: General

## 2021-12-15 MED ORDER — PROPOFOL 10 MG/ML IV BOLUS
INTRAVENOUS | Status: AC
  Filled 2021-12-15: qty 120

## 2021-12-15 MED ORDER — LIDOCAINE HCL (CARDIAC) PF 100 MG/5ML IV SOSY
PREFILLED_SYRINGE | INTRAVENOUS | Status: DC | PRN
  Administered 2021-12-15: 80 mg via INTRAVENOUS

## 2021-12-15 MED ORDER — PROPOFOL 10 MG/ML IV BOLUS
INTRAVENOUS | Status: DC | PRN
  Administered 2021-12-15: 30 mg via INTRAVENOUS
  Administered 2021-12-15: 40 mg via INTRAVENOUS
  Administered 2021-12-15: 30 mg via INTRAVENOUS

## 2021-12-15 MED ORDER — PROPOFOL 500 MG/50ML IV EMUL
INTRAVENOUS | Status: AC
  Filled 2021-12-15: qty 100

## 2021-12-15 MED ORDER — SODIUM CHLORIDE 0.9 % IV SOLN
INTRAVENOUS | Status: DC
  Administered 2021-12-15: 20 mL/h via INTRAVENOUS

## 2021-12-15 MED ORDER — PROPOFOL 500 MG/50ML IV EMUL
INTRAVENOUS | Status: DC | PRN
  Administered 2021-12-15: 100 ug/kg/min via INTRAVENOUS

## 2021-12-15 MED ORDER — PHENYLEPHRINE 40 MCG/ML (10ML) SYRINGE FOR IV PUSH (FOR BLOOD PRESSURE SUPPORT)
PREFILLED_SYRINGE | INTRAVENOUS | Status: DC | PRN
  Administered 2021-12-15: 80 ug via INTRAVENOUS
  Administered 2021-12-15: 160 ug via INTRAVENOUS

## 2021-12-15 NOTE — H&P (Signed)
Outpatient short stay form Pre-procedure ?12/15/2021  ?Lesly Rubenstein, MD ? ?Primary Physician: Idelle Crouch, MD ? ?Reason for visit:  Surveillance ? ?History of present illness:   ? ?76 y/o lady with history of obesity, PVD s/p lower limb amputation, HLD, and personal history of polyps here for colonoscopy. Takes plavix with last dose 5 days ago. History of hysterectomy and cholecystectomy. No known family history of GI malignancies. ? ? ? ?Current Facility-Administered Medications:  ?  0.9 %  sodium chloride infusion, , Intravenous, Continuous, Jarek Longton, Hilton Cork, MD, Last Rate: 20 mL/hr at 12/15/21 0738, 20 mL/hr at 12/15/21 0738 ? ?Medications Prior to Admission  ?Medication Sig Dispense Refill Last Dose  ? acetaminophen (TYLENOL) 325 MG tablet Take 2 tablets (650 mg total) by mouth every 6 (six) hours as needed for mild pain or headache (fever >/= 101).   Past Week  ? albuterol (VENTOLIN HFA) 108 (90 Base) MCG/ACT inhaler Inhale 2 puffs into the lungs every 6 (six) hours as needed for wheezing or shortness of breath.   Past Week  ? amLODipine (NORVASC) 5 MG tablet Take 5 mg by mouth daily.   Past Week  ? ascorbic acid (VITAMIN C) 500 MG tablet Take 1 tablet (500 mg total) by mouth daily.   Past Week  ? aspirin EC 81 MG EC tablet Take 1 tablet (81 mg total) by mouth daily.   Past Week  ? atorvastatin (LIPITOR) 20 MG tablet Take 40 mg by mouth daily.   Past Week  ? Calcium Carbonate-Vitamin D (CALCIUM 600+D PO) Take 1 tablet by mouth 2 (two) times daily.   Past Week  ? cholecalciferol (VITAMIN D) 25 MCG (1000 UT) tablet Take 1 tablet (1,000 Units total) by mouth daily.   Past Week  ? clopidogrel (PLAVIX) 75 MG tablet Take 1 tablet (75 mg total) by mouth daily.   Past Week  ? diphenhydrAMINE HCl, Sleep, ([facility] SLEEP AID PO) Take 2 capsules by mouth at bedtime as needed (sleep).   Past Week  ? docusate sodium (COLACE) 100 MG capsule Take 100 mg by mouth daily.   Past Week  ? DULoxetine (CYMBALTA) 30 MG  capsule Take 1 capsule (30 mg total) by mouth daily.  3 Past Week  ? fenofibrate (TRICOR) 145 MG tablet Take 145 mg by mouth daily.     ? fish oil-omega-3 fatty acids 1000 MG capsule Take 1 g by mouth daily.   Past Week  ? metFORMIN (GLUCOPHAGE) 1000 MG tablet Take 1,000 mg by mouth 2 (two) times daily with a meal.    12/14/2021  ? metoprolol succinate (TOPROL-XL) 100 MG 24 hr tablet Take 1 tablet (100 mg total) by mouth daily. Take with or immediately following a meal.   Past Week  ? Multiple Vitamin (MULTIVITAMIN WITH MINERALS) TABS tablet Take 1 tablet by mouth daily.   Past Week  ? senna-docusate (SENOKOT-S) 8.6-50 MG tablet Take 1 tablet by mouth at bedtime as needed for mild constipation.   Past Week  ? spironolactone (ALDACTONE) 25 MG tablet Take 12.5 mg by mouth daily.   12/14/2021  ? TURMERIC PO Take 1 capsule by mouth daily.    Past Week  ? vitamin B-12 (CYANOCOBALAMIN) 1000 MCG tablet Take 1,000 mcg by mouth daily.   Past Week  ? vitamin E 400 UNIT capsule Take 400 Units by mouth daily.    Past Week  ? zinc sulfate 220 (50 Zn) MG capsule Take 1 capsule (220 mg total) by  mouth daily.   Past Week  ? feeding supplement, ENSURE ENLIVE, (ENSURE ENLIVE) LIQD Take 237 mLs by mouth 3 (three) times daily between meals. 237 mL 12   ? fenofibrate 160 MG tablet Take 1 tablet (160 mg total) by mouth daily.     ? furosemide (LASIX) 20 MG tablet Take 20 mg by mouth daily as needed for fluid.      ? gabapentin (NEURONTIN) 100 MG capsule Take 2 capsules (200 mg total) by mouth 3 (three) times daily. (Patient taking differently: Take 300 mg by mouth 3 (three) times daily.)     ? glimepiride (AMARYL) 4 MG tablet Take 4 mg by mouth daily with breakfast.      ? guaiFENesin-dextromethorphan (ROBITUSSIN DM) 100-10 MG/5ML syrup Take 10 mLs by mouth every 4 (four) hours as needed for cough. 118 mL 0   ? losartan-hydrochlorothiazide (HYZAAR) 100-25 MG tablet Take 1 tablet by mouth daily.      ? magnesium oxide (MAG-OX) 400 MG tablet  Take 400 mg by mouth daily.     ? ? ? ?Allergies  ?Allergen Reactions  ? Tape Other (See Comments)  ?  Hypoallergenic tape - blisters ?Blisters  ? ? ? ?Past Medical History:  ?Diagnosis Date  ? Anemia   ? CHF (congestive heart failure) ([place])   ? Chicken pox   ? Colon polyps   ? Complication of anesthesia   ? Coronary artery disease   ? Cystitis 1986  ? Diabetes mellitus without complication ([place]) 2595  ? Diverticulosis   ? Heart disease 2008  ? Heart murmur   ? Hyperlipidemia   ? Hypertension 2003  ? Pacemaker 08/12/2020  ? PAD (peripheral artery disease) ([place])   ? PONV (postoperative nausea and vomiting)   ? Rotator cuff tendinitis   ? Squamous cell skin cancer 2016  ? ? ?Review of systems:  Otherwise negative.  ? ? ?Physical Exam ? ?Gen: Alert, oriented. Appears stated age.  ?HEENT: PERRLA. ?Lungs: No respiratory distress ?CV: RRR ?Abd: soft, benign, no masses ?Ext: No edema ? ? ? ?Planned procedures: Proceed with colonoscopy. The patient understands the nature of the planned procedure, indications, risks, alternatives and potential complications including but not limited to bleeding, infection, perforation, damage to internal organs and possible oversedation/side effects from anesthesia. The patient agrees and gives consent to proceed.  ?Please refer to procedure notes for findings, recommendations and patient disposition/instructions.  ? ? ? ?Lesly Rubenstein, MD ?[facility] ? ? ? ?  ? ?

## 2021-12-15 NOTE — Op Note (Signed)
[facility] ?Gastroenterology ?Patient Name: Christina Blackburn ?Procedure Date: 12/15/2021 7:48 AM ?MRN: [id number] ?Account #: [id number] ?Date of Birth: Jun 24, 1946 ?Admit Type: Outpatient ?Age: 76 ?Room: [facility] ENDO ROOM 3 ?Gender: Female ?Note Status: Finalized ?Instrument Name: Colonscope [id number] ?Procedure:             Colonoscopy ?Indications:           Surveillance: Personal history of adenomatous polyps  ?                       on last colonoscopy 3 years ago ?Providers:             Andrey Farmer MD, MD ?Referring MD:          Leonie Douglas. Doy Hutching, MD (Referring MD) ?Medicines:             Monitored Anesthesia Care ?Complications:         No immediate complications. Estimated blood loss:  ?                       Minimal. ?Procedure:             Pre-Anesthesia Assessment: ?                       - Prior to the procedure, a History and Physical was  ?                       performed, and patient medications and allergies were  ?                       reviewed. The patient is competent. The risks and  ?                       benefits of the procedure and the sedation options and  ?                       risks were discussed with the patient. All questions  ?                       were answered and informed consent was obtained.  ?                       Patient identification and proposed procedure were  ?                       verified by the physician, the nurse, the  ?                       anesthesiologist, the anesthetist and the technician  ?                       in the endoscopy suite. Mental Status Examination:  ?                       alert and oriented. Airway Examination: normal  ?                       oropharyngeal airway and neck mobility. Respiratory  ?  Examination: clear to auscultation. CV Examination:  ?                       normal. Prophylactic Antibiotics: The patient does not  ?                       require prophylactic antibiotics. Prior  ?                        Anticoagulants: The patient has taken Plavix  ?                       (clopidogrel), last dose was 5 days prior to  ?                       procedure. ASA Grade Assessment: III - A patient with  ?                       severe systemic disease. After reviewing the risks and  ?                       benefits, the patient was deemed in satisfactory  ?                       condition to undergo the procedure. The anesthesia  ?                       plan was to use monitored anesthesia care (MAC).  ?                       Immediately prior to administration of medications,  ?                       the patient was re-assessed for adequacy to receive  ?                       sedatives. The heart rate, respiratory rate, oxygen  ?                       saturations, blood pressure, adequacy of pulmonary  ?                       ventilation, and response to care were monitored  ?                       throughout the procedure. The physical status of the  ?                       patient was re-assessed after the procedure. ?                       After obtaining informed consent, the colonoscope was  ?                       passed under direct vision. Throughout the procedure,  ?                       the patient's blood pressure, pulse, and oxygen  ?  saturations were monitored continuously. The  ?                       Colonoscope was introduced through the anus and  ?                       advanced to the the cecum, identified by appendiceal  ?                       orifice and ileocecal valve. The colonoscopy was  ?                       performed without difficulty. The patient tolerated  ?                       the procedure well. The quality of the bowel  ?                       preparation was good. ?Findings: ?     The perianal and digital rectal examinations were normal. ?     There was a small lipoma, 10 mm in diameter, in the proximal transverse  ?     colon. ?     Multiple small-mouthed  diverticula were found in the sigmoid colon,  ?     descending colon, splenic flexure, transverse colon and ascending colon. ?     A 3 mm polyp was found in the descending colon. The polyp was sessile.  ?     The polyp was removed with a cold snare. Resection and retrieval were  ?     complete. Estimated blood loss was minimal. ?     Internal hemorrhoids were found during retroflexion. The hemorrhoids  ?     were Grade II (internal hemorrhoids that prolapse but reduce  ?     spontaneously). ?     The exam was otherwise without abnormality on direct and retroflexion  ?     views. ?Impression:            - Small lipoma in the proximal transverse colon. ?                       - Diverticulosis in the sigmoid colon, in the  ?                       descending colon, at the splenic flexure, in the  ?                       transverse colon and in the ascending colon. ?                       - One 3 mm polyp in the descending colon, removed with  ?                       a cold snare. Resected and retrieved. ?                       - Internal hemorrhoids. ?                       - The examination was otherwise normal on direct and  ?  retroflexion views. ?Recommendation:        - Discharge patient to home. ?                       - Resume previous diet. ?                       - Resume Plavix (clopidogrel) at prior dose tomorrow. ?                       - Await pathology results. ?                       - Repeat colonoscopy is not recommended due to current  ?                       age (38 years or older) for surveillance. ?                       - Return to referring physician as previously  ?                       scheduled. ?Procedure Code(s):     --- Professional --- ?                       [phone], Colonoscopy, flexible; with removal of  ?                       tumor(s), polyp(s), or other lesion(s) by snare  ?                       technique ?Diagnosis Code(s):     --- Professional --- ?                        Z86.010, Personal history of colonic polyps ?                       D17.5, Benign lipomatous neoplasm of intra-abdominal  ?                       organs ?                       K64.1, Second degree hemorrhoids ?                       K63.5, Polyp of colon ?                       K57.30, Diverticulosis of large intestine without  ?                       perforation or abscess without bleeding ?CPT copyright 2019 [employer] Medical Association. All rights reserved. ?The codes documented in this report are preliminary and upon coder review may  ?be revised to meet current compliance requirements. ?Andrey Farmer MD, MD ?12/15/2021 8:14:07 AM ?Number of Addenda: 0 ?Note Initiated On: 12/15/2021 7:48 AM ?Scope Withdrawal Time: 0 hours 10 minutes 42 seconds  ?Total Procedure Duration: 0 hours 17 minutes 4 seconds  ?Estimated Blood Loss:  Estimated blood loss was minimal. ?     [place]  [facility] ?

## 2021-12-15 NOTE — Transfer of Care (Signed)
Immediate Anesthesia Transfer of Care Note ? ?Patient: Christina Blackburn ? ?Procedure(s) Performed: COLONOSCOPY WITH PROPOFOL ? ?Patient Location: PACU and Endoscopy Unit ? ?Anesthesia Type:General ? ?Level of Consciousness: awake, drowsy and patient cooperative ? ?Airway & Oxygen Therapy: Patient Spontanous Breathing ? ?Post-op Assessment: Report given to RN and Post -op Vital signs reviewed and stable ? ?Post vital signs: Reviewed and stable ? ?Last Vitals:  ?Vitals Value Taken Time  ?BP 88/52 12/15/21 0817  ?Temp    ?Pulse 86 12/15/21 0819  ?Resp 22 12/15/21 0819  ?SpO2 98 % 12/15/21 0819  ?Vitals shown include unvalidated device data. ? ?Last Pain:  ?Vitals:  ? 12/15/21 0813  ?TempSrc:   ?PainSc: 0-No pain  ?   ? ?  ? ?Complications: No notable events documented.  Dr Wynetta Emery notified for MAPs of 64 and 63 in postop.  Pt mentating and oriented x4. ?

## 2021-12-15 NOTE — Anesthesia Postprocedure Evaluation (Signed)
Anesthesia Post Note ? ?Patient: Christina Blackburn ? ?Procedure(s) Performed: COLONOSCOPY WITH PROPOFOL ? ?Patient location during evaluation: Endoscopy ?Anesthesia Type: General ?Level of consciousness: awake and alert ?Pain management: pain level controlled ?Vital Signs Assessment: post-procedure vital signs reviewed and stable ?Respiratory status: spontaneous breathing, nonlabored ventilation and respiratory function stable ?Cardiovascular status: blood pressure returned to baseline and stable ?Postop Assessment: no apparent nausea or vomiting ?Anesthetic complications: no ? ? ?No notable events documented. ? ? ?Last Vitals:  ?Vitals:  ? 12/15/21 0834 12/15/21 0843  ?BP: 130/70 (!) 159/74  ?Pulse:    ?Resp:    ?Temp:    ?SpO2:    ?  ?Last Pain:  ?Vitals:  ? 12/15/21 0843  ?TempSrc:   ?PainSc: 0-No pain  ? ? ?  ?  ?  ?  ?  ?  ? ?[place] Ouch ? ? ? ? ?

## 2021-12-15 NOTE — Interval H&P Note (Signed)
History and Physical Interval Note: ? ?12/15/2021 ?7:42 AM ? ?Christina Blackburn  has presented today for surgery, with the diagnosis of H/O ADENOMATOUS POLYPS OF COLON.  The various methods of treatment have been discussed with the patient and family. After consideration of risks, benefits and other options for treatment, the patient has consented to  Procedure(s) with comments: ?COLONOSCOPY WITH PROPOFOL (N/A) - DM, WHEELCHAIR as a surgical intervention.  The patient's history has been reviewed, patient examined, no change in status, stable for surgery.  I have reviewed the patient's chart and labs.  Questions were answered to the patient's satisfaction.   ? ? ?Hilton Cork Montanna Mcbain ? ?Ok to proceed with colonoscopy ?

## 2021-12-15 NOTE — Anesthesia Preprocedure Evaluation (Addendum)
Anesthesia Evaluation  ?Patient identified by MRN, date of birth, ID band ?Patient awake ? ? ? ?Reviewed: ?Allergy & Precautions, NPO status , Patient's Chart, lab work & pertinent test results ? ?History of Anesthesia Complications ?(+) PONV and history of anesthetic complications ? ?Airway ?Mallampati: III ? ?TM Distance: >3 FB ?Neck ROM: Full ? ? ? Dental ? ?(+) Upper Dentures, Poor Dentition, Missing,  ?  ?Pulmonary ?neg sleep apnea, neg COPD, former smoker,  ?  ?breath sounds clear to auscultation- rhonchi ?(-) wheezing ? ? ? ? ? Cardiovascular ?hypertension, + CAD, + Cardiac Stents (14 yrs ago), + Peripheral Vascular Disease and +CHF  ?+ dysrhythmias (high grade AV block) + pacemaker  ?Rhythm:Regular Rate:Normal ?- Systolic murmurs and - Diastolic murmurs ?NM stress test 09/30/18: ?1. ?Mild left ventricular function ?2. ?Normal wall motion ?3. ?No evidence for scar or ischemia ? ?EKG 1/23: ?This result has an attachment that is not available.  ?Atrial-sensed ventricular-paced rhythm  ?Without magnet, Dual chamber pacing, no pacing abnormalities  ?Predominance of paced beats precludes contour analysis  ?Marked QRS Prolongation  ?Abnormal ECG ? 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   ? ?  ?Neuro/Psych ?neg Seizures negative neurological ROS ? negative psych ROS  ? GI/Hepatic ?negative GI ROS, Neg liver ROS,   ?Endo/Other  ?diabetes, Type 2, Oral Hypoglycemic Agents ? Renal/GU ?negative Renal ROS  ? ?  ?Musculoskeletal ?negative musculoskeletal ROS ?(+)  ? Abdominal ?(+) + obese,   ?Peds ? Hematology ? ?(+) Blood dyscrasia, anemia ,   ?Anesthesia Other Findings ?Past Medical History: ?No date: Anemia ?No  date: CHF (congestive heart failure) ([place]) ?No date: Chicken pox ?No date: Colon polyps ?No date: Complication of anesthesia ?No date: Coronary artery disease ?1986: Cystitis ?2001: Diabetes mellitus without complication ([place]) ?No date: Diverticulosis ?2008: Heart disease ?No date: Hyperlipidemia ?2003: Hypertension ?No date: PAD (peripheral artery disease) ([place]) ?No date: PONV (postoperative nausea and vomiting) ?No date: Rotator cuff tendinitis ?2016: Squamous cell skin cancer ? ? Reproductive/Obstetrics ? ?  ? ? ? ? ? ? ? ? ? ? ? ? ? ?  ?  ? ? ? ? ? ? ? ?Anesthesia Physical ? ?Anesthesia Plan ? ?ASA: III ? ?Anesthesia Plan: General  ? ?Post-op Pain Management:   ? ?Induction: Intravenous ? ?PONV Risk Score and Plan: 3 and Propofol infusion and TIVA ? ?Airway Management Planned: Natural Airway and Nasal Cannula ? ?Additional Equipment:  ? ?Intra-op Plan:  ? ?Post-operative Plan:  ? ?Informed Consent: I have reviewed the patients History and Physical, chart, labs and discussed the procedure including the risks, benefits and alternatives for the proposed anesthesia with the patient or authorized representative who has indicated his/her understanding and acceptance.  ? ? ? ?Dental advisory given ? ?Plan Discussed with: CRNA and Anesthesiologist ? ?Anesthesia Plan Comments:   ? ? ? ? ? ?Anesthesia Quick Evaluation ? ?

## 2021-12-18 ENCOUNTER — Encounter: Payer: Self-pay | Admitting: Gastroenterology

## 2021-12-18 LAB — SURGICAL PATHOLOGY

## 2022-02-16 DIAGNOSIS — D692 Other nonthrombocytopenic purpura: Secondary | ICD-10-CM | POA: Diagnosis not present

## 2022-02-16 DIAGNOSIS — J449 Chronic obstructive pulmonary disease, unspecified: Secondary | ICD-10-CM | POA: Diagnosis not present

## 2022-02-16 DIAGNOSIS — Z89612 Acquired absence of left leg above knee: Secondary | ICD-10-CM | POA: Diagnosis not present

## 2022-02-16 DIAGNOSIS — I495 Sick sinus syndrome: Secondary | ICD-10-CM | POA: Diagnosis not present

## 2022-02-16 DIAGNOSIS — E1151 Type 2 diabetes mellitus with diabetic peripheral angiopathy without gangrene: Secondary | ICD-10-CM | POA: Diagnosis not present

## 2022-02-16 DIAGNOSIS — Z950 Presence of cardiac pacemaker: Secondary | ICD-10-CM | POA: Diagnosis not present

## 2022-02-16 DIAGNOSIS — Z87891 Personal history of nicotine dependence: Secondary | ICD-10-CM | POA: Diagnosis not present

## 2022-02-16 DIAGNOSIS — E1142 Type 2 diabetes mellitus with diabetic polyneuropathy: Secondary | ICD-10-CM | POA: Diagnosis not present

## 2022-02-16 DIAGNOSIS — Z9714 Presence of artificial left leg (complete) (partial): Secondary | ICD-10-CM | POA: Diagnosis not present

## 2022-02-16 DIAGNOSIS — L0390 Cellulitis, unspecified: Secondary | ICD-10-CM | POA: Diagnosis not present

## 2022-02-16 DIAGNOSIS — I10 Essential (primary) hypertension: Secondary | ICD-10-CM | POA: Diagnosis not present

## 2022-02-19 DIAGNOSIS — E118 Type 2 diabetes mellitus with unspecified complications: Secondary | ICD-10-CM | POA: Diagnosis not present

## 2022-03-02 DIAGNOSIS — L539 Erythematous condition, unspecified: Secondary | ICD-10-CM | POA: Diagnosis not present

## 2022-03-07 ENCOUNTER — Other Ambulatory Visit: Payer: Self-pay | Admitting: Internal Medicine

## 2022-03-07 DIAGNOSIS — Z1231 Encounter for screening mammogram for malignant neoplasm of breast: Secondary | ICD-10-CM

## 2022-03-14 DIAGNOSIS — Z872 Personal history of diseases of the skin and subcutaneous tissue: Secondary | ICD-10-CM | POA: Diagnosis not present

## 2022-03-14 DIAGNOSIS — Z09 Encounter for follow-up examination after completed treatment for conditions other than malignant neoplasm: Secondary | ICD-10-CM | POA: Diagnosis not present

## 2022-04-09 ENCOUNTER — Ambulatory Visit
Admission: RE | Admit: 2022-04-09 | Discharge: 2022-04-09 | Disposition: A | Discharge status: Home / Self Care | Payer: PPO | Source: Ambulatory Visit | Attending: Internal Medicine | Admitting: Internal Medicine

## 2022-04-09 DIAGNOSIS — Z1231 Encounter for screening mammogram for malignant neoplasm of breast: Secondary | ICD-10-CM | POA: Diagnosis not present

## 2022-04-13 DIAGNOSIS — Z89612 Acquired absence of left leg above knee: Secondary | ICD-10-CM | POA: Diagnosis not present

## 2022-04-13 DIAGNOSIS — B351 Tinea unguium: Secondary | ICD-10-CM | POA: Diagnosis not present

## 2022-04-13 DIAGNOSIS — M79671 Pain in right foot: Secondary | ICD-10-CM | POA: Diagnosis not present

## 2022-04-13 DIAGNOSIS — E1151 Type 2 diabetes mellitus with diabetic peripheral angiopathy without gangrene: Secondary | ICD-10-CM | POA: Diagnosis not present

## 2022-04-25 DIAGNOSIS — Z950 Presence of cardiac pacemaker: Secondary | ICD-10-CM | POA: Diagnosis not present

## 2022-05-08 DIAGNOSIS — R001 Bradycardia, unspecified: Secondary | ICD-10-CM | POA: Diagnosis not present

## 2022-05-08 DIAGNOSIS — I050 Rheumatic mitral stenosis: Secondary | ICD-10-CM | POA: Diagnosis not present

## 2022-05-08 DIAGNOSIS — I771 Stricture of artery: Secondary | ICD-10-CM | POA: Diagnosis not present

## 2022-05-08 DIAGNOSIS — I6523 Occlusion and stenosis of bilateral carotid arteries: Secondary | ICD-10-CM | POA: Diagnosis not present

## 2022-05-22 DIAGNOSIS — M79641 Pain in right hand: Secondary | ICD-10-CM | POA: Diagnosis not present

## 2022-05-22 DIAGNOSIS — M79642 Pain in left hand: Secondary | ICD-10-CM | POA: Diagnosis not present

## 2022-05-22 DIAGNOSIS — Z0000 Encounter for general adult medical examination without abnormal findings: Secondary | ICD-10-CM | POA: Diagnosis not present

## 2022-05-22 DIAGNOSIS — I10 Essential (primary) hypertension: Secondary | ICD-10-CM | POA: Diagnosis not present

## 2022-05-22 DIAGNOSIS — I2510 Atherosclerotic heart disease of native coronary artery without angina pectoris: Secondary | ICD-10-CM | POA: Diagnosis not present

## 2022-05-22 DIAGNOSIS — Z79899 Other long term (current) drug therapy: Secondary | ICD-10-CM | POA: Diagnosis not present

## 2022-05-22 DIAGNOSIS — E785 Hyperlipidemia, unspecified: Secondary | ICD-10-CM | POA: Diagnosis not present

## 2022-05-22 DIAGNOSIS — E669 Obesity, unspecified: Secondary | ICD-10-CM | POA: Diagnosis not present

## 2022-05-22 DIAGNOSIS — Z950 Presence of cardiac pacemaker: Secondary | ICD-10-CM | POA: Diagnosis not present

## 2022-05-22 DIAGNOSIS — E118 Type 2 diabetes mellitus with unspecified complications: Secondary | ICD-10-CM | POA: Diagnosis not present

## 2022-05-24 DIAGNOSIS — Z89612 Acquired absence of left leg above knee: Secondary | ICD-10-CM | POA: Diagnosis not present

## 2022-05-24 DIAGNOSIS — I495 Sick sinus syndrome: Secondary | ICD-10-CM | POA: Diagnosis not present

## 2022-05-24 DIAGNOSIS — E1159 Type 2 diabetes mellitus with other circulatory complications: Secondary | ICD-10-CM | POA: Diagnosis not present

## 2022-05-24 DIAGNOSIS — I10 Essential (primary) hypertension: Secondary | ICD-10-CM | POA: Diagnosis not present

## 2022-05-24 DIAGNOSIS — Z950 Presence of cardiac pacemaker: Secondary | ICD-10-CM | POA: Diagnosis not present

## 2022-05-24 DIAGNOSIS — D692 Other nonthrombocytopenic purpura: Secondary | ICD-10-CM | POA: Diagnosis not present

## 2022-05-24 DIAGNOSIS — E1140 Type 2 diabetes mellitus with diabetic neuropathy, unspecified: Secondary | ICD-10-CM | POA: Diagnosis not present

## 2022-05-28 DIAGNOSIS — I2510 Atherosclerotic heart disease of native coronary artery without angina pectoris: Secondary | ICD-10-CM | POA: Diagnosis not present

## 2022-05-28 DIAGNOSIS — I10 Essential (primary) hypertension: Secondary | ICD-10-CM | POA: Diagnosis not present

## 2022-05-28 DIAGNOSIS — I739 Peripheral vascular disease, unspecified: Secondary | ICD-10-CM | POA: Diagnosis not present

## 2022-05-28 DIAGNOSIS — I6523 Occlusion and stenosis of bilateral carotid arteries: Secondary | ICD-10-CM | POA: Diagnosis not present

## 2022-05-28 DIAGNOSIS — I771 Stricture of artery: Secondary | ICD-10-CM | POA: Diagnosis not present

## 2022-05-31 ENCOUNTER — Other Ambulatory Visit: Payer: Self-pay | Admitting: Internal Medicine

## 2022-06-06 DIAGNOSIS — M65321 Trigger finger, right index finger: Secondary | ICD-10-CM | POA: Diagnosis not present

## 2022-06-06 DIAGNOSIS — E669 Obesity, unspecified: Secondary | ICD-10-CM | POA: Diagnosis not present

## 2022-06-06 DIAGNOSIS — M65332 Trigger finger, left middle finger: Secondary | ICD-10-CM | POA: Diagnosis not present

## 2022-06-06 DIAGNOSIS — E118 Type 2 diabetes mellitus with unspecified complications: Secondary | ICD-10-CM | POA: Diagnosis not present

## 2022-06-20 DIAGNOSIS — Z872 Personal history of diseases of the skin and subcutaneous tissue: Secondary | ICD-10-CM | POA: Diagnosis not present

## 2022-06-20 DIAGNOSIS — L03115 Cellulitis of right lower limb: Secondary | ICD-10-CM | POA: Diagnosis not present

## 2022-06-20 DIAGNOSIS — Z89612 Acquired absence of left leg above knee: Secondary | ICD-10-CM | POA: Diagnosis not present

## 2022-06-26 DIAGNOSIS — Z872 Personal history of diseases of the skin and subcutaneous tissue: Secondary | ICD-10-CM | POA: Diagnosis not present

## 2022-06-26 DIAGNOSIS — Z09 Encounter for follow-up examination after completed treatment for conditions other than malignant neoplasm: Secondary | ICD-10-CM | POA: Diagnosis not present

## 2022-06-29 DIAGNOSIS — H40003 Preglaucoma, unspecified, bilateral: Secondary | ICD-10-CM | POA: Diagnosis not present

## 2022-07-04 DIAGNOSIS — L03115 Cellulitis of right lower limb: Secondary | ICD-10-CM | POA: Diagnosis not present

## 2022-07-04 DIAGNOSIS — L97319 Non-pressure chronic ulcer of right ankle with unspecified severity: Secondary | ICD-10-CM | POA: Diagnosis not present

## 2022-07-11 DIAGNOSIS — Z950 Presence of cardiac pacemaker: Secondary | ICD-10-CM | POA: Diagnosis not present

## 2022-07-21 DIAGNOSIS — Z950 Presence of cardiac pacemaker: Secondary | ICD-10-CM | POA: Diagnosis not present

## 2022-08-13 ENCOUNTER — Encounter (INDEPENDENT_AMBULATORY_CARE_PROVIDER_SITE_OTHER): Payer: Self-pay

## 2022-08-16 ENCOUNTER — Other Ambulatory Visit: Payer: Self-pay | Admitting: Internal Medicine

## 2022-08-16 DIAGNOSIS — I6523 Occlusion and stenosis of bilateral carotid arteries: Secondary | ICD-10-CM

## 2022-08-16 DIAGNOSIS — R001 Bradycardia, unspecified: Secondary | ICD-10-CM

## 2022-08-16 DIAGNOSIS — I771 Stricture of artery: Secondary | ICD-10-CM

## 2022-08-16 DIAGNOSIS — I050 Rheumatic mitral stenosis: Secondary | ICD-10-CM

## 2022-08-29 ENCOUNTER — Ambulatory Visit: Admission: RE | Admit: 2022-08-29 | Payer: PPO | Source: Ambulatory Visit

## 2022-09-05 ENCOUNTER — Other Ambulatory Visit: Payer: PPO

## 2022-09-10 ENCOUNTER — Ambulatory Visit
Admission: RE | Admit: 2022-09-10 | Discharge: 2022-09-10 | Disposition: A | Discharge status: Home / Self Care | Payer: PPO | Source: Ambulatory Visit | Attending: Internal Medicine | Admitting: Internal Medicine

## 2022-09-10 DIAGNOSIS — I050 Rheumatic mitral stenosis: Secondary | ICD-10-CM | POA: Diagnosis not present

## 2022-09-10 DIAGNOSIS — I771 Stricture of artery: Secondary | ICD-10-CM | POA: Diagnosis not present

## 2022-09-10 DIAGNOSIS — J984 Other disorders of lung: Secondary | ICD-10-CM | POA: Diagnosis not present

## 2022-09-10 DIAGNOSIS — I6523 Occlusion and stenosis of bilateral carotid arteries: Secondary | ICD-10-CM | POA: Diagnosis not present

## 2022-09-10 DIAGNOSIS — I2510 Atherosclerotic heart disease of native coronary artery without angina pectoris: Secondary | ICD-10-CM | POA: Diagnosis not present

## 2022-09-10 DIAGNOSIS — I350 Nonrheumatic aortic (valve) stenosis: Secondary | ICD-10-CM | POA: Diagnosis not present

## 2022-09-10 DIAGNOSIS — R001 Bradycardia, unspecified: Secondary | ICD-10-CM | POA: Insufficient documentation

## 2022-09-10 LAB — POCT I-STAT CREATININE: Creatinine, Ser: 0.8 mg/dL (ref 0.44–1.00)

## 2022-09-10 MED ORDER — IOHEXOL 350 MG/ML SOLN
75.0000 mL | Freq: Once | INTRAVENOUS | Status: AC | PRN
  Administered 2022-09-10: 75 mL via INTRAVENOUS

## 2022-09-25 DIAGNOSIS — M79671 Pain in right foot: Secondary | ICD-10-CM | POA: Diagnosis not present

## 2022-09-25 DIAGNOSIS — R350 Frequency of micturition: Secondary | ICD-10-CM | POA: Diagnosis not present

## 2022-09-25 DIAGNOSIS — B3731 Acute candidiasis of vulva and vagina: Secondary | ICD-10-CM | POA: Diagnosis not present

## 2022-10-01 DIAGNOSIS — L03031 Cellulitis of right toe: Secondary | ICD-10-CM | POA: Diagnosis not present

## 2022-10-02 DIAGNOSIS — M79671 Pain in right foot: Secondary | ICD-10-CM | POA: Diagnosis not present

## 2022-10-02 DIAGNOSIS — Z7902 Long term (current) use of antithrombotics/antiplatelets: Secondary | ICD-10-CM | POA: Diagnosis not present

## 2022-10-02 DIAGNOSIS — M7989 Other specified soft tissue disorders: Secondary | ICD-10-CM | POA: Diagnosis not present

## 2022-10-02 DIAGNOSIS — Z7984 Long term (current) use of oral hypoglycemic drugs: Secondary | ICD-10-CM | POA: Diagnosis not present

## 2022-10-02 DIAGNOSIS — Z6828 Body mass index (BMI) 28.0-28.9, adult: Secondary | ICD-10-CM | POA: Diagnosis not present

## 2022-10-02 DIAGNOSIS — L03115 Cellulitis of right lower limb: Secondary | ICD-10-CM | POA: Diagnosis not present

## 2022-10-02 DIAGNOSIS — Z87891 Personal history of nicotine dependence: Secondary | ICD-10-CM | POA: Diagnosis not present

## 2022-10-02 DIAGNOSIS — F325 Major depressive disorder, single episode, in full remission: Secondary | ICD-10-CM | POA: Diagnosis not present

## 2022-10-02 DIAGNOSIS — Z7982 Long term (current) use of aspirin: Secondary | ICD-10-CM | POA: Diagnosis not present

## 2022-10-02 DIAGNOSIS — Z89612 Acquired absence of left leg above knee: Secondary | ICD-10-CM | POA: Diagnosis not present

## 2022-10-02 DIAGNOSIS — I2510 Atherosclerotic heart disease of native coronary artery without angina pectoris: Secondary | ICD-10-CM | POA: Diagnosis not present

## 2022-10-02 DIAGNOSIS — E261 Secondary hyperaldosteronism: Secondary | ICD-10-CM | POA: Diagnosis not present

## 2022-10-02 DIAGNOSIS — E1151 Type 2 diabetes mellitus with diabetic peripheral angiopathy without gangrene: Secondary | ICD-10-CM | POA: Diagnosis not present

## 2022-10-02 DIAGNOSIS — S91301A Unspecified open wound, right foot, initial encounter: Secondary | ICD-10-CM | POA: Diagnosis not present

## 2022-10-02 DIAGNOSIS — I509 Heart failure, unspecified: Secondary | ICD-10-CM | POA: Diagnosis not present

## 2022-10-10 DIAGNOSIS — L03115 Cellulitis of right lower limb: Secondary | ICD-10-CM | POA: Diagnosis not present

## 2022-10-10 DIAGNOSIS — S91301A Unspecified open wound, right foot, initial encounter: Secondary | ICD-10-CM | POA: Diagnosis not present

## 2022-10-11 DIAGNOSIS — L03115 Cellulitis of right lower limb: Secondary | ICD-10-CM | POA: Diagnosis not present

## 2022-10-17 DIAGNOSIS — S91301A Unspecified open wound, right foot, initial encounter: Secondary | ICD-10-CM | POA: Diagnosis not present

## 2022-10-17 DIAGNOSIS — L03115 Cellulitis of right lower limb: Secondary | ICD-10-CM | POA: Diagnosis not present

## 2022-10-27 DIAGNOSIS — I7772 Dissection of iliac artery: Secondary | ICD-10-CM | POA: Diagnosis not present

## 2022-10-27 DIAGNOSIS — E785 Hyperlipidemia, unspecified: Secondary | ICD-10-CM | POA: Diagnosis not present

## 2022-10-27 DIAGNOSIS — I110 Hypertensive heart disease with heart failure: Secondary | ICD-10-CM | POA: Diagnosis not present

## 2022-10-27 DIAGNOSIS — Z95828 Presence of other vascular implants and grafts: Secondary | ICD-10-CM | POA: Diagnosis not present

## 2022-10-27 DIAGNOSIS — I7092 Chronic total occlusion of artery of the extremities: Secondary | ICD-10-CM | POA: Diagnosis not present

## 2022-10-27 DIAGNOSIS — I739 Peripheral vascular disease, unspecified: Secondary | ICD-10-CM | POA: Diagnosis not present

## 2022-10-27 DIAGNOSIS — E119 Type 2 diabetes mellitus without complications: Secondary | ICD-10-CM | POA: Diagnosis not present

## 2022-10-27 DIAGNOSIS — M79604 Pain in right leg: Secondary | ICD-10-CM | POA: Diagnosis not present

## 2022-10-27 DIAGNOSIS — Z79899 Other long term (current) drug therapy: Secondary | ICD-10-CM | POA: Diagnosis not present

## 2022-10-27 DIAGNOSIS — M79671 Pain in right foot: Secondary | ICD-10-CM | POA: Diagnosis not present

## 2022-10-27 DIAGNOSIS — L03115 Cellulitis of right lower limb: Secondary | ICD-10-CM | POA: Diagnosis not present

## 2022-10-27 DIAGNOSIS — Z1152 Encounter for screening for COVID-19: Secondary | ICD-10-CM | POA: Diagnosis not present

## 2022-10-27 DIAGNOSIS — I5022 Chronic systolic (congestive) heart failure: Secondary | ICD-10-CM | POA: Diagnosis not present

## 2022-10-27 DIAGNOSIS — Z7982 Long term (current) use of aspirin: Secondary | ICD-10-CM | POA: Diagnosis not present

## 2022-10-27 DIAGNOSIS — M7989 Other specified soft tissue disorders: Secondary | ICD-10-CM | POA: Diagnosis not present

## 2022-10-27 DIAGNOSIS — I70203 Unspecified atherosclerosis of native arteries of extremities, bilateral legs: Secondary | ICD-10-CM | POA: Diagnosis not present

## 2022-10-28 DIAGNOSIS — I7772 Dissection of iliac artery: Secondary | ICD-10-CM | POA: Diagnosis not present

## 2022-10-28 DIAGNOSIS — Z95828 Presence of other vascular implants and grafts: Secondary | ICD-10-CM | POA: Diagnosis not present

## 2022-10-28 DIAGNOSIS — I70203 Unspecified atherosclerosis of native arteries of extremities, bilateral legs: Secondary | ICD-10-CM | POA: Diagnosis not present

## 2022-11-02 ENCOUNTER — Ambulatory Visit: Payer: PPO | Admitting: Physician Assistant

## 2022-11-02 DIAGNOSIS — M19071 Primary osteoarthritis, right ankle and foot: Secondary | ICD-10-CM | POA: Diagnosis not present

## 2022-11-02 DIAGNOSIS — Z801 Family history of malignant neoplasm of trachea, bronchus and lung: Secondary | ICD-10-CM | POA: Diagnosis not present

## 2022-11-02 DIAGNOSIS — K5900 Constipation, unspecified: Secondary | ICD-10-CM | POA: Diagnosis not present

## 2022-11-02 DIAGNOSIS — I110 Hypertensive heart disease with heart failure: Secondary | ICD-10-CM | POA: Diagnosis not present

## 2022-11-02 DIAGNOSIS — Z7902 Long term (current) use of antithrombotics/antiplatelets: Secondary | ICD-10-CM | POA: Diagnosis not present

## 2022-11-02 DIAGNOSIS — L97519 Non-pressure chronic ulcer of other part of right foot with unspecified severity: Secondary | ICD-10-CM | POA: Diagnosis not present

## 2022-11-02 DIAGNOSIS — Z950 Presence of cardiac pacemaker: Secondary | ICD-10-CM | POA: Diagnosis not present

## 2022-11-02 DIAGNOSIS — I70235 Atherosclerosis of native arteries of right leg with ulceration of other part of foot: Secondary | ICD-10-CM | POA: Diagnosis not present

## 2022-11-02 DIAGNOSIS — I7092 Chronic total occlusion of artery of the extremities: Secondary | ICD-10-CM | POA: Diagnosis not present

## 2022-11-02 DIAGNOSIS — Z7984 Long term (current) use of oral hypoglycemic drugs: Secondary | ICD-10-CM | POA: Diagnosis not present

## 2022-11-02 DIAGNOSIS — I70221 Atherosclerosis of native arteries of extremities with rest pain, right leg: Secondary | ICD-10-CM | POA: Diagnosis not present

## 2022-11-02 DIAGNOSIS — E669 Obesity, unspecified: Secondary | ICD-10-CM | POA: Diagnosis not present

## 2022-11-02 DIAGNOSIS — E118 Type 2 diabetes mellitus with unspecified complications: Secondary | ICD-10-CM | POA: Diagnosis not present

## 2022-11-02 DIAGNOSIS — Z89612 Acquired absence of left leg above knee: Secondary | ICD-10-CM | POA: Diagnosis not present

## 2022-11-02 DIAGNOSIS — I70229 Atherosclerosis of native arteries of extremities with rest pain, unspecified extremity: Secondary | ICD-10-CM | POA: Diagnosis not present

## 2022-11-02 DIAGNOSIS — Z823 Family history of stroke: Secondary | ICD-10-CM | POA: Diagnosis not present

## 2022-11-02 DIAGNOSIS — R001 Bradycardia, unspecified: Secondary | ICD-10-CM | POA: Diagnosis not present

## 2022-11-02 DIAGNOSIS — R936 Abnormal findings on diagnostic imaging of limbs: Secondary | ICD-10-CM | POA: Diagnosis not present

## 2022-11-02 DIAGNOSIS — I4519 Other right bundle-branch block: Secondary | ICD-10-CM | POA: Diagnosis not present

## 2022-11-02 DIAGNOSIS — Z96611 Presence of right artificial shoulder joint: Secondary | ICD-10-CM | POA: Diagnosis not present

## 2022-11-02 DIAGNOSIS — I739 Peripheral vascular disease, unspecified: Secondary | ICD-10-CM | POA: Diagnosis not present

## 2022-11-02 DIAGNOSIS — E1140 Type 2 diabetes mellitus with diabetic neuropathy, unspecified: Secondary | ICD-10-CM | POA: Diagnosis not present

## 2022-11-02 DIAGNOSIS — Z90710 Acquired absence of both cervix and uterus: Secondary | ICD-10-CM | POA: Diagnosis not present

## 2022-11-02 DIAGNOSIS — E1151 Type 2 diabetes mellitus with diabetic peripheral angiopathy without gangrene: Secondary | ICD-10-CM | POA: Diagnosis not present

## 2022-11-02 DIAGNOSIS — I10 Essential (primary) hypertension: Secondary | ICD-10-CM | POA: Diagnosis not present

## 2022-11-02 DIAGNOSIS — Z9109 Other allergy status, other than to drugs and biological substances: Secondary | ICD-10-CM | POA: Diagnosis not present

## 2022-11-02 DIAGNOSIS — E785 Hyperlipidemia, unspecified: Secondary | ICD-10-CM | POA: Diagnosis not present

## 2022-11-02 DIAGNOSIS — I771 Stricture of artery: Secondary | ICD-10-CM | POA: Diagnosis not present

## 2022-11-02 DIAGNOSIS — I2510 Atherosclerotic heart disease of native coronary artery without angina pectoris: Secondary | ICD-10-CM | POA: Diagnosis not present

## 2022-11-02 DIAGNOSIS — Z87891 Personal history of nicotine dependence: Secondary | ICD-10-CM | POA: Diagnosis not present

## 2022-11-02 DIAGNOSIS — I5032 Chronic diastolic (congestive) heart failure: Secondary | ICD-10-CM | POA: Diagnosis not present

## 2022-11-02 DIAGNOSIS — Z452 Encounter for adjustment and management of vascular access device: Secondary | ICD-10-CM | POA: Diagnosis not present

## 2022-11-02 DIAGNOSIS — Z6832 Body mass index (BMI) 32.0-32.9, adult: Secondary | ICD-10-CM | POA: Diagnosis not present

## 2022-11-02 DIAGNOSIS — R011 Cardiac murmur, unspecified: Secondary | ICD-10-CM | POA: Diagnosis not present

## 2022-11-02 DIAGNOSIS — Z9861 Coronary angioplasty status: Secondary | ICD-10-CM | POA: Diagnosis not present

## 2022-11-02 DIAGNOSIS — I4510 Unspecified right bundle-branch block: Secondary | ICD-10-CM | POA: Diagnosis not present

## 2022-11-02 DIAGNOSIS — L97419 Non-pressure chronic ulcer of right heel and midfoot with unspecified severity: Secondary | ICD-10-CM | POA: Diagnosis not present

## 2022-11-02 DIAGNOSIS — Z86010 Personal history of colonic polyps: Secondary | ICD-10-CM | POA: Diagnosis not present

## 2022-11-02 DIAGNOSIS — I6523 Occlusion and stenosis of bilateral carotid arteries: Secondary | ICD-10-CM | POA: Diagnosis not present

## 2022-11-02 DIAGNOSIS — S91301A Unspecified open wound, right foot, initial encounter: Secondary | ICD-10-CM | POA: Diagnosis not present

## 2022-11-02 DIAGNOSIS — Z7982 Long term (current) use of aspirin: Secondary | ICD-10-CM | POA: Diagnosis not present

## 2022-11-02 DIAGNOSIS — Z01818 Encounter for other preprocedural examination: Secondary | ICD-10-CM | POA: Diagnosis not present

## 2022-11-02 DIAGNOSIS — I498 Other specified cardiac arrhythmias: Secondary | ICD-10-CM | POA: Diagnosis not present

## 2022-11-07 HISTORY — PX: OTHER SURGICAL HISTORY: SHX169

## 2022-11-13 DIAGNOSIS — E1140 Type 2 diabetes mellitus with diabetic neuropathy, unspecified: Secondary | ICD-10-CM | POA: Diagnosis not present

## 2022-11-13 DIAGNOSIS — N1832 Chronic kidney disease, stage 3b: Secondary | ICD-10-CM | POA: Diagnosis not present

## 2022-11-13 DIAGNOSIS — K5790 Diverticulosis of intestine, part unspecified, without perforation or abscess without bleeding: Secondary | ICD-10-CM | POA: Diagnosis not present

## 2022-11-13 DIAGNOSIS — F419 Anxiety disorder, unspecified: Secondary | ICD-10-CM | POA: Diagnosis not present

## 2022-11-13 DIAGNOSIS — I5042 Chronic combined systolic (congestive) and diastolic (congestive) heart failure: Secondary | ICD-10-CM | POA: Diagnosis not present

## 2022-11-13 DIAGNOSIS — E785 Hyperlipidemia, unspecified: Secondary | ICD-10-CM | POA: Diagnosis not present

## 2022-11-13 DIAGNOSIS — I771 Stricture of artery: Secondary | ICD-10-CM | POA: Diagnosis not present

## 2022-11-13 DIAGNOSIS — Z7902 Long term (current) use of antithrombotics/antiplatelets: Secondary | ICD-10-CM | POA: Diagnosis not present

## 2022-11-13 DIAGNOSIS — I70238 Atherosclerosis of native arteries of right leg with ulceration of other part of lower right leg: Secondary | ICD-10-CM | POA: Diagnosis not present

## 2022-11-13 DIAGNOSIS — Z96611 Presence of right artificial shoulder joint: Secondary | ICD-10-CM | POA: Diagnosis not present

## 2022-11-13 DIAGNOSIS — I5030 Unspecified diastolic (congestive) heart failure: Secondary | ICD-10-CM | POA: Diagnosis not present

## 2022-11-13 DIAGNOSIS — D509 Iron deficiency anemia, unspecified: Secondary | ICD-10-CM | POA: Diagnosis not present

## 2022-11-13 DIAGNOSIS — Z955 Presence of coronary angioplasty implant and graft: Secondary | ICD-10-CM | POA: Diagnosis not present

## 2022-11-13 DIAGNOSIS — E669 Obesity, unspecified: Secondary | ICD-10-CM | POA: Diagnosis not present

## 2022-11-13 DIAGNOSIS — Z8616 Personal history of COVID-19: Secondary | ICD-10-CM | POA: Diagnosis not present

## 2022-11-13 DIAGNOSIS — H3530 Unspecified macular degeneration: Secondary | ICD-10-CM | POA: Diagnosis not present

## 2022-11-13 DIAGNOSIS — I69351 Hemiplegia and hemiparesis following cerebral infarction affecting right dominant side: Secondary | ICD-10-CM | POA: Diagnosis not present

## 2022-11-13 DIAGNOSIS — J4489 Other specified chronic obstructive pulmonary disease: Secondary | ICD-10-CM | POA: Diagnosis not present

## 2022-11-13 DIAGNOSIS — K219 Gastro-esophageal reflux disease without esophagitis: Secondary | ICD-10-CM | POA: Diagnosis not present

## 2022-11-13 DIAGNOSIS — Z7901 Long term (current) use of anticoagulants: Secondary | ICD-10-CM | POA: Diagnosis not present

## 2022-11-13 DIAGNOSIS — L97912 Non-pressure chronic ulcer of unspecified part of right lower leg with fat layer exposed: Secondary | ICD-10-CM | POA: Diagnosis not present

## 2022-11-13 DIAGNOSIS — T8189XD Other complications of procedures, not elsewhere classified, subsequent encounter: Secondary | ICD-10-CM | POA: Diagnosis not present

## 2022-11-13 DIAGNOSIS — E871 Hypo-osmolality and hyponatremia: Secondary | ICD-10-CM | POA: Diagnosis not present

## 2022-11-13 DIAGNOSIS — M47812 Spondylosis without myelopathy or radiculopathy, cervical region: Secondary | ICD-10-CM | POA: Diagnosis not present

## 2022-11-13 DIAGNOSIS — I130 Hypertensive heart and chronic kidney disease with heart failure and stage 1 through stage 4 chronic kidney disease, or unspecified chronic kidney disease: Secondary | ICD-10-CM | POA: Diagnosis not present

## 2022-11-13 DIAGNOSIS — H547 Unspecified visual loss: Secondary | ICD-10-CM | POA: Diagnosis not present

## 2022-11-13 DIAGNOSIS — E7800 Pure hypercholesterolemia, unspecified: Secondary | ICD-10-CM | POA: Diagnosis not present

## 2022-11-13 DIAGNOSIS — Z6832 Body mass index (BMI) 32.0-32.9, adult: Secondary | ICD-10-CM | POA: Diagnosis not present

## 2022-11-13 DIAGNOSIS — E1151 Type 2 diabetes mellitus with diabetic peripheral angiopathy without gangrene: Secondary | ICD-10-CM | POA: Diagnosis not present

## 2022-11-13 DIAGNOSIS — I70219 Atherosclerosis of native arteries of extremities with intermittent claudication, unspecified extremity: Secondary | ICD-10-CM | POA: Diagnosis not present

## 2022-11-13 DIAGNOSIS — I110 Hypertensive heart disease with heart failure: Secondary | ICD-10-CM | POA: Diagnosis not present

## 2022-11-13 DIAGNOSIS — I69320 Aphasia following cerebral infarction: Secondary | ICD-10-CM | POA: Diagnosis not present

## 2022-11-13 DIAGNOSIS — I2510 Atherosclerotic heart disease of native coronary artery without angina pectoris: Secondary | ICD-10-CM | POA: Diagnosis not present

## 2022-11-13 DIAGNOSIS — E1122 Type 2 diabetes mellitus with diabetic chronic kidney disease: Secondary | ICD-10-CM | POA: Diagnosis not present

## 2022-11-13 DIAGNOSIS — E1142 Type 2 diabetes mellitus with diabetic polyneuropathy: Secondary | ICD-10-CM | POA: Diagnosis not present

## 2022-11-13 DIAGNOSIS — I480 Paroxysmal atrial fibrillation: Secondary | ICD-10-CM | POA: Diagnosis not present

## 2022-11-13 DIAGNOSIS — I272 Pulmonary hypertension, unspecified: Secondary | ICD-10-CM | POA: Diagnosis not present

## 2022-11-13 DIAGNOSIS — I441 Atrioventricular block, second degree: Secondary | ICD-10-CM | POA: Diagnosis not present

## 2022-11-13 DIAGNOSIS — K5900 Constipation, unspecified: Secondary | ICD-10-CM | POA: Diagnosis not present

## 2022-11-13 DIAGNOSIS — F32A Depression, unspecified: Secondary | ICD-10-CM | POA: Diagnosis not present

## 2022-11-13 DIAGNOSIS — I70211 Atherosclerosis of native arteries of extremities with intermittent claudication, right leg: Secondary | ICD-10-CM | POA: Diagnosis not present

## 2022-11-13 DIAGNOSIS — Z950 Presence of cardiac pacemaker: Secondary | ICD-10-CM | POA: Diagnosis not present

## 2022-11-13 DIAGNOSIS — Z951 Presence of aortocoronary bypass graft: Secondary | ICD-10-CM | POA: Diagnosis not present

## 2022-11-13 DIAGNOSIS — Z48812 Encounter for surgical aftercare following surgery on the circulatory system: Secondary | ICD-10-CM | POA: Diagnosis not present

## 2022-11-13 DIAGNOSIS — Z89612 Acquired absence of left leg above knee: Secondary | ICD-10-CM | POA: Diagnosis not present

## 2022-11-13 DIAGNOSIS — Z7984 Long term (current) use of oral hypoglycemic drugs: Secondary | ICD-10-CM | POA: Diagnosis not present

## 2022-11-16 ENCOUNTER — Ambulatory Visit: Payer: PPO | Admitting: Physician Assistant

## 2022-11-17 DIAGNOSIS — Z89612 Acquired absence of left leg above knee: Secondary | ICD-10-CM | POA: Diagnosis not present

## 2022-11-17 DIAGNOSIS — Z7902 Long term (current) use of antithrombotics/antiplatelets: Secondary | ICD-10-CM | POA: Diagnosis not present

## 2022-11-17 DIAGNOSIS — E1140 Type 2 diabetes mellitus with diabetic neuropathy, unspecified: Secondary | ICD-10-CM | POA: Diagnosis not present

## 2022-11-17 DIAGNOSIS — E11622 Type 2 diabetes mellitus with other skin ulcer: Secondary | ICD-10-CM | POA: Diagnosis not present

## 2022-11-17 DIAGNOSIS — E1151 Type 2 diabetes mellitus with diabetic peripheral angiopathy without gangrene: Secondary | ICD-10-CM | POA: Diagnosis not present

## 2022-11-17 DIAGNOSIS — E785 Hyperlipidemia, unspecified: Secondary | ICD-10-CM | POA: Diagnosis not present

## 2022-11-17 DIAGNOSIS — I2510 Atherosclerotic heart disease of native coronary artery without angina pectoris: Secondary | ICD-10-CM | POA: Diagnosis not present

## 2022-11-17 DIAGNOSIS — J9811 Atelectasis: Secondary | ICD-10-CM | POA: Diagnosis not present

## 2022-11-17 DIAGNOSIS — I5030 Unspecified diastolic (congestive) heart failure: Secondary | ICD-10-CM | POA: Diagnosis not present

## 2022-11-17 DIAGNOSIS — I110 Hypertensive heart disease with heart failure: Secondary | ICD-10-CM | POA: Diagnosis not present

## 2022-11-17 DIAGNOSIS — I959 Hypotension, unspecified: Secondary | ICD-10-CM | POA: Diagnosis not present

## 2022-11-17 DIAGNOSIS — L03115 Cellulitis of right lower limb: Secondary | ICD-10-CM | POA: Diagnosis not present

## 2022-11-17 DIAGNOSIS — R001 Bradycardia, unspecified: Secondary | ICD-10-CM | POA: Diagnosis not present

## 2022-11-17 DIAGNOSIS — Z96611 Presence of right artificial shoulder joint: Secondary | ICD-10-CM | POA: Diagnosis not present

## 2022-11-17 DIAGNOSIS — L089 Local infection of the skin and subcutaneous tissue, unspecified: Secondary | ICD-10-CM | POA: Diagnosis not present

## 2022-11-17 DIAGNOSIS — I441 Atrioventricular block, second degree: Secondary | ICD-10-CM | POA: Diagnosis not present

## 2022-11-17 DIAGNOSIS — L97219 Non-pressure chronic ulcer of right calf with unspecified severity: Secondary | ICD-10-CM | POA: Diagnosis not present

## 2022-11-17 DIAGNOSIS — Z7984 Long term (current) use of oral hypoglycemic drugs: Secondary | ICD-10-CM | POA: Diagnosis not present

## 2022-11-17 DIAGNOSIS — Z91048 Other nonmedicinal substance allergy status: Secondary | ICD-10-CM | POA: Diagnosis not present

## 2022-11-17 DIAGNOSIS — Z79899 Other long term (current) drug therapy: Secondary | ICD-10-CM | POA: Diagnosis not present

## 2022-11-17 DIAGNOSIS — Z950 Presence of cardiac pacemaker: Secondary | ICD-10-CM | POA: Diagnosis not present

## 2022-11-17 DIAGNOSIS — Z9861 Coronary angioplasty status: Secondary | ICD-10-CM | POA: Diagnosis not present

## 2022-11-17 DIAGNOSIS — Z7901 Long term (current) use of anticoagulants: Secondary | ICD-10-CM | POA: Diagnosis not present

## 2022-11-17 DIAGNOSIS — R509 Fever, unspecified: Secondary | ICD-10-CM | POA: Diagnosis not present

## 2022-11-17 DIAGNOSIS — I70211 Atherosclerosis of native arteries of extremities with intermittent claudication, right leg: Secondary | ICD-10-CM | POA: Diagnosis not present

## 2022-11-17 DIAGNOSIS — Z9049 Acquired absence of other specified parts of digestive tract: Secondary | ICD-10-CM | POA: Diagnosis not present

## 2022-11-17 DIAGNOSIS — I5032 Chronic diastolic (congestive) heart failure: Secondary | ICD-10-CM | POA: Diagnosis not present

## 2022-11-17 DIAGNOSIS — R0689 Other abnormalities of breathing: Secondary | ICD-10-CM | POA: Diagnosis not present

## 2022-11-17 DIAGNOSIS — R600 Localized edema: Secondary | ICD-10-CM | POA: Diagnosis not present

## 2022-11-17 DIAGNOSIS — Z87891 Personal history of nicotine dependence: Secondary | ICD-10-CM | POA: Diagnosis not present

## 2022-11-17 DIAGNOSIS — Z48812 Encounter for surgical aftercare following surgery on the circulatory system: Secondary | ICD-10-CM | POA: Diagnosis not present

## 2022-11-17 DIAGNOSIS — R931 Abnormal findings on diagnostic imaging of heart and coronary circulation: Secondary | ICD-10-CM | POA: Diagnosis not present

## 2022-11-20 DIAGNOSIS — I110 Hypertensive heart disease with heart failure: Secondary | ICD-10-CM | POA: Diagnosis not present

## 2022-11-20 DIAGNOSIS — I70211 Atherosclerosis of native arteries of extremities with intermittent claudication, right leg: Secondary | ICD-10-CM | POA: Diagnosis not present

## 2022-11-20 DIAGNOSIS — E1151 Type 2 diabetes mellitus with diabetic peripheral angiopathy without gangrene: Secondary | ICD-10-CM | POA: Diagnosis not present

## 2022-11-20 DIAGNOSIS — I441 Atrioventricular block, second degree: Secondary | ICD-10-CM | POA: Diagnosis not present

## 2022-11-20 DIAGNOSIS — E1140 Type 2 diabetes mellitus with diabetic neuropathy, unspecified: Secondary | ICD-10-CM | POA: Diagnosis not present

## 2022-11-20 DIAGNOSIS — I5030 Unspecified diastolic (congestive) heart failure: Secondary | ICD-10-CM | POA: Diagnosis not present

## 2022-11-20 DIAGNOSIS — Z48812 Encounter for surgical aftercare following surgery on the circulatory system: Secondary | ICD-10-CM | POA: Diagnosis not present

## 2022-11-23 DIAGNOSIS — Z955 Presence of coronary angioplasty implant and graft: Secondary | ICD-10-CM | POA: Diagnosis not present

## 2022-11-23 DIAGNOSIS — Z950 Presence of cardiac pacemaker: Secondary | ICD-10-CM | POA: Diagnosis not present

## 2022-11-23 DIAGNOSIS — Z951 Presence of aortocoronary bypass graft: Secondary | ICD-10-CM | POA: Diagnosis not present

## 2022-11-23 DIAGNOSIS — M47812 Spondylosis without myelopathy or radiculopathy, cervical region: Secondary | ICD-10-CM | POA: Diagnosis not present

## 2022-11-23 DIAGNOSIS — E1151 Type 2 diabetes mellitus with diabetic peripheral angiopathy without gangrene: Secondary | ICD-10-CM | POA: Diagnosis not present

## 2022-11-23 DIAGNOSIS — E1140 Type 2 diabetes mellitus with diabetic neuropathy, unspecified: Secondary | ICD-10-CM | POA: Diagnosis not present

## 2022-11-23 DIAGNOSIS — Z89612 Acquired absence of left leg above knee: Secondary | ICD-10-CM | POA: Diagnosis not present

## 2022-11-23 DIAGNOSIS — I2510 Atherosclerotic heart disease of native coronary artery without angina pectoris: Secondary | ICD-10-CM | POA: Diagnosis not present

## 2022-11-23 DIAGNOSIS — I110 Hypertensive heart disease with heart failure: Secondary | ICD-10-CM | POA: Diagnosis not present

## 2022-11-23 DIAGNOSIS — I5030 Unspecified diastolic (congestive) heart failure: Secondary | ICD-10-CM | POA: Diagnosis not present

## 2022-11-23 DIAGNOSIS — K5900 Constipation, unspecified: Secondary | ICD-10-CM | POA: Diagnosis not present

## 2022-11-23 DIAGNOSIS — E785 Hyperlipidemia, unspecified: Secondary | ICD-10-CM | POA: Diagnosis not present

## 2022-11-23 DIAGNOSIS — I70211 Atherosclerosis of native arteries of extremities with intermittent claudication, right leg: Secondary | ICD-10-CM | POA: Diagnosis not present

## 2022-11-23 DIAGNOSIS — Z7901 Long term (current) use of anticoagulants: Secondary | ICD-10-CM | POA: Diagnosis not present

## 2022-11-23 DIAGNOSIS — Z6832 Body mass index (BMI) 32.0-32.9, adult: Secondary | ICD-10-CM | POA: Diagnosis not present

## 2022-11-23 DIAGNOSIS — I441 Atrioventricular block, second degree: Secondary | ICD-10-CM | POA: Diagnosis not present

## 2022-11-23 DIAGNOSIS — Z48812 Encounter for surgical aftercare following surgery on the circulatory system: Secondary | ICD-10-CM | POA: Diagnosis not present

## 2022-11-23 DIAGNOSIS — E669 Obesity, unspecified: Secondary | ICD-10-CM | POA: Diagnosis not present

## 2022-11-23 DIAGNOSIS — Z8616 Personal history of COVID-19: Secondary | ICD-10-CM | POA: Diagnosis not present

## 2022-11-23 DIAGNOSIS — D509 Iron deficiency anemia, unspecified: Secondary | ICD-10-CM | POA: Diagnosis not present

## 2022-11-23 DIAGNOSIS — Z96611 Presence of right artificial shoulder joint: Secondary | ICD-10-CM | POA: Diagnosis not present

## 2022-11-23 DIAGNOSIS — Z7984 Long term (current) use of oral hypoglycemic drugs: Secondary | ICD-10-CM | POA: Diagnosis not present

## 2022-11-23 DIAGNOSIS — Z7902 Long term (current) use of antithrombotics/antiplatelets: Secondary | ICD-10-CM | POA: Diagnosis not present

## 2022-11-23 DIAGNOSIS — K5790 Diverticulosis of intestine, part unspecified, without perforation or abscess without bleeding: Secondary | ICD-10-CM | POA: Diagnosis not present

## 2022-11-27 DIAGNOSIS — E1151 Type 2 diabetes mellitus with diabetic peripheral angiopathy without gangrene: Secondary | ICD-10-CM | POA: Diagnosis not present

## 2022-11-27 DIAGNOSIS — I5030 Unspecified diastolic (congestive) heart failure: Secondary | ICD-10-CM | POA: Diagnosis not present

## 2022-11-27 DIAGNOSIS — K5790 Diverticulosis of intestine, part unspecified, without perforation or abscess without bleeding: Secondary | ICD-10-CM | POA: Diagnosis not present

## 2022-11-27 DIAGNOSIS — Z48812 Encounter for surgical aftercare following surgery on the circulatory system: Secondary | ICD-10-CM | POA: Diagnosis not present

## 2022-11-27 DIAGNOSIS — Z7984 Long term (current) use of oral hypoglycemic drugs: Secondary | ICD-10-CM | POA: Diagnosis not present

## 2022-11-27 DIAGNOSIS — Z955 Presence of coronary angioplasty implant and graft: Secondary | ICD-10-CM | POA: Diagnosis not present

## 2022-11-27 DIAGNOSIS — Z89612 Acquired absence of left leg above knee: Secondary | ICD-10-CM | POA: Diagnosis not present

## 2022-11-27 DIAGNOSIS — I441 Atrioventricular block, second degree: Secondary | ICD-10-CM | POA: Diagnosis not present

## 2022-11-27 DIAGNOSIS — Z7901 Long term (current) use of anticoagulants: Secondary | ICD-10-CM | POA: Diagnosis not present

## 2022-11-27 DIAGNOSIS — I2510 Atherosclerotic heart disease of native coronary artery without angina pectoris: Secondary | ICD-10-CM | POA: Diagnosis not present

## 2022-11-27 DIAGNOSIS — E669 Obesity, unspecified: Secondary | ICD-10-CM | POA: Diagnosis not present

## 2022-11-27 DIAGNOSIS — Z950 Presence of cardiac pacemaker: Secondary | ICD-10-CM | POA: Diagnosis not present

## 2022-11-27 DIAGNOSIS — M47812 Spondylosis without myelopathy or radiculopathy, cervical region: Secondary | ICD-10-CM | POA: Diagnosis not present

## 2022-11-27 DIAGNOSIS — E785 Hyperlipidemia, unspecified: Secondary | ICD-10-CM | POA: Diagnosis not present

## 2022-11-27 DIAGNOSIS — Z951 Presence of aortocoronary bypass graft: Secondary | ICD-10-CM | POA: Diagnosis not present

## 2022-11-27 DIAGNOSIS — I70211 Atherosclerosis of native arteries of extremities with intermittent claudication, right leg: Secondary | ICD-10-CM | POA: Diagnosis not present

## 2022-11-27 DIAGNOSIS — Z6832 Body mass index (BMI) 32.0-32.9, adult: Secondary | ICD-10-CM | POA: Diagnosis not present

## 2022-11-27 DIAGNOSIS — Z96611 Presence of right artificial shoulder joint: Secondary | ICD-10-CM | POA: Diagnosis not present

## 2022-11-27 DIAGNOSIS — E1140 Type 2 diabetes mellitus with diabetic neuropathy, unspecified: Secondary | ICD-10-CM | POA: Diagnosis not present

## 2022-11-27 DIAGNOSIS — K5900 Constipation, unspecified: Secondary | ICD-10-CM | POA: Diagnosis not present

## 2022-11-27 DIAGNOSIS — Z8616 Personal history of COVID-19: Secondary | ICD-10-CM | POA: Diagnosis not present

## 2022-11-27 DIAGNOSIS — I110 Hypertensive heart disease with heart failure: Secondary | ICD-10-CM | POA: Diagnosis not present

## 2022-11-27 DIAGNOSIS — D509 Iron deficiency anemia, unspecified: Secondary | ICD-10-CM | POA: Diagnosis not present

## 2022-11-27 DIAGNOSIS — Z7902 Long term (current) use of antithrombotics/antiplatelets: Secondary | ICD-10-CM | POA: Diagnosis not present

## 2022-12-03 DIAGNOSIS — L98499 Non-pressure chronic ulcer of skin of other sites with unspecified severity: Secondary | ICD-10-CM | POA: Diagnosis not present

## 2022-12-03 DIAGNOSIS — I2510 Atherosclerotic heart disease of native coronary artery without angina pectoris: Secondary | ICD-10-CM | POA: Diagnosis not present

## 2022-12-03 DIAGNOSIS — E11621 Type 2 diabetes mellitus with foot ulcer: Secondary | ICD-10-CM | POA: Diagnosis not present

## 2022-12-03 DIAGNOSIS — L905 Scar conditions and fibrosis of skin: Secondary | ICD-10-CM | POA: Diagnosis not present

## 2022-12-03 DIAGNOSIS — L089 Local infection of the skin and subcutaneous tissue, unspecified: Secondary | ICD-10-CM | POA: Diagnosis not present

## 2022-12-03 DIAGNOSIS — Z95820 Peripheral vascular angioplasty status with implants and grafts: Secondary | ICD-10-CM | POA: Diagnosis not present

## 2022-12-03 DIAGNOSIS — I498 Other specified cardiac arrhythmias: Secondary | ICD-10-CM | POA: Diagnosis not present

## 2022-12-03 DIAGNOSIS — T8149XA Infection following a procedure, other surgical site, initial encounter: Secondary | ICD-10-CM | POA: Diagnosis not present

## 2022-12-03 DIAGNOSIS — Z79899 Other long term (current) drug therapy: Secondary | ICD-10-CM | POA: Diagnosis not present

## 2022-12-03 DIAGNOSIS — Z950 Presence of cardiac pacemaker: Secondary | ICD-10-CM | POA: Diagnosis not present

## 2022-12-03 DIAGNOSIS — I70229 Atherosclerosis of native arteries of extremities with rest pain, unspecified extremity: Secondary | ICD-10-CM | POA: Diagnosis not present

## 2022-12-03 DIAGNOSIS — I70202 Unspecified atherosclerosis of native arteries of extremities, left leg: Secondary | ICD-10-CM | POA: Diagnosis not present

## 2022-12-03 DIAGNOSIS — R52 Pain, unspecified: Secondary | ICD-10-CM | POA: Diagnosis not present

## 2022-12-03 DIAGNOSIS — I739 Peripheral vascular disease, unspecified: Secondary | ICD-10-CM | POA: Diagnosis not present

## 2022-12-03 DIAGNOSIS — R001 Bradycardia, unspecified: Secondary | ICD-10-CM | POA: Diagnosis not present

## 2022-12-03 DIAGNOSIS — I110 Hypertensive heart disease with heart failure: Secondary | ICD-10-CM | POA: Diagnosis not present

## 2022-12-03 DIAGNOSIS — I70232 Atherosclerosis of native arteries of right leg with ulceration of calf: Secondary | ICD-10-CM | POA: Diagnosis not present

## 2022-12-03 DIAGNOSIS — I10 Essential (primary) hypertension: Secondary | ICD-10-CM | POA: Diagnosis not present

## 2022-12-03 DIAGNOSIS — L97518 Non-pressure chronic ulcer of other part of right foot with other specified severity: Secondary | ICD-10-CM | POA: Diagnosis not present

## 2022-12-03 DIAGNOSIS — I509 Heart failure, unspecified: Secondary | ICD-10-CM | POA: Diagnosis not present

## 2022-12-03 DIAGNOSIS — B965 Pseudomonas (aeruginosa) (mallei) (pseudomallei) as the cause of diseases classified elsewhere: Secondary | ICD-10-CM | POA: Diagnosis not present

## 2022-12-03 DIAGNOSIS — I771 Stricture of artery: Secondary | ICD-10-CM | POA: Diagnosis not present

## 2022-12-03 DIAGNOSIS — T8189XD Other complications of procedures, not elsewhere classified, subsequent encounter: Secondary | ICD-10-CM | POA: Diagnosis not present

## 2022-12-03 DIAGNOSIS — L97218 Non-pressure chronic ulcer of right calf with other specified severity: Secondary | ICD-10-CM | POA: Diagnosis not present

## 2022-12-03 DIAGNOSIS — T8141XA Infection following a procedure, superficial incisional surgical site, initial encounter: Secondary | ICD-10-CM | POA: Diagnosis not present

## 2022-12-03 DIAGNOSIS — Z7901 Long term (current) use of anticoagulants: Secondary | ICD-10-CM | POA: Diagnosis not present

## 2022-12-03 DIAGNOSIS — I70235 Atherosclerosis of native arteries of right leg with ulceration of other part of foot: Secondary | ICD-10-CM | POA: Diagnosis not present

## 2022-12-03 DIAGNOSIS — T8189XA Other complications of procedures, not elsewhere classified, initial encounter: Secondary | ICD-10-CM | POA: Diagnosis not present

## 2022-12-03 DIAGNOSIS — E1151 Type 2 diabetes mellitus with diabetic peripheral angiopathy without gangrene: Secondary | ICD-10-CM | POA: Diagnosis not present

## 2022-12-03 DIAGNOSIS — L97411 Non-pressure chronic ulcer of right heel and midfoot limited to breakdown of skin: Secondary | ICD-10-CM | POA: Diagnosis not present

## 2022-12-03 DIAGNOSIS — I96 Gangrene, not elsewhere classified: Secondary | ICD-10-CM | POA: Diagnosis not present

## 2022-12-03 DIAGNOSIS — E785 Hyperlipidemia, unspecified: Secondary | ICD-10-CM | POA: Diagnosis not present

## 2022-12-03 DIAGNOSIS — Z89612 Acquired absence of left leg above knee: Secondary | ICD-10-CM | POA: Diagnosis not present

## 2022-12-03 DIAGNOSIS — I6523 Occlusion and stenosis of bilateral carotid arteries: Secondary | ICD-10-CM | POA: Diagnosis not present

## 2022-12-03 DIAGNOSIS — Z95828 Presence of other vascular implants and grafts: Secondary | ICD-10-CM | POA: Diagnosis not present

## 2022-12-03 DIAGNOSIS — A498 Other bacterial infections of unspecified site: Secondary | ICD-10-CM | POA: Diagnosis not present

## 2022-12-03 DIAGNOSIS — I441 Atrioventricular block, second degree: Secondary | ICD-10-CM | POA: Diagnosis not present

## 2022-12-03 DIAGNOSIS — Z7902 Long term (current) use of antithrombotics/antiplatelets: Secondary | ICD-10-CM | POA: Diagnosis not present

## 2022-12-03 DIAGNOSIS — Z9861 Coronary angioplasty status: Secondary | ICD-10-CM | POA: Diagnosis not present

## 2022-12-03 DIAGNOSIS — I5032 Chronic diastolic (congestive) heart failure: Secondary | ICD-10-CM | POA: Diagnosis not present

## 2022-12-03 DIAGNOSIS — Z7401 Bed confinement status: Secondary | ICD-10-CM | POA: Diagnosis not present

## 2022-12-03 DIAGNOSIS — I4510 Unspecified right bundle-branch block: Secondary | ICD-10-CM | POA: Diagnosis not present

## 2022-12-21 DIAGNOSIS — Z7401 Bed confinement status: Secondary | ICD-10-CM | POA: Diagnosis not present

## 2022-12-21 DIAGNOSIS — Z950 Presence of cardiac pacemaker: Secondary | ICD-10-CM | POA: Diagnosis not present

## 2022-12-21 DIAGNOSIS — I771 Stricture of artery: Secondary | ICD-10-CM | POA: Diagnosis not present

## 2022-12-21 DIAGNOSIS — I6523 Occlusion and stenosis of bilateral carotid arteries: Secondary | ICD-10-CM | POA: Diagnosis not present

## 2022-12-21 DIAGNOSIS — I70229 Atherosclerosis of native arteries of extremities with rest pain, unspecified extremity: Secondary | ICD-10-CM | POA: Diagnosis not present

## 2022-12-21 DIAGNOSIS — I4510 Unspecified right bundle-branch block: Secondary | ICD-10-CM | POA: Diagnosis not present

## 2022-12-21 DIAGNOSIS — R52 Pain, unspecified: Secondary | ICD-10-CM | POA: Diagnosis not present

## 2022-12-21 DIAGNOSIS — I10 Essential (primary) hypertension: Secondary | ICD-10-CM | POA: Diagnosis not present

## 2022-12-21 DIAGNOSIS — R001 Bradycardia, unspecified: Secondary | ICD-10-CM | POA: Diagnosis not present

## 2022-12-21 DIAGNOSIS — T8189XD Other complications of procedures, not elsewhere classified, subsequent encounter: Secondary | ICD-10-CM | POA: Diagnosis not present

## 2022-12-21 DIAGNOSIS — I739 Peripheral vascular disease, unspecified: Secondary | ICD-10-CM | POA: Diagnosis not present

## 2022-12-21 DIAGNOSIS — I2510 Atherosclerotic heart disease of native coronary artery without angina pectoris: Secondary | ICD-10-CM | POA: Diagnosis not present

## 2022-12-21 DIAGNOSIS — I509 Heart failure, unspecified: Secondary | ICD-10-CM | POA: Diagnosis not present

## 2022-12-21 DIAGNOSIS — E785 Hyperlipidemia, unspecified: Secondary | ICD-10-CM | POA: Diagnosis not present

## 2022-12-21 DIAGNOSIS — I498 Other specified cardiac arrhythmias: Secondary | ICD-10-CM | POA: Diagnosis not present

## 2022-12-24 DIAGNOSIS — Z89512 Acquired absence of left leg below knee: Secondary | ICD-10-CM | POA: Diagnosis not present

## 2022-12-24 DIAGNOSIS — L98499 Non-pressure chronic ulcer of skin of other sites with unspecified severity: Secondary | ICD-10-CM | POA: Diagnosis not present

## 2022-12-27 DIAGNOSIS — I70211 Atherosclerosis of native arteries of extremities with intermittent claudication, right leg: Secondary | ICD-10-CM | POA: Diagnosis not present

## 2022-12-27 DIAGNOSIS — E785 Hyperlipidemia, unspecified: Secondary | ICD-10-CM | POA: Diagnosis not present

## 2022-12-27 DIAGNOSIS — I441 Atrioventricular block, second degree: Secondary | ICD-10-CM | POA: Diagnosis not present

## 2022-12-27 DIAGNOSIS — I5030 Unspecified diastolic (congestive) heart failure: Secondary | ICD-10-CM | POA: Diagnosis not present

## 2022-12-27 DIAGNOSIS — K5900 Constipation, unspecified: Secondary | ICD-10-CM | POA: Diagnosis not present

## 2022-12-27 DIAGNOSIS — K5790 Diverticulosis of intestine, part unspecified, without perforation or abscess without bleeding: Secondary | ICD-10-CM | POA: Diagnosis not present

## 2022-12-27 DIAGNOSIS — D509 Iron deficiency anemia, unspecified: Secondary | ICD-10-CM | POA: Diagnosis not present

## 2022-12-27 DIAGNOSIS — E669 Obesity, unspecified: Secondary | ICD-10-CM | POA: Diagnosis not present

## 2022-12-27 DIAGNOSIS — M47812 Spondylosis without myelopathy or radiculopathy, cervical region: Secondary | ICD-10-CM | POA: Diagnosis not present

## 2022-12-27 DIAGNOSIS — E1151 Type 2 diabetes mellitus with diabetic peripheral angiopathy without gangrene: Secondary | ICD-10-CM | POA: Diagnosis not present

## 2022-12-27 DIAGNOSIS — Z951 Presence of aortocoronary bypass graft: Secondary | ICD-10-CM | POA: Diagnosis not present

## 2022-12-27 DIAGNOSIS — Z7902 Long term (current) use of antithrombotics/antiplatelets: Secondary | ICD-10-CM | POA: Diagnosis not present

## 2022-12-27 DIAGNOSIS — Z7984 Long term (current) use of oral hypoglycemic drugs: Secondary | ICD-10-CM | POA: Diagnosis not present

## 2022-12-27 DIAGNOSIS — I2510 Atherosclerotic heart disease of native coronary artery without angina pectoris: Secondary | ICD-10-CM | POA: Diagnosis not present

## 2022-12-27 DIAGNOSIS — Z8616 Personal history of COVID-19: Secondary | ICD-10-CM | POA: Diagnosis not present

## 2022-12-27 DIAGNOSIS — E1140 Type 2 diabetes mellitus with diabetic neuropathy, unspecified: Secondary | ICD-10-CM | POA: Diagnosis not present

## 2022-12-27 DIAGNOSIS — Z6832 Body mass index (BMI) 32.0-32.9, adult: Secondary | ICD-10-CM | POA: Diagnosis not present

## 2022-12-27 DIAGNOSIS — Z950 Presence of cardiac pacemaker: Secondary | ICD-10-CM | POA: Diagnosis not present

## 2022-12-27 DIAGNOSIS — Z7901 Long term (current) use of anticoagulants: Secondary | ICD-10-CM | POA: Diagnosis not present

## 2022-12-27 DIAGNOSIS — I110 Hypertensive heart disease with heart failure: Secondary | ICD-10-CM | POA: Diagnosis not present

## 2022-12-27 DIAGNOSIS — Z955 Presence of coronary angioplasty implant and graft: Secondary | ICD-10-CM | POA: Diagnosis not present

## 2022-12-27 DIAGNOSIS — Z96611 Presence of right artificial shoulder joint: Secondary | ICD-10-CM | POA: Diagnosis not present

## 2022-12-27 DIAGNOSIS — Z48812 Encounter for surgical aftercare following surgery on the circulatory system: Secondary | ICD-10-CM | POA: Diagnosis not present

## 2022-12-27 DIAGNOSIS — Z89612 Acquired absence of left leg above knee: Secondary | ICD-10-CM | POA: Diagnosis not present

## 2023-01-01 DIAGNOSIS — T8189XA Other complications of procedures, not elsewhere classified, initial encounter: Secondary | ICD-10-CM | POA: Diagnosis not present

## 2023-01-01 DIAGNOSIS — S91302A Unspecified open wound, left foot, initial encounter: Secondary | ICD-10-CM | POA: Diagnosis not present

## 2023-01-01 DIAGNOSIS — S81802A Unspecified open wound, left lower leg, initial encounter: Secondary | ICD-10-CM | POA: Diagnosis not present

## 2023-01-03 DIAGNOSIS — M47812 Spondylosis without myelopathy or radiculopathy, cervical region: Secondary | ICD-10-CM | POA: Diagnosis not present

## 2023-01-03 DIAGNOSIS — Z955 Presence of coronary angioplasty implant and graft: Secondary | ICD-10-CM | POA: Diagnosis not present

## 2023-01-03 DIAGNOSIS — Z7984 Long term (current) use of oral hypoglycemic drugs: Secondary | ICD-10-CM | POA: Diagnosis not present

## 2023-01-03 DIAGNOSIS — I70211 Atherosclerosis of native arteries of extremities with intermittent claudication, right leg: Secondary | ICD-10-CM | POA: Diagnosis not present

## 2023-01-03 DIAGNOSIS — Z8616 Personal history of COVID-19: Secondary | ICD-10-CM | POA: Diagnosis not present

## 2023-01-03 DIAGNOSIS — E785 Hyperlipidemia, unspecified: Secondary | ICD-10-CM | POA: Diagnosis not present

## 2023-01-03 DIAGNOSIS — Z48812 Encounter for surgical aftercare following surgery on the circulatory system: Secondary | ICD-10-CM | POA: Diagnosis not present

## 2023-01-03 DIAGNOSIS — I441 Atrioventricular block, second degree: Secondary | ICD-10-CM | POA: Diagnosis not present

## 2023-01-03 DIAGNOSIS — E1140 Type 2 diabetes mellitus with diabetic neuropathy, unspecified: Secondary | ICD-10-CM | POA: Diagnosis not present

## 2023-01-03 DIAGNOSIS — Z89612 Acquired absence of left leg above knee: Secondary | ICD-10-CM | POA: Diagnosis not present

## 2023-01-03 DIAGNOSIS — D509 Iron deficiency anemia, unspecified: Secondary | ICD-10-CM | POA: Diagnosis not present

## 2023-01-03 DIAGNOSIS — Z6832 Body mass index (BMI) 32.0-32.9, adult: Secondary | ICD-10-CM | POA: Diagnosis not present

## 2023-01-03 DIAGNOSIS — K5790 Diverticulosis of intestine, part unspecified, without perforation or abscess without bleeding: Secondary | ICD-10-CM | POA: Diagnosis not present

## 2023-01-03 DIAGNOSIS — Z951 Presence of aortocoronary bypass graft: Secondary | ICD-10-CM | POA: Diagnosis not present

## 2023-01-03 DIAGNOSIS — Z96611 Presence of right artificial shoulder joint: Secondary | ICD-10-CM | POA: Diagnosis not present

## 2023-01-03 DIAGNOSIS — K5900 Constipation, unspecified: Secondary | ICD-10-CM | POA: Diagnosis not present

## 2023-01-03 DIAGNOSIS — I110 Hypertensive heart disease with heart failure: Secondary | ICD-10-CM | POA: Diagnosis not present

## 2023-01-03 DIAGNOSIS — Z7901 Long term (current) use of anticoagulants: Secondary | ICD-10-CM | POA: Diagnosis not present

## 2023-01-03 DIAGNOSIS — Z7902 Long term (current) use of antithrombotics/antiplatelets: Secondary | ICD-10-CM | POA: Diagnosis not present

## 2023-01-03 DIAGNOSIS — I2510 Atherosclerotic heart disease of native coronary artery without angina pectoris: Secondary | ICD-10-CM | POA: Diagnosis not present

## 2023-01-03 DIAGNOSIS — Z950 Presence of cardiac pacemaker: Secondary | ICD-10-CM | POA: Diagnosis not present

## 2023-01-03 DIAGNOSIS — I5030 Unspecified diastolic (congestive) heart failure: Secondary | ICD-10-CM | POA: Diagnosis not present

## 2023-01-03 DIAGNOSIS — E1151 Type 2 diabetes mellitus with diabetic peripheral angiopathy without gangrene: Secondary | ICD-10-CM | POA: Diagnosis not present

## 2023-01-03 DIAGNOSIS — E669 Obesity, unspecified: Secondary | ICD-10-CM | POA: Diagnosis not present

## 2023-01-04 DIAGNOSIS — S91302A Unspecified open wound, left foot, initial encounter: Secondary | ICD-10-CM | POA: Diagnosis not present

## 2023-01-04 DIAGNOSIS — S81802A Unspecified open wound, left lower leg, initial encounter: Secondary | ICD-10-CM | POA: Diagnosis not present

## 2023-01-04 DIAGNOSIS — T8189XA Other complications of procedures, not elsewhere classified, initial encounter: Secondary | ICD-10-CM | POA: Diagnosis not present

## 2023-01-08 DIAGNOSIS — I2510 Atherosclerotic heart disease of native coronary artery without angina pectoris: Secondary | ICD-10-CM | POA: Diagnosis not present

## 2023-01-08 DIAGNOSIS — K5900 Constipation, unspecified: Secondary | ICD-10-CM | POA: Diagnosis not present

## 2023-01-08 DIAGNOSIS — Z950 Presence of cardiac pacemaker: Secondary | ICD-10-CM | POA: Diagnosis not present

## 2023-01-08 DIAGNOSIS — Z89612 Acquired absence of left leg above knee: Secondary | ICD-10-CM | POA: Diagnosis not present

## 2023-01-08 DIAGNOSIS — Z7902 Long term (current) use of antithrombotics/antiplatelets: Secondary | ICD-10-CM | POA: Diagnosis not present

## 2023-01-08 DIAGNOSIS — E785 Hyperlipidemia, unspecified: Secondary | ICD-10-CM | POA: Diagnosis not present

## 2023-01-08 DIAGNOSIS — Z7901 Long term (current) use of anticoagulants: Secondary | ICD-10-CM | POA: Diagnosis not present

## 2023-01-08 DIAGNOSIS — E669 Obesity, unspecified: Secondary | ICD-10-CM | POA: Diagnosis not present

## 2023-01-08 DIAGNOSIS — Z7984 Long term (current) use of oral hypoglycemic drugs: Secondary | ICD-10-CM | POA: Diagnosis not present

## 2023-01-08 DIAGNOSIS — D509 Iron deficiency anemia, unspecified: Secondary | ICD-10-CM | POA: Diagnosis not present

## 2023-01-08 DIAGNOSIS — Z6832 Body mass index (BMI) 32.0-32.9, adult: Secondary | ICD-10-CM | POA: Diagnosis not present

## 2023-01-08 DIAGNOSIS — I5030 Unspecified diastolic (congestive) heart failure: Secondary | ICD-10-CM | POA: Diagnosis not present

## 2023-01-08 DIAGNOSIS — Z48812 Encounter for surgical aftercare following surgery on the circulatory system: Secondary | ICD-10-CM | POA: Diagnosis not present

## 2023-01-08 DIAGNOSIS — Z951 Presence of aortocoronary bypass graft: Secondary | ICD-10-CM | POA: Diagnosis not present

## 2023-01-08 DIAGNOSIS — M47812 Spondylosis without myelopathy or radiculopathy, cervical region: Secondary | ICD-10-CM | POA: Diagnosis not present

## 2023-01-08 DIAGNOSIS — I110 Hypertensive heart disease with heart failure: Secondary | ICD-10-CM | POA: Diagnosis not present

## 2023-01-08 DIAGNOSIS — I441 Atrioventricular block, second degree: Secondary | ICD-10-CM | POA: Diagnosis not present

## 2023-01-08 DIAGNOSIS — E1140 Type 2 diabetes mellitus with diabetic neuropathy, unspecified: Secondary | ICD-10-CM | POA: Diagnosis not present

## 2023-01-08 DIAGNOSIS — Z8616 Personal history of COVID-19: Secondary | ICD-10-CM | POA: Diagnosis not present

## 2023-01-08 DIAGNOSIS — K5790 Diverticulosis of intestine, part unspecified, without perforation or abscess without bleeding: Secondary | ICD-10-CM | POA: Diagnosis not present

## 2023-01-08 DIAGNOSIS — Z96611 Presence of right artificial shoulder joint: Secondary | ICD-10-CM | POA: Diagnosis not present

## 2023-01-08 DIAGNOSIS — E1151 Type 2 diabetes mellitus with diabetic peripheral angiopathy without gangrene: Secondary | ICD-10-CM | POA: Diagnosis not present

## 2023-01-08 DIAGNOSIS — Z955 Presence of coronary angioplasty implant and graft: Secondary | ICD-10-CM | POA: Diagnosis not present

## 2023-01-08 DIAGNOSIS — I70211 Atherosclerosis of native arteries of extremities with intermittent claudication, right leg: Secondary | ICD-10-CM | POA: Diagnosis not present

## 2023-01-09 DIAGNOSIS — Z950 Presence of cardiac pacemaker: Secondary | ICD-10-CM | POA: Diagnosis not present

## 2023-01-14 DIAGNOSIS — E1122 Type 2 diabetes mellitus with diabetic chronic kidney disease: Secondary | ICD-10-CM | POA: Diagnosis not present

## 2023-01-14 DIAGNOSIS — H3530 Unspecified macular degeneration: Secondary | ICD-10-CM | POA: Diagnosis not present

## 2023-01-14 DIAGNOSIS — F419 Anxiety disorder, unspecified: Secondary | ICD-10-CM | POA: Diagnosis not present

## 2023-01-14 DIAGNOSIS — I480 Paroxysmal atrial fibrillation: Secondary | ICD-10-CM | POA: Diagnosis not present

## 2023-01-14 DIAGNOSIS — J4489 Other specified chronic obstructive pulmonary disease: Secondary | ICD-10-CM | POA: Diagnosis not present

## 2023-01-14 DIAGNOSIS — I130 Hypertensive heart and chronic kidney disease with heart failure and stage 1 through stage 4 chronic kidney disease, or unspecified chronic kidney disease: Secondary | ICD-10-CM | POA: Diagnosis not present

## 2023-01-14 DIAGNOSIS — E871 Hypo-osmolality and hyponatremia: Secondary | ICD-10-CM | POA: Diagnosis not present

## 2023-01-14 DIAGNOSIS — I5042 Chronic combined systolic (congestive) and diastolic (congestive) heart failure: Secondary | ICD-10-CM | POA: Diagnosis not present

## 2023-01-14 DIAGNOSIS — E1142 Type 2 diabetes mellitus with diabetic polyneuropathy: Secondary | ICD-10-CM | POA: Diagnosis not present

## 2023-01-14 DIAGNOSIS — I69320 Aphasia following cerebral infarction: Secondary | ICD-10-CM | POA: Diagnosis not present

## 2023-01-14 DIAGNOSIS — L97912 Non-pressure chronic ulcer of unspecified part of right lower leg with fat layer exposed: Secondary | ICD-10-CM | POA: Diagnosis not present

## 2023-01-14 DIAGNOSIS — T8189XD Other complications of procedures, not elsewhere classified, subsequent encounter: Secondary | ICD-10-CM | POA: Diagnosis not present

## 2023-01-14 DIAGNOSIS — K5900 Constipation, unspecified: Secondary | ICD-10-CM | POA: Diagnosis not present

## 2023-01-14 DIAGNOSIS — I69351 Hemiplegia and hemiparesis following cerebral infarction affecting right dominant side: Secondary | ICD-10-CM | POA: Diagnosis not present

## 2023-01-14 DIAGNOSIS — I272 Pulmonary hypertension, unspecified: Secondary | ICD-10-CM | POA: Diagnosis not present

## 2023-01-14 DIAGNOSIS — F32A Depression, unspecified: Secondary | ICD-10-CM | POA: Diagnosis not present

## 2023-01-14 DIAGNOSIS — I2510 Atherosclerotic heart disease of native coronary artery without angina pectoris: Secondary | ICD-10-CM | POA: Diagnosis not present

## 2023-01-14 DIAGNOSIS — E1151 Type 2 diabetes mellitus with diabetic peripheral angiopathy without gangrene: Secondary | ICD-10-CM | POA: Diagnosis not present

## 2023-01-14 DIAGNOSIS — N1832 Chronic kidney disease, stage 3b: Secondary | ICD-10-CM | POA: Diagnosis not present

## 2023-01-14 DIAGNOSIS — I70238 Atherosclerosis of native arteries of right leg with ulceration of other part of lower right leg: Secondary | ICD-10-CM | POA: Diagnosis not present

## 2023-01-14 DIAGNOSIS — D509 Iron deficiency anemia, unspecified: Secondary | ICD-10-CM | POA: Diagnosis not present

## 2023-01-14 DIAGNOSIS — K219 Gastro-esophageal reflux disease without esophagitis: Secondary | ICD-10-CM | POA: Diagnosis not present

## 2023-01-14 DIAGNOSIS — E7800 Pure hypercholesterolemia, unspecified: Secondary | ICD-10-CM | POA: Diagnosis not present

## 2023-01-14 DIAGNOSIS — I771 Stricture of artery: Secondary | ICD-10-CM | POA: Diagnosis not present

## 2023-01-15 DIAGNOSIS — T8189XA Other complications of procedures, not elsewhere classified, initial encounter: Secondary | ICD-10-CM | POA: Diagnosis not present

## 2023-01-15 DIAGNOSIS — S91302A Unspecified open wound, left foot, initial encounter: Secondary | ICD-10-CM | POA: Diagnosis not present

## 2023-01-15 DIAGNOSIS — S81802A Unspecified open wound, left lower leg, initial encounter: Secondary | ICD-10-CM | POA: Diagnosis not present

## 2023-01-16 DIAGNOSIS — R936 Abnormal findings on diagnostic imaging of limbs: Secondary | ICD-10-CM | POA: Diagnosis not present

## 2023-01-16 DIAGNOSIS — T8189XD Other complications of procedures, not elsewhere classified, subsequent encounter: Secondary | ICD-10-CM | POA: Diagnosis not present

## 2023-01-17 DIAGNOSIS — L7682 Other postprocedural complications of skin and subcutaneous tissue: Secondary | ICD-10-CM | POA: Diagnosis not present

## 2023-01-17 DIAGNOSIS — Z7901 Long term (current) use of anticoagulants: Secondary | ICD-10-CM | POA: Diagnosis not present

## 2023-01-17 DIAGNOSIS — S81801A Unspecified open wound, right lower leg, initial encounter: Secondary | ICD-10-CM | POA: Diagnosis not present

## 2023-01-17 DIAGNOSIS — I69351 Hemiplegia and hemiparesis following cerebral infarction affecting right dominant side: Secondary | ICD-10-CM | POA: Diagnosis not present

## 2023-01-17 DIAGNOSIS — Z89421 Acquired absence of other right toe(s): Secondary | ICD-10-CM | POA: Diagnosis not present

## 2023-01-17 DIAGNOSIS — Z95828 Presence of other vascular implants and grafts: Secondary | ICD-10-CM | POA: Diagnosis not present

## 2023-01-17 DIAGNOSIS — T8141XA Infection following a procedure, superficial incisional surgical site, initial encounter: Secondary | ICD-10-CM | POA: Diagnosis not present

## 2023-01-17 DIAGNOSIS — R791 Abnormal coagulation profile: Secondary | ICD-10-CM | POA: Diagnosis not present

## 2023-01-17 DIAGNOSIS — X58XXXA Exposure to other specified factors, initial encounter: Secondary | ICD-10-CM | POA: Diagnosis not present

## 2023-01-17 DIAGNOSIS — M79661 Pain in right lower leg: Secondary | ICD-10-CM | POA: Diagnosis not present

## 2023-01-17 DIAGNOSIS — I2510 Atherosclerotic heart disease of native coronary artery without angina pectoris: Secondary | ICD-10-CM | POA: Diagnosis not present

## 2023-01-17 DIAGNOSIS — R609 Edema, unspecified: Secondary | ICD-10-CM | POA: Diagnosis not present

## 2023-01-17 DIAGNOSIS — Z9049 Acquired absence of other specified parts of digestive tract: Secondary | ICD-10-CM | POA: Diagnosis not present

## 2023-01-17 DIAGNOSIS — Z9861 Coronary angioplasty status: Secondary | ICD-10-CM | POA: Diagnosis not present

## 2023-01-17 DIAGNOSIS — I130 Hypertensive heart and chronic kidney disease with heart failure and stage 1 through stage 4 chronic kidney disease, or unspecified chronic kidney disease: Secondary | ICD-10-CM | POA: Diagnosis not present

## 2023-01-17 DIAGNOSIS — R936 Abnormal findings on diagnostic imaging of limbs: Secondary | ICD-10-CM | POA: Diagnosis not present

## 2023-01-17 DIAGNOSIS — L97912 Non-pressure chronic ulcer of unspecified part of right lower leg with fat layer exposed: Secondary | ICD-10-CM | POA: Diagnosis not present

## 2023-01-17 DIAGNOSIS — Z9889 Other specified postprocedural states: Secondary | ICD-10-CM | POA: Diagnosis not present

## 2023-01-17 DIAGNOSIS — B965 Pseudomonas (aeruginosa) (mallei) (pseudomallei) as the cause of diseases classified elsewhere: Secondary | ICD-10-CM | POA: Diagnosis not present

## 2023-01-17 DIAGNOSIS — Z96611 Presence of right artificial shoulder joint: Secondary | ICD-10-CM | POA: Diagnosis not present

## 2023-01-17 DIAGNOSIS — I739 Peripheral vascular disease, unspecified: Secondary | ICD-10-CM | POA: Diagnosis not present

## 2023-01-17 DIAGNOSIS — I69320 Aphasia following cerebral infarction: Secondary | ICD-10-CM | POA: Diagnosis not present

## 2023-01-17 DIAGNOSIS — Z8616 Personal history of COVID-19: Secondary | ICD-10-CM | POA: Diagnosis not present

## 2023-01-17 DIAGNOSIS — Z89612 Acquired absence of left leg above knee: Secondary | ICD-10-CM | POA: Diagnosis not present

## 2023-01-17 DIAGNOSIS — Y838 Other surgical procedures as the cause of abnormal reaction of the patient, or of later complication, without mention of misadventure at the time of the procedure: Secondary | ICD-10-CM | POA: Diagnosis not present

## 2023-01-17 DIAGNOSIS — E785 Hyperlipidemia, unspecified: Secondary | ICD-10-CM | POA: Diagnosis not present

## 2023-01-17 DIAGNOSIS — Z7902 Long term (current) use of antithrombotics/antiplatelets: Secondary | ICD-10-CM | POA: Diagnosis not present

## 2023-01-17 DIAGNOSIS — Z1152 Encounter for screening for COVID-19: Secondary | ICD-10-CM | POA: Diagnosis not present

## 2023-01-17 DIAGNOSIS — G8918 Other acute postprocedural pain: Secondary | ICD-10-CM | POA: Diagnosis not present

## 2023-01-17 DIAGNOSIS — I5042 Chronic combined systolic (congestive) and diastolic (congestive) heart failure: Secondary | ICD-10-CM | POA: Diagnosis not present

## 2023-01-17 DIAGNOSIS — Z950 Presence of cardiac pacemaker: Secondary | ICD-10-CM | POA: Diagnosis not present

## 2023-01-17 DIAGNOSIS — T8189XD Other complications of procedures, not elsewhere classified, subsequent encounter: Secondary | ICD-10-CM | POA: Diagnosis not present

## 2023-01-17 DIAGNOSIS — Z87891 Personal history of nicotine dependence: Secondary | ICD-10-CM | POA: Diagnosis not present

## 2023-01-17 DIAGNOSIS — Z91048 Other nonmedicinal substance allergy status: Secondary | ICD-10-CM | POA: Diagnosis not present

## 2023-01-17 DIAGNOSIS — I70238 Atherosclerosis of native arteries of right leg with ulceration of other part of lower right leg: Secondary | ICD-10-CM | POA: Diagnosis not present

## 2023-01-17 DIAGNOSIS — Z7984 Long term (current) use of oral hypoglycemic drugs: Secondary | ICD-10-CM | POA: Diagnosis not present

## 2023-01-17 DIAGNOSIS — E1151 Type 2 diabetes mellitus with diabetic peripheral angiopathy without gangrene: Secondary | ICD-10-CM | POA: Diagnosis not present

## 2023-01-17 DIAGNOSIS — I5032 Chronic diastolic (congestive) heart failure: Secondary | ICD-10-CM | POA: Diagnosis not present

## 2023-01-17 DIAGNOSIS — I110 Hypertensive heart disease with heart failure: Secondary | ICD-10-CM | POA: Diagnosis not present

## 2023-01-17 DIAGNOSIS — T8131XA Disruption of external operation (surgical) wound, not elsewhere classified, initial encounter: Secondary | ICD-10-CM | POA: Diagnosis not present

## 2023-01-17 DIAGNOSIS — Z79899 Other long term (current) drug therapy: Secondary | ICD-10-CM | POA: Diagnosis not present

## 2023-01-17 DIAGNOSIS — Z8719 Personal history of other diseases of the digestive system: Secondary | ICD-10-CM | POA: Diagnosis not present

## 2023-01-21 DIAGNOSIS — I69351 Hemiplegia and hemiparesis following cerebral infarction affecting right dominant side: Secondary | ICD-10-CM | POA: Diagnosis not present

## 2023-01-21 DIAGNOSIS — Z9889 Other specified postprocedural states: Secondary | ICD-10-CM | POA: Diagnosis not present

## 2023-01-21 DIAGNOSIS — I5042 Chronic combined systolic (congestive) and diastolic (congestive) heart failure: Secondary | ICD-10-CM | POA: Diagnosis not present

## 2023-01-21 DIAGNOSIS — L97912 Non-pressure chronic ulcer of unspecified part of right lower leg with fat layer exposed: Secondary | ICD-10-CM | POA: Diagnosis not present

## 2023-01-21 DIAGNOSIS — T8189XD Other complications of procedures, not elsewhere classified, subsequent encounter: Secondary | ICD-10-CM | POA: Diagnosis not present

## 2023-01-21 DIAGNOSIS — I130 Hypertensive heart and chronic kidney disease with heart failure and stage 1 through stage 4 chronic kidney disease, or unspecified chronic kidney disease: Secondary | ICD-10-CM | POA: Diagnosis not present

## 2023-01-21 DIAGNOSIS — I69320 Aphasia following cerebral infarction: Secondary | ICD-10-CM | POA: Diagnosis not present

## 2023-01-21 DIAGNOSIS — I70238 Atherosclerosis of native arteries of right leg with ulceration of other part of lower right leg: Secondary | ICD-10-CM | POA: Diagnosis not present

## 2023-01-21 DIAGNOSIS — I739 Peripheral vascular disease, unspecified: Secondary | ICD-10-CM | POA: Diagnosis not present

## 2023-01-22 DIAGNOSIS — Z95828 Presence of other vascular implants and grafts: Secondary | ICD-10-CM | POA: Diagnosis not present

## 2023-01-22 DIAGNOSIS — I739 Peripheral vascular disease, unspecified: Secondary | ICD-10-CM | POA: Diagnosis not present

## 2023-01-22 DIAGNOSIS — S81801A Unspecified open wound, right lower leg, initial encounter: Secondary | ICD-10-CM | POA: Diagnosis not present

## 2023-01-22 DIAGNOSIS — X58XXXA Exposure to other specified factors, initial encounter: Secondary | ICD-10-CM | POA: Diagnosis not present

## 2023-01-22 DIAGNOSIS — L7682 Other postprocedural complications of skin and subcutaneous tissue: Secondary | ICD-10-CM | POA: Diagnosis not present

## 2023-01-26 DIAGNOSIS — T8189XD Other complications of procedures, not elsewhere classified, subsequent encounter: Secondary | ICD-10-CM | POA: Diagnosis not present

## 2023-01-26 DIAGNOSIS — S81801A Unspecified open wound, right lower leg, initial encounter: Secondary | ICD-10-CM | POA: Diagnosis not present

## 2023-01-26 DIAGNOSIS — I739 Peripheral vascular disease, unspecified: Secondary | ICD-10-CM | POA: Diagnosis not present

## 2023-01-28 ENCOUNTER — Ambulatory Visit: Payer: PPO | Admitting: Physician Assistant

## 2023-01-28 DIAGNOSIS — T8131XA Disruption of external operation (surgical) wound, not elsewhere classified, initial encounter: Secondary | ICD-10-CM | POA: Diagnosis not present

## 2023-01-29 DIAGNOSIS — T8189XD Other complications of procedures, not elsewhere classified, subsequent encounter: Secondary | ICD-10-CM | POA: Diagnosis not present

## 2023-02-03 DIAGNOSIS — I739 Peripheral vascular disease, unspecified: Secondary | ICD-10-CM | POA: Diagnosis not present

## 2023-02-03 DIAGNOSIS — Z89612 Acquired absence of left leg above knee: Secondary | ICD-10-CM | POA: Diagnosis not present

## 2023-02-04 DIAGNOSIS — M549 Dorsalgia, unspecified: Secondary | ICD-10-CM | POA: Diagnosis not present

## 2023-02-04 DIAGNOSIS — D649 Anemia, unspecified: Secondary | ICD-10-CM | POA: Diagnosis not present

## 2023-02-04 DIAGNOSIS — K5900 Constipation, unspecified: Secondary | ICD-10-CM | POA: Diagnosis not present

## 2023-02-04 DIAGNOSIS — Z7409 Other reduced mobility: Secondary | ICD-10-CM | POA: Diagnosis not present

## 2023-02-04 DIAGNOSIS — Z7984 Long term (current) use of oral hypoglycemic drugs: Secondary | ICD-10-CM | POA: Diagnosis not present

## 2023-02-04 DIAGNOSIS — Z9861 Coronary angioplasty status: Secondary | ICD-10-CM | POA: Diagnosis not present

## 2023-02-04 DIAGNOSIS — Z8701 Personal history of pneumonia (recurrent): Secondary | ICD-10-CM | POA: Diagnosis not present

## 2023-02-04 DIAGNOSIS — E1151 Type 2 diabetes mellitus with diabetic peripheral angiopathy without gangrene: Secondary | ICD-10-CM | POA: Diagnosis not present

## 2023-02-04 DIAGNOSIS — Z7401 Bed confinement status: Secondary | ICD-10-CM | POA: Diagnosis not present

## 2023-02-04 DIAGNOSIS — I2510 Atherosclerotic heart disease of native coronary artery without angina pectoris: Secondary | ICD-10-CM | POA: Diagnosis not present

## 2023-02-04 DIAGNOSIS — I441 Atrioventricular block, second degree: Secondary | ICD-10-CM | POA: Diagnosis not present

## 2023-02-04 DIAGNOSIS — Z90710 Acquired absence of both cervix and uterus: Secondary | ICD-10-CM | POA: Diagnosis not present

## 2023-02-04 DIAGNOSIS — M6281 Muscle weakness (generalized): Secondary | ICD-10-CM | POA: Diagnosis not present

## 2023-02-04 DIAGNOSIS — R531 Weakness: Secondary | ICD-10-CM | POA: Diagnosis not present

## 2023-02-04 DIAGNOSIS — Z9049 Acquired absence of other specified parts of digestive tract: Secondary | ICD-10-CM | POA: Diagnosis not present

## 2023-02-04 DIAGNOSIS — Z96611 Presence of right artificial shoulder joint: Secondary | ICD-10-CM | POA: Diagnosis not present

## 2023-02-04 DIAGNOSIS — Z8616 Personal history of COVID-19: Secondary | ICD-10-CM | POA: Diagnosis not present

## 2023-02-04 DIAGNOSIS — I110 Hypertensive heart disease with heart failure: Secondary | ICD-10-CM | POA: Diagnosis not present

## 2023-02-04 DIAGNOSIS — I4510 Unspecified right bundle-branch block: Secondary | ICD-10-CM | POA: Diagnosis not present

## 2023-02-04 DIAGNOSIS — G4700 Insomnia, unspecified: Secondary | ICD-10-CM | POA: Diagnosis not present

## 2023-02-04 DIAGNOSIS — R5381 Other malaise: Secondary | ICD-10-CM | POA: Diagnosis not present

## 2023-02-04 DIAGNOSIS — I5042 Chronic combined systolic (congestive) and diastolic (congestive) heart failure: Secondary | ICD-10-CM | POA: Diagnosis not present

## 2023-02-04 DIAGNOSIS — E559 Vitamin D deficiency, unspecified: Secondary | ICD-10-CM | POA: Diagnosis not present

## 2023-02-04 DIAGNOSIS — E785 Hyperlipidemia, unspecified: Secondary | ICD-10-CM | POA: Diagnosis not present

## 2023-02-04 DIAGNOSIS — Z950 Presence of cardiac pacemaker: Secondary | ICD-10-CM | POA: Diagnosis not present

## 2023-02-04 DIAGNOSIS — Z48812 Encounter for surgical aftercare following surgery on the circulatory system: Secondary | ICD-10-CM | POA: Diagnosis not present

## 2023-02-04 DIAGNOSIS — Z89612 Acquired absence of left leg above knee: Secondary | ICD-10-CM | POA: Diagnosis not present

## 2023-02-26 DIAGNOSIS — Z0189 Encounter for other specified special examinations: Secondary | ICD-10-CM | POA: Diagnosis not present

## 2023-02-26 DIAGNOSIS — Z945 Skin transplant status: Secondary | ICD-10-CM | POA: Diagnosis not present

## 2023-03-23 DIAGNOSIS — I5042 Chronic combined systolic (congestive) and diastolic (congestive) heart failure: Secondary | ICD-10-CM | POA: Diagnosis not present

## 2023-03-23 DIAGNOSIS — I70219 Atherosclerosis of native arteries of extremities with intermittent claudication, unspecified extremity: Secondary | ICD-10-CM | POA: Diagnosis not present

## 2023-03-23 DIAGNOSIS — I130 Hypertensive heart and chronic kidney disease with heart failure and stage 1 through stage 4 chronic kidney disease, or unspecified chronic kidney disease: Secondary | ICD-10-CM | POA: Diagnosis not present

## 2023-03-23 DIAGNOSIS — I69351 Hemiplegia and hemiparesis following cerebral infarction affecting right dominant side: Secondary | ICD-10-CM | POA: Diagnosis not present

## 2023-03-23 DIAGNOSIS — I69320 Aphasia following cerebral infarction: Secondary | ICD-10-CM | POA: Diagnosis not present

## 2023-03-23 DIAGNOSIS — E1151 Type 2 diabetes mellitus with diabetic peripheral angiopathy without gangrene: Secondary | ICD-10-CM | POA: Diagnosis not present

## 2023-03-23 DIAGNOSIS — T8189XD Other complications of procedures, not elsewhere classified, subsequent encounter: Secondary | ICD-10-CM | POA: Diagnosis not present

## 2023-04-02 DIAGNOSIS — D692 Other nonthrombocytopenic purpura: Secondary | ICD-10-CM | POA: Diagnosis not present

## 2023-04-02 DIAGNOSIS — I110 Hypertensive heart disease with heart failure: Secondary | ICD-10-CM | POA: Diagnosis not present

## 2023-04-02 DIAGNOSIS — E1151 Type 2 diabetes mellitus with diabetic peripheral angiopathy without gangrene: Secondary | ICD-10-CM | POA: Diagnosis not present

## 2023-04-02 DIAGNOSIS — Z89612 Acquired absence of left leg above knee: Secondary | ICD-10-CM | POA: Diagnosis not present

## 2023-04-02 DIAGNOSIS — Z515 Encounter for palliative care: Secondary | ICD-10-CM | POA: Diagnosis not present

## 2023-04-02 DIAGNOSIS — I509 Heart failure, unspecified: Secondary | ICD-10-CM | POA: Diagnosis not present

## 2023-04-02 DIAGNOSIS — F325 Major depressive disorder, single episode, in full remission: Secondary | ICD-10-CM | POA: Diagnosis not present

## 2023-04-02 DIAGNOSIS — Z7984 Long term (current) use of oral hypoglycemic drugs: Secondary | ICD-10-CM | POA: Diagnosis not present

## 2023-04-04 DIAGNOSIS — I779 Disorder of arteries and arterioles, unspecified: Secondary | ICD-10-CM | POA: Diagnosis not present

## 2023-04-04 DIAGNOSIS — Z95828 Presence of other vascular implants and grafts: Secondary | ICD-10-CM | POA: Diagnosis not present

## 2023-04-10 DIAGNOSIS — Z45018 Encounter for adjustment and management of other part of cardiac pacemaker: Secondary | ICD-10-CM | POA: Diagnosis not present

## 2023-04-10 DIAGNOSIS — I2510 Atherosclerotic heart disease of native coronary artery without angina pectoris: Secondary | ICD-10-CM | POA: Diagnosis not present

## 2023-04-10 DIAGNOSIS — Z950 Presence of cardiac pacemaker: Secondary | ICD-10-CM | POA: Diagnosis not present

## 2023-04-10 DIAGNOSIS — I4510 Unspecified right bundle-branch block: Secondary | ICD-10-CM | POA: Diagnosis not present

## 2023-04-10 DIAGNOSIS — I10 Essential (primary) hypertension: Secondary | ICD-10-CM | POA: Diagnosis not present

## 2023-04-10 DIAGNOSIS — Z8679 Personal history of other diseases of the circulatory system: Secondary | ICD-10-CM | POA: Diagnosis not present

## 2023-04-10 DIAGNOSIS — R001 Bradycardia, unspecified: Secondary | ICD-10-CM | POA: Diagnosis not present

## 2023-04-10 DIAGNOSIS — I498 Other specified cardiac arrhythmias: Secondary | ICD-10-CM | POA: Diagnosis not present

## 2023-04-16 DIAGNOSIS — I050 Rheumatic mitral stenosis: Secondary | ICD-10-CM | POA: Diagnosis not present

## 2023-04-16 DIAGNOSIS — H43393 Other vitreous opacities, bilateral: Secondary | ICD-10-CM | POA: Diagnosis not present

## 2023-04-16 DIAGNOSIS — E118 Type 2 diabetes mellitus with unspecified complications: Secondary | ICD-10-CM | POA: Diagnosis not present

## 2023-04-16 DIAGNOSIS — E119 Type 2 diabetes mellitus without complications: Secondary | ICD-10-CM | POA: Diagnosis not present

## 2023-04-16 DIAGNOSIS — H2513 Age-related nuclear cataract, bilateral: Secondary | ICD-10-CM | POA: Diagnosis not present

## 2023-04-16 DIAGNOSIS — I498 Other specified cardiac arrhythmias: Secondary | ICD-10-CM | POA: Diagnosis not present

## 2023-04-16 DIAGNOSIS — H40003 Preglaucoma, unspecified, bilateral: Secondary | ICD-10-CM | POA: Diagnosis not present

## 2023-04-16 DIAGNOSIS — Z01818 Encounter for other preprocedural examination: Secondary | ICD-10-CM | POA: Diagnosis not present

## 2023-04-16 DIAGNOSIS — I771 Stricture of artery: Secondary | ICD-10-CM | POA: Diagnosis not present

## 2023-04-16 DIAGNOSIS — D509 Iron deficiency anemia, unspecified: Secondary | ICD-10-CM | POA: Diagnosis not present

## 2023-04-16 DIAGNOSIS — I779 Disorder of arteries and arterioles, unspecified: Secondary | ICD-10-CM | POA: Diagnosis not present

## 2023-04-16 DIAGNOSIS — I70229 Atherosclerosis of native arteries of extremities with rest pain, unspecified extremity: Secondary | ICD-10-CM | POA: Diagnosis not present

## 2023-04-16 DIAGNOSIS — I350 Nonrheumatic aortic (valve) stenosis: Secondary | ICD-10-CM | POA: Diagnosis not present

## 2023-04-16 DIAGNOSIS — Z950 Presence of cardiac pacemaker: Secondary | ICD-10-CM | POA: Diagnosis not present

## 2023-04-16 DIAGNOSIS — I10 Essential (primary) hypertension: Secondary | ICD-10-CM | POA: Diagnosis not present

## 2023-04-16 DIAGNOSIS — I2510 Atherosclerotic heart disease of native coronary artery without angina pectoris: Secondary | ICD-10-CM | POA: Diagnosis not present

## 2023-04-16 LAB — HM DIABETES EYE EXAM

## 2023-04-24 DIAGNOSIS — Z87891 Personal history of nicotine dependence: Secondary | ICD-10-CM | POA: Diagnosis not present

## 2023-04-24 DIAGNOSIS — Z7902 Long term (current) use of antithrombotics/antiplatelets: Secondary | ICD-10-CM | POA: Diagnosis not present

## 2023-04-24 DIAGNOSIS — E1151 Type 2 diabetes mellitus with diabetic peripheral angiopathy without gangrene: Secondary | ICD-10-CM | POA: Diagnosis not present

## 2023-04-24 DIAGNOSIS — Z7901 Long term (current) use of anticoagulants: Secondary | ICD-10-CM | POA: Diagnosis not present

## 2023-04-24 DIAGNOSIS — Z955 Presence of coronary angioplasty implant and graft: Secondary | ICD-10-CM | POA: Diagnosis not present

## 2023-04-24 DIAGNOSIS — I739 Peripheral vascular disease, unspecified: Secondary | ICD-10-CM | POA: Diagnosis not present

## 2023-04-24 DIAGNOSIS — Z79899 Other long term (current) drug therapy: Secondary | ICD-10-CM | POA: Diagnosis not present

## 2023-04-24 DIAGNOSIS — T82858A Stenosis of vascular prosthetic devices, implants and grafts, initial encounter: Secondary | ICD-10-CM | POA: Diagnosis not present

## 2023-04-24 DIAGNOSIS — Z89612 Acquired absence of left leg above knee: Secondary | ICD-10-CM | POA: Diagnosis not present

## 2023-04-24 DIAGNOSIS — I6523 Occlusion and stenosis of bilateral carotid arteries: Secondary | ICD-10-CM | POA: Diagnosis not present

## 2023-04-24 DIAGNOSIS — I70201 Unspecified atherosclerosis of native arteries of extremities, right leg: Secondary | ICD-10-CM | POA: Diagnosis not present

## 2023-04-24 DIAGNOSIS — I2510 Atherosclerotic heart disease of native coronary artery without angina pectoris: Secondary | ICD-10-CM | POA: Diagnosis not present

## 2023-04-24 DIAGNOSIS — D509 Iron deficiency anemia, unspecified: Secondary | ICD-10-CM | POA: Diagnosis not present

## 2023-04-24 DIAGNOSIS — I10 Essential (primary) hypertension: Secondary | ICD-10-CM | POA: Diagnosis not present

## 2023-04-24 DIAGNOSIS — E1169 Type 2 diabetes mellitus with other specified complication: Secondary | ICD-10-CM | POA: Diagnosis not present

## 2023-04-24 DIAGNOSIS — Z950 Presence of cardiac pacemaker: Secondary | ICD-10-CM | POA: Diagnosis not present

## 2023-04-26 DIAGNOSIS — H40003 Preglaucoma, unspecified, bilateral: Secondary | ICD-10-CM | POA: Diagnosis not present

## 2023-05-08 DIAGNOSIS — I4510 Unspecified right bundle-branch block: Secondary | ICD-10-CM | POA: Diagnosis not present

## 2023-05-08 DIAGNOSIS — I442 Atrioventricular block, complete: Secondary | ICD-10-CM | POA: Diagnosis not present

## 2023-05-08 DIAGNOSIS — R001 Bradycardia, unspecified: Secondary | ICD-10-CM | POA: Diagnosis not present

## 2023-05-08 DIAGNOSIS — I350 Nonrheumatic aortic (valve) stenosis: Secondary | ICD-10-CM | POA: Diagnosis not present

## 2023-05-08 DIAGNOSIS — Z45018 Encounter for adjustment and management of other part of cardiac pacemaker: Secondary | ICD-10-CM | POA: Diagnosis not present

## 2023-05-08 DIAGNOSIS — I050 Rheumatic mitral stenosis: Secondary | ICD-10-CM | POA: Diagnosis not present

## 2023-05-08 DIAGNOSIS — Z8679 Personal history of other diseases of the circulatory system: Secondary | ICD-10-CM | POA: Diagnosis not present

## 2023-05-08 DIAGNOSIS — I2510 Atherosclerotic heart disease of native coronary artery without angina pectoris: Secondary | ICD-10-CM | POA: Diagnosis not present

## 2023-05-08 DIAGNOSIS — I10 Essential (primary) hypertension: Secondary | ICD-10-CM | POA: Diagnosis not present

## 2023-05-08 DIAGNOSIS — I498 Other specified cardiac arrhythmias: Secondary | ICD-10-CM | POA: Diagnosis not present

## 2023-05-08 DIAGNOSIS — Z950 Presence of cardiac pacemaker: Secondary | ICD-10-CM | POA: Diagnosis not present

## 2023-05-14 ENCOUNTER — Encounter: Payer: Self-pay | Admitting: Internal Medicine

## 2023-05-14 ENCOUNTER — Ambulatory Visit (INDEPENDENT_AMBULATORY_CARE_PROVIDER_SITE_OTHER): Payer: PPO | Admitting: Internal Medicine

## 2023-05-14 VITALS — BP 128/64 | HR 96 | Ht 59.0 in | Wt 145.0 lb

## 2023-05-14 DIAGNOSIS — E118 Type 2 diabetes mellitus with unspecified complications: Secondary | ICD-10-CM

## 2023-05-14 DIAGNOSIS — E049 Nontoxic goiter, unspecified: Secondary | ICD-10-CM

## 2023-05-14 DIAGNOSIS — E1169 Type 2 diabetes mellitus with other specified complication: Secondary | ICD-10-CM | POA: Diagnosis not present

## 2023-05-14 DIAGNOSIS — E785 Hyperlipidemia, unspecified: Secondary | ICD-10-CM | POA: Diagnosis not present

## 2023-05-14 DIAGNOSIS — E119 Type 2 diabetes mellitus without complications: Secondary | ICD-10-CM | POA: Diagnosis not present

## 2023-05-14 DIAGNOSIS — Z7984 Long term (current) use of oral hypoglycemic drugs: Secondary | ICD-10-CM | POA: Diagnosis not present

## 2023-05-14 DIAGNOSIS — I771 Stricture of artery: Secondary | ICD-10-CM | POA: Diagnosis not present

## 2023-05-14 DIAGNOSIS — Z89612 Acquired absence of left leg above knee: Secondary | ICD-10-CM

## 2023-05-14 DIAGNOSIS — I10 Essential (primary) hypertension: Secondary | ICD-10-CM

## 2023-05-14 DIAGNOSIS — Z1231 Encounter for screening mammogram for malignant neoplasm of breast: Secondary | ICD-10-CM

## 2023-05-14 MED ORDER — FENOFIBRATE 145 MG PO TABS
145.0000 mg | ORAL_TABLET | Freq: Every day | ORAL | 1 refills | Status: DC
Start: 2023-05-14 — End: 2023-12-11

## 2023-05-14 MED ORDER — ATORVASTATIN CALCIUM 20 MG PO TABS
40.0000 mg | ORAL_TABLET | Freq: Every day | ORAL | 1 refills | Status: DC
Start: 2023-05-14 — End: 2023-06-13

## 2023-05-14 MED ORDER — CLOPIDOGREL BISULFATE 75 MG PO TABS
75.0000 mg | ORAL_TABLET | Freq: Every day | ORAL | 1 refills | Status: DC
Start: 2023-05-14 — End: 2023-12-11

## 2023-05-14 MED ORDER — FREESTYLE LIBRE 3 SENSOR MISC
1.0000 | 5 refills | Status: DC
Start: 2023-05-14 — End: 2023-11-18

## 2023-05-14 MED ORDER — GLIMEPIRIDE 4 MG PO TABS
4.0000 mg | ORAL_TABLET | Freq: Every day | ORAL | 1 refills | Status: DC
Start: 2023-05-14 — End: 2023-12-16

## 2023-05-14 MED ORDER — EMPAGLIFLOZIN 25 MG PO TABS
25.0000 mg | ORAL_TABLET | Freq: Every day | ORAL | 1 refills | Status: DC
Start: 2023-05-14 — End: 2023-09-04

## 2023-05-14 MED ORDER — DULOXETINE HCL 30 MG PO CPEP: 30.0000 mg | ORAL_CAPSULE | Freq: Every day | ORAL | 1 refills | Status: DC

## 2023-05-14 NOTE — Assessment & Plan Note (Signed)
Normal exam with stable BP on coreg, aldactone and lasix Losartan was discontinued recently by cardiology. No concerns or side effects to current medication. No change in regimen; continue low sodium diet.

## 2023-05-14 NOTE — Assessment & Plan Note (Addendum)
LDL is No results found for: "LDLCALC" Currently being treated with atorvastatin and fenofibrate with good compliance and no concerns.

## 2023-05-14 NOTE — Assessment & Plan Note (Signed)
Blood sugars stable without hypoglycemic symptoms or events. Current regimen is glimepiride and jardiance. Changes made last visit are none. Lab Results  Component Value Date   HGBA1C 5.9 (H) 10/07/2019  BS range 70-150

## 2023-05-14 NOTE — Assessment & Plan Note (Signed)
Seen on recent CTA. She has some hair thinning but no other symptoms Will check labs

## 2023-05-14 NOTE — Progress Notes (Signed)
Date:  05/14/2023   Name:  Christina Blackburn   DOB:  1945/11/03   MRN:  [id number]   Chief Complaint: New Patient (Initial Visit), itchy eyes (Month of itchy eyes, and thinning hair. Wants thyroid checked. ), and Joint Pain (Bilateral wrist pain. Hx of carpel tunnel. Bilateral elbow pain. )  Hypertension This is a chronic problem. The problem is controlled. Pertinent negatives include no chest pain, headaches or shortness of breath. Past treatments include beta blockers and diuretics. The current treatment provides significant improvement. Hypertensive end-organ damage includes CAD/MI and PVD. There is no history of kidney disease, CVA or retinopathy.  Diabetes She presents for her follow-up diabetic visit. She has type 2 diabetes mellitus. Her disease course has been stable. Pertinent negatives for hypoglycemia include no dizziness, headaches or nervousness/anxiousness. Pertinent negatives for diabetes include no chest pain and no fatigue. Diabetic complications include PVD. Pertinent negatives for diabetic complications include no CVA or retinopathy. Current diabetic treatment includes oral agent (dual therapy). An ACE inhibitor/angiotensin II receptor blocker is not being taken. Eye exam is current.  Hyperlipidemia This is a chronic problem. The problem is controlled. Pertinent negatives include no chest pain or shortness of breath. Current antihyperlipidemic treatment includes statins and fibric acid derivatives.    Lab Results  Component Value Date   NA 136 10/17/2019   K 4.0 10/17/2019   CO2 29 10/17/2019   GLUCOSE 126 (H) 10/17/2019   BUN 29 (H) 10/17/2019   CREATININE 0.80 09/10/2022   CALCIUM 8.9 10/17/2019   GFRNONAA >60 10/17/2019   No results found for: "CHOL", "HDL", "LDLCALC", "LDLDIRECT", "TRIG", "CHOLHDL" No results found for: "TSH" Lab Results  Component Value Date   HGBA1C 5.9 (H) 10/07/2019   Lab Results  Component Value Date   WBC 7.6 10/17/2019   HGB 11.9  (L) 10/17/2019   HCT 37.5 10/17/2019   MCV 86.4 10/17/2019   PLT 410 (H) 10/17/2019   Lab Results  Component Value Date   ALT 19 10/17/2019   AST 18 10/17/2019   ALKPHOS 45 10/17/2019   BILITOT 0.7 10/17/2019   No results found for: "25OHVITD2", "25OHVITD3", "VD25OH"   Review of Systems  Constitutional:  Negative for chills, fatigue and fever.  HENT:  Negative for trouble swallowing.   Respiratory:  Negative for chest tightness and shortness of breath.   Cardiovascular:  Negative for chest pain.  Gastrointestinal:  Negative for abdominal pain, constipation and diarrhea.  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  Neurological:  Negative for dizziness, light-headedness and headaches.  Psychiatric/Behavioral:  Positive for sleep disturbance (improved with Cymbalta). Negative for dysphoric mood. The patient is not nervous/anxious.     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   Rotator cuff tendinitis, left 03/26/2018   Spondylosis of cervical region without myelopathy or radiculopathy 03/26/2018   PAD (peripheral artery disease) (HCC) 05/16/2017   Non-insulin treated type 2 diabetes mellitus (HCC) 05/16/2017   Essential hypertension 05/16/2017   Hyperlipidemia associated with type 2 diabetes mellitus (HCC) 05/16/2017   Hx of cardiac cath  05/16/2015   ASCVD (arteriosclerotic cardiovascular disease) 03/25/2014   S/P TAH-BSO 03/25/2014   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    Allergies  Allergen Reactions    Tape Other (See Comments)    Hypoallergenic tape - blisters Blisters    Past Surgical History:  Procedure Laterality Date   ABDOMINAL HYSTERECTOMY  1989   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   APPLICATION OF WOUND VAC Bilateral 12/02/2018   Procedure: APPLICATION OF WOUND VAC;  Surgeon: Renford Dills, MD;  Location: [facility] OR[facility];  Service: Vascular;  Laterality: Bilateral;   BREAST BIOPSY Left 2014   neg   BYPASS GRAFTING FEMORAL/ANTERIOR TIBIAL W/VEIN Right 11/07/2022   CARDIAC CATHETERIZATION     cornary stent   CESAREAN SECTION  1971, 1975   CHOLECYSTECTOMY  1987   COLONOSCOPY WITH PROPOFOL N/A 09/22/2018   Procedure: COLONOSCOPY WITH PROPOFOL;  Surgeon: Christena Deem, MD;  Location: [facility];  Service: Endoscopy;  Laterality: N/A;   COLONOSCOPY WITH PROPOFOL N/A 12/15/2021   Procedure: COLONOSCOPY WITH PROPOFOL;  Surgeon: Regis Bill, MD;  Location: [facility] ENDOSCOPY;  Service: Endoscopy;  Laterality: N/A;  DM, WHEELCHAIR   CORONARY ANGIOPLASTY  2008   stent x1   ENDARTERECTOMY FEMORAL Bilateral 11/12/2018   Procedure: ENDARTERECTOMY FEMORAL;  Surgeon: Renford Dills, MD;  Location: [facility];  Service: Vascular;  Laterality: Bilateral;   INSERTION OF ILIAC STENT Bilateral 11/12/2018   Procedure: INSERTION OF ILIAC STENT;  Surgeon: Renford Dills, MD;  Location: [facility];  Service: Vascular;  Laterality: Bilateral;   LOWER EXTREMITY ANGIOGRAPHY Left 08/05/2018   Procedure: LOWER EXTREMITY ANGIOGRAPHY;  Surgeon: Renford Dills, MD;  Location: [facility];  Service: Cardiovascular;  Laterality: Left;   REVERSE SHOULDER ARTHROPLASTY Right 04/08/2018   Procedure: REVERSE SHOULDER ARTHROPLASTY;  Surgeon: Christena Flake, MD;  Location: [facility];  Service: Orthopedics;  Laterality: Right;   TUBAL LIGATION     WOUND DEBRIDEMENT Bilateral 12/02/2018   Procedure: DEBRIDEMENT WOUND;  Surgeon: Renford Dills, MD;  Location: [facility];   Service: Vascular;  Laterality: Bilateral;    Social History   Tobacco Use   Smoking status: Former    Current packs/day: 0.00    Types: Cigarettes    Start date: 2002    Quit date: 2008    Years since quitting: 16.5   Smokeless tobacco: Never  Vaping Use   Vaping status: Never Used  Substance Use Topics   Alcohol use: Yes    Comment: rare   Drug use: No     Medication list has been reviewed and updated.  Current Meds  Medication Sig   acetaminophen (TYLENOL) 325 MG tablet Take 2 tablets (650 mg total) by mouth every 6 (six) hours as needed for mild pain or headache (fever >/= 101).   Calcium Carbonate-Vitamin D (CALCIUM 600+D PO) Take 1 tablet by mouth 2 (two) times daily.   carvedilol (COREG) 3.125 MG tablet Take 3.125 mg by mouth 2 (two) times daily with a meal.   cholecalciferol (VITAMIN D) 25 MCG (1000 UT) tablet Take 1 tablet (1,000 Units total) by mouth daily.   Continuous Glucose Sensor (FREESTYLE LIBRE 3 SENSOR) MISC 1 each by Does not apply route continuous. Place 1 sensor on the skin every 14 days. Use to check glucose continuously   docusate sodium (COLACE) 100 MG capsule Take 100 mg by mouth daily.   empagliflozin (JARDIANCE) 25 MG TABS tablet Take 1 tablet (25 mg total) by mouth  daily before breakfast.   ferrous sulfate 324 MG TBEC Take 324 mg by mouth.   furosemide (LASIX) 20 MG tablet Take 20 mg by mouth daily as needed for fluid.    magnesium oxide (MAG-OX) 400 MG tablet Take 400 mg by mouth daily.   Multiple Vitamin (MULTIVITAMIN WITH MINERALS) TABS tablet Take 1 tablet by mouth daily.   rivaroxaban (XARELTO) 20 MG TABS tablet Take 20 mg by mouth daily with supper.   senna-docusate (SENOKOT-S) 8.6-50 MG tablet Take 1 tablet by mouth at bedtime as needed for mild constipation.   spironolactone (ALDACTONE) 25 MG tablet Take 12.5 mg by mouth daily.   vitamin B-12 (CYANOCOBALAMIN) 1000 MCG tablet Take 1,000 mcg by mouth daily.   vitamin E 400 UNIT capsule Take  400 Units by mouth daily.    zinc sulfate 220 (50 Zn) MG capsule Take 1 capsule (220 mg total) by mouth daily.   [DISCONTINUED] atorvastatin (LIPITOR) 20 MG tablet Take 40 mg by mouth daily.   [DISCONTINUED] clopidogrel (PLAVIX) 75 MG tablet Take 1 tablet (75 mg total) by mouth daily.   [DISCONTINUED] DULoxetine (CYMBALTA) 30 MG capsule Take 1 capsule (30 mg total) by mouth daily.   [DISCONTINUED] fenofibrate (TRICOR) 145 MG tablet Take 145 mg by mouth daily.       05/14/2023    2:57 PM  GAD 7 : Generalized Anxiety Score  Nervous, Anxious, on Edge 0  Control/stop worrying 0  Worry too much - different things 0  Trouble relaxing 0  Restless 0  Easily annoyed or irritable 0  Afraid - awful might happen 0  Total GAD 7 Score 0  Anxiety Difficulty Not difficult at all       05/14/2023    2:57 PM  Depression screen PHQ 2/9  Decreased Interest 0  Down, Depressed, Hopeless 0  PHQ - 2 Score 0  Altered sleeping 0  Tired, decreased energy 0  Change in appetite 0  Feeling bad or failure about yourself  0  Trouble concentrating 0  Moving slowly or fidgety/restless 0  Suicidal thoughts 0  PHQ-9 Score 0  Difficult doing work/chores Not difficult at all    BP Readings from Last 3 Encounters:  05/14/23 128/64  12/15/21 (!) 159/74  10/17/19 (!) 132/54    Physical Exam Constitutional:      Appearance: Normal appearance.  Neck:     Vascular: No carotid bruit.  Cardiovascular:     Rate and Rhythm: Normal rate and regular rhythm.     Pulses: Normal pulses.     Heart sounds: No murmur heard. Pulmonary:     Effort: Pulmonary effort is normal.     Breath sounds: No wheezing or rhonchi.  Abdominal:     Palpations: Abdomen is soft.     Tenderness: There is no abdominal tenderness.  Musculoskeletal:     Cervical back: Normal range of motion.     Right lower leg: No edema (skin graft healed medial calf).  Lymphadenopathy:     Cervical: No cervical adenopathy.  Skin:    General:  Skin is warm and dry.  Neurological:     General: No focal deficit present.     Mental Status: She is alert and oriented to person, place, and time.     Wt Readings from Last 3 Encounters:  05/14/23 145 lb (65.8 kg)  12/15/21 160 lb (72.6 kg)  10/09/19 164 lb 7.4 oz (74.6 kg)    BP 128/64   Pulse 96  Ht 4\' 11"  (1.499 m)   Wt 145 lb (65.8 kg)   SpO2 98%   BMI 29.29 kg/m   Assessment and Plan:  Problem List Items Addressed This Visit       Unprioritized   Thyroid goiter    Seen on recent CTA. She has some hair thinning but no other symptoms Will check labs      Relevant Medications   carvedilol (COREG) 3.125 MG tablet   Other Relevant Orders   TSH + free T4   Non-insulin treated type 2 diabetes mellitus (HCC) - Primary    Blood sugars stable without hypoglycemic symptoms or events. Current regimen is glimepiride and jardiance. Changes made last visit are none. Lab Results  Component Value Date   HGBA1C 5.9 (H) 10/07/2019  BS range 70-150       Relevant Medications   Continuous Glucose Sensor (FREESTYLE LIBRE 3 SENSOR) MISC   atorvastatin (LIPITOR) 20 MG tablet   glimepiride (AMARYL) 4 MG tablet   empagliflozin (JARDIANCE) 25 MG TABS tablet   Hyperlipidemia associated with type 2 diabetes mellitus (HCC)    LDL is No results found for: "LDLCALC" Currently being treated with atorvastatin and fenofibrate with good compliance and no concerns.       Relevant Medications   carvedilol (COREG) 3.125 MG tablet   rivaroxaban (XARELTO) 20 MG TABS tablet   atorvastatin (LIPITOR) 20 MG tablet   fenofibrate (TRICOR) 145 MG tablet   glimepiride (AMARYL) 4 MG tablet   empagliflozin (JARDIANCE) 25 MG TABS tablet   Other Relevant Orders   Lipid panel   Essential hypertension    Normal exam with stable BP on coreg, aldactone and lasix Losartan was discontinued recently by cardiology. No concerns or side effects to current medication. No change in regimen; continue  low sodium diet.       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    Reubin Milan, MD [facility] [place]

## 2023-05-14 NOTE — Patient Instructions (Signed)
Call [facility] to schedule your mammogram at [phone].  

## 2023-05-21 ENCOUNTER — Encounter: Payer: Self-pay | Admitting: Internal Medicine

## 2023-05-22 ENCOUNTER — Other Ambulatory Visit: Payer: Self-pay | Admitting: Internal Medicine

## 2023-05-22 NOTE — Telephone Encounter (Signed)
Please review.  KP

## 2023-05-28 DIAGNOSIS — I739 Peripheral vascular disease, unspecified: Secondary | ICD-10-CM | POA: Diagnosis not present

## 2023-06-04 DIAGNOSIS — R001 Bradycardia, unspecified: Secondary | ICD-10-CM | POA: Diagnosis not present

## 2023-06-05 ENCOUNTER — Ambulatory Visit (INDEPENDENT_AMBULATORY_CARE_PROVIDER_SITE_OTHER): Payer: PPO

## 2023-06-05 DIAGNOSIS — Z0000 Encounter for general adult medical examination without abnormal findings: Secondary | ICD-10-CM

## 2023-06-05 NOTE — Progress Notes (Signed)
Subjective:   Christina Blackburn is a 77 y.o. female who presents for Medicare Annual (Subsequent) preventive examination.  Visit Complete: Virtual  I connected with  Christina Blackburn on 06/05/23 by a audio enabled telemedicine application and verified that I am speaking with the correct person using two identifiers.  Patient Location: Home  Provider Location: Office/Clinic  I discussed the limitations of evaluation and management by telemedicine. The patient expressed understanding and agreed to proceed.  Vital Signs: Unable to obtain new vitals due to this being a telehealth visit.   Review of Systems     Cardiac Risk Factors include: advanced age (>77men, >80 women);diabetes mellitus;dyslipidemia;hypertension;sedentary lifestyle     Objective:    Today's Vitals   06/05/23 1500  PainSc: 0-No pain   There is no height or weight on file to calculate BMI.     06/05/2023    3:07 PM 10/09/2019    5:31 PM 10/07/2019   11:00 PM 09/30/2019    4:45 PM 09/18/2019   11:40 AM 06/12/2019    4:24 PM 12/01/2018    2:53 PM  Advanced Directives  Does Patient Have a Medical Advance Directive? No No No No No No No  Would patient like information on creating a medical advance directive? No - Patient declined No - Patient declined No - Patient declined    No - Patient declined    Current Medications (verified) Outpatient Encounter Medications as of 06/05/2023  Medication Sig   acetaminophen (TYLENOL) 325 MG tablet Take 2 tablets (650 mg total) by mouth every 6 (six) hours as needed for mild pain or headache (fever >/= 101).   atorvastatin (LIPITOR) 20 MG tablet Take 2 tablets (40 mg total) by mouth daily.   Calcium Carbonate-Vitamin D (CALCIUM 600+D PO) Take 1 tablet by mouth 2 (two) times daily.   carvedilol (COREG) 3.125 MG tablet Take 3.125 mg by mouth 2 (two) times daily with a meal.   cholecalciferol (VITAMIN D) 25 MCG (1000 UT) tablet Take 1 tablet (1,000 Units total) by  mouth daily.   clopidogrel (PLAVIX) 75 MG tablet Take 1 tablet (75 mg total) by mouth daily.   Continuous Glucose Sensor (FREESTYLE LIBRE 3 SENSOR) MISC 1 each by Does not apply route continuous. Place 1 sensor on the skin every 14 days. Use to check glucose continuously   docusate sodium (COLACE) 100 MG capsule Take 100 mg by mouth daily.   DULoxetine (CYMBALTA) 30 MG capsule Take 1 capsule (30 mg total) by mouth daily.   empagliflozin (JARDIANCE) 25 MG TABS tablet Take 1 tablet (25 mg total) by mouth daily before breakfast.   fenofibrate (TRICOR) 145 MG tablet Take 1 tablet (145 mg total) by mouth daily.   ferrous sulfate 324 MG TBEC Take 324 mg by mouth.   furosemide (LASIX) 20 MG tablet Take 20 mg by mouth daily as needed for fluid.    gabapentin (NEURONTIN) 300 MG capsule Take 300 mg by mouth at bedtime.   glimepiride (AMARYL) 4 MG tablet Take 1 tablet (4 mg total) by mouth daily with breakfast.   magnesium oxide (MAG-OX) 400 MG tablet Take 400 mg by mouth daily.   Multiple Vitamin (MULTIVITAMIN WITH MINERALS) TABS tablet Take 1 tablet by mouth daily.   senna-docusate (SENOKOT-S) 8.6-50 MG tablet Take 1 tablet by mouth at bedtime as needed for mild constipation.   spironolactone (ALDACTONE) 25 MG tablet Take 12.5 mg by mouth daily.   vitamin B-12 (CYANOCOBALAMIN) 1000 MCG tablet Take 1,000  mcg by mouth daily.   vitamin E 400 UNIT capsule Take 400 Units by mouth daily.    rivaroxaban (XARELTO) 20 MG TABS tablet Take 20 mg by mouth daily with supper. (Patient not taking: Reported on 06/05/2023)   zinc sulfate 220 (50 Zn) MG capsule Take 1 capsule (220 mg total) by mouth daily. (Patient not taking: Reported on 06/05/2023)   No facility-administered encounter medications on file as of 06/05/2023.    Allergies (verified) Tape   History: Past Medical History:  Diagnosis Date   Anemia    CHF (congestive heart failure) (HCC)    Chicken pox    Colon polyps    Complication of anesthesia     Coronary artery disease    Cystitis 1986   Diabetes mellitus without complication (HCC) 2001   Diverticulosis    Heart disease 2008   Heart murmur    Hyperlipidemia    Hypertension 2003   Pacemaker 08/12/2020   PAD (peripheral artery disease) (HCC)    PONV (postoperative nausea and vomiting)    Rotator cuff tendinitis    Squamous cell skin cancer 2016   Past Surgical History:  Procedure Laterality Date   ABDOMINAL HYSTERECTOMY  1989   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   APPLICATION OF WOUND VAC Bilateral 12/02/2018   Procedure: APPLICATION OF WOUND VAC;  Surgeon: Renford Dills, MD;  Location: [facility] ORS;  Service: Vascular;  Laterality: Bilateral;   BREAST BIOPSY Left 2014   neg   BYPASS GRAFTING FEMORAL/ANTERIOR TIBIAL W/VEIN Right 11/07/2022   CARDIAC CATHETERIZATION     cornary stent   CESAREAN SECTION  1971, 1975   CHOLECYSTECTOMY  1987   COLONOSCOPY WITH PROPOFOL N/A 09/22/2018   Procedure: COLONOSCOPY WITH PROPOFOL;  Surgeon: Christena Deem, MD;  Location: [facility] END[facility]COPY;  Service: Endoscopy;  Laterality: N/A;   COLONOSCOPY WITH PROPOFOL N/A 12/15/2021   Procedure: COLONOSCOPY WITH PROPOFOL;  Surgeon: Regis Bill, MD;  Location: [facility] ENDOSCOPY;  Service: Endoscopy;  Laterality: N/A;  DM, WHEELCHAIR   CORONARY ANGIOPLASTY  2008   stent x1   ENDARTERECTOMY FEMORAL Bilateral 11/12/2018   Procedure: ENDARTERECTOMY FEMORAL;  Surgeon: Renford Dills, MD;  Location: [facility] ORS;  Service: Vascular;  Laterality: Bilateral;   INSERTION OF ILIAC STENT Bilateral 11/12/2018   Procedure: INSERTION OF ILIAC STENT;  Surgeon: Renford Dills, MD;  Location: [facility] ORS;  Service: Vascular;  Laterality: Bilateral;   LOWER EXTREMITY ANGIOGRAPHY Left 08/05/2018   Procedure: LOWER EXTREMITY ANGIOGRAPHY;  Surgeon: Renford Dills, MD;  Location: [facility] INVASIVE CV LAB;  Service: Cardiovascular;  Laterality: Left;   REVERSE SHOULDER ARTHROPLASTY Right 04/08/2018    Procedure: REVERSE SHOULDER ARTHROPLASTY;  Surgeon: Christena Flake, MD;  Location: [facility] OR[facility];  Service: Orthopedics;  Laterality: Right;   TUBAL LIGATION     WOUND DEBRIDEMENT Bilateral 12/02/2018   Procedure: DEBRIDEMENT WOUND;  Surgeon: Renford Dills, MD;  Location: [facility] ORS;  Service: Vascular;  Laterality: Bilateral;   Family History  Problem Relation Age of Onset   Colon cancer Cousin 30   Lymphoma Brother 67   Lung cancer Mother    Hypertension Father    Kidney failure Father    Breast cancer Neg Hx    Social History   Socioeconomic History   Marital status: Widowed    Spouse name: Not on file   Number of children: Not on file   Years of education: Not on file   Highest education level: Not on file  Occupational History   Occupation: Retired    Comment: Publishing rights manager.   Tobacco Use   Smoking status: Former    Current packs/day: 0.00    Types: Cigarettes    Start date: 2002    Quit date: 2008    Years since quitting: 16.6   Smokeless tobacco: Never  Vaping Use   Vaping status: Never Used  Substance and Sexual Activity   Alcohol use: Yes    Comment: rare   Drug use: No   Sexual activity: Not Currently  Other Topics Concern   Not on file  Social History Narrative   Not on file   Social Determinants of Health   Financial Resource Strain: Low Risk  (06/05/2023)   Overall Financial Resource Strain (CARDIA)    Difficulty of Paying Living Expenses: Not hard at all  Food Insecurity: No Food Insecurity (06/05/2023)   Hunger Vital Sign    Worried About Running Out of Food in the Last Year: Never true    Ran Out of Food in the Last Year: Never true  Transportation Needs: No Transportation Needs (06/05/2023)   PRAPARE - Administrator, Civil Service (Medical): No    Lack of Transportation (Non-Medical): No  Physical Activity: Inactive (06/05/2023)   Exercise Vital Sign    Days of Exercise per Week: 0 days    Minutes of Exercise per Session: 0 min  Stress:  No Stress Concern Present (06/05/2023)   [employer] of Occupational Health - Occupational Stress Questionnaire    Feeling of Stress : Not at all  Social Connections: Socially Isolated (06/05/2023)   Social Connection and Isolation Panel [NHANES]    Frequency of Communication with Friends and Family: More than three times a week    Frequency of Social Gatherings with Friends and Family: Once a week    Attends Religious Services: Never    Database administrator or Organizations: No    Attends Banker Meetings: Never    Marital Status: Widowed    Tobacco Counseling Counseling given: Not Answered   Clinical Intake:  Pre-visit preparation completed: Yes  Pain : No/denies pain Pain Score: 0-No pain     Nutritional Risks: None Diabetes: Yes CBG done?: No Did pt. bring in CBG monitor from home?: No  How often do you need to have someone help you when you read instructions, pamphlets, or other written materials from your doctor or pharmacy?: 1 - Never  Interpreter Needed?: No  Information entered by :: Kennedy Bucker, LPN   Activities of Daily Living    06/05/2023    3:07 PM  In your present state of health, do you have any difficulty performing the following activities:  Hearing? 0  Vision? 0  Difficulty concentrating or making decisions? 0  Walking or climbing stairs? 1  Comment HAS ONLY ONE LEG  Dressing or bathing? 0  Doing errands, shopping? 0  Preparing Food and eating ? N  Using the Toilet? N  In the past six months, have you accidently leaked urine? N  Do you have problems with loss of bowel control? N  Managing your Medications? N  Managing your Finances? N  Housekeeping or managing your Housekeeping? Y    Patient Care Team: Reubin Milan, MD as PCP - General (Internal Medicine) Lemar Livings Merrily Pew, MD (General Surgery) Al-Khatib, Bonnee Quin, MD as Referring Physician (Cardiology) Arneta Cliche, [place] (Vascular Surgery) Buckle,  Fayrene Fearing, FNP (Plastic Surgery) Rymer, Trellis Paganini, MD as  Referring Physician (Cardiology)  Indicate any recent Medical Services you may have received from other than [facility] providers in the past year (date may be approximate).     Assessment:   This is a routine wellness examination for Christina Blackburn.  Hearing/Vision screen Hearing Screening - Comments:: NO AIDS Vision Screening - Comments:: WEARS EYE GLASSES- [facility]  Dietary issues and exercise activities discussed:     Goals Addressed             This Visit's Progress    DIET - EAT MORE FRUITS AND VEGETABLES         Depression Screen    06/05/2023    3:05 PM 05/14/2023    2:57 PM  PHQ 2/9 Scores  PHQ - 2 Score 0 0  PHQ- 9 Score 0 0    Fall Risk    06/05/2023    3:07 PM 05/14/2023    2:57 PM  Fall Risk   Falls in the past year? 0 0  Number falls in past yr: 0 0  Injury with Fall? 0 0  Risk for fall due to : No Fall Risks No Fall Risks  Follow up Falls prevention discussed;Falls evaluation completed Falls evaluation completed    MEDICARE RISK AT HOME: Medicare Risk at Home Any stairs in or around the home?: No If so, are there any without handrails?: No Home free of loose throw rugs in walkways, pet beds, electrical cords, etc?: Yes Adequate lighting in your home to reduce risk of falls?: Yes Life alert?: No Use of a cane, walker or w/c?: Yes (W/C BOUND, HAS ONE LEG) Grab bars in the bathroom?: Yes Shower chair or bench in shower?: Yes Elevated toilet seat or a handicapped toilet?: Yes  TIMED UP AND GO:  Was the test performed?  No    Cognitive Function:        06/05/2023    3:13 PM  6CIT Screen  What Year? 0 points  What month? 0 points  What time? 0 points  Count back from 20 0 points  Months in reverse 0 points  Repeat phrase 0 points  Total Score 0 points    Immunizations Immunization History  Administered Date(s) Administered   Influenza Inj Mdck Quad Pf 07/21/2018   Influenza,  High Dose Seasonal PF 07/11/2019   Influenza,inj,Quad PF,6+ Mos 07/26/2020   Influenza,inj,quad, With Preservative 07/30/2017   Influenza-Unspecified 10/21/2013, 07/27/2014, 07/16/2015   Pneumococcal Polysaccharide-23 04/09/2018    TDAP status: Due, Education has been provided regarding the importance of this vaccine. Advised may receive this vaccine at local pharmacy or Health Dept. Aware to provide a copy of the vaccination record if obtained from local pharmacy or Health Dept. Verbalized acceptance and understanding.  Flu Vaccine status: Declined, Education has been provided regarding the importance of this vaccine but patient still declined. Advised may receive this vaccine at local pharmacy or Health Dept. Aware to provide a copy of the vaccination record if obtained from local pharmacy or Health Dept. Verbalized acceptance and understanding.  Pneumococcal vaccine status: Up to date  Covid-19 vaccine status: Declined, Education has been provided regarding the importance of this vaccine but patient still declined. Advised may receive this vaccine at local pharmacy or Health Dept.or vaccine clinic. Aware to provide a copy of the vaccination record if obtained from local pharmacy or Health Dept. Verbalized acceptance and understanding.  Qualifies for Shingles Vaccine? Yes   Zostavax completed No   Shingrix Completed?: No.    Education has been  provided regarding the importance of this vaccine. Patient has been advised to call insurance company to determine out of pocket expense if they have not yet received this vaccine. Advised may also receive vaccine at local pharmacy or Health Dept. Verbalized acceptance and understanding.  Screening Tests Health Maintenance  Topic Date Due   COVID-19 Vaccine (1) Never done   FOOT EXAM  Never done   Diabetic kidney evaluation - Urine ACR  Never done   Hepatitis C Screening  Never done   DTaP/Tdap/Td (1 - Tdap) Never done   Zoster Vaccines- Shingrix  (1 of 2) Never done   DEXA SCAN  Never done   Pneumonia Vaccine 70+ Years old (2 of 2 - PCV) 04/10/2019   INFLUENZA VACCINE  05/16/2023   HEMOGLOBIN A1C  11/14/2023   MAMMOGRAM  04/09/2024   OPHTHALMOLOGY EXAM  04/15/2024   Diabetic kidney evaluation - eGFR measurement  05/13/2024   Medicare Annual Wellness (AWV)  06/04/2024   Colonoscopy  12/16/2031   HPV VACCINES  Aged Out    Health Maintenance  Health Maintenance Due  Topic Date Due   COVID-19 Vaccine (1) Never done   FOOT EXAM  Never done   Diabetic kidney evaluation - Urine ACR  Never done   Hepatitis C Screening  Never done   DTaP/Tdap/Td (1 - Tdap) Never done   Zoster Vaccines- Shingrix (1 of 2) Never done   DEXA SCAN  Never done   Pneumonia Vaccine 43+ Years old (2 of 2 - PCV) 04/10/2019   INFLUENZA VACCINE  05/16/2023    Colorectal cancer screening: Type of screening: Colonoscopy. Completed 12/15/21. Repeat every 10 years- AGED OUT  Mammogram status: Completed SCHEDULED 06/13/23. Repeat every year  DECLINED REFERRAL FOR BDS  Lung Cancer Screening: (Low Dose CT Chest recommended if Age 66-80 years, 20 pack-year currently smoking OR have quit w/in 15years.) does not qualify.    Additional Screening:  Hepatitis C Screening: does qualify; Completed NO  Vision Screening: Recommended annual ophthalmology exams for early detection of glaucoma and other disorders of the eye. Is the patient up to date with their annual eye exam?  Yes  Who is the provider or what is the name of the office in which the patient attends annual eye exams? [facility] If pt is not established with a provider, would they like to be referred to a provider to establish care? No .   Dental Screening: Recommended annual dental exams for proper oral hygiene   Community Resource Referral / Chronic Care Management: CRR required this visit?  No   CCM required this visit?  No     Plan:     I have personally reviewed and noted the following in  the patient's chart:   Medical and social history Use of alcohol, tobacco or illicit drugs  Current medications and supplements including opioid prescriptions. Patient is not currently taking opioid prescriptions. Functional ability and status Nutritional status Physical activity Advanced directives List of other physicians Hospitalizations, surgeries, and ER visits in previous 12 months Vitals Screenings to include cognitive, depression, and falls Referrals and appointments  In addition, I have reviewed and discussed with patient certain preventive protocols, quality metrics, and best practice recommendations. A written personalized care plan for preventive services as well as general preventive health recommendations were provided to patient.     Hal Hope, LPN   [id number]   After Visit Summary: (MyChart) Due to this being a telephonic visit, the after visit summary with  patients personalized plan was offered to patient via MyChart   Nurse Notes: NONE

## 2023-06-05 NOTE — Patient Instructions (Signed)
Christina Blackburn , Thank you for taking time to come for your Medicare Wellness Visit. I appreciate your ongoing commitment to your health goals. Please review the following plan we discussed and let me know if I can assist you in the future.   Referrals/Orders/Follow-Ups/Clinician Recommendations: NONE  This is a list of the screening recommended for you and due dates:  Health Maintenance  Topic Date Due   COVID-19 Vaccine (1) Never done   Complete foot exam   Never done   Yearly kidney health urinalysis for diabetes  Never done   Hepatitis C Screening  Never done   DTaP/Tdap/Td vaccine (1 - Tdap) Never done   Zoster (Shingles) Vaccine (1 of 2) Never done   DEXA scan (bone density measurement)  Never done   Pneumonia Vaccine (2 of 2 - PCV) 04/10/2019   Flu Shot  05/16/2023   Hemoglobin A1C  11/14/2023   Mammogram  04/09/2024   Eye exam for diabetics  04/15/2024   Yearly kidney function blood test for diabetes  05/13/2024   Medicare Annual Wellness Visit  06/04/2024   Colon Cancer Screening  12/16/2031   HPV Vaccine  Aged Out    Advanced directives: (ACP Link)Information on Advanced Care Planning can be found at [facility] of [place] Advance Health Care Directives Advance Health Care Directives ([email or web address]   Next Medicare Annual Wellness Visit scheduled for next year: Yes   06/10/24 @ 2:00 PM BY PHONE

## 2023-06-13 ENCOUNTER — Ambulatory Visit
Admission: RE | Admit: 2023-06-13 | Discharge: 2023-06-13 | Disposition: A | Discharge status: Home / Self Care | Payer: PPO | Source: Ambulatory Visit | Attending: Internal Medicine | Admitting: Internal Medicine

## 2023-06-13 ENCOUNTER — Other Ambulatory Visit: Payer: Self-pay | Admitting: Internal Medicine

## 2023-06-13 DIAGNOSIS — Z1231 Encounter for screening mammogram for malignant neoplasm of breast: Secondary | ICD-10-CM | POA: Insufficient documentation

## 2023-06-13 DIAGNOSIS — E1169 Type 2 diabetes mellitus with other specified complication: Secondary | ICD-10-CM

## 2023-06-28 DIAGNOSIS — L298 Other pruritus: Secondary | ICD-10-CM | POA: Diagnosis not present

## 2023-06-28 DIAGNOSIS — L821 Other seborrheic keratosis: Secondary | ICD-10-CM | POA: Diagnosis not present

## 2023-06-28 DIAGNOSIS — L570 Actinic keratosis: Secondary | ICD-10-CM | POA: Diagnosis not present

## 2023-07-03 ENCOUNTER — Ambulatory Visit: Payer: Self-pay | Admitting: Internal Medicine

## 2023-07-08 DIAGNOSIS — Z89612 Acquired absence of left leg above knee: Secondary | ICD-10-CM | POA: Diagnosis not present

## 2023-07-08 DIAGNOSIS — D692 Other nonthrombocytopenic purpura: Secondary | ICD-10-CM | POA: Diagnosis not present

## 2023-07-08 DIAGNOSIS — Z7984 Long term (current) use of oral hypoglycemic drugs: Secondary | ICD-10-CM | POA: Diagnosis not present

## 2023-07-08 DIAGNOSIS — E1151 Type 2 diabetes mellitus with diabetic peripheral angiopathy without gangrene: Secondary | ICD-10-CM | POA: Diagnosis not present

## 2023-07-08 DIAGNOSIS — Z95820 Peripheral vascular angioplasty status with implants and grafts: Secondary | ICD-10-CM | POA: Diagnosis not present

## 2023-07-08 DIAGNOSIS — F325 Major depressive disorder, single episode, in full remission: Secondary | ICD-10-CM | POA: Diagnosis not present

## 2023-07-08 DIAGNOSIS — I509 Heart failure, unspecified: Secondary | ICD-10-CM | POA: Diagnosis not present

## 2023-07-08 DIAGNOSIS — Z515 Encounter for palliative care: Secondary | ICD-10-CM | POA: Diagnosis not present

## 2023-07-08 DIAGNOSIS — Z09 Encounter for follow-up examination after completed treatment for conditions other than malignant neoplasm: Secondary | ICD-10-CM | POA: Diagnosis not present

## 2023-07-08 DIAGNOSIS — I110 Hypertensive heart disease with heart failure: Secondary | ICD-10-CM | POA: Diagnosis not present

## 2023-07-10 DIAGNOSIS — I050 Rheumatic mitral stenosis: Secondary | ICD-10-CM | POA: Diagnosis not present

## 2023-07-10 DIAGNOSIS — Z45018 Encounter for adjustment and management of other part of cardiac pacemaker: Secondary | ICD-10-CM | POA: Diagnosis not present

## 2023-07-10 DIAGNOSIS — I498 Other specified cardiac arrhythmias: Secondary | ICD-10-CM | POA: Diagnosis not present

## 2023-07-10 DIAGNOSIS — I350 Nonrheumatic aortic (valve) stenosis: Secondary | ICD-10-CM | POA: Diagnosis not present

## 2023-07-10 DIAGNOSIS — R001 Bradycardia, unspecified: Secondary | ICD-10-CM | POA: Diagnosis not present

## 2023-07-10 DIAGNOSIS — I10 Essential (primary) hypertension: Secondary | ICD-10-CM | POA: Diagnosis not present

## 2023-07-10 DIAGNOSIS — Z950 Presence of cardiac pacemaker: Secondary | ICD-10-CM | POA: Diagnosis not present

## 2023-07-10 DIAGNOSIS — I2510 Atherosclerotic heart disease of native coronary artery without angina pectoris: Secondary | ICD-10-CM | POA: Diagnosis not present

## 2023-07-10 DIAGNOSIS — I4510 Unspecified right bundle-branch block: Secondary | ICD-10-CM | POA: Diagnosis not present

## 2023-07-10 DIAGNOSIS — Z8679 Personal history of other diseases of the circulatory system: Secondary | ICD-10-CM | POA: Diagnosis not present

## 2023-08-28 DIAGNOSIS — Z95828 Presence of other vascular implants and grafts: Secondary | ICD-10-CM | POA: Diagnosis not present

## 2023-08-28 DIAGNOSIS — I779 Disorder of arteries and arterioles, unspecified: Secondary | ICD-10-CM | POA: Diagnosis not present

## 2023-08-28 DIAGNOSIS — Z89612 Acquired absence of left leg above knee: Secondary | ICD-10-CM | POA: Diagnosis not present

## 2023-08-29 ENCOUNTER — Encounter: Payer: Self-pay | Admitting: Internal Medicine

## 2023-09-04 ENCOUNTER — Ambulatory Visit (INDEPENDENT_AMBULATORY_CARE_PROVIDER_SITE_OTHER): Payer: PPO | Admitting: Internal Medicine

## 2023-09-04 ENCOUNTER — Encounter: Payer: Self-pay | Admitting: Internal Medicine

## 2023-09-04 ENCOUNTER — Telehealth: Payer: Self-pay | Admitting: Internal Medicine

## 2023-09-04 ENCOUNTER — Other Ambulatory Visit: Payer: Self-pay | Admitting: Internal Medicine

## 2023-09-04 VITALS — BP 128/70 | HR 87 | Temp 98.2°F | Ht 59.0 in | Wt 154.0 lb

## 2023-09-04 DIAGNOSIS — E1169 Type 2 diabetes mellitus with other specified complication: Secondary | ICD-10-CM | POA: Diagnosis not present

## 2023-09-04 DIAGNOSIS — E118 Type 2 diabetes mellitus with unspecified complications: Secondary | ICD-10-CM

## 2023-09-04 DIAGNOSIS — I10 Essential (primary) hypertension: Secondary | ICD-10-CM | POA: Diagnosis not present

## 2023-09-04 DIAGNOSIS — Z7984 Long term (current) use of oral hypoglycemic drugs: Secondary | ICD-10-CM | POA: Diagnosis not present

## 2023-09-04 DIAGNOSIS — Z23 Encounter for immunization: Secondary | ICD-10-CM | POA: Diagnosis not present

## 2023-09-04 DIAGNOSIS — M65339 Trigger finger, unspecified middle finger: Secondary | ICD-10-CM | POA: Diagnosis not present

## 2023-09-04 DIAGNOSIS — L570 Actinic keratosis: Secondary | ICD-10-CM | POA: Diagnosis not present

## 2023-09-04 DIAGNOSIS — E785 Hyperlipidemia, unspecified: Secondary | ICD-10-CM | POA: Diagnosis not present

## 2023-09-04 DIAGNOSIS — I739 Peripheral vascular disease, unspecified: Secondary | ICD-10-CM

## 2023-09-04 DIAGNOSIS — I050 Rheumatic mitral stenosis: Secondary | ICD-10-CM

## 2023-09-04 DIAGNOSIS — L821 Other seborrheic keratosis: Secondary | ICD-10-CM | POA: Diagnosis not present

## 2023-09-04 DIAGNOSIS — I2510 Atherosclerotic heart disease of native coronary artery without angina pectoris: Secondary | ICD-10-CM | POA: Diagnosis not present

## 2023-09-04 DIAGNOSIS — Z89612 Acquired absence of left leg above knee: Secondary | ICD-10-CM

## 2023-09-04 LAB — POCT GLYCOSYLATED HEMOGLOBIN (HGB A1C): Hemoglobin A1C: 5.8 % — AB (ref 4.0–5.6)

## 2023-09-04 NOTE — Assessment & Plan Note (Signed)
No current issues

## 2023-09-04 NOTE — Progress Notes (Signed)
Date:  09/04/2023   Name:  Christina Blackburn   DOB:  04/04/1946   MRN:  [id number]   Chief Complaint: Diabetes and Hypertension  Diabetes She presents for her follow-up diabetic visit. She has type 2 diabetes mellitus. Her disease course has been stable. Pertinent negatives for hypoglycemia include no dizziness, headaches or nervousness/anxiousness. Pertinent negatives for diabetes include no chest pain and no fatigue. Diabetic complications include PVD. Pertinent negatives for diabetic complications include no CVA. Current diabetic treatments: jardiance and amaryl. An ACE inhibitor/angiotensin II receptor blocker is being taken.  Hypertension This is a chronic problem. The problem is unchanged. Pertinent negatives include no chest pain, headaches, palpitations or shortness of breath. Past treatments include angiotensin blockers, diuretics and beta blockers. The current treatment provides moderate improvement. Hypertensive end-organ damage includes CAD/MI and PVD. There is no history of kidney disease or CVA.  Hyperlipidemia This is a chronic problem. The problem is controlled. Pertinent negatives include no chest pain or shortness of breath. Current antihyperlipidemic treatment includes statins and fibric acid derivatives. The current treatment provides significant improvement of lipids.  Hand Pain  There was no injury mechanism. The pain is present in the left hand and right hand. The quality of the pain is described as burning and shooting (triggering). Pertinent negatives include no chest pain.  PVD - on DAPT s/p revascularization RLE 04/2023.  Recent follow up done - well healed. On Plavix, Xarelto, atorvastatin.   Review of Systems  Constitutional:  Negative for chills, fatigue and fever.  HENT:  Negative for trouble swallowing.   Eyes:  Negative for visual disturbance.  Respiratory:  Negative for chest tightness, shortness of breath and wheezing.   Cardiovascular:  Negative for  chest pain, palpitations and leg swelling.  Musculoskeletal:  Positive for arthralgias (bilateral hand pain and wrist pain) and gait problem. Negative for joint swelling.  Skin:  Negative for rash and wound.  Neurological:  Negative for dizziness, light-headedness and headaches.  Psychiatric/Behavioral:  Positive for self-injury. Negative for hallucinations and sleep disturbance. The patient is not nervous/anxious.      Lab Results  Component Value Date   NA 142 05/14/2023   K 4.2 05/14/2023   CO2 17 (L) 05/14/2023   GLUCOSE 238 (H) 05/14/2023   BUN 21 05/14/2023   CREATININE 0.89 05/14/2023   CALCIUM 9.9 05/14/2023   EGFR 68 05/14/2023   GFRNONAA >60 10/17/2019   Lab Results  Component Value Date   CHOL 152 05/14/2023   HDL 38 (L) 05/14/2023   LDLCALC 73 05/14/2023   TRIG 254 (H) 05/14/2023   CHOLHDL 4.0 05/14/2023   Lab Results  Component Value Date   TSH 1.010 05/14/2023   Lab Results  Component Value Date   HGBA1C 5.8 (A) 09/04/2023   Lab Results  Component Value Date   WBC 7.6 10/17/2019   HGB 11.9 (L) 10/17/2019   HCT 37.5 10/17/2019   MCV 86.4 10/17/2019   PLT 410 (H) 10/17/2019   Lab Results  Component Value Date   ALT 23 05/14/2023   AST 26 05/14/2023   ALKPHOS 65 05/14/2023   BILITOT <0.2 05/14/2023   No results found for: "25OHVITD2", "25OHVITD3", "VD25OH"   Patient Active Problem List   Diagnosis Date Noted   Thyroid goiter 05/14/2023   Bilateral carotid artery stenosis 06/23/2021   Subclavian artery stenosis (HCC) 06/23/2021   Moderate mitral valve stenosis 06/23/2021   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/2020   Red  blood cell antibody positive 08/16/2020   Cardiac pacemaker in situ 08/12/2020   Bradycardia 06/17/2020   RBBB (right bundle branch block) 09/24/2019   Dermatitis 03/23/2019   Atherosclerosis of native arteries of extremity with intermittent claudication (HCC) 11/12/2018   Obesity (BMI 30.0-34.9)  04/22/2018   Status post reverse total shoulder replacement, 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   Rotator cuff tendinitis, left 03/26/2018   Spondylosis of cervical region without myelopathy or radiculopathy 03/26/2018   PAD (peripheral artery disease) (HCC) 05/16/2017   Essential hypertension 05/16/2017   Hyperlipidemia associated with type 2 diabetes mellitus (HCC) 05/16/2017   Hx of cardiac cath 05/16/2015   Type II diabetes mellitus with complication (HCC) 03/25/2014   Coronary artery disease involving native coronary artery of native heart without angina pectoris 03/25/2014   S/P TAH-BSO 03/25/2014   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    Allergies  Allergen Reactions   Tape Other (See Comments)    Hypoallergenic tape - blisters Blisters    Past Surgical History:  Procedure Laterality Date   ABDOMINAL HYSTERECTOMY  1989   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   APPLICATION OF WOUND VAC Bilateral 12/02/2018   Procedure: APPLICATION OF WOUND VAC;  Surgeon: Renford Dills, MD;  Location: [facility] ORS;  Service: Vascular;  Laterality: Bilateral;   BREAST BIOPSY Left 2014   neg   BYPASS GRAFTING FEMORAL/ANTERIOR TIBIAL W/VEIN Right 11/07/2022   CARDIAC CATHETERIZATION     cornary stent   CESAREAN SECTION  1971, 1975   CHOLECYSTECTOMY  1987   COLONOSCOPY WITH PROPOFOL N/A 09/22/2018   Procedure: COLONOSCOPY WITH PROPOFOL;  Surgeon: Christena Deem, MD;  Location: [facility] ENDOSCOPY;  Service: Endoscopy;  Laterality: N/A;   COLONOSCOPY WITH PROPOFOL N/A 12/15/2021   Procedure: COLONOSCOPY WITH PROPOFOL;  Surgeon: Regis Bill, MD;  Location: [facility] ENDOSCOPY;  Service: Endoscopy;  Laterality: N/A;  DM, WHEELCHAIR   CORONARY ANGIOPLASTY  2008   stent x1   ENDARTERECTOMY FEMORAL Bilateral 11/12/2018   Procedure: ENDARTERECTOMY FEMORAL;  Surgeon: Renford Dills, MD;  Location: [facility] ORS;  Service: Vascular;  Laterality: Bilateral;   INSERTION OF ILIAC STENT Bilateral 11/12/2018    Procedure: INSERTION OF ILIAC STENT;  Surgeon: Renford Dills, MD;  Location: [facility] ORS;  Service: Vascular;  Laterality: Bilateral;   LOWER EXTREMITY ANGIOGRAPHY Left 08/05/2018   Procedure: LOWER EXTREMITY ANGIOGRAPHY;  Surgeon: Renford Dills, MD;  Location: [facility] INVASIVE CV LAB;  Service: Cardiovascular;  Laterality: Left;   REVERSE SHOULDER ARTHROPLASTY Right 04/08/2018   Procedure: REVERSE SHOULDER ARTHROPLASTY;  Surgeon: Christena Flake, MD;  Location: [facility] ORS;  Service: Orthopedics;  Laterality: Right;   TUBAL LIGATION     WOUND DEBRIDEMENT Bilateral 12/02/2018   Procedure: DEBRIDEMENT WOUND;  Surgeon: Renford Dills, MD;  Location: [facility] ORS;  Service: Vascular;  Laterality: Bilateral;    Social History   Tobacco Use   Smoking status: Former    Current packs/day: 0.00    Types: Cigarettes    Start date: 2002    Quit date: 2008    Years since quitting: 16.8   Smokeless tobacco: Never  Vaping Use   Vaping status: Never Used  Substance Use Topics   Alcohol use: Yes    Comment: rare   Drug use: No     Medication list has been reviewed and updated.  Current Meds  Medication Sig   acetaminophen (TYLENOL) 325 MG tablet Take 2 tablets (650 mg total) by mouth every 6 (six) hours as needed for  mild pain or headache (fever >/= 101).   atorvastatin (LIPITOR) 20 MG tablet TAKE 2 TABLETS (40 MG TOTAL) BY MOUTH DAILY.   Calcium Carbonate-Vitamin D (CALCIUM 600+D PO) Take 1 tablet by mouth 2 (two) times daily.   carvedilol (COREG) 3.125 MG tablet Take 3.125 mg by mouth 2 (two) times daily with a meal.   cholecalciferol (VITAMIN D) 25 MCG (1000 UT) tablet Take 1 tablet (1,000 Units total) by mouth daily.   clopidogrel (PLAVIX) 75 MG tablet Take 1 tablet (75 mg total) by mouth daily.   Continuous Glucose Sensor (FREESTYLE LIBRE 3 SENSOR) MISC 1 each by Does not apply route continuous. Place 1 sensor on the skin every 14 days. Use to check glucose continuously   docusate sodium  (COLACE) 100 MG capsule Take 100 mg by mouth daily.   DULoxetine (CYMBALTA) 30 MG capsule Take 1 capsule (30 mg total) by mouth daily.   empagliflozin (JARDIANCE) 25 MG TABS tablet Take by mouth daily.   fenofibrate (TRICOR) 145 MG tablet Take 1 tablet (145 mg total) by mouth daily.   ferrous sulfate 324 MG TBEC Take 324 mg by mouth.   furosemide (LASIX) 20 MG tablet Take 20 mg by mouth daily as needed for fluid.    gabapentin (NEURONTIN) 300 MG capsule Take 300 mg by mouth at bedtime.   glimepiride (AMARYL) 4 MG tablet Take 1 tablet (4 mg total) by mouth daily with breakfast.   magnesium oxide (MAG-OX) 400 MG tablet Take 400 mg by mouth daily.   Multiple Vitamin (MULTIVITAMIN WITH MINERALS) TABS tablet Take 1 tablet by mouth daily.   senna-docusate (SENOKOT-S) 8.6-50 MG tablet Take 1 tablet by mouth at bedtime as needed for mild constipation.   spironolactone (ALDACTONE) 25 MG tablet Take 12.5 mg by mouth daily.   vitamin B-12 (CYANOCOBALAMIN) 1000 MCG tablet Take 1,000 mcg by mouth daily.   vitamin E 400 UNIT capsule Take 400 Units by mouth daily.    zinc sulfate 220 (50 Zn) MG capsule Take 1 capsule (220 mg total) by mouth daily.   [DISCONTINUED] losartan-hydrochlorothiazide (HYZAAR) 100-25 MG tablet Take 1 tablet by mouth daily.   [DISCONTINUED] melatonin 3 MG TABS tablet Take by mouth.       09/04/2023    2:11 PM 05/14/2023    2:57 PM  GAD 7 : Generalized Anxiety Score  Nervous, Anxious, on Edge 0 0  Control/stop worrying 0 0  Worry too much - different things 0 0  Trouble relaxing 0 0  Restless 0 0  Easily annoyed or irritable 0 0  Afraid - awful might happen 0 0  Total GAD 7 Score 0 0  Anxiety Difficulty Not difficult at all Not difficult at all       09/04/2023    2:11 PM 06/05/2023    3:05 PM 05/14/2023    2:57 PM  Depression screen PHQ 2/9  Decreased Interest 0 0 0  Down, Depressed, Hopeless 0 0 0  PHQ - 2 Score 0 0 0  Altered sleeping 0 0 0  Tired, decreased  energy 0 0 0  Change in appetite 0 0 0  Feeling bad or failure about yourself  0 0 0  Trouble concentrating 0 0 0  Moving slowly or fidgety/restless 0 0 0  Suicidal thoughts 0 0 0  PHQ-9 Score 0 0 0  Difficult doing work/chores Not difficult at all Not difficult at all Not difficult at all    BP Readings from Last 3 Encounters:  09/04/23 128/70  05/14/23 128/64  12/15/21 (!) 159/74    Physical Exam Vitals and nursing note reviewed.  Constitutional:      General: She is not in acute distress.    Appearance: Normal appearance. She is well-developed.  HENT:     Head: Normocephalic and atraumatic.  Neck:     Vascular: No carotid bruit.  Cardiovascular:     Rate and Rhythm: Normal rate and regular rhythm.     Pulses: Normal pulses.     Heart sounds: No murmur heard. Pulmonary:     Effort: Pulmonary effort is normal. No respiratory distress.     Breath sounds: No wheezing or rhonchi.  Musculoskeletal:     Right hand: Tenderness present.     Left hand: Tenderness present.     Cervical back: Normal range of motion.     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  Skin:    General: Skin is warm and dry.     Findings: No rash.  Neurological:     Mental Status: She is alert and oriented to person, place, and time.  Psychiatric:        Mood and Affect: Mood normal.        Behavior: Behavior normal.     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 Minimally active due to AKA but no chest pain or DOE      Essential hypertension    Controlled BP with normal exam. Current regimen is lasix, aldactone, Coreg,  Has not taken losartan in years Will continue same medications; encourage continued reduced sodium diet.      Hyperlipidemia associated with type 2 diabetes mellitus (HCC)    LDL is       Lab Results  Component Value Date    LDLCALC 73 05/14/2023   Current regimen is atorvastatin and fenofibrate.  Tolerating medications well without issues.      Relevant Medications   empagliflozin (JARDIANCE) 25 MG TABS tablet   PAD (peripheral artery disease) (HCC)    Followed closely by vascular surgery; on DAPT without bleeding Surgical wounds healed.      Type II diabetes mellitus with complication (HCC) - Primary    Blood sugars stable without hypoglycemic symptoms or events. Currently managed with Jardiance and amaryl. Changes made last visit are none.      Lab Results  Component Value Date    HGBA1C    She is concerned about side effects to Jardiance but has had none other than transient vaginal itching that resolves with otc topicals. I strongly encourage her to continue it for CV benefits. A1C today = 5.8  Relevant Medications   empagliflozin (JARDIANCE) 25 MG TABS tablet   Other Relevant Orders   Microalbumin / creatinine urine ratio   POCT glycosylated hemoglobin (Hb A1C) (Completed)   Other Visit Diagnoses     Trigger middle finger, unspecified laterality       Recommend appt with Dr. Ashley Royalty, SM   Need for vaccination for pneumococcus       Relevant Orders   Pneumococcal conjugate vaccine 20-valent (Completed)   Long term current use of oral hypoglycemic drug           Return in about 4 months (around 01/02/2024) for DM, HTN.    Reubin Milan,  MD [facility] [place]

## 2023-09-04 NOTE — Telephone Encounter (Signed)
Please call patient to see if she wants to see Dr. Ashley Royalty for her trigger fingers.

## 2023-09-04 NOTE — Assessment & Plan Note (Signed)
Followed by Cardiology Recent ECHO stable. Minimally active due to AKA but no chest pain or DOE

## 2023-09-04 NOTE — Assessment & Plan Note (Addendum)
Controlled BP with normal exam. Current regimen is lasix, aldactone, Coreg, and losartan hct recently added.. Will continue same medications; encourage continued reduced sodium diet.

## 2023-09-04 NOTE — Assessment & Plan Note (Signed)
LDL is       Lab Results  Component Value Date    LDLCALC 73 05/14/2023   Current regimen is atorvastatin and fenofibrate.  Tolerating medications well without issues.

## 2023-09-04 NOTE — Assessment & Plan Note (Signed)
Followed closely by Cardiology Recent ECHO showed no change in moderate AS

## 2023-09-04 NOTE — Assessment & Plan Note (Signed)
Followed closely by vascular surgery; on DAPT without bleeding Surgical wounds healed.

## 2023-09-04 NOTE — Assessment & Plan Note (Addendum)
Controlled BP with normal exam. Current regimen is lasix, aldactone, Coreg,  Has not taken losartan in years Will continue same medications; encourage continued reduced sodium diet.

## 2023-09-04 NOTE — Assessment & Plan Note (Signed)
Blood sugars stable without hypoglycemic symptoms or events. Currently managed with Jardiance and amaryl. Changes made last visit are none. Lab Results  Component Value Date   HGBA1C 6.0 (H) 05/14/2023

## 2023-09-04 NOTE — Telephone Encounter (Signed)
No referral needed to see dr Ashley Royalty for new ortho (trigger finger)

## 2023-09-04 NOTE — Progress Notes (Signed)
Date:  09/04/2023   Name:  Levern Nagasawa   DOB:  1945/11/25   MRN:  [id number]   Chief Complaint: No chief complaint on file.  Diabetes She presents for her follow-up diabetic visit. She has type 2 diabetes mellitus. Her disease course has been stable. Pertinent negatives for hypoglycemia include no headaches or tremors. Pertinent negatives for diabetes include no chest pain, no fatigue, no polydipsia and no polyuria. Symptoms are stable. Diabetic complications include PVD. Pertinent negatives for diabetic complications include no CVA. Risk factors for coronary artery disease include dyslipidemia, diabetes mellitus and hypertension. Current diabetic treatments: Jardiance and amaryl.  Hypertension This is a chronic problem. The problem is controlled. Pertinent negatives include no chest pain, headaches, palpitations or shortness of breath. Past treatments include beta blockers, angiotensin blockers and diuretics. There are no compliance problems.  Hypertensive end-organ damage includes PVD. There is no history of CVA.  PVD - on DAPT s/p revascularization RLE 04/2023.  Recent follow up done - well healed. On Plavix, Xarelto, atorvastatin.  Review of Systems  Constitutional:  Negative for appetite change, fatigue, fever and unexpected weight change.  HENT:  Negative for tinnitus and trouble swallowing.   Eyes:  Negative for visual disturbance.  Respiratory:  Negative for cough, chest tightness and shortness of breath.   Cardiovascular:  Negative for chest pain, palpitations and leg swelling.  Gastrointestinal:  Negative for abdominal pain.  Endocrine: Negative for polydipsia and polyuria.  Genitourinary:  Negative for dysuria and hematuria.  Musculoskeletal:  Negative for arthralgias.  Neurological:  Negative for tremors, numbness and headaches.  Psychiatric/Behavioral:  Negative for dysphoric mood.      Lab Results  Component Value Date   NA 142 05/14/2023   K 4.2 05/14/2023    CO2 17 (L) 05/14/2023   GLUCOSE 238 (H) 05/14/2023   BUN 21 05/14/2023   CREATININE 0.89 05/14/2023   CALCIUM 9.9 05/14/2023   EGFR 68 05/14/2023   GFRNONAA >60 10/17/2019   Lab Results  Component Value Date   CHOL 152 05/14/2023   HDL 38 (L) 05/14/2023   LDLCALC 73 05/14/2023   TRIG 254 (H) 05/14/2023   CHOLHDL 4.0 05/14/2023   Lab Results  Component Value Date   TSH 1.010 05/14/2023   Lab Results  Component Value Date   HGBA1C 6.0 (H) 05/14/2023   Lab Results  Component Value Date   WBC 7.6 10/17/2019   HGB 11.9 (L) 10/17/2019   HCT 37.5 10/17/2019   MCV 86.4 10/17/2019   PLT 410 (H) 10/17/2019   Lab Results  Component Value Date   ALT 23 05/14/2023   AST 26 05/14/2023   ALKPHOS 65 05/14/2023   BILITOT <0.2 05/14/2023   No results found for: "25OHVITD2", "25OHVITD3", "VD25OH"   Patient Active Problem List   Diagnosis Date Noted   Thyroid goiter 05/14/2023   Bilateral carotid artery stenosis 06/23/2021   Subclavian artery stenosis (HCC) 06/23/2021   Moderate mitral valve stenosis 06/23/2021   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/2020   Red blood cell antibody positive 08/16/2020   Cardiac pacemaker in situ 08/12/2020   Bradycardia 06/17/2020   RBBB (right bundle branch block) 09/24/2019   Dermatitis 03/23/2019   Atherosclerosis of native arteries of extremity with intermittent claudication (HCC) 11/12/2018   Obesity (BMI 30.0-34.9) 04/22/2018   Status post reverse total shoulder replacement, 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   Rotator cuff tendinitis, left 03/26/2018   Spondylosis of cervical region without myelopathy or radiculopathy  03/26/2018   PAD (peripheral artery disease) (HCC) 05/16/2017   Essential hypertension 05/16/2017   Hyperlipidemia associated with type 2 diabetes mellitus (HCC) 05/16/2017   Hx of cardiac cath 05/16/2015   Type II diabetes mellitus with complication (HCC) 03/25/2014   Coronary artery disease involving native  coronary artery of native heart without angina pectoris 03/25/2014   S/P TAH-BSO 03/25/2014   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    Allergies  Allergen Reactions   Tape Other (See Comments)    Hypoallergenic tape - blisters Blisters    Past Surgical History:  Procedure Laterality Date   ABDOMINAL HYSTERECTOMY  1989   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   APPLICATION OF WOUND VAC Bilateral 12/02/2018   Procedure: APPLICATION OF WOUND VAC;  Surgeon: Renford Dills, MD;  Location: [facility] ORS;  Service: Vascular;  Laterality: Bilateral;   BREAST BIOPSY Left 2014   neg   BYPASS GRAFTING FEMORAL/ANTERIOR TIBIAL W/VEIN Right 11/07/2022   CARDIAC CATHETERIZATION     cornary stent   CESAREAN SECTION  1971, 1975   CHOLECYSTECTOMY  1987   COLONOSCOPY WITH PROPOFOL N/A 09/22/2018   Procedure: COLONOSCOPY WITH PROPOFOL;  Surgeon: Christena Deem, MD;  Location[facility] ENDOSCOPY;  Service: Endoscopy;  Laterality: N/A;   COLONOSCOPY WITH PROPOFOL N/A 12/15/2021   Procedure: COLONOSCOPY WITH PROPOFOL;  Surgeon: Regis Bill, MD;  Location: [facility] ENDOSCOPY;  Service: Endoscopy;  Laterality: N/A;  DM, WHEELCHAIR   CORONARY ANGIOPLASTY  2008   stent x1   ENDARTERECTOMY FEMORAL Bilateral 11/12/2018   Procedure: ENDARTERECTOMY FEMORAL;  Surgeon: Renford Dills, MD;  Location: [facility] ORS;  Service: Vascular;  Laterality: Bilateral;   INSERTION OF ILIAC STENT Bilateral 11/12/2018   Procedure: INSERTION OF ILIAC STENT;  Surgeon: Renford Dills, MD;  Location: [facility] ORS;  Service: Vascular;  Laterality: Bilateral;   LOWER EXTREMITY ANGIOGRAPHY Left 08/05/2018   Procedure: LOWER EXTREMITY ANGIOGRAPHY;  Surgeon: Renford Dills, MD;  Location: [facility] INVASIVE CV LAB;  Service: Cardiovascular;  Laterality: Left;   REVERSE SHOULDER ARTHROPLASTY Right 04/08/2018   Procedure: REVERSE SHOULDER ARTHROPLASTY;  Surgeon: Christena Flake, MD;  Location: [facility] ORS;  Service: Orthopedics;   Laterality: Right;   TUBAL LIGATION     WOUND DEBRIDEMENT Bilateral 12/02/2018   Procedure: DEBRIDEMENT WOUND;  Surgeon: Renford Dills, MD;  Location: [facility] ORS;  Service: Vascular;  Laterality: Bilateral;    Social History   Tobacco Use   Smoking status: Former    Current packs/day: 0.00    Types: Cigarettes    Start date: 2002    Quit date: 2008    Years since quitting: 16.8   Smokeless tobacco: Never  Vaping Use   Vaping status: Never Used  Substance Use Topics   Alcohol use: Yes    Comment: rare   Drug use: No     Medication list has been reviewed and updated.  Current Meds  Medication Sig   losartan-hydrochlorothiazide (HYZAAR) 100-25 MG tablet Take 1 tablet by mouth daily.   melatonin 3 MG TABS tablet Take by mouth.       05/14/2023    2:57 PM  GAD 7 : Generalized Anxiety Score  Nervous, Anxious, on Edge 0  Control/stop worrying 0  Worry too much - different things 0  Trouble relaxing 0  Restless 0  Easily annoyed or irritable 0  Afraid - awful might happen 0  Total GAD 7 Score 0  Anxiety Difficulty Not difficult at all  06/05/2023    3:05 PM 05/14/2023    2:57 PM  Depression screen PHQ 2/9  Decreased Interest 0 0  Down, Depressed, Hopeless 0 0  PHQ - 2 Score 0 0  Altered sleeping 0 0  Tired, decreased energy 0 0  Change in appetite 0 0  Feeling bad or failure about yourself  0 0  Trouble concentrating 0 0  Moving slowly or fidgety/restless 0 0  Suicidal thoughts 0 0  PHQ-9 Score 0 0  Difficult doing work/chores Not difficult at all Not difficult at all    BP Readings from Last 3 Encounters:  05/14/23 128/64  12/15/21 (!) 159/74  10/17/19 (!) 132/54    Physical Exam   Diabetic Foot Exam - Simple   No data filed     Wt Readings from Last 3 Encounters:  05/14/23 145 lb (65.8 kg)  12/15/21 160 lb (72.6 kg)  10/09/19 164 lb 7.4 oz (74.6 kg)    There were no vitals taken for this visit.  Assessment and Plan:  Problem  List Items Addressed This Visit   None   No follow-ups on file.    Reubin Milan, MD [facility] [place]

## 2023-09-04 NOTE — Assessment & Plan Note (Addendum)
Blood sugars stable without hypoglycemic symptoms or events. Currently managed with Jardiance and amaryl. Changes made last visit are none.      Lab Results  Component Value Date    HGBA1C    She is concerned about side effects to Jardiance but has had none other than transient vaginal itching that resolves with otc topicals. I strongly encourage her to continue it for CV benefits. A1C today = 5.8

## 2023-09-05 LAB — MICROALBUMIN / CREATININE URINE RATIO
Creatinine, Urine: 30.7 mg/dL
Microalb/Creat Ratio: 104 mg/g{creat} — ABNORMAL HIGH (ref 0–29)
Microalbumin, Urine: 32 ug/mL

## 2023-09-06 ENCOUNTER — Encounter: Payer: Self-pay | Admitting: Internal Medicine

## 2023-09-10 ENCOUNTER — Other Ambulatory Visit: Payer: Self-pay | Admitting: Internal Medicine

## 2023-09-10 DIAGNOSIS — E118 Type 2 diabetes mellitus with unspecified complications: Secondary | ICD-10-CM

## 2023-09-10 NOTE — Telephone Encounter (Signed)
Requested Prescriptions  Refused Prescriptions Disp Refills   glimepiride (AMARYL) 4 MG tablet [Pharmacy Med Name: GLIMEPIRIDE 4 MG TABLET] 90 tablet 1    Sig: TAKE 1 TABLET BY MOUTH DAILY WITH BREAKFAST     Endocrinology:  Diabetes - Sulfonylureas Passed - 09/10/2023  1:33 AM      Passed - HBA1C is between 0 and 7.9 and within 180 days    Hemoglobin A1C  Date Value Ref Range Status  09/04/2023 5.8 (A) 4.0 - 5.6 % Final   Hgb A1c MFr Bld  Date Value Ref Range Status  05/14/2023 6.0 (H) 4.8 - 5.6 % Final    Comment:             Prediabetes: 5.7 - 6.4          Diabetes: >6.4          Glycemic control for adults with diabetes: <7.0          Passed - Cr in normal range and within 360 days    Creatinine, Ser  Date Value Ref Range Status  05/14/2023 0.89 0.57 - 1.00 mg/dL Final         Passed - Valid encounter within last 6 months    Recent Outpatient Visits           6 days ago Type II diabetes mellitus with complication [facility])   [place] [facility] at [facility], Nyoka Cowden, MD   3 months ago Type II diabetes mellitus with complication [facility])   [place] [facility] at [facility], Nyoka Cowden, MD

## 2023-09-16 ENCOUNTER — Ambulatory Visit
Admission: RE | Admit: 2023-09-16 | Discharge: 2023-09-16 | Disposition: A | Discharge status: Home / Self Care | Payer: PPO | Attending: Family Medicine | Admitting: Family Medicine

## 2023-09-16 ENCOUNTER — Ambulatory Visit
Admission: RE | Admit: 2023-09-16 | Discharge: 2023-09-16 | Disposition: A | Discharge status: Home / Self Care | Payer: PPO | Source: Ambulatory Visit | Attending: Family Medicine | Admitting: Family Medicine

## 2023-09-16 ENCOUNTER — Ambulatory Visit (INDEPENDENT_AMBULATORY_CARE_PROVIDER_SITE_OTHER): Payer: PPO | Admitting: Family Medicine

## 2023-09-16 ENCOUNTER — Encounter: Payer: Self-pay | Admitting: Family Medicine

## 2023-09-16 ENCOUNTER — Ambulatory Visit
Admission: RE | Admit: 2023-09-16 | Discharge: 2023-09-16 | Disposition: A | Discharge status: Home / Self Care | Payer: PPO | Source: Ambulatory Visit | Attending: Family Medicine

## 2023-09-16 VITALS — BP 130/74 | HR 87 | Ht 59.0 in | Wt 149.8 lb

## 2023-09-16 DIAGNOSIS — M25532 Pain in left wrist: Secondary | ICD-10-CM | POA: Diagnosis not present

## 2023-09-16 DIAGNOSIS — M79641 Pain in right hand: Secondary | ICD-10-CM | POA: Insufficient documentation

## 2023-09-16 DIAGNOSIS — M19031 Primary osteoarthritis, right wrist: Secondary | ICD-10-CM | POA: Diagnosis not present

## 2023-09-16 DIAGNOSIS — M79642 Pain in left hand: Secondary | ICD-10-CM | POA: Insufficient documentation

## 2023-09-16 DIAGNOSIS — M25531 Pain in right wrist: Secondary | ICD-10-CM | POA: Diagnosis not present

## 2023-09-16 DIAGNOSIS — M1812 Unilateral primary osteoarthritis of first carpometacarpal joint, left hand: Secondary | ICD-10-CM | POA: Diagnosis not present

## 2023-09-16 DIAGNOSIS — M1811 Unilateral primary osteoarthritis of first carpometacarpal joint, right hand: Secondary | ICD-10-CM | POA: Diagnosis not present

## 2023-09-16 DIAGNOSIS — M778 Other enthesopathies, not elsewhere classified: Secondary | ICD-10-CM | POA: Diagnosis not present

## 2023-09-16 DIAGNOSIS — M19032 Primary osteoarthritis, left wrist: Secondary | ICD-10-CM | POA: Diagnosis not present

## 2023-09-16 NOTE — Patient Instructions (Signed)
-  HAND PAIN: Hand pain and stiffness, especially in the morning, can be a sign of osteoarthritis, which is a condition where the protective cartilage on the ends of your bones wears down over time. -To confirm this diagnosis and assess the severity, we will order x-rays of your hands and wrists. -In the meantime, you should apply Voltaren gel 2-3 times daily for at least two weeks and continue taking Tylenol as needed for pain. -Gentle movement of your hands throughout the day is encouraged. -If the pain persists despite these treatments, we may consider a steroid injection into the joint.

## 2023-09-16 NOTE — Assessment & Plan Note (Signed)
History of Present Illness Christina Blackburn presents with pain and stiffness in both hands, particularly in the middle fingers. The symptoms began earlier in the year. Initially, the patient experienced issues with one finger, which has since improved. However, the symptoms have now spread to other fingers. The patient reports that the pain and stiffness are worse in the mornings but improve as the day progresses and she uses her hands. The patient has been using a trigger finger brace at night and taking Tylenol for the pain. She also reports occasional wrist pain.  Physical Exam MUSCULOSKELETAL: No finger swelling, tenderness about the PIP joints, nontender distal palmar crease, Finger flexion and extension, mild pain. Difficulty in finger straightening.  Assessment & Plan Hand Pain - concern for bilateral OA Morning stiffness and pain in both hands, particularly at the proximal interphalangeal joints. No locking or catching of fingers. Clinical presentation suggestive of osteoarthritis, but confirmation needed. -Order hand and wrist x-rays to confirm diagnosis and assess severity. -Advise patient to apply Voltaren gel 2-3 times daily for at least two weeks. -Continue Tylenol as needed for additional pain control. -Encourage gentle movement of hands throughout the day. -Consider steroid injection into the joint if pain persists despite topical treatment

## 2023-09-16 NOTE — Assessment & Plan Note (Signed)
See additional assessment(s) for plan details. 

## 2023-09-16 NOTE — Progress Notes (Signed)
     Primary Care / Sports Medicine Office Visit  Patient Information:  Patient ID: Christina Blackburn, female DOB: [birth year] Age: 77 y.o. MRN: [id number]   Christina Blackburn is a pleasant 77 y.o. female presenting with the following:  Chief Complaint  Patient presents with   Hand Pain    Patient presents for bil trigger finger in middle fingers. This has been going on for about 7 months now. She has been wearing splints at night on her middle fingers but her symptoms continue to get worse. Pain level is a 4/10 today. Patient takes tylenol for pain PRN.     Vitals:   09/16/23 1458  BP: 130/74  Pulse: 87  SpO2: 95%   Vitals:   09/16/23 1458  Weight: 149 lb 12.8 oz (67.9 kg)  Height: 4\' 11"  (1.499 m)   Body mass index is 30.26 kg/m.  No results found.   Independent interpretation of notes and tests performed by another provider:   None  Procedures performed:   None  Pertinent History, Exam, Impression, and Recommendations:   Problem List Items Addressed This Visit       Other   Bilateral wrist pain    See additional assessment(s) for plan details.      Relevant Orders   DG Wrist Complete Left   DG Wrist Complete Right   DG Hand Complete Left   DG Hand Complete Right   Bilateral hand pain - Primary    History of Present Illness Christina Blackburn presents with pain and stiffness in both hands, particularly in the middle fingers. The symptoms began earlier in the year. Initially, the patient experienced issues with one finger, which has since improved. However, the symptoms have now spread to other fingers. The patient reports that the pain and stiffness are worse in the mornings but improve as the day progresses and she uses her hands. The patient has been using a trigger finger brace at night and taking Tylenol for the pain. She also reports occasional wrist pain.  Physical Exam MUSCULOSKELETAL: No finger swelling, tenderness about the PIP joints, nontender distal  palmar crease, Finger flexion and extension, mild pain. Difficulty in finger straightening.  Assessment & Plan Hand Pain - concern for bilateral OA Morning stiffness and pain in both hands, particularly at the proximal interphalangeal joints. No locking or catching of fingers. Clinical presentation suggestive of osteoarthritis, but confirmation needed. -Order hand and wrist x-rays to confirm diagnosis and assess severity. -Advise patient to apply Voltaren gel 2-3 times daily for at least two weeks. -Continue Tylenol as needed for additional pain control. -Encourage gentle movement of hands throughout the day. -Consider steroid injection into the joint if pain persists despite topical treatment      Relevant Orders   DG Hand Complete Left   DG Hand Complete Right     Orders & Medications Medications: No orders of the defined types were placed in this encounter.  Orders Placed This Encounter  Procedures   DG Wrist Complete Left   DG Wrist Complete Right   DG Hand Complete Left   DG Hand Complete Right     No follow-ups on file.     Jerrol Banana, MD, [facility]   Primary Care Sports Medicine Primary Care and Sports Medicine at [facility]

## 2023-10-11 DIAGNOSIS — E785 Hyperlipidemia, unspecified: Secondary | ICD-10-CM | POA: Diagnosis not present

## 2023-10-11 DIAGNOSIS — I498 Other specified cardiac arrhythmias: Secondary | ICD-10-CM | POA: Diagnosis not present

## 2023-10-11 DIAGNOSIS — I4510 Unspecified right bundle-branch block: Secondary | ICD-10-CM | POA: Diagnosis not present

## 2023-10-11 DIAGNOSIS — I10 Essential (primary) hypertension: Secondary | ICD-10-CM | POA: Diagnosis not present

## 2023-10-11 DIAGNOSIS — I350 Nonrheumatic aortic (valve) stenosis: Secondary | ICD-10-CM | POA: Diagnosis not present

## 2023-10-11 DIAGNOSIS — I2510 Atherosclerotic heart disease of native coronary artery without angina pectoris: Secondary | ICD-10-CM | POA: Diagnosis not present

## 2023-10-11 DIAGNOSIS — R001 Bradycardia, unspecified: Secondary | ICD-10-CM | POA: Diagnosis not present

## 2023-10-11 DIAGNOSIS — Z8679 Personal history of other diseases of the circulatory system: Secondary | ICD-10-CM | POA: Diagnosis not present

## 2023-10-11 DIAGNOSIS — Z45018 Encounter for adjustment and management of other part of cardiac pacemaker: Secondary | ICD-10-CM | POA: Diagnosis not present

## 2023-10-11 DIAGNOSIS — I771 Stricture of artery: Secondary | ICD-10-CM | POA: Diagnosis not present

## 2023-10-11 DIAGNOSIS — I050 Rheumatic mitral stenosis: Secondary | ICD-10-CM | POA: Diagnosis not present

## 2023-10-11 DIAGNOSIS — Z950 Presence of cardiac pacemaker: Secondary | ICD-10-CM | POA: Diagnosis not present

## 2023-10-20 ENCOUNTER — Ambulatory Visit
Admission: EM | Admit: 2023-10-20 | Discharge: 2023-10-20 | Disposition: A | Discharge status: Home / Self Care | Payer: PPO | Attending: Emergency Medicine | Admitting: Emergency Medicine

## 2023-10-20 ENCOUNTER — Encounter: Payer: Self-pay | Admitting: Emergency Medicine

## 2023-10-20 DIAGNOSIS — T3695XA Adverse effect of unspecified systemic antibiotic, initial encounter: Secondary | ICD-10-CM | POA: Diagnosis not present

## 2023-10-20 DIAGNOSIS — N898 Other specified noninflammatory disorders of vagina: Secondary | ICD-10-CM | POA: Insufficient documentation

## 2023-10-20 DIAGNOSIS — N3001 Acute cystitis with hematuria: Secondary | ICD-10-CM | POA: Diagnosis not present

## 2023-10-20 DIAGNOSIS — B379 Candidiasis, unspecified: Secondary | ICD-10-CM | POA: Insufficient documentation

## 2023-10-20 DIAGNOSIS — R300 Dysuria: Secondary | ICD-10-CM | POA: Diagnosis not present

## 2023-10-20 LAB — WET PREP, GENITAL
Clue Cells Wet Prep HPF POC: NONE SEEN
Sperm: NONE SEEN
Trich, Wet Prep: NONE SEEN
WBC, Wet Prep HPF POC: <10 — AB (ref <10)
Yeast Wet Prep HPF POC: NONE SEEN

## 2023-10-20 LAB — URINALYSIS, W/ REFLEX TO CULTURE (INFECTION SUSPECTED)
Bilirubin Urine: NEGATIVE
Glucose, UA: 500 mg/dL — AB
Ketones, ur: NEGATIVE mg/dL
Nitrite: NEGATIVE
Protein, ur: NEGATIVE mg/dL
RBC / HPF: >50 RBC/hpf (ref 0–5)
Specific Gravity, Urine: 1.02 (ref 1.005–1.030)
WBC, UA: >50 WBC/hpf (ref 0–5)
pH: 6.5 (ref 5.0–8.0)

## 2023-10-20 MED ORDER — SULFAMETHOXAZOLE-TRIMETHOPRIM 800-160 MG PO TABS: 1.0000 | ORAL_TABLET | Freq: Two times a day (BID) | ORAL | 0 refills | Status: AC

## 2023-10-20 MED ORDER — FLUCONAZOLE 150 MG PO TABS: ORAL_TABLET | ORAL | 0 refills | Status: DC

## 2023-10-20 MED ORDER — PHENAZOPYRIDINE HCL 200 MG PO TABS: 200.0000 mg | ORAL_TABLET | Freq: Three times a day (TID) | ORAL | 0 refills | Status: DC

## 2023-10-20 NOTE — Discharge Instructions (Addendum)

## 2023-10-20 NOTE — ED Triage Notes (Signed)
 Patient c/o dysuria, vaginal discharge and urinary frequency that started 2 days ago.  Patient reports blood in her urine. Marland Kitchen

## 2023-10-20 NOTE — ED Provider Notes (Signed)
 [facility]    CSN: [id number] Arrival date & time: 10/20/23  0955      History   Chief Complaint Chief Complaint  Patient presents with   Dysuria   Vaginal Discharge    HPI Christina Blackburn is a 78 y.o. female with significant comorbidities.  She presents today for 2-day history of dysuria, vaginal irritation, frequency, urgency and hematuria.  Denies fever, fatigue, abdominal pain, flank pain, nausea/vomiting.  Believes she has a urinary tract infection.  Has been using Monistat with some relief of the vaginal irritation.  Urinary symptoms have not improved.  HPI  Past Medical History:  Diagnosis Date   Anemia    CHF (congestive heart failure) (HCC)    Chicken pox    Colon polyps    Complication of anesthesia    Coronary artery disease    Cystitis 1986   Diabetes mellitus without complication (HCC) 2001   Diverticulosis    Heart disease 2008   Heart murmur    Hyperlipidemia    Hypertension 2003   Pacemaker 08/12/2020   PAD (peripheral artery disease) (HCC)    PONV (postoperative nausea and vomiting)    Rotator cuff tendinitis    Squamous cell skin cancer 2016    Patient Active Problem List   Diagnosis Date Noted   Bilateral hand pain 09/16/2023   Bilateral wrist pain 09/16/2023   Thyroid  goiter 05/14/2023   Bilateral carotid artery stenosis 06/23/2021   Subclavian artery stenosis (HCC) 06/23/2021   Moderate mitral valve stenosis 06/23/2021   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/2020   Red blood cell antibody positive 08/16/2020   Cardiac pacemaker in situ 08/12/2020   Bradycardia 06/17/2020   RBBB (right bundle branch block) 09/24/2019   Dermatitis 03/23/2019   Atherosclerosis of native arteries of extremity with intermittent claudication (HCC) 11/12/2018   Obesity (BMI 30.0-34.9) 04/22/2018   Status post reverse total shoulder replacement, 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   Rotator cuff tendinitis, left 03/26/2018    Spondylosis of cervical region without myelopathy or radiculopathy 03/26/2018   PAD (peripheral artery disease) (HCC) 05/16/2017   Essential hypertension 05/16/2017   Hyperlipidemia associated with type 2 diabetes mellitus (HCC) 05/16/2017   Hx of cardiac cath 05/16/2015   Type II diabetes mellitus with complication (HCC) 03/25/2014   Coronary artery disease involving native coronary artery of native heart without angina pectoris 03/25/2014   S/P TAH-BSO 03/25/2014   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    Past Surgical History:  Procedure Laterality Date   ABDOMINAL HYSTERECTOMY  1989   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   APPLICATION OF WOUND VAC Bilateral 12/02/2018   Procedure: APPLICATION OF WOUND VAC;  Surgeon: Jama Cordella MATSU, MD;  Location: [facility] ORS;  Service: Vascular;  Laterality: Bilateral;   BREAST BIOPSY Left 2014   neg   BYPASS GRAFTING FEMORAL/ANTERIOR TIBIAL W/VEIN Right 11/07/2022   CARDIAC CATHETERIZATION     cornary stent   CESAREAN SECTION  1971, 1975   CHOLECYSTECTOMY  1987   COLONOSCOPY WITH PROPOFOL  N/A 09/22/2018   Procedure: COLONOSCOPY WITH PROPOFOL ;  Surgeon: Gaylyn Gladis PENNER, MD;  Location: [facility] ENDOSCOPY;  Service: Endoscopy;  Laterality: N/A;   COLONOSCOPY WITH PROPOFOL  N/A 12/15/2021   Procedure: COLONOSCOPY WITH PROPOFOL ;  Surgeon: Maryruth Ole DASEN, MD;  Location: [facility] ENDOSCOPY;  Service: Endoscopy;  Laterality: N/A;  DM, WHEELCHAIR   CORONARY ANGIOPLASTY  2008   stent x1   ENDARTERECTOMY FEMORAL Bilateral 11/12/2018   Procedure: ENDARTERECTOMY FEMORAL;  Surgeon: Jama Cordella MATSU, MD;  Location: [facility] ORS;  Service: Vascular;  Laterality: Bilateral;   INSERTION OF ILIAC STENT Bilateral 11/12/2018   Procedure: INSERTION OF ILIAC STENT;  Surgeon: Jama Cordella MATSU, MD;  Location: [facility] ORS;  Service: Vascular;  Laterality: Bilateral;   LOWER EXTREMITY ANGIOGRAPHY Left 08/05/2018   Procedure: LOWER EXTREMITY ANGIOGRAPHY;  Surgeon:  Jama Cordella MATSU, MD;  Location: [facility] INVASIVE CV LAB;  Service: Cardiovascular;  Laterality: Left;   REVERSE SHOULDER ARTHROPLASTY Right 04/08/2018   Procedure: REVERSE SHOULDER ARTHROPLASTY;  Surgeon: Edie Norleen PARAS, MD;  Location: [facility] ORS;  Service: Orthopedics;  Laterality: Right;   TUBAL LIGATION     WOUND DEBRIDEMENT Bilateral 12/02/2018   Procedure: DEBRIDEMENT WOUND;  Surgeon: Jama Cordella MATSU, MD;  Location: [facility] ORS;  Service: Vascular;  Laterality: Bilateral;    OB History     Gravida  2   Para  2   Term      Preterm      AB      Living  2      SAB      IAB      Ectopic      Multiple      Live Births           Obstetric Comments  Menstrual age: 89  Age 1st Pregnancy: 50          Home Medications    Prior to Admission medications   Medication Sig Start Date End Date Taking? Authorizing Provider  fluconazole  (DIFLUCAN ) 150 MG tablet Take 1 tab p.o. every 72 hours 10/20/23  Yes Arvis Huxley B, PA-C  phenazopyridine  (PYRIDIUM ) 200 MG tablet Take 1 tablet (200 mg total) by mouth 3 (three) times daily. 10/20/23  Yes Arvis Huxley B, PA-C  sulfamethoxazole -trimethoprim  (BACTRIM  DS) 800-160 MG tablet Take 1 tablet by mouth 2 (two) times daily for 7 days. 10/20/23 10/27/23 Yes Arvis Huxley NOVAK, PA-C  acetaminophen  (TYLENOL ) 325 MG tablet Take 2 tablets (650 mg total) by mouth every 6 (six) hours as needed for mild pain or headache (fever >/= 101). 10/08/19   Fausto Burnard LABOR, DO  atorvastatin  (LIPITOR) 20 MG tablet TAKE 2 TABLETS (40 MG TOTAL) BY MOUTH DAILY. 06/13/23   Justus Leita DEL, MD  Calcium  Carbonate-Vitamin D (CALCIUM  600+D PO) Take 1 tablet by mouth 2 (two) times daily.    [provider]  carvedilol (COREG) 3.125 MG tablet Take 3.125 mg by mouth 2 (two) times daily with a meal.    [provider]  cholecalciferol  (VITAMIN D) 25 MCG (1000 UT) tablet Take 1 tablet (1,000 Units total) by mouth daily. 10/09/19   Fausto Burnard A, DO   clopidogrel  (PLAVIX ) 75 MG tablet Take 1 tablet (75 mg total) by mouth daily. 05/14/23   Justus Leita DEL, MD  Continuous Glucose Sensor (FREESTYLE LIBRE 3 SENSOR) MISC 1 each by Does not apply route continuous. Place 1 sensor on the skin every 14 days. Use to check glucose continuously 05/14/23   Justus Leita DEL, MD  docusate sodium  (COLACE) 100 MG capsule Take 100 mg by mouth daily.    [provider]  DULoxetine  (CYMBALTA ) 30 MG capsule Take 1 capsule (30 mg total) by mouth daily. 05/14/23   Justus Leita DEL, MD  empagliflozin  (JARDIANCE ) 25 MG TABS tablet Take by mouth daily.    [provider]  fenofibrate  (TRICOR ) 145 MG tablet Take 1 tablet (145 mg total) by mouth daily. 05/14/23   Justus Leita DEL,  MD  ferrous sulfate 324 MG TBEC Take 324 mg by mouth.    [provider]  furosemide  (LASIX ) 20 MG tablet Take 20 mg by mouth daily as needed for fluid.     [provider]  gabapentin  (NEURONTIN ) 300 MG capsule Take 300 mg by mouth at bedtime.    [provider]  glimepiride  (AMARYL ) 4 MG tablet Take 1 tablet (4 mg total) by mouth daily with breakfast. 05/14/23   Justus Leita DEL, MD  magnesium  oxide (MAG-OX) 400 MG tablet Take 400 mg by mouth daily.    [provider]  Multiple Vitamin (MULTIVITAMIN WITH MINERALS) TABS tablet Take 1 tablet by mouth daily. 10/09/19   Fausto Burnard LABOR, DO  senna-docusate (SENOKOT-S) 8.6-50 MG tablet Take 1 tablet by mouth at bedtime as needed for mild constipation. 10/08/19   Fausto Burnard LABOR, DO  spironolactone (ALDACTONE) 25 MG tablet Take 12.5 mg by mouth daily.    [provider]  vitamin B-12 (CYANOCOBALAMIN ) 1000 MCG tablet Take 1,000 mcg by mouth daily.    [provider]  vitamin E  400 UNIT capsule Take 400 Units by mouth daily.     [provider]  zinc  sulfate 220 (50 Zn) MG capsule Take 1 capsule (220 mg total) by mouth daily. 10/09/19   Fausto Burnard LABOR, DO     Family History Family History  Problem Relation Age of Onset   Colon cancer Cousin 29   Lymphoma Brother 62   Lung cancer Mother    Hypertension Father    Kidney failure Father    Breast cancer Neg Hx     Social History Social History   Tobacco Use   Smoking status: Former    Current packs/day: 0.00    Types: Cigarettes    Start date: 2002    Quit date: 2008    Years since quitting: 17.0   Smokeless tobacco: Never  Vaping Use   Vaping status: Never Used  Substance Use Topics   Alcohol use: Yes    Comment: rare   Drug use: No     Allergies   Tape   Review of Systems Review of Systems  Constitutional:  Negative for chills, fatigue and fever.  Gastrointestinal:  Negative for abdominal pain, diarrhea, nausea and vomiting.  Genitourinary:  Positive for dysuria, frequency, hematuria and urgency. Negative for decreased urine volume, flank pain, pelvic pain, vaginal bleeding, vaginal discharge and vaginal pain.  Musculoskeletal:  Negative for back pain.  Skin:  Negative for rash.     Physical Exam Triage Vital Signs ED Triage Vitals  Encounter Vitals Group     BP 10/20/23 1150 (!) 150/77     Systolic BP Percentile --      Diastolic BP Percentile --      Pulse Rate 10/20/23 1150 90     Resp 10/20/23 1150 14     Temp 10/20/23 1150 98.5 F (36.9 C)     Temp Source 10/20/23 1150 Oral     SpO2 10/20/23 1150 95 %     Weight 10/20/23 1149 149 lb 11.1 oz (67.9 kg)     Height 10/20/23 1149 4' 11 (1.499 m)     Head Circumference --      Peak Flow --      Pain Score 10/20/23 1149 0     Pain Loc --      Pain Education --      Exclude from Growth Chart --    No data  found.  Updated Vital Signs BP (!) 150/77 (BP Location: Left Arm) Comment: Patient has not taken BP medicine today  Pulse 90   Temp 98.5 F (36.9 C) (Oral)   Resp 14   Ht 4' 11 (1.499 m)   Wt 149 lb 11.1 oz (67.9 kg)   SpO2 95%   BMI 30.23 kg/m      Physical Exam Vitals and nursing  note reviewed.  Constitutional:      General: She is not in acute distress.    Appearance: Normal appearance. She is not ill-appearing or toxic-appearing.  HENT:     Head: Normocephalic and atraumatic.  Eyes:     General: No scleral icterus.       Right eye: No discharge.        Left eye: No discharge.     Conjunctiva/sclera: Conjunctivae normal.  Cardiovascular:     Rate and Rhythm: Normal rate and regular rhythm.  Pulmonary:     Effort: Pulmonary effort is normal. No respiratory distress.     Breath sounds: Normal breath sounds.  Abdominal:     Palpations: Abdomen is soft.     Tenderness: There is no abdominal tenderness.  Musculoskeletal:     Cervical back: Neck supple.  Skin:    General: Skin is dry.  Neurological:     General: No focal deficit present.     Mental Status: She is alert. Mental status is at baseline.     Motor: No weakness.  Psychiatric:        Mood and Affect: Mood normal.        Behavior: Behavior normal.      UC Treatments / Results  Labs (all labs ordered are listed, but only abnormal results are displayed) Labs Reviewed  WET PREP, GENITAL - Abnormal; Notable for the following components:      Result Value   WBC, Wet Prep HPF POC <10 (*)    All other components within normal limits  URINALYSIS, W/ REFLEX TO CULTURE (INFECTION SUSPECTED) - Abnormal; Notable for the following components:   APPearance HAZY (*)    Glucose, UA 500 (*)    Hgb urine dipstick LARGE (*)    Leukocytes,Ua SMALL (*)    Bacteria, UA FEW (*)    All other components within normal limits  URINE CULTURE    EKG   Radiology No results found.  Procedures Procedures (including critical care time)  Medications Ordered in UC Medications - No data to display  Initial Impression / Assessment and Plan / UC Course  I have reviewed the triage vital signs and the nursing notes.  Pertinent labs & imaging results that were available during my care of the patient were  reviewed by me and considered in my medical decision making (see chart for details).   78 year old female with history of significant comorbidities presents for 2-day history of urinary frequency/urgency, dysuria, hematuria, vaginal irritation and itching.  Has used Monistat and been drinking cranberry juice.  Urinary symptoms did not improve.  Afebrile and overall well-appearing.  Negative wet prep.  Urine shows hazy urine with glucose, large hemoglobin, small leukocytes, bacteria and white blood cell clumps.  Will send urine for culture.  Acute urinary tract infection.  May have vulvovaginitis.  Treating at this time with Bactrim  DS, Pyridium  and Diflucan .  Encouraged increasing rest and fluids.  Will amend treatment based on culture result.   Final Clinical Impressions(s) / UC Diagnoses   Final diagnoses:  Acute cystitis with hematuria  Dysuria  Antibiotic-induced yeast infection     Discharge Instructions      UTI: Based on either symptoms or urinalysis, you may have a urinary tract infection. We will send the urine for culture and call with results in a few days. Begin antibiotics at this time. Your symptoms should be much improved over the next 2-3 days. Increase rest and fluid intake. If for some reason symptoms are worsening or not improving after a couple of days or the urine culture determines the antibiotics you are taking will not treat the infection, the antibiotics may be changed. Return or go to ER for fever, back pain, worsening urinary pain, discharge, increased blood in urine. May take Tylenol  or Motrin OTC for pain relief or consider AZO if no contraindications    ED Prescriptions     Medication Sig Dispense Auth. Provider   sulfamethoxazole -trimethoprim  (BACTRIM  DS) 800-160 MG tablet Take 1 tablet by mouth 2 (two) times daily for 7 days. 14 tablet Arvis Huxley B, PA-C   phenazopyridine  (PYRIDIUM ) 200 MG tablet Take 1 tablet (200 mg total) by mouth 3 (three) times  daily. 6 tablet Arvis Huxley NOVAK, PA-C   fluconazole  (DIFLUCAN ) 150 MG tablet Take 1 tab p.o. every 72 hours 2 tablet Arvis Huxley NOVAK, PA-C      PDMP not reviewed this encounter.   Arvis Huxley NOVAK, PA-C 10/20/23 1237

## 2023-10-22 LAB — URINE CULTURE

## 2023-11-17 ENCOUNTER — Other Ambulatory Visit: Payer: Self-pay | Admitting: Internal Medicine

## 2023-11-17 DIAGNOSIS — E118 Type 2 diabetes mellitus with unspecified complications: Secondary | ICD-10-CM

## 2023-11-18 NOTE — Telephone Encounter (Signed)
Requested medication (s) are due for refill today - yes  Requested medication (s) are on the active medication list -yes  Future visit scheduled -no  Last refill: 05/14/23 2 each 5RF  Notes to clinic: no protocol assigned- sent for review   Requested Prescriptions  Pending Prescriptions Disp Refills   Continuous Glucose Sensor (FREESTYLE LIBRE 3 PLUS SENSOR) MISC [Pharmacy Med Name: FREESTYLE LIBRE 3 PLUS SENSOR] 2 each 5    Sig: PLACE 1 SENSOR ON THE SKIN EVERY 14 DAYS.     There is no refill protocol information for this order       Requested Prescriptions  Pending Prescriptions Disp Refills   Continuous Glucose Sensor (FREESTYLE LIBRE 3 PLUS SENSOR) MISC [Pharmacy Med Name: FREESTYLE LIBRE 3 PLUS SENSOR] 2 each 5    Sig: PLACE 1 SENSOR ON THE SKIN EVERY 14 DAYS.     There is no refill protocol information for this order

## 2023-12-08 ENCOUNTER — Other Ambulatory Visit: Payer: Self-pay | Admitting: Internal Medicine

## 2023-12-10 ENCOUNTER — Other Ambulatory Visit: Payer: Self-pay | Admitting: Internal Medicine

## 2023-12-10 DIAGNOSIS — E1169 Type 2 diabetes mellitus with other specified complication: Secondary | ICD-10-CM

## 2023-12-10 DIAGNOSIS — I771 Stricture of artery: Secondary | ICD-10-CM

## 2023-12-10 NOTE — Telephone Encounter (Signed)
 Requested Prescriptions  Pending Prescriptions Disp Refills   DULoxetine (CYMBALTA) 30 MG capsule [Pharmacy Med Name: DULOXETINE HCL DR 30 MG CAP] 90 capsule 0    Sig: TAKE 1 CAPSULE BY MOUTH EVERY DAY     Psychiatry: Antidepressants - SNRI - duloxetine Failed - 12/10/2023  8:45 AM      Failed - Last BP in normal range    BP Readings from Last 1 Encounters:  10/20/23 (!) 150/77         Passed - Cr in normal range and within 360 days    Creatinine, Ser  Date Value Ref Range Status  05/14/2023 0.89 0.57 - 1.00 mg/dL Final         Passed - eGFR is 30 or above and within 360 days    GFR calc Af Amer  Date Value Ref Range Status  10/17/2019 >60 >60 mL/min Final   GFR calc non Af Amer  Date Value Ref Range Status  10/17/2019 >60 >60 mL/min Final   eGFR  Date Value Ref Range Status  05/14/2023 68 >59 mL/min/1.73 Final         Passed - Completed PHQ-2 or PHQ-9 in the last 360 days      Passed - Valid encounter within last 6 months    Recent Outpatient Visits           2 months ago Bilateral hand pain   [place] [facility] at [facility], Ocie Bob, MD   3 months ago Type II diabetes mellitus with complication [facility])   [place] [facility] at [facility], Nyoka Cowden, MD   7 months ago Type II diabetes mellitus with complication [facility])   [place] [facility] at [facility], Nyoka Cowden, MD              Patient will need to schedule an office visit for additional refills.

## 2023-12-11 NOTE — Telephone Encounter (Signed)
 Requested medication (s) are due for refill today: Yes  Requested medication (s) are on the active medication list: Yes  Last refill:  05/14/23  Future visit scheduled: Yes  Notes to clinic:  Protocols indicate lab work needed.    Requested Prescriptions  Pending Prescriptions Disp Refills   clopidogrel (PLAVIX) 75 MG tablet [Pharmacy Med Name: CLOPIDOGREL 75 MG TABLET] 90 tablet 1    Sig: TAKE 1 TABLET BY MOUTH EVERY DAY     Hematology: Antiplatelets - clopidogrel Failed - 12/11/2023  8:47 AM      Failed - HCT in normal range and within 180 days    HCT  Date Value Ref Range Status  10/17/2019 37.5 36.0 - 46.0 % Final         Failed - HGB in normal range and within 180 days    Hemoglobin  Date Value Ref Range Status  10/17/2019 11.9 (L) 12.0 - 15.0 g/dL Final         Failed - PLT in normal range and within 180 days    Platelets  Date Value Ref Range Status  10/17/2019 410 (H) 150 - 400 K/uL Final         Passed - Cr in normal range and within 360 days    Creatinine, Ser  Date Value Ref Range Status  05/14/2023 0.89 0.57 - 1.00 mg/dL Final         Passed - Valid encounter within last 6 months    Recent Outpatient Visits           2 months ago Bilateral hand pain   [place] [facility], Ocie Bob, MD   3 months ago Type II diabetes mellitus with complication [facility])   [place] [facility] at [facility], Nyoka Cowden, MD   7 months ago Type II diabetes mellitus with complication [facility])   [place] [facility] at [facility], Nyoka Cowden, MD               fenofibrate (TRICOR) 145 MG tablet [Pharmacy Med Name: FENOFIBRATE 145 MG TABLET] 90 tablet 1    Sig: TAKE 1 TABLET BY MOUTH EVERY DAY     Cardiovascular:  Antilipid - Fibric Acid Derivatives Failed - 12/11/2023  8:47 AM      Failed - HGB in normal range and within 360 days    Hemoglobin  Date Value  Ref Range Status  10/17/2019 11.9 (L) 12.0 - 15.0 g/dL Final         Failed - HCT in normal range and within 360 days    HCT  Date Value Ref Range Status  10/17/2019 37.5 36.0 - 46.0 % Final         Failed - PLT in normal range and within 360 days    Platelets  Date Value Ref Range Status  10/17/2019 410 (H) 150 - 400 K/uL Final         Failed - WBC in normal range and within 360 days    WBC  Date Value Ref Range Status  10/17/2019 7.6 4.0 - 10.5 K/uL Final         Failed - Lipid Panel in normal range within the last 12 months    Cholesterol, Total  Date Value Ref Range Status  05/14/2023 152 100 - 199 mg/dL Final   LDL Chol Calc (NIH)  Date Value Ref Range Status  05/14/2023  73 0 - 99 mg/dL Final   HDL  Date Value Ref Range Status  05/14/2023 38 (L) >39 mg/dL Final   Triglycerides  Date Value Ref Range Status  05/14/2023 254 (H) 0 - 149 mg/dL Final         Passed - ALT in normal range and within 360 days    ALT  Date Value Ref Range Status  05/14/2023 23 0 - 32 IU/L Final         Passed - AST in normal range and within 360 days    AST  Date Value Ref Range Status  05/14/2023 26 0 - 40 IU/L Final         Passed - Cr in normal range and within 360 days    Creatinine, Ser  Date Value Ref Range Status  05/14/2023 0.89 0.57 - 1.00 mg/dL Final         Passed - eGFR is 30 or above and within 360 days    GFR calc Af Amer  Date Value Ref Range Status  10/17/2019 >60 >60 mL/min Final   GFR calc non Af Amer  Date Value Ref Range Status  10/17/2019 >60 >60 mL/min Final   eGFR  Date Value Ref Range Status  05/14/2023 68 >59 mL/min/1.73 Final         Passed - Valid encounter within last 12 months    Recent Outpatient Visits           2 months ago Bilateral hand pain   [place] [facility], Ocie Bob, MD   3 months ago Type II diabetes mellitus with complication [facility])   [place] [facility] at [facility], Nyoka Cowden, MD   7 months ago Type II diabetes mellitus with complication [facility])   [place] [facility] at [facility], Nyoka Cowden, MD

## 2023-12-13 ENCOUNTER — Other Ambulatory Visit: Payer: Self-pay | Admitting: Internal Medicine

## 2023-12-13 DIAGNOSIS — E118 Type 2 diabetes mellitus with unspecified complications: Secondary | ICD-10-CM

## 2023-12-16 NOTE — Telephone Encounter (Signed)
 Requested Prescriptions  Pending Prescriptions Disp Refills   glimepiride (AMARYL) 4 MG tablet [Pharmacy Med Name: GLIMEPIRIDE 4 MG TABLET] 90 tablet 1    Sig: TAKE 1 TABLET BY MOUTH DAILY WITH BREAKFAST     Endocrinology:  Diabetes - Sulfonylureas Passed - 12/16/2023 12:51 PM      Passed - HBA1C is between 0 and 7.9 and within 180 days    Hemoglobin A1C  Date Value Ref Range Status  09/04/2023 5.8 (A) 4.0 - 5.6 % Final   Hgb A1c MFr Bld  Date Value Ref Range Status  05/14/2023 6.0 (H) 4.8 - 5.6 % Final    Comment:             Prediabetes: 5.7 - 6.4          Diabetes: >6.4          Glycemic control for adults with diabetes: <7.0          Passed - Cr in normal range and within 360 days    Creatinine, Ser  Date Value Ref Range Status  05/14/2023 0.89 0.57 - 1.00 mg/dL Final         Passed - Valid encounter within last 6 months    Recent Outpatient Visits           3 months ago Bilateral hand pain   [place] [facility], Ocie Bob, MD   3 months ago Type II diabetes mellitus with complication [facility])   [place] [facility] at [facility], Nyoka Cowden, MD   7 months ago Type II diabetes mellitus with complication [facility])   [place] [facility] at [facility], Nyoka Cowden, MD

## 2024-01-11 DIAGNOSIS — Z45018 Encounter for adjustment and management of other part of cardiac pacemaker: Secondary | ICD-10-CM | POA: Diagnosis not present

## 2024-01-11 DIAGNOSIS — E785 Hyperlipidemia, unspecified: Secondary | ICD-10-CM | POA: Diagnosis not present

## 2024-01-11 DIAGNOSIS — I10 Essential (primary) hypertension: Secondary | ICD-10-CM | POA: Diagnosis not present

## 2024-01-11 DIAGNOSIS — I771 Stricture of artery: Secondary | ICD-10-CM | POA: Diagnosis not present

## 2024-01-11 DIAGNOSIS — I350 Nonrheumatic aortic (valve) stenosis: Secondary | ICD-10-CM | POA: Diagnosis not present

## 2024-01-11 DIAGNOSIS — I4510 Unspecified right bundle-branch block: Secondary | ICD-10-CM | POA: Diagnosis not present

## 2024-01-11 DIAGNOSIS — Z8679 Personal history of other diseases of the circulatory system: Secondary | ICD-10-CM | POA: Diagnosis not present

## 2024-01-11 DIAGNOSIS — R001 Bradycardia, unspecified: Secondary | ICD-10-CM | POA: Diagnosis not present

## 2024-01-11 DIAGNOSIS — I2510 Atherosclerotic heart disease of native coronary artery without angina pectoris: Secondary | ICD-10-CM | POA: Diagnosis not present

## 2024-01-11 DIAGNOSIS — Z950 Presence of cardiac pacemaker: Secondary | ICD-10-CM | POA: Diagnosis not present

## 2024-01-11 DIAGNOSIS — I498 Other specified cardiac arrhythmias: Secondary | ICD-10-CM | POA: Diagnosis not present

## 2024-01-11 DIAGNOSIS — I050 Rheumatic mitral stenosis: Secondary | ICD-10-CM | POA: Diagnosis not present

## 2024-02-25 DIAGNOSIS — I779 Disorder of arteries and arterioles, unspecified: Secondary | ICD-10-CM | POA: Diagnosis not present

## 2024-02-25 DIAGNOSIS — Z89612 Acquired absence of left leg above knee: Secondary | ICD-10-CM | POA: Diagnosis not present

## 2024-02-25 DIAGNOSIS — Z95828 Presence of other vascular implants and grafts: Secondary | ICD-10-CM | POA: Diagnosis not present

## 2024-02-27 DIAGNOSIS — Z45018 Encounter for adjustment and management of other part of cardiac pacemaker: Secondary | ICD-10-CM | POA: Diagnosis not present

## 2024-02-27 DIAGNOSIS — E785 Hyperlipidemia, unspecified: Secondary | ICD-10-CM | POA: Diagnosis not present

## 2024-02-27 DIAGNOSIS — R001 Bradycardia, unspecified: Secondary | ICD-10-CM | POA: Diagnosis not present

## 2024-02-27 DIAGNOSIS — Z8679 Personal history of other diseases of the circulatory system: Secondary | ICD-10-CM | POA: Diagnosis not present

## 2024-02-27 DIAGNOSIS — I498 Other specified cardiac arrhythmias: Secondary | ICD-10-CM | POA: Diagnosis not present

## 2024-02-27 DIAGNOSIS — I350 Nonrheumatic aortic (valve) stenosis: Secondary | ICD-10-CM | POA: Diagnosis not present

## 2024-02-27 DIAGNOSIS — I4510 Unspecified right bundle-branch block: Secondary | ICD-10-CM | POA: Diagnosis not present

## 2024-02-27 DIAGNOSIS — I050 Rheumatic mitral stenosis: Secondary | ICD-10-CM | POA: Diagnosis not present

## 2024-02-27 DIAGNOSIS — Z950 Presence of cardiac pacemaker: Secondary | ICD-10-CM | POA: Diagnosis not present

## 2024-02-27 DIAGNOSIS — I10 Essential (primary) hypertension: Secondary | ICD-10-CM | POA: Diagnosis not present

## 2024-02-27 DIAGNOSIS — I442 Atrioventricular block, complete: Secondary | ICD-10-CM | POA: Diagnosis not present

## 2024-02-27 DIAGNOSIS — I771 Stricture of artery: Secondary | ICD-10-CM | POA: Diagnosis not present

## 2024-02-27 DIAGNOSIS — I2510 Atherosclerotic heart disease of native coronary artery without angina pectoris: Secondary | ICD-10-CM | POA: Diagnosis not present

## 2024-03-09 ENCOUNTER — Other Ambulatory Visit: Payer: Self-pay | Admitting: Internal Medicine

## 2024-03-09 DIAGNOSIS — E785 Hyperlipidemia, unspecified: Secondary | ICD-10-CM

## 2024-03-09 DIAGNOSIS — E1169 Type 2 diabetes mellitus with other specified complication: Secondary | ICD-10-CM

## 2024-03-09 DIAGNOSIS — I771 Stricture of artery: Secondary | ICD-10-CM

## 2024-03-12 NOTE — Telephone Encounter (Signed)
 Please review request

## 2024-03-12 NOTE — Telephone Encounter (Signed)
 Requested medication (s) are due for refill today: yes  Requested medication (s) are on the active medication list: yes  Last refill:  12/11/23 #90/0  Future visit scheduled: no  Notes to clinic:  Unable to refill per protocol due to failed labs, no updated results.      Requested Prescriptions  Pending Prescriptions Disp Refills   fenofibrate  (TRICOR ) 145 MG tablet [Pharmacy Med Name: FENOFIBRATE  145 MG TABLET] 90 tablet 0    Sig: TAKE 1 TABLET BY MOUTH EVERY DAY     Cardiovascular:  Antilipid - Fibric Acid Derivatives Failed - 03/12/2024  9:25 AM      Failed - HGB in normal range and within 360 days    Hemoglobin  Date Value Ref Range Status  10/17/2019 11.9 (L) 12.0 - 15.0 g/dL Final         Failed - HCT in normal range and within 360 days    HCT  Date Value Ref Range Status  10/17/2019 37.5 36.0 - 46.0 % Final         Failed - PLT in normal range and within 360 days    Platelets  Date Value Ref Range Status  10/17/2019 410 (H) 150 - 400 K/uL Final         Failed - WBC in normal range and within 360 days    WBC  Date Value Ref Range Status  10/17/2019 7.6 4.0 - 10.5 K/uL Final         Failed - Valid encounter within last 12 months    Recent Outpatient Visits   None            Failed - Lipid Panel in normal range within the last 12 months    Cholesterol, Total  Date Value Ref Range Status  05/14/2023 152 100 - 199 mg/dL Final   LDL Chol Calc (NIH)  Date Value Ref Range Status  05/14/2023 73 0 - 99 mg/dL Final   HDL  Date Value Ref Range Status  05/14/2023 38 (L) >39 mg/dL Final   Triglycerides  Date Value Ref Range Status  05/14/2023 254 (H) 0 - 149 mg/dL Final         Passed - ALT in normal range and within 360 days    ALT  Date Value Ref Range Status  05/14/2023 23 0 - 32 IU/L Final         Passed - AST in normal range and within 360 days    AST  Date Value Ref Range Status  05/14/2023 26 0 - 40 IU/L Final         Passed - Cr in  normal range and within 360 days    Creatinine, Ser  Date Value Ref Range Status  05/14/2023 0.89 0.57 - 1.00 mg/dL Final         Passed - eGFR is 30 or above and within 360 days    GFR calc Af Amer  Date Value Ref Range Status  10/17/2019 >60 >60 mL/min Final   GFR calc non Af Amer  Date Value Ref Range Status  10/17/2019 >60 >60 mL/min Final   eGFR  Date Value Ref Range Status  05/14/2023 68 >59 mL/min/1.73 Final          clopidogrel  (PLAVIX ) 75 MG tablet [Pharmacy Med Name: CLOPIDOGREL  75 MG TABLET] 90 tablet 0    Sig: TAKE 1 TABLET BY MOUTH EVERY DAY     Hematology: Antiplatelets - clopidogrel  Failed - 03/12/2024  9:25 AM  Failed - HCT in normal range and within 180 days    HCT  Date Value Ref Range Status  10/17/2019 37.5 36.0 - 46.0 % Final         Failed - HGB in normal range and within 180 days    Hemoglobin  Date Value Ref Range Status  10/17/2019 11.9 (L) 12.0 - 15.0 g/dL Final         Failed - PLT in normal range and within 180 days    Platelets  Date Value Ref Range Status  10/17/2019 410 (H) 150 - 400 K/uL Final         Failed - Valid encounter within last 6 months    Recent Outpatient Visits   None            Passed - Cr in normal range and within 360 days    Creatinine, Ser  Date Value Ref Range Status  05/14/2023 0.89 0.57 - 1.00 mg/dL Final

## 2024-03-14 ENCOUNTER — Other Ambulatory Visit: Payer: Self-pay | Admitting: Internal Medicine

## 2024-03-30 ENCOUNTER — Encounter: Payer: Self-pay | Admitting: Family Medicine

## 2024-03-30 ENCOUNTER — Ambulatory Visit (INDEPENDENT_AMBULATORY_CARE_PROVIDER_SITE_OTHER): Admitting: Family Medicine

## 2024-03-30 VITALS — BP 134/72 | HR 74 | Ht 59.0 in

## 2024-03-30 DIAGNOSIS — R234 Changes in skin texture: Secondary | ICD-10-CM | POA: Diagnosis not present

## 2024-03-30 NOTE — Progress Notes (Signed)
   Acute Office Visit  Subjective:     Patient ID: Christina Blackburn, female    DOB: 1945-11-29, 78 y.o.   MRN: [id number]  Chief Complaint  Patient presents with   Abrasion    Patient states that she noticed a scab on right her arm when she removed her fre style libre meter and is tender and red. She placed it herself and removed it herself, has now move it over to her left arm    Christina Blackburn 78 y/o female here with her grand daughter presents to the clinic due to concerns of lesion which she noticed after removing her free style sensor. Location right under arm area. States there is a scab now but it was like a pimple before. She noticed it over the weekend. Has been using CGM for over an year, states her CMG last only 6 days instead of 14 days. She wants to try dexcom.       Review of Systems  All other systems reviewed and are negative.       Objective:    BP 134/72   Pulse 74   Ht 4' 11 (1.499 m)   SpO2 98%   BMI 30.23 kg/m    Physical Exam Vitals and nursing note reviewed.  Constitutional:      Appearance: Normal appearance.  HENT:     Head: Normocephalic.     Right Ear: External ear normal.     Left Ear: External ear normal.   Eyes:     Conjunctiva/sclera: Conjunctivae normal.    Cardiovascular:     Rate and Rhythm: Normal rate.  Pulmonary:     Effort: Pulmonary effort is normal. No respiratory distress.  Abdominal:     Palpations: Abdomen is soft.   Musculoskeletal:        General: Normal range of motion.   Skin:    General: Skin is warm.     Findings: Lesion present.   Neurological:     Mental Status: She is alert and oriented to person, place, and time.   Psychiatric:        Mood and Affect: Mood normal.     No results found for any visits on 03/30/24.      Assessment & Plan:   Problem List Items Addressed This Visit   None Visit Diagnoses       Scab    -  Primary     Small erythematous lesion with dark dried up scab in  center with some local induration over her right under arm, unlikely abscess. Healing well. Recommend to use topical antibiotic cream daily for few days. Few samples of bacitracin given to the patient.   No orders of the defined types were placed in this encounter.   No follow-ups on file.  Vinary K Dondra Rhett, MD

## 2024-04-11 ENCOUNTER — Ambulatory Visit
Admission: EM | Admit: 2024-04-11 | Discharge: 2024-04-11 | Disposition: A | Discharge status: Home / Self Care | Attending: Emergency Medicine | Admitting: Emergency Medicine

## 2024-04-11 DIAGNOSIS — M546 Pain in thoracic spine: Secondary | ICD-10-CM

## 2024-04-11 MED ORDER — BACLOFEN 10 MG PO TABS: 10.0000 mg | ORAL_TABLET | Freq: Three times a day (TID) | ORAL | 0 refills | Status: DC

## 2024-04-11 MED ORDER — ONDANSETRON 4 MG PO TBDP: 4.0000 mg | ORAL_TABLET | Freq: Three times a day (TID) | ORAL | 0 refills | Status: DC | PRN

## 2024-04-11 MED ORDER — ONDANSETRON 4 MG PO TBDP
4.0000 mg | ORAL_TABLET | Freq: Once | ORAL | Status: AC
  Administered 2024-04-11: 4 mg via ORAL

## 2024-04-11 MED ORDER — HYDROCODONE-ACETAMINOPHEN 5-325 MG PO TABS: 1.0000 | ORAL_TABLET | Freq: Four times a day (QID) | ORAL | 0 refills | Status: DC | PRN

## 2024-04-11 MED ORDER — PREDNISONE 10 MG (21) PO TBPK: ORAL_TABLET | ORAL | 0 refills | Status: DC

## 2024-04-11 MED ORDER — DEXAMETHASONE SODIUM PHOSPHATE 10 MG/ML IJ SOLN
10.0000 mg | Freq: Once | INTRAMUSCULAR | Status: AC
  Administered 2024-04-11: 10 mg via INTRAMUSCULAR

## 2024-04-11 NOTE — ED Provider Notes (Signed)
 [facility]    CSN: [id number] Arrival date & time: 04/11/24  0810      History   Chief Complaint Chief Complaint  Patient presents with   Nausea   Spasms    HPI Christina Blackburn is a 78 y.o. female.   HPI  78 year old female with past medical history significant for peripheral artery disease, type 2 diabetes, essential hypertension, hyperlipidemia, right bundle branch block, and left AKA presents for evaluation of left upper back pain that has been going on for the last 3 days.  She is reporting as a constant, gripping, 10/10 pain that increases with movement and deep breathing.  She has used ice, heat, ibuprofen, Tylenol , and gabapentin  without relief.  She denies any injury or urinary symptoms.  No history of stones.  Also no shortness of breath.  She does manually transfer between her wheelchair and bed and her wheelchair and other chairs without the assistance of a lift.  She also uses her left arm predominantly to reach for objects.  Past Medical History:  Diagnosis Date   Anemia    CHF (congestive heart failure) (HCC)    Chicken pox    Colon polyps    Complication of anesthesia    Coronary artery disease    Cystitis 1986   Diabetes mellitus without complication (HCC) 2001   Diverticulosis    Heart disease 2008   Heart murmur    Hyperlipidemia    Hypertension 2003   Pacemaker 08/12/2020   PAD (peripheral artery disease) (HCC)    PONV (postoperative nausea and vomiting)    Rotator cuff tendinitis    Squamous cell skin cancer 2016    Patient Active Problem List   Diagnosis Date Noted   Bilateral hand pain 09/16/2023   Bilateral wrist pain 09/16/2023   Thyroid  goiter 05/14/2023   Bilateral carotid artery stenosis 06/23/2021   Subclavian artery stenosis (HCC) 06/23/2021   Moderate mitral valve stenosis 06/23/2021   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/2020   Red blood cell antibody positive 08/16/2020   Cardiac  pacemaker in situ 08/12/2020   Bradycardia 06/17/2020   RBBB (right bundle branch block) 09/24/2019   Dermatitis 03/23/2019   Atherosclerosis of native arteries of extremity with intermittent claudication (HCC) 11/12/2018   Obesity (BMI 30.0-34.9) 04/22/2018   Status post reverse total shoulder replacement, 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   Rotator cuff tendinitis, left 03/26/2018   Spondylosis of cervical region without myelopathy or radiculopathy 03/26/2018   PAD (peripheral artery disease) (HCC) 05/16/2017   Essential hypertension 05/16/2017   Hyperlipidemia associated with type 2 diabetes mellitus (HCC) 05/16/2017   Hx of cardiac cath 05/16/2015   Type II diabetes mellitus with complication (HCC) 03/25/2014   Coronary artery disease involving native coronary artery of native heart without angina pectoris 03/25/2014   S/P TAH-BSO 03/25/2014   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    Past Surgical History:  Procedure Laterality Date   ABDOMINAL HYSTERECTOMY  1989   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   APPLICATION OF WOUND VAC Bilateral 12/02/2018   Procedure: APPLICATION OF WOUND VAC;  Surgeon: Jama Cordella MATSU, MD;  Location: [facility];  Service: Vascular;  Laterality: Bilateral;   BREAST BIOPSY Left 2014   neg   BYPASS GRAFTING FEMORAL/ANTERIOR TIBIAL W/VEIN Right 11/07/2022   CARDIAC CATHETERIZATION     cornary stent   CESAREAN SECTION  1971, 1975   CHOLECYSTECTOMY  1987   COLONOSCOPY WITH PROPOFOL  N/A 09/22/2018   Procedure:  COLONOSCOPY WITH PROPOFOL ;  Surgeon: Gaylyn Gladis PENNER, MD;  Location: [facility] ENDOSCOPY;  Service: Endoscopy;  Laterality: N/A;   COLONOSCOPY WITH PROPOFOL  N/A 12/15/2021   Procedure: COLONOSCOPY WITH PROPOFOL ;  Surgeon: Maryruth Ole DASEN, MD;  Location: [facility] ENDOSCOPY;  Service: Endoscopy;  Laterality: N/A;  DM, WHEELCHAIR   CORONARY ANGIOPLASTY  2008   stent x1   ENDARTERECTOMY FEMORAL Bilateral 11/12/2018   Procedure: ENDARTERECTOMY FEMORAL;  Surgeon:  Jama Cordella MATSU, MD;  Location: [facility];  Service: Vascular;  Laterality: Bilateral;   INSERTION OF ILIAC STENT Bilateral 11/12/2018   Procedure: INSERTION OF ILIAC STENT;  Surgeon: Jama Cordella MATSU, MD;  Location: [facility];  Service: Vascular;  Laterality: Bilateral;   LOWER EXTREMITY ANGIOGRAPHY Left 08/05/2018   Procedure: LOWER EXTREMITY ANGIOGRAPHY;  Surgeon: Jama Cordella MATSU, MD;  Location: [facility] INV[facility]IVE [facility];  Service: Cardiovascular;  Laterality: Left;   REVERSE SHOULDER ARTHROPLASTY Right 04/08/2018   Procedure: REVERSE SHOULDER ARTHROPLASTY;  Surgeon: Edie Norleen PARAS, MD;  Location: [facility];  Service: Orthopedics;  Laterality: Right;   TUBAL LIGATION     WOUND DEBRIDEMENT Bilateral 12/02/2018   Procedure: DEBRIDEMENT WOUND;  Surgeon: Jama Cordella MATSU, MD;  Location: [facility] OR[facility];  Service: Vascular;  Laterality: Bilateral;    OB History     Gravida  2   Para  2   Term      Preterm      AB      Living  2      SAB      IAB      Ectopic      Multiple      Live Births           Obstetric Comments  Menstrual age: 56  Age 1st Pregnancy: 89          Home Medications    Prior to Admission medications   Medication Sig Start Date End Date Taking? Authorizing Provider  atorvastatin  (LIPITOR) 20 MG tablet TAKE 2 TABLETS (40 MG TOTAL) BY MOUTH DAILY. 06/13/23  Yes Justus Leita DEL, MD  baclofen (LIORESAL) 10 MG tablet Take 1 tablet (10 mg total) by mouth 3 (three) times daily. 04/11/24  Yes Bernardino Ditch, NP  Calcium  Carbonate-Vitamin D (CALCIUM  600+D PO) Take 1 tablet by mouth 2 (two) times daily.   Yes [provider]  carvedilol (COREG) 3.125 MG tablet Take 3.125 mg by mouth 2 (two) times daily with a meal.   Yes [provider]  cholecalciferol  (VITAMIN D) 25 MCG (1000 UT) tablet Take 1 tablet (1,000 Units total) by mouth daily. 10/09/19  Yes Fausto Sor A, DO  DULoxetine  (CYMBALTA ) 30 MG capsule TAKE 1 CAPSULE BY MOUTH EVERY DAY  03/16/24  Yes Berglund, Laura H, MD  fenofibrate  (TRICOR ) 145 MG tablet TAKE 1 TABLET BY MOUTH EVERY DAY 03/12/24  Yes Berglund, Laura H, MD  HYDROcodone -acetaminophen  (NORCO/VICODIN) 5-325 MG tablet Take 1 tablet by mouth every 6 (six) hours as needed. 04/11/24  Yes Bernardino Ditch, NP  magnesium  oxide (MAG-OX) 400 MG tablet Take 400 mg by mouth daily.   Yes [provider]  Multiple Vitamin (MULTIVITAMIN WITH MINERALS) TABS tablet Take 1 tablet by mouth daily. 10/09/19  Yes Fausto Sor A, DO  ondansetron  (ZOFRAN -ODT) 4 MG disintegrating tablet Take 1 tablet (4 mg total) by mouth every 8 (eight) hours as needed for nausea or vomiting. 04/11/24  Yes Bernardino Ditch, NP  predniSONE  (STERAPRED UNI-PAK 21 TAB) 10 MG (21) TBPK tablet Take 6 tablets  on day 1, 5 tablets day 2, 4 tablets day 3, 3 tablets day 4, 2 tablets day 5, 1 tablet day 6 04/11/24  Yes Bernardino Ditch, NP  vitamin B-12 (CYANOCOBALAMIN ) 1000 MCG tablet Take 1,000 mcg by mouth daily.   Yes [provider]  vitamin E  400 UNIT capsule Take 400 Units by mouth daily.    Yes [provider]  zinc  sulfate 220 (50 Zn) MG capsule Take 1 capsule (220 mg total) by mouth daily. 10/09/19  Yes Fausto Sor A, DO  acetaminophen  (TYLENOL ) 325 MG tablet Take 2 tablets (650 mg total) by mouth every 6 (six) hours as needed for mild pain or headache (fever >/= 101). 10/08/19   Fausto Sor A, DO  clopidogrel  (PLAVIX ) 75 MG tablet TAKE 1 TABLET BY MOUTH EVERY DAY 03/12/24   Justus Leita DEL, MD  Continuous Glucose Sensor (FREESTYLE LIBRE 3 PLUS SENSOR) MISC PLACE 1 SENSOR ON THE SKIN EVERY 14 DAYS. 11/18/23   Justus Leita DEL, MD  docusate sodium  (COLACE) 100 MG capsule Take 100 mg by mouth daily.    [provider]  empagliflozin  (JARDIANCE ) 25 MG TABS tablet Take by mouth daily.    [provider]  ferrous sulfate 324 MG TBEC Take 324 mg by mouth.    [provider]  fluconazole  (DIFLUCAN ) 150 MG tablet Take  1 tab p.o. every 72 hours 10/20/23   Arvis Jolan NOVAK, PA-C  furosemide  (LASIX ) 20 MG tablet Take 20 mg by mouth daily as needed for fluid.     [provider]  gabapentin  (NEURONTIN ) 300 MG capsule Take 300 mg by mouth at bedtime.    [provider]  glimepiride  (AMARYL ) 4 MG tablet TAKE 1 TABLET BY MOUTH DAILY WITH BREAKFAST 12/16/23   Justus Leita DEL, MD  phenazopyridine  (PYRIDIUM ) 200 MG tablet Take 1 tablet (200 mg total) by mouth 3 (three) times daily. 10/20/23   Arvis Jolan NOVAK, PA-C  senna-docusate (SENOKOT-S) 8.6-50 MG tablet Take 1 tablet by mouth at bedtime as needed for mild constipation. 10/08/19   Fausto Sor LABOR, DO  spironolactone (ALDACTONE) 25 MG tablet Take 12.5 mg by mouth daily.    [provider]    Family History Family History  Problem Relation Age of Onset   Colon cancer Cousin 82   Lymphoma Brother 50   Lung cancer Mother    Hypertension Father    Kidney failure Father    Breast cancer Neg Hx     Social History Social History   Tobacco Use   Smoking status: Former    Current packs/day: 0.00    Types: Cigarettes    Start date: 2002    Quit date: 2008    Years since quitting: 17.5   Smokeless tobacco: Never  Vaping Use   Vaping status: Never Used  Substance Use Topics   Alcohol use: Yes    Comment: rare   Drug use: No     Allergies   Tape and Silicone   Review of Systems Review of Systems  Respiratory:  Negative for shortness of breath.   Musculoskeletal:  Positive for back pain.     Physical Exam Triage Vital Signs ED Triage Vitals  Encounter Vitals Group     BP      Girls Systolic BP Percentile      Girls Diastolic BP Percentile      Boys Systolic BP Percentile      Boys Diastolic BP Percentile  Pulse      Resp      Temp      Temp src      SpO2      Weight      Height      Head Circumference      Peak Flow      Pain Score      Pain Loc      Pain Education      Exclude from Growth Chart     No data found.  Updated Vital Signs BP (!) 152/77 (BP Location: Right Arm)   Pulse 87   Temp 98.7 F (37.1 C) (Oral)   Resp 16   SpO2 94%   Visual Acuity Right Eye Distance:   Left Eye Distance:   Bilateral Distance:    Right Eye Near:   Left Eye Near:    Bilateral Near:     Physical Exam Vitals and nursing note reviewed.  Constitutional:      Appearance: Normal appearance. She is not ill-appearing.  HENT:     Head: Normocephalic and atraumatic.   Cardiovascular:     Rate and Rhythm: Normal rate and regular rhythm.     Pulses: Normal pulses.     Heart sounds: Normal heart sounds. No murmur heard.    No friction rub. No gallop.  Pulmonary:     Effort: Pulmonary effort is normal.     Breath sounds: Normal breath sounds. No wheezing, rhonchi or rales.   Musculoskeletal:        General: Tenderness present.   Skin:    General: Skin is warm and dry.     Capillary Refill: Capillary refill takes less than 2 seconds.   Neurological:     General: No focal deficit present.     Mental Status: She is alert and oriented to person, place, and time.      UC Treatments / Results  Labs (all labs ordered are listed, but only abnormal results are displayed) Labs Reviewed - No data to display  EKG   Radiology No results found.  Procedures Procedures (including critical care time)  Medications Ordered in UC Medications - No data to display  Initial Impression / Assessment and Plan / UC Course  I have reviewed the triage vital signs and the nursing notes.  Pertinent labs & imaging results that were available during my care of the patient were reviewed by me and considered in my medical decision making (see chart for details).   Patient is a pleasant, nontoxic-appearing 78 year old female presenting for evaluation of left upper back pain as outlined HPI above.  In the exam room she has no midline spinous process tenderness or step-off in her thoracic spine but she  does have marked spasm in her left thoracic paraspinous region that is tender to palpation.  This does increase with movement and deep breathing.  Her lungs are clear to auscultation all fields.  I suspect that she sustained a muscle spasm as a result of repetitive use of her left arm to grab for objects as well as to assist in transfer from her wheelchair to other locations.  Given that she has tried ibuprofen and Tylenol  at home without any improvement of symptoms I do feel that steroids are appropriate.  I will have staff administer 10 mg of IM Decadron  here in clinic and discharge her home on a 6-day prednisone  taper.  I will also order 10 mg of baclofen to be administered 3 times a day  as needed for muscle spasm.  Additionally, given that she is nauseated I will have staff minister 4 mg of ODT Zofran  I will discharge her home with same.  Due to the fact that she is in a significant amount of pain I will also discharge her home with a short prescription of Norco that she can use until the steroids take effect.  Patient has no open or count of prescription in PDMP.   Final Clinical Impressions(s) / UC Diagnoses   Final diagnoses:  Acute left-sided thoracic back pain     Discharge Instructions      Start the prednisone  tomorrow morning at breakfast time for your back pain.  Be mindful that this medication will cause your sugar to go up so monitor your glucose closely.  Take the baclofen, 10 mg every 8 hours, on a schedule for the next 48 hours and then as needed.  You may use the Zofran  every 8 hours as needed for nausea and vomiting.  Hopefully once your pain is under control your nausea will resolve.  Until the steroids kick in I have also prescribed you a short course of Norco.  This medication contains Tylenol  so make sure that if you are taking other Tylenol  containing products that you are not taking more than 4000 mg in total in a 24-hour duration.  Apply moist heat to your back for 30  minutes at a time 2-3 times a day to improve blood flow to the area and help remove the lactic acid causing the spasm.  Follow the back exercises given at discharge.  Return for reevaluation for any new or worsening symptoms.      ED Prescriptions     Medication Sig Dispense Auth. Provider   predniSONE  (STERAPRED UNI-PAK 21 TAB) 10 MG (21) TBPK tablet Take 6 tablets on day 1, 5 tablets day 2, 4 tablets day 3, 3 tablets day 4, 2 tablets day 5, 1 tablet day 6 21 tablet Bernardino Ditch, NP   baclofen (LIORESAL) 10 MG tablet Take 1 tablet (10 mg total) by mouth 3 (three) times daily. 30 each Bernardino Ditch, NP   HYDROcodone -acetaminophen  (NORCO/VICODIN) 5-325 MG tablet Take 1 tablet by mouth every 6 (six) hours as needed. 10 tablet Bernardino Ditch, NP   ondansetron  (ZOFRAN -ODT) 4 MG disintegrating tablet Take 1 tablet (4 mg total) by mouth every 8 (eight) hours as needed for nausea or vomiting. 20 tablet Bernardino Ditch, NP      I have reviewed the PDMP during this encounter.   Bernardino Ditch, NP 04/11/24 [phone]

## 2024-04-11 NOTE — Discharge Instructions (Addendum)
 Start the prednisone  tomorrow morning at breakfast time for your back pain.  Be mindful that this medication will cause your sugar to go up so monitor your glucose closely.  Take the baclofen, 10 mg every 8 hours, on a schedule for the next 48 hours and then as needed.  You may use the Zofran  every 8 hours as needed for nausea and vomiting.  Hopefully once your pain is under control your nausea will resolve.  Until the steroids kick in I have also prescribed you a short course of Norco.  This medication contains Tylenol  so make sure that if you are taking other Tylenol  containing products that you are not taking more than 4000 mg in total in a 24-hour duration.  Apply moist heat to your back for 30 minutes at a time 2-3 times a day to improve blood flow to the area and help remove the lactic acid causing the spasm.  Follow the back exercises given at discharge.  Return for reevaluation for any new or worsening symptoms.

## 2024-04-11 NOTE — ED Triage Notes (Signed)
 Left upper back pain since Wednesday that makes her nauseas.

## 2024-04-13 ENCOUNTER — Ambulatory Visit
Admission: EM | Admit: 2024-04-13 | Discharge: 2024-04-13 | Disposition: A | Discharge status: Home / Self Care | Attending: Physician Assistant | Admitting: Physician Assistant

## 2024-04-13 DIAGNOSIS — B029 Zoster without complications: Secondary | ICD-10-CM

## 2024-04-13 DIAGNOSIS — I10 Essential (primary) hypertension: Secondary | ICD-10-CM | POA: Diagnosis not present

## 2024-04-13 MED ORDER — OXYCODONE HCL 5 MG PO TABS: 5.0000 mg | ORAL_TABLET | Freq: Four times a day (QID) | ORAL | 0 refills | Status: AC | PRN

## 2024-04-13 MED ORDER — VALACYCLOVIR HCL 1 G PO TABS: 1000.0000 mg | ORAL_TABLET | Freq: Three times a day (TID) | ORAL | 0 refills | Status: DC

## 2024-04-13 NOTE — ED Triage Notes (Signed)
 Pt c/o rash in L side of abdomen radiating to back x1 day. States red & painful.

## 2024-04-13 NOTE — ED Provider Notes (Signed)
 [facility]    CSN: [id number] Arrival date & time: 04/13/24  0848      History   Chief Complaint Chief Complaint  Patient presents with   Rash    HPI Christina Blackburn is a 78 y.o. female with acquired absence of left lower extremity-wheelchair-bound.  Other history includes CHF, type 2 diabetes (currently diet controlled per patient, hypertension, peripheral artery disease, hyperlipidemia, CAD, carotid artery stenosis, and presence of pacemaker  Patient presents with a family member for severe left upper abdominal and thoracic back pain for the past several days.  She reports noting a rash yesterday.  She was actually seen here 2 days ago and the rash was not present.  She was thought to be having musculoskeletal back pain and was prescribed prednisone  taper, hydrocodone , baclofen, and Zofran .  Patient says initially the medications helped but is no longer helping since the rash appeared.  Patient says the rash is present exactly where she was having the pain when she was seen here.  She is not reporting any fevers, chest pain, shortness of breath, palpitations.  Has had fatigue and loss of appetite.  No other complaints.    HPI  Past Medical History:  Diagnosis Date   Anemia    CHF (congestive heart failure) (HCC)    Chicken pox    Colon polyps    Complication of anesthesia    Coronary artery disease    Cystitis 1986   Diabetes mellitus without complication (HCC) 2001   Diverticulosis    Heart disease 2008   Heart murmur    Hyperlipidemia    Hypertension 2003   Pacemaker 08/12/2020   PAD (peripheral artery disease) (HCC)    PONV (postoperative nausea and vomiting)    Rotator cuff tendinitis    Squamous cell skin cancer 2016    Patient Active Problem List   Diagnosis Date Noted   Bilateral hand pain 09/16/2023   Bilateral wrist pain 09/16/2023   Thyroid  goiter 05/14/2023   Bilateral carotid artery stenosis 06/23/2021   Subclavian artery stenosis  (HCC) 06/23/2021   Moderate mitral valve stenosis 06/23/2021   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/2020   Red blood cell antibody positive 08/16/2020   Cardiac pacemaker in situ 08/12/2020   Bradycardia 06/17/2020   RBBB (right bundle branch block) 09/24/2019   Dermatitis 03/23/2019   Atherosclerosis of native arteries of extremity with intermittent claudication (HCC) 11/12/2018   Obesity (BMI 30.0-34.9) 04/22/2018   Status post reverse total shoulder replacement, 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   Rotator cuff tendinitis, left 03/26/2018   Spondylosis of cervical region without myelopathy or radiculopathy 03/26/2018   PAD (peripheral artery disease) (HCC) 05/16/2017   Essential hypertension 05/16/2017   Hyperlipidemia associated with type 2 diabetes mellitus (HCC) 05/16/2017   Hx of cardiac cath 05/16/2015   Type II diabetes mellitus with complication (HCC) 03/25/2014   Coronary artery disease involving native coronary artery of native heart without angina pectoris 03/25/2014   S/P TAH-BSO 03/25/2014   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    Past Surgical History:  Procedure Laterality Date   ABDOMINAL HYSTERECTOMY  1989   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   APPLICATION OF WOUND VAC Bilateral 12/02/2018   Procedure: APPLICATION OF WOUND VAC;  Surgeon: Jama Cordella MATSU, MD;  Location: [facility];  Service: Vascular;  Laterality: Bilateral;   BREAST BIOPSY Left 2014   neg   BYPASS GRAFTING FEMORAL/ANTERIOR TIBIAL W/VEIN Right 11/07/2022   CARDIAC CATHETERIZATION  cornary stent   CESAREAN SECTION  1971, 1975   CHOLECYSTECTOMY  1987   COLONOSCOPY WITH PROPOFOL  N/A 09/22/2018   Procedure: COLONOSCOPY WITH PROPOFOL ;  Surgeon: Gaylyn Gladis PENNER, MD;  Location: [facility];  Service: Endoscopy;  Laterality: N/A;   COLONOSCOPY WITH PROPOFOL  N/A 12/15/2021   Procedure: COLONOSCOPY WITH PROPOFOL ;  Surgeon: Maryruth Ole DASEN, MD;  Location: [facility] ENDOSCOPY;   Service: Endoscopy;  Laterality: N/A;  DM, WHEELCHAIR   CORONARY ANGIOPLASTY  2008   stent x1   ENDARTERECTOMY FEMORAL Bilateral 11/12/2018   Procedure: ENDARTERECTOMY FEMORAL;  Surgeon: Jama Cordella MATSU, MD;  Location: [facility];  Service: Vascular;  Laterality: Bilateral;   INSERTION OF ILIAC STENT Bilateral 11/12/2018   Procedure: INSERTION OF ILIAC STENT;  Surgeon: Jama Cordella MATSU, MD;  Location: [facility];  Service: Vascular;  Laterality: Bilateral;   LOWER EXTREMITY ANGIOGRAPHY Left 08/05/2018   Procedure: LOWER EXTREMITY ANGIOGRAPHY;  Surgeon: Jama Cordella MATSU, MD;  Location: [facility];  Service: Cardiovascular;  Laterality: Left;   REVERSE SHOULDER ARTHROPLASTY Right 04/08/2018   Procedure: REVERSE SHOULDER ARTHROPLASTY;  Surgeon: Edie Norleen PARAS, MD;  Location: [facility];  Service: Orthopedics;  Laterality: Right;   TUBAL LIGATION     WOUND DEBRIDEMENT Bilateral 12/02/2018   Procedure: DEBRIDEMENT WOUND;  Surgeon: Jama Cordella MATSU, MD;  Location: [facility];  Service: Vascular;  Laterality: Bilateral;    OB History     Gravida  2   Para  2   Term      Preterm      AB      Living  2      SAB      IAB      Ectopic      Multiple      Live Births           Obstetric Comments  Menstrual age: 80  Age 1st Pregnancy: 33          Home Medications    Prior to Admission medications   Medication Sig Start Date End Date Taking? Authorizing Provider  oxyCODONE  (ROXICODONE ) 5 MG immediate release tablet Take 1 tablet (5 mg total) by mouth every 6 (six) hours as needed for up to 5 days for severe pain (pain score 7-10). 04/13/24 04/18/24 Yes Arvis Jolan NOVAK, PA-C  valACYclovir (VALTREX) 1000 MG tablet Take 1 tablet (1,000 mg total) by mouth 3 (three) times daily. 04/13/24  Yes Arvis Jolan NOVAK, PA-C  acetaminophen  (TYLENOL ) 325 MG tablet Take 2 tablets (650 mg total) by mouth every 6 (six) hours as needed for mild pain or headache (fever >/= 101). 10/08/19    Fausto Burnard LABOR, DO  atorvastatin  (LIPITOR) 20 MG tablet TAKE 2 TABLETS (40 MG TOTAL) BY MOUTH DAILY. 06/13/23   Justus Leita DEL, MD  baclofen (LIORESAL) 10 MG tablet Take 1 tablet (10 mg total) by mouth 3 (three) times daily. 04/11/24   Bernardino Ditch, NP  Calcium  Carbonate-Vitamin D (CALCIUM  600+D PO) Take 1 tablet by mouth 2 (two) times daily.    [provider]  carvedilol (COREG) 3.125 MG tablet Take 3.125 mg by mouth 2 (two) times daily with a meal.    [provider]  cholecalciferol  (VITAMIN D) 25 MCG (1000 UT) tablet Take 1 tablet (1,000 Units total) by mouth daily. 10/09/19   Fausto Burnard A, DO  clopidogrel  (PLAVIX ) 75 MG tablet TAKE 1 TABLET BY MOUTH EVERY DAY 03/12/24   Justus Leita DEL, MD  Continuous Glucose Sensor (  FREESTYLE LIBRE 3 PLUS SENSOR) MISC PLACE 1 SENSOR ON THE SKIN EVERY 14 DAYS. 11/18/23   Justus Leita DEL, MD  docusate sodium  (COLACE) 100 MG capsule Take 100 mg by mouth daily.    [provider]  DULoxetine  (CYMBALTA ) 30 MG capsule TAKE 1 CAPSULE BY MOUTH EVERY DAY 03/16/24   Justus Leita DEL, MD  empagliflozin  (JARDIANCE ) 25 MG TABS tablet Take by mouth daily.    [provider]  fenofibrate  (TRICOR ) 145 MG tablet TAKE 1 TABLET BY MOUTH EVERY DAY 03/12/24   Berglund, Laura H, MD  ferrous sulfate 324 MG TBEC Take 324 mg by mouth.    [provider]  fluconazole  (DIFLUCAN ) 150 MG tablet Take 1 tab p.o. every 72 hours 10/20/23   Arvis Jolan NOVAK, PA-C  furosemide  (LASIX ) 20 MG tablet Take 20 mg by mouth daily as needed for fluid.     [provider]  gabapentin  (NEURONTIN ) 300 MG capsule Take 300 mg by mouth at bedtime.    [provider]  glimepiride  (AMARYL ) 4 MG tablet TAKE 1 TABLET BY MOUTH DAILY WITH BREAKFAST 12/16/23   Justus Leita DEL, MD  magnesium  oxide (MAG-OX) 400 MG tablet Take 400 mg by mouth daily.    [provider]  Multiple Vitamin (MULTIVITAMIN WITH MINERALS) TABS tablet Take 1 tablet  by mouth daily. 10/09/19   Fausto Burnard LABOR, DO  ondansetron  (ZOFRAN -ODT) 4 MG disintegrating tablet Take 1 tablet (4 mg total) by mouth every 8 (eight) hours as needed for nausea or vomiting. 04/11/24   Bernardino Ditch, NP  phenazopyridine  (PYRIDIUM ) 200 MG tablet Take 1 tablet (200 mg total) by mouth 3 (three) times daily. 10/20/23   Arvis Jolan NOVAK, PA-C  senna-docusate (SENOKOT-S) 8.6-50 MG tablet Take 1 tablet by mouth at bedtime as needed for mild constipation. 10/08/19   Fausto Burnard LABOR, DO  spironolactone (ALDACTONE) 25 MG tablet Take 12.5 mg by mouth daily.    [provider]  vitamin B-12 (CYANOCOBALAMIN ) 1000 MCG tablet Take 1,000 mcg by mouth daily.    [provider]  vitamin E  400 UNIT capsule Take 400 Units by mouth daily.     [provider]  zinc  sulfate 220 (50 Zn) MG capsule Take 1 capsule (220 mg total) by mouth daily. 10/09/19   Fausto Burnard LABOR, DO    Family History Family History  Problem Relation Age of Onset   Colon cancer Cousin 95   Lymphoma Brother 76   Lung cancer Mother    Hypertension Father    Kidney failure Father    Breast cancer Neg Hx     Social History Social History   Tobacco Use   Smoking status: Former    Current packs/day: 0.00    Types: Cigarettes    Start date: 2002    Quit date: 2008    Years since quitting: 17.5   Smokeless tobacco: Never  Vaping Use   Vaping status: Never Used  Substance Use Topics   Alcohol use: Yes    Comment: rare   Drug use: No     Allergies   Tape and Silicone   Review of Systems Review of Systems  Constitutional:  Positive for appetite change and fatigue. Negative for fever.  Respiratory:  Negative for cough and shortness of breath.   Cardiovascular:  Negative for chest pain.  Gastrointestinal:  Positive for abdominal pain. Negative for nausea and vomiting.  Musculoskeletal:  Positive for back pain.  Neurological:  Negative for weakness.  Physical Exam Triage Vital  Signs ED Triage Vitals  Encounter Vitals Group     BP 04/13/24 0853 (!) 153/81     Girls Systolic BP Percentile --      Girls Diastolic BP Percentile --      Boys Systolic BP Percentile --      Boys Diastolic BP Percentile --      Pulse Rate 04/13/24 0853 78     Resp 04/13/24 0853 16     Temp 04/13/24 0853 98.7 F (37.1 C)     Temp Source 04/13/24 0853 Oral     SpO2 04/13/24 0853 94 %     Weight 04/13/24 0852 146 lb (66.2 kg)     Height 04/13/24 0852 4' 11 (1.499 m)     Head Circumference --      Peak Flow --      Pain Score 04/13/24 0859 10     Pain Loc --      Pain Education --      Exclude from Growth Chart --    No data found.  Updated Vital Signs BP (!) 153/81 (BP Location: Right Arm)   Pulse 78   Temp 98.7 F (37.1 C) (Oral)   Resp 16   Ht 4' 11 (1.499 m)   Wt 146 lb (66.2 kg)   SpO2 94%   BMI 29.49 kg/m       Physical Exam Vitals and nursing note reviewed.  Constitutional:      General: She is not in acute distress.    Appearance: Normal appearance. She is not ill-appearing or toxic-appearing.     Comments: Absence of left leg above knee, wheelchair bound  HENT:     Head: Normocephalic and atraumatic.   Eyes:     General: No scleral icterus.       Right eye: No discharge.        Left eye: No discharge.     Conjunctiva/sclera: Conjunctivae normal.    Cardiovascular:     Rate and Rhythm: Normal rate and regular rhythm.     Heart sounds: Normal heart sounds.  Pulmonary:     Effort: Pulmonary effort is normal. No respiratory distress.     Breath sounds: Normal breath sounds.   Musculoskeletal:     Cervical back: Neck supple.   Skin:    General: Skin is dry.     Findings: Rash present.   Neurological:     General: No focal deficit present.     Mental Status: She is alert. Mental status is at baseline.     Sensory: Sensory deficit: See image included in chart. Erythematous, tendern maculopapular rash of left upper abdomen to left thoracic  back. Does not cross midline.     Motor: No weakness.   Psychiatric:        Mood and Affect: Mood normal.        Behavior: Behavior normal.         UC Treatments / Results  Labs (all labs ordered are listed, but only abnormal results are displayed) Labs Reviewed - No data to display  EKG   Radiology No results found.  Procedures Procedures (including critical care time)  Medications Ordered in UC Medications - No data to display  Initial Impression / Assessment and Plan / UC Course  I have reviewed the triage vital signs and the nursing notes.  Pertinent labs & imaging results that were available during my care of the patient were reviewed by me and  considered in my medical decision making (see chart for details).   77 year old female presents for approximately 5-day history of left upper abdominal and thoracic back pain.  Seen here 2 days ago and thought to be having musculoskeletal pain.  Prescribed prednisone  taper, baclofen, hydrocodone  and Zofran .  This medication helped initially but she developed a painful rash in the same area where she was having the pain 2 days ago.  Rash developed yesterday.  Pain is gotten worse.  Reviewed urgent care notes from 2 days ago.  Blood pressure is elevated 153/81.  Patient to continue carvedilol.  Keep log of blood pressure.  If consistently greater than 140/90 should follow with PCP but this could be largely related to the corticosteroids.  Patient appears uncomfortable and in pain.  Chest clear.  Heart regular rate and rhythm.  See images included in chart of patient's rash.  Consistent with shingles.  I have advised her to discontinue the prednisone  a hydrocodone  and prescribed Valtrex and oxycodone .  She has gabapentin  at home.  Advised to take 1-2 times a day as needed first and then if that is not helping the pain so much she may take the oxycodone .  Reviewed current CDC guidelines, isolation protocol and ER precautions for  shingles/herpes zoster with patient and family member.   Final Clinical Impressions(s) / UC Diagnoses   Final diagnoses:  Herpes zoster without complication  Essential hypertension     Discharge Instructions      - Your rash is consistent with shingles. - I sent Valtrex which is an antiviral medication to the pharmacy.  Start it immediately and take full course. - Stop taking the hydrocodone  if is not having with the pain.  You can take oxycodone  which is a bit stronger. - You may also take Tylenol . - Discontinue prednisone .  This lowers your immune system and could potentially make the shingles rash worse. - You are contagious to anyone who has not had chickenpox or been vaccinated against chickenpox..  Avoid pregnant women, young children under age 61-6, elderly, immunocompromised people. - Contagious until this dries up and crusted over which takes about a week. - Go to ER if pain worsens, fever, breathing difficulty, increased weakness, or pain is severe in chest, you have racing heart.     ED Prescriptions     Medication Sig Dispense Auth. Provider   oxyCODONE  (ROXICODONE ) 5 MG immediate release tablet Take 1 tablet (5 mg total) by mouth every 6 (six) hours as needed for up to 5 days for severe pain (pain score 7-10). 15 tablet Arvis Huxley B, PA-C   valACYclovir (VALTREX) 1000 MG tablet Take 1 tablet (1,000 mg total) by mouth 3 (three) times daily. 21 tablet Arvis Huxley NOVAK, PA-C      I have reviewed the PDMP during this encounter.   Arvis Huxley NOVAK, PA-C 04/13/24 [phone]

## 2024-04-13 NOTE — Discharge Instructions (Addendum)
-   Your rash is consistent with shingles. - I sent Valtrex which is an antiviral medication to the pharmacy.  Start it immediately and take full course. - Stop taking the hydrocodone  if is not having with the pain.  You can take oxycodone  which is a bit stronger. - You may also take Tylenol . - Discontinue prednisone .  This lowers your immune system and could potentially make the shingles rash worse. - You are contagious to anyone who has not had chickenpox or been vaccinated against chickenpox..  Avoid pregnant women, young children under age 67-6, elderly, immunocompromised people. - Contagious until this dries up and crusted over which takes about a week. - Go to ER if pain worsens, fever, breathing difficulty, increased weakness, or pain is severe in chest, you have racing heart.

## 2024-04-21 DIAGNOSIS — I4510 Unspecified right bundle-branch block: Secondary | ICD-10-CM | POA: Diagnosis not present

## 2024-04-21 DIAGNOSIS — I498 Other specified cardiac arrhythmias: Secondary | ICD-10-CM | POA: Diagnosis not present

## 2024-04-21 DIAGNOSIS — I2510 Atherosclerotic heart disease of native coronary artery without angina pectoris: Secondary | ICD-10-CM | POA: Diagnosis not present

## 2024-04-21 DIAGNOSIS — E785 Hyperlipidemia, unspecified: Secondary | ICD-10-CM | POA: Diagnosis not present

## 2024-04-21 DIAGNOSIS — Z8679 Personal history of other diseases of the circulatory system: Secondary | ICD-10-CM | POA: Diagnosis not present

## 2024-04-21 DIAGNOSIS — I350 Nonrheumatic aortic (valve) stenosis: Secondary | ICD-10-CM | POA: Diagnosis not present

## 2024-04-21 DIAGNOSIS — Z45018 Encounter for adjustment and management of other part of cardiac pacemaker: Secondary | ICD-10-CM | POA: Diagnosis not present

## 2024-04-21 DIAGNOSIS — R001 Bradycardia, unspecified: Secondary | ICD-10-CM | POA: Diagnosis not present

## 2024-04-21 DIAGNOSIS — I050 Rheumatic mitral stenosis: Secondary | ICD-10-CM | POA: Diagnosis not present

## 2024-04-21 DIAGNOSIS — I10 Essential (primary) hypertension: Secondary | ICD-10-CM | POA: Diagnosis not present

## 2024-04-21 DIAGNOSIS — Z950 Presence of cardiac pacemaker: Secondary | ICD-10-CM | POA: Diagnosis not present

## 2024-04-21 DIAGNOSIS — I771 Stricture of artery: Secondary | ICD-10-CM | POA: Diagnosis not present

## 2024-04-24 ENCOUNTER — Ambulatory Visit
Admission: EM | Admit: 2024-04-24 | Discharge: 2024-04-24 | Disposition: A | Discharge status: Home / Self Care | Attending: Emergency Medicine | Admitting: Emergency Medicine

## 2024-04-24 DIAGNOSIS — L03115 Cellulitis of right lower limb: Secondary | ICD-10-CM | POA: Diagnosis not present

## 2024-04-24 MED ORDER — SULFAMETHOXAZOLE-TRIMETHOPRIM 800-160 MG PO TABS: 1.0000 | ORAL_TABLET | Freq: Two times a day (BID) | ORAL | 0 refills | Status: AC

## 2024-04-24 NOTE — Discharge Instructions (Addendum)
 Take the Bactrim  DS twice daily with a full glass of water for 7 days to prevent cellulitis to right lower extremity.  Keep your right leg elevated is much as possible to help decrease swelling and aid in improving circulation and perfusion.  Monitor for any increase in redness, swelling, pus drainage, red streaks going up your leg, or fever.  If these conditions arise you need to return for reevaluation, see your PCP, or seek care in the ER.

## 2024-04-24 NOTE — ED Triage Notes (Signed)
 Pt is with her granddaughter  Pt c/o bump along the right shin that was tender to touch  Pt states that the bump has spread overnight and has become red

## 2024-04-24 NOTE — ED Provider Notes (Signed)
 [facility]    CSN: [id number] Arrival date & time: 04/24/24  1259      History   Chief Complaint Chief Complaint  Patient presents with   Rash    HPI Christina Blackburn is a 78 y.o. female.   HPI  78 year old female with past medical history significant for diabetes, heart disease, hypertension, CAD, PAD, CHF, status post femoropopliteal bypass presents for evaluation of redness to the shin of her right lower leg.  She reports that she hit her wheelchair approximately 5 days ago and then last night noticed that the redness was expanding.  It is not painful or itchy and the patient denies any fever.  Past Medical History:  Diagnosis Date   Anemia    CHF (congestive heart failure) (HCC)    Chicken pox    Colon polyps    Complication of anesthesia    Coronary artery disease    Cystitis 1986   Diabetes mellitus without complication (HCC) 2001   Diverticulosis    Heart disease 2008   Heart murmur    Hyperlipidemia    Hypertension 2003   Pacemaker 08/12/2020   PAD (peripheral artery disease) (HCC)    PONV (postoperative nausea and vomiting)    Rotator cuff tendinitis    Squamous cell skin cancer 2016    Patient Active Problem List   Diagnosis Date Noted   Bilateral hand pain 09/16/2023   Bilateral wrist pain 09/16/2023   Thyroid  goiter 05/14/2023   Bilateral carotid artery stenosis 06/23/2021   Subclavian artery stenosis (HCC) 06/23/2021   Moderate mitral valve stenosis 06/23/2021   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/2020   Red blood cell antibody positive 08/16/2020   Cardiac pacemaker in situ 08/12/2020   Bradycardia 06/17/2020   RBBB (right bundle branch block) 09/24/2019   Dermatitis 03/23/2019   Atherosclerosis of native arteries of extremity with intermittent claudication (HCC) 11/12/2018   Obesity (BMI 30.0-34.9) 04/22/2018   Status post reverse total shoulder replacement, 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   Rotator cuff  tendinitis, left 03/26/2018   Spondylosis of cervical region without myelopathy or radiculopathy 03/26/2018   PAD (peripheral artery disease) (HCC) 05/16/2017   Essential hypertension 05/16/2017   Hyperlipidemia associated with type 2 diabetes mellitus (HCC) 05/16/2017   Hx of cardiac cath 05/16/2015   Type II diabetes mellitus with complication (HCC) 03/25/2014   Coronary artery disease involving native coronary artery of native heart without angina pectoris 03/25/2014   S/P TAH-BSO 03/25/2014   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    Past Surgical History:  Procedure Laterality Date   ABDOMINAL HYSTERECTOMY  1989   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   APPLICATION OF WOUND VAC Bilateral 12/02/2018   Procedure: APPLICATION OF WOUND VAC;  Surgeon: Jama Cordella MATSU, MD;  Location: [facility];  Service: Vascular;  Laterality: Bilateral;   BREAST BIOPSY Left 2014   neg   BYPASS GRAFTING FEMORAL/ANTERIOR TIBIAL W/VEIN Right 11/07/2022   CARDIAC CATHETERIZATION     cornary stent   CESAREAN SECTION  1971, 1975   CHOLECYSTECTOMY  1987   COLONOSCOPY WITH PROPOFOL  N/A 09/22/2018   Procedure: COLONOSCOPY WITH PROPOFOL ;  Surgeon: Gaylyn Gladis PENNER, MD;  Location: [facility];  Service: Endoscopy;  Laterality: N/A;   COLONOSCOPY WITH PROPOFOL  N/A 12/15/2021   Procedure: COLONOSCOPY WITH PROPOFOL ;  Surgeon: Maryruth Ole DASEN, MD;  Location: [facility] END[facility];  Service: Endoscopy;  Laterality: N/A;  DM, WHEELCHAIR   CORONARY ANGIOPLASTY  2008   stent  x1   ENDARTERECTOMY FEMORAL Bilateral 11/12/2018   Procedure: ENDARTERECTOMY FEMORAL;  Surgeon: Jama Cordella MATSU, MD;  Location: [facility];  Service: Vascular;  Laterality: Bilateral;   INSERTION OF ILIAC STENT Bilateral 11/12/2018   Procedure: INSERTION OF ILIAC STENT;  Surgeon: Jama Cordella MATSU, MD;  Location: [facility];  Service: Vascular;  Laterality: Bilateral;   LOWER EXTREMITY ANGIOGRAPHY Left 08/05/2018   Procedure: LOWER  EXTREMITY ANGIOGRAPHY;  Surgeon: Jama Cordella MATSU, MD;  Location: [facility];  Service: Cardiovascular;  Laterality: Left;   REVERSE SHOULDER ARTHROPLASTY Right 04/08/2018   Procedure: REVERSE SHOULDER ARTHROPLASTY;  Surgeon: Edie Norleen PARAS, MD;  Location: [facility];  Service: Orthopedics;  Laterality: Right;   TUBAL LIGATION     WOUND DEBRIDEMENT Bilateral 12/02/2018   Procedure: DEBRIDEMENT WOUND;  Surgeon: Jama Cordella MATSU, MD;  Location: [facility];  Service: Vascular;  Laterality: Bilateral;    OB History     Gravida  2   Para  2   Term      Preterm      AB      Living  2      SAB      IAB      Ectopic      Multiple      Live Births           Obstetric Comments  Menstrual age: 89  Age 1st Pregnancy: 75          Home Medications    Prior to Admission medications   Medication Sig Start Date End Date Taking? Authorizing Provider  acetaminophen  (TYLENOL ) 325 MG tablet Take 2 tablets (650 mg total) by mouth every 6 (six) hours as needed for mild pain or headache (fever >/= 101). 10/08/19  Yes Fausto Sor A, DO  atorvastatin  (LIPITOR) 20 MG tablet TAKE 2 TABLETS (40 MG TOTAL) BY MOUTH DAILY. 06/13/23  Yes Justus Leita DEL, MD  Calcium  Carbonate-Vitamin D (CALCIUM  600+D PO) Take 1 tablet by mouth 2 (two) times daily.   Yes [provider]  carvedilol (COREG) 3.125 MG tablet Take 3.125 mg by mouth 2 (two) times daily with a meal.   Yes [provider]  cholecalciferol  (VITAMIN D) 25 MCG (1000 UT) tablet Take 1 tablet (1,000 Units total) by mouth daily. 10/09/19  Yes Fausto Sor A, DO  clopidogrel  (PLAVIX ) 75 MG tablet TAKE 1 TABLET BY MOUTH EVERY DAY 03/12/24  Yes Justus Leita DEL, MD  Continuous Glucose Sensor (FREESTYLE LIBRE 3 PLUS SENSOR) MISC PLACE 1 SENSOR ON THE SKIN EVERY 14 DAYS. 11/18/23  Yes Justus Leita DEL, MD  docusate sodium  (COLACE) 100 MG capsule Take 100 mg by mouth daily.   Yes [provider]   DULoxetine  (CYMBALTA ) 30 MG capsule TAKE 1 CAPSULE BY MOUTH EVERY DAY 03/16/24  Yes Justus Leita DEL, MD  fenofibrate  (TRICOR ) 145 MG tablet TAKE 1 TABLET BY MOUTH EVERY DAY 03/12/24  Yes Justus Leita DEL, MD  fluconazole  (DIFLUCAN ) 150 MG tablet Take 1 tab p.o. every 72 hours 10/20/23  Yes Arvis Huxley B, PA-C  furosemide  (LASIX ) 20 MG tablet Take 20 mg by mouth daily as needed for fluid.    Yes [provider]  gabapentin  (NEURONTIN ) 300 MG capsule Take 300 mg by mouth at bedtime.   Yes [provider]  magnesium  oxide (MAG-OX) 400 MG tablet Take 400 mg by mouth daily.   Yes [provider]  Multiple Vitamin (MULTIVITAMIN WITH MINERALS) TABS tablet Take 1 tablet  by mouth daily. 10/09/19  Yes Fausto Sor A, DO  ondansetron  (ZOFRAN -ODT) 4 MG disintegrating tablet Take 1 tablet (4 mg total) by mouth every 8 (eight) hours as needed for nausea or vomiting. 04/11/24  Yes Bernardino Ditch, NP  senna-docusate (SENOKOT-S) 8.6-50 MG tablet Take 1 tablet by mouth at bedtime as needed for mild constipation. 10/08/19  Yes Fausto Sor A, DO  spironolactone (ALDACTONE) 25 MG tablet Take 12.5 mg by mouth daily.   Yes [provider]  sulfamethoxazole -trimethoprim  (BACTRIM  DS) 800-160 MG tablet Take 1 tablet by mouth 2 (two) times daily for 7 days. 04/24/24 05/01/24 Yes Bernardino Ditch, NP  valACYclovir  (VALTREX ) 1000 MG tablet Take 1 tablet (1,000 mg total) by mouth 3 (three) times daily. 04/13/24  Yes Arvis Huxley B, PA-C  vitamin B-12 (CYANOCOBALAMIN ) 1000 MCG tablet Take 1,000 mcg by mouth daily.   Yes [provider]  vitamin E  400 UNIT capsule Take 400 Units by mouth daily.    Yes [provider]  zinc  sulfate 220 (50 Zn) MG capsule Take 1 capsule (220 mg total) by mouth daily. 10/09/19  Yes Fausto Sor LABOR, DO    Family History Family History  Problem Relation Age of Onset   Colon cancer Cousin 100   Lymphoma Brother 9   Lung cancer Mother     Hypertension Father    Kidney failure Father    Breast cancer Neg Hx     Social History Social History   Tobacco Use   Smoking status: Former    Current packs/day: 0.00    Types: Cigarettes    Start date: 2002    Quit date: 2008    Years since quitting: 17.5   Smokeless tobacco: Never  Vaping Use   Vaping status: Never Used  Substance Use Topics   Alcohol use: Yes    Comment: rare   Drug use: No     Allergies   Tape and Silicone   Review of Systems Review of Systems  Constitutional:  Negative for fever.  Skin:  Positive for color change.     Physical Exam Triage Vital Signs ED Triage Vitals [04/24/24 1309]  Encounter Vitals Group     BP 136/64     Girls Systolic BP Percentile      Girls Diastolic BP Percentile      Boys Systolic BP Percentile      Boys Diastolic BP Percentile      Pulse Rate 80     Resp      Temp 98.6 F (37 C)     Temp Source Oral     SpO2 96 %     Weight 146 lb (66.2 kg)     Height 4' 11 (1.499 m)     Head Circumference      Peak Flow      Pain Score 0     Pain Loc      Pain Education      Exclude from Growth Chart    No data found.  Updated Vital Signs BP 136/64 (BP Location: Left Arm)   Pulse 80   Temp 98.6 F (37 C) (Oral)   Ht 4' 11 (1.499 m)   Wt 146 lb (66.2 kg)   SpO2 96%   BMI 29.49 kg/m   Visual Acuity Right Eye Distance:   Left Eye Distance:   Bilateral Distance:    Right Eye Near:   Left Eye Near:    Bilateral Near:     Physical  Exam Vitals and nursing note reviewed.  Constitutional:      Appearance: Normal appearance. She is not ill-appearing.  HENT:     Head: Normocephalic and atraumatic.  Skin:    General: Skin is warm and dry.     Capillary Refill: Capillary refill takes less than 2 seconds.     Findings: Erythema present.  Neurological:     General: No focal deficit present.     Mental Status: She is alert and oriented to person, place, and time.      UC Treatments / Results   Labs (all labs ordered are listed, but only abnormal results are displayed) Labs Reviewed - No data to display  EKG   Radiology No results found.  Procedures Procedures (including critical care time)  Medications Ordered in UC Medications - No data to display  Initial Impression / Assessment and Plan / UC Course  I have reviewed the triage vital signs and the nursing notes.  Pertinent labs & imaging results that were available during my care of the patient were reviewed by me and considered in my medical decision making (see chart for details).   Patient is a pleasant, nontoxic-appearing 78 year old female presenting for evaluation of redness to the right lower extremity.  She has an AKA on the left and a femoropopliteal bypass for PAD right.  As you can see the above, there is an area of erythema on the anterior medial, medial right lower extremity with erythema extending distally and laterally.  The area is mildly warm to touch when compared to the surrounding skin but the erythema is blanchable.  There is no induration or fluctuance.  DP and PT pulses are 2+.  Given the rapid spread of redness in the last 24 hours there is concern for developing cellulitis.  Given the patient's history of PAD I do think that out of an abundance of precaution the patient should be placed on a course of antibiotics to prevent cellulitis.  I will start her on Bactrim  DS twice daily for possible for 7 days.  Advised her to elevate her right lower extremity to help decrease swelling.  She should monitor for any increasing redness, swelling, induration, red streaks, or fever development.  If these conditions arise she needs to return for reevaluation.   Final Clinical Impressions(s) / UC Diagnoses   Final diagnoses:  Cellulitis of right lower extremity     Discharge Instructions      Take the Bactrim  DS twice daily with a full glass of water for 7 days to prevent cellulitis to right lower  extremity.  Keep your right leg elevated is much as possible to help decrease swelling and aid in improving circulation and perfusion.  Monitor for any increase in redness, swelling, pus drainage, red streaks going up your leg, or fever.  If these conditions arise you need to return for reevaluation, see your PCP, or seek care in the ER.     ED Prescriptions     Medication Sig Dispense Auth. Provider   sulfamethoxazole -trimethoprim  (BACTRIM  DS) 800-160 MG tablet Take 1 tablet by mouth 2 (two) times daily for 7 days. 14 tablet Bernardino Ditch, NP      PDMP not reviewed this encounter.   Bernardino Ditch, NP 04/24/24 1325

## 2024-05-16 ENCOUNTER — Other Ambulatory Visit: Payer: Self-pay | Admitting: Internal Medicine

## 2024-05-16 DIAGNOSIS — E118 Type 2 diabetes mellitus with unspecified complications: Secondary | ICD-10-CM

## 2024-05-18 NOTE — Telephone Encounter (Signed)
 Requested medications are due for refill today.  yes  Requested medications are on the active medications list.  yes  Last refill. 11/18/2023 2 each 5 rf  Future visit scheduled.   yes  Notes to clinic.  Protocol would not attach    Requested Prescriptions  Pending Prescriptions Disp Refills   Continuous Glucose Sensor (FREESTYLE LIBRE 3 PLUS SENSOR) MISC [Pharmacy Med Name: FREESTYLE LIBRE 3 PLUS SENSOR] 2 each 5    Sig: PLACE 1 SENSOR ON THE SKIN EVERY 14 DAYS.     There is no refill protocol information for this order

## 2024-06-09 ENCOUNTER — Other Ambulatory Visit: Payer: Self-pay | Admitting: Internal Medicine

## 2024-06-09 ENCOUNTER — Encounter: Payer: Self-pay | Admitting: Internal Medicine

## 2024-06-09 ENCOUNTER — Ambulatory Visit (INDEPENDENT_AMBULATORY_CARE_PROVIDER_SITE_OTHER): Admitting: Internal Medicine

## 2024-06-09 VITALS — BP 122/78 | HR 80 | Ht 59.0 in | Wt 141.0 lb

## 2024-06-09 DIAGNOSIS — R790 Abnormal level of blood mineral: Secondary | ICD-10-CM

## 2024-06-09 DIAGNOSIS — Z89612 Acquired absence of left leg above knee: Secondary | ICD-10-CM

## 2024-06-09 DIAGNOSIS — E1169 Type 2 diabetes mellitus with other specified complication: Secondary | ICD-10-CM

## 2024-06-09 DIAGNOSIS — I771 Stricture of artery: Secondary | ICD-10-CM

## 2024-06-09 DIAGNOSIS — Z95811 Presence of heart assist device: Secondary | ICD-10-CM | POA: Insufficient documentation

## 2024-06-09 DIAGNOSIS — B354 Tinea corporis: Secondary | ICD-10-CM

## 2024-06-09 DIAGNOSIS — B0229 Other postherpetic nervous system involvement: Secondary | ICD-10-CM

## 2024-06-09 DIAGNOSIS — M6208 Separation of muscle (nontraumatic), other site: Secondary | ICD-10-CM | POA: Diagnosis not present

## 2024-06-09 DIAGNOSIS — E118 Type 2 diabetes mellitus with unspecified complications: Secondary | ICD-10-CM | POA: Diagnosis not present

## 2024-06-09 DIAGNOSIS — E785 Hyperlipidemia, unspecified: Secondary | ICD-10-CM

## 2024-06-09 MED ORDER — NYSTATIN-TRIAMCINOLONE 100000-0.1 UNIT/GM-% EX OINT: 1.0000 | TOPICAL_OINTMENT | Freq: Two times a day (BID) | CUTANEOUS | 1 refills | Status: AC

## 2024-06-09 MED ORDER — FLUCONAZOLE 100 MG PO TABS: 100.0000 mg | ORAL_TABLET | Freq: Every day | ORAL | 0 refills | Status: AC

## 2024-06-09 NOTE — Progress Notes (Signed)
 Date:  06/09/2024   Name:  Christina Blackburn   DOB:  Jun 27, 1946   MRN:  [id number]   Chief Complaint: Back Pain (Started in late June- Patient was diagnosed shingles at the end of June at [facility]. She is still tender to touch. Her stomach does have a mass. ) and Vaginal Itching (Patient has itchy rash around vagina and under stomach skin. X 1 month.)  Back Pain This is a chronic (since shingle in June) problem. Pertinent negatives include no chest pain, fever or headaches.  Vaginal Itching Associated symptoms include back pain and rash. Pertinent negatives include no chills, fever or headaches.  Rash This is a chronic problem. The affected locations include the abdomen, groin and genitalia. Pertinent negatives include no fatigue, fever or shortness of breath.  Diabetes She presents for her follow-up diabetic visit. She has type 2 diabetes mellitus. Her disease course has been worsening. Pertinent negatives for hypoglycemia include no dizziness, headaches or nervousness/anxiousness. Pertinent negatives for diabetes include no chest pain and no fatigue. When asked about current treatments, none were reported.    Review of Systems  Constitutional:  Negative for chills, fatigue and fever.  Respiratory:  Negative for chest tightness and shortness of breath.   Cardiovascular:  Negative for chest pain.  Musculoskeletal:  Positive for back pain.  Skin:  Positive for rash.  Neurological:  Negative for dizziness and headaches.  Psychiatric/Behavioral:  Negative for dysphoric mood and sleep disturbance. The patient is not nervous/anxious.      Lab Results  Component Value Date   NA 142 05/14/2023   K 4.2 05/14/2023   CO2 17 (L) 05/14/2023   GLUCOSE 238 (H) 05/14/2023   BUN 21 05/14/2023   CREATININE 0.89 05/14/2023   CALCIUM  9.9 05/14/2023   EGFR 68 05/14/2023   GFRNONAA >60 10/17/2019   Lab Results  Component Value Date   CHOL 152 05/14/2023   HDL 38 (L) 05/14/2023   LDLCALC 73  05/14/2023   TRIG 254 (H) 05/14/2023   CHOLHDL 4.0 05/14/2023   Lab Results  Component Value Date   TSH 1.010 05/14/2023   Lab Results  Component Value Date   HGBA1C 5.8 (A) 09/04/2023   Lab Results  Component Value Date   WBC 7.6 10/17/2019   HGB 11.9 (L) 10/17/2019   HCT 37.5 10/17/2019   MCV 86.4 10/17/2019   PLT 410 (H) 10/17/2019   Lab Results  Component Value Date   ALT 23 05/14/2023   AST 26 05/14/2023   ALKPHOS 65 05/14/2023   BILITOT <0.2 05/14/2023   No results found for: MARIEN BOLLS, VD25OH   Patient Active Problem List   Diagnosis Date Noted   Diastasis recti 06/09/2024   Presence of heart assist device (HCC) 06/09/2024   Bilateral hand pain 09/16/2023   Bilateral wrist pain 09/16/2023   Thyroid  goiter 05/14/2023   Bilateral carotid artery stenosis 06/23/2021   Subclavian artery stenosis (HCC) 06/23/2021   Moderate mitral valve stenosis 06/23/2021   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/2020   Red blood cell antibody positive 08/16/2020   Cardiac pacemaker in situ 08/12/2020   Bradycardia 06/17/2020   RBBB (right bundle branch block) 09/24/2019   Dermatitis 03/23/2019   Atherosclerosis of native arteries of extremity with intermittent claudication (HCC) 11/12/2018   Obesity (BMI 30.0-34.9) 04/22/2018   Status post reverse total shoulder replacement, 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   Rotator cuff tendinitis, left 03/26/2018   Spondylosis of cervical region without myelopathy  or radiculopathy 03/26/2018   PAD (peripheral artery disease) (HCC) 05/16/2017   Essential hypertension 05/16/2017   Hyperlipidemia associated with type 2 diabetes mellitus (HCC) 05/16/2017   Hx of cardiac cath 05/16/2015   Type II diabetes mellitus with complication (HCC) 03/25/2014   Coronary artery disease involving native coronary artery of native heart without angina pectoris 03/25/2014   S/P TAH-BSO 03/25/2014   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     Allergies  Allergen Reactions   Tape Other (See Comments)    Hypoallergenic tape - blisters Blisters   Silicone Itching    With long term contact    Past Surgical History:  Procedure Laterality Date   ABDOMINAL HYSTERECTOMY  1989   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   APPLICATION OF WOUND VAC Bilateral 12/02/2018   Procedure: APPLICATION OF WOUND VAC;  Surgeon: Jama Cordella MATSU, MD;  Location: [facility] ORS;  Service: Vascular;  Laterality: Bilateral;   BREAST BIOPSY Left 2014   neg   BYPASS GRAFTING FEMORAL/ANTERIOR TIBIAL W/VEIN Right 11/07/2022   CARDIAC CATHETERIZATION     cornary stent   CESAREAN SECTION  1971, 1975   CHOLECYSTECTOMY  1987   COLONOSCOPY WITH PROPOFOL  N/A 09/22/2018   Procedure: COLONOSCOPY WITH PROPOFOL ;  Surgeon: Gaylyn Gladis PENNER, MD;  Location: [facility] END[facility]COPY;  Service: Endoscopy;  Laterality: N/A;   COLONOSCOPY WITH PROPOFOL  N/A 12/15/2021   Procedure: COLONOSCOPY WITH PROPOFOL ;  Surgeon: Maryruth Ole DASEN, MD;  Location: [facility] ENDOSCOPY;  Service: Endoscopy;  Laterality: N/A;  DM, WHEELCHAIR   CORONARY ANGIOPLASTY  2008   stent x1   ENDARTERECTOMY FEMORAL Bilateral 11/12/2018   Procedure: ENDARTERECTOMY FEMORAL;  Surgeon: Jama Cordella MATSU, MD;  Location: [facility] ORS;  Service: Vascular;  Laterality: Bilateral;   INSERTION OF ILIAC STENT Bilateral 11/12/2018   Procedure: INSERTION OF ILIAC STENT;  Surgeon: Jama Cordella MATSU, MD;  Location: [facility] ORS;  Service: Vascular;  Laterality: Bilateral;   LOWER EXTREMITY ANGIOGRAPHY Left 08/05/2018   Procedure: LOWER EXTREMITY ANGIOGRAPHY;  Surgeon: Jama Cordella MATSU, MD;  Location: [facility] INVASIVE CV LAB;  Service: Cardiovascular;  Laterality: Left;   REVERSE SHOULDER ARTHROPLASTY Right 04/08/2018   Procedure: REVERSE SHOULDER ARTHROPLASTY;  Surgeon: Edie Norleen PARAS, MD;  Location: [facility] ORS;  Service: Orthopedics;  Laterality: Right;   TUBAL LIGATION     WOUND DEBRIDEMENT Bilateral 12/02/2018    Procedure: DEBRIDEMENT WOUND;  Surgeon: Jama Cordella MATSU, MD;  Location: [facility] ORS;  Service: Vascular;  Laterality: Bilateral;    Social History   Tobacco Use   Smoking status: Former    Current packs/day: 0.00    Types: Cigarettes    Start date: 2002    Quit date: 2008    Years since quitting: 17.6   Smokeless tobacco: Never  Vaping Use   Vaping status: Never Used  Substance Use Topics   Alcohol use: Yes    Comment: rare   Drug use: No     Medication list has been reviewed and updated.  Current Meds  Medication Sig   acetaminophen  (TYLENOL ) 325 MG tablet Take 2 tablets (650 mg total) by mouth every 6 (six) hours as needed for mild pain or headache (fever >/= 101).   atorvastatin  (LIPITOR) 20 MG tablet TAKE 2 TABLETS (40 MG TOTAL) BY MOUTH DAILY.   Calcium  Carbonate-Vitamin D (CALCIUM  600+D PO) Take 1 tablet by mouth 2 (two) times daily.   carvedilol (COREG) 3.125 MG tablet Take 3.125 mg by mouth 2 (two) times daily with a meal.  cholecalciferol  (VITAMIN D) 25 MCG (1000 UT) tablet Take 1 tablet (1,000 Units total) by mouth daily.   clopidogrel  (PLAVIX ) 75 MG tablet TAKE 1 TABLET BY MOUTH EVERY DAY   Continuous Glucose Sensor (FREESTYLE LIBRE 3 PLUS SENSOR) MISC PLACE 1 SENSOR ON THE SKIN EVERY 14 DAYS.   DULoxetine  (CYMBALTA ) 30 MG capsule TAKE 1 CAPSULE BY MOUTH EVERY DAY   fenofibrate  (TRICOR ) 145 MG tablet TAKE 1 TABLET BY MOUTH EVERY DAY   fluconazole  (DIFLUCAN ) 100 MG tablet Take 1 tablet (100 mg total) by mouth daily for 7 days.   furosemide  (LASIX ) 20 MG tablet Take 20 mg by mouth daily as needed for fluid.    gabapentin  (NEURONTIN ) 300 MG capsule Take 300 mg by mouth at bedtime.   magnesium  oxide (MAG-OX) 400 MG tablet Take 400 mg by mouth daily.   Multiple Vitamin (MULTIVITAMIN WITH MINERALS) TABS tablet Take 1 tablet by mouth daily.   nystatin -triamcinolone  ointment (MYCOLOG) Apply 1 Application topically 2 (two) times daily.   senna-docusate (SENOKOT-S) 8.6-50  MG tablet Take 1 tablet by mouth at bedtime as needed for mild constipation.   spironolactone (ALDACTONE) 25 MG tablet Take 12.5 mg by mouth daily.   vitamin B-12 (CYANOCOBALAMIN ) 1000 MCG tablet Take 1,000 mcg by mouth daily.   vitamin E  400 UNIT capsule Take 400 Units by mouth daily.    zinc  sulfate 220 (50 Zn) MG capsule Take 1 capsule (220 mg total) by mouth daily.   [DISCONTINUED] fluconazole  (DIFLUCAN ) 150 MG tablet Take 1 tab p.o. every 72 hours       03/30/2024    8:11 AM 09/04/2023    2:11 PM 05/14/2023    2:57 PM  GAD 7 : Generalized Anxiety Score  Nervous, Anxious, on Edge 0 0 0  Control/stop worrying 0 0 0  Worry too much - different things 0 0 0  Trouble relaxing 0 0 0  Restless 0 0 0  Easily annoyed or irritable 0 0 0  Afraid - awful might happen 0 0 0  Total GAD 7 Score 0 0 0  Anxiety Difficulty Not difficult at all Not difficult at all Not difficult at all       03/30/2024    8:11 AM 09/04/2023    2:11 PM 06/05/2023    3:05 PM  Depression screen PHQ 2/9  Decreased Interest 0 0 0  Down, Depressed, Hopeless 0 0 0  PHQ - 2 Score 0 0 0  Altered sleeping 0 0 0  Tired, decreased energy 0 0 0  Change in appetite 0 0 0  Feeling bad or failure about yourself  0 0 0  Trouble concentrating 0 0 0  Moving slowly or fidgety/restless 0 0 0  Suicidal thoughts 0 0 0  PHQ-9 Score 0 0 0  Difficult doing work/chores Not difficult at all Not difficult at all Not difficult at all    BP Readings from Last 3 Encounters:  06/09/24 122/78  04/24/24 136/64  04/13/24 (!) 153/81    Physical Exam Vitals and nursing note reviewed.  Constitutional:      General: She is not in acute distress.    Appearance: She is well-developed. She is obese.  HENT:     Head: Normocephalic and atraumatic.  Cardiovascular:     Rate and Rhythm: Normal rate and regular rhythm.  Pulmonary:     Effort: Pulmonary effort is normal. No respiratory distress.     Breath sounds: No wheezing or rhonchi.   Abdominal:  Palpations: Abdomen is soft.     Tenderness: There is no abdominal tenderness.     Comments: Diastasis recti noted Rash on lower left abdomen c/w resolving zoster Tinea rash under abdominal fold and in bilateral groin  Musculoskeletal:     Cervical back: Normal range of motion.  Skin:    General: Skin is warm and dry.     Findings: No rash.  Neurological:     Mental Status: She is alert and oriented to person, place, and time.  Psychiatric:        Mood and Affect: Mood normal.        Behavior: Behavior normal.     Wt Readings from Last 3 Encounters:  06/09/24 141 lb (64 kg)  04/24/24 146 lb (66.2 kg)  04/13/24 146 lb (66.2 kg)    BP 122/78   Pulse 80   Ht 4' 11 (1.499 m)   Wt 141 lb (64 kg)   SpO2 95%   BMI 28.48 kg/m   Assessment and Plan:  Problem List Items Addressed This Visit       Unprioritized   Type II diabetes mellitus with complication (HCC) - Primary   She stopped all her diabetic medications in January. She is no longer taking Jardiance  or glimepiride . Home BS are around 130-140 Will get follow up labs      Relevant Orders   Basic metabolic panel with GFR   Hemoglobin A1c   Hyperlipidemia associated with type 2 diabetes mellitus (HCC)   LDL is  Lab Results  Component Value Date   LDLCALC 73 05/14/2023   Currently taking atorvastatin .  No medication side effects or other concerns. Recommended LDL goal is < 70.       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 Lemon.    Leita HILARIO Adie, MD [facility] [place]

## 2024-06-09 NOTE — Assessment & Plan Note (Signed)
 LDL is  Lab Results  Component Value Date   LDLCALC 73 05/14/2023   Currently taking atorvastatin .  No medication side effects or other concerns. Recommended LDL goal is < 70.

## 2024-06-09 NOTE — Assessment & Plan Note (Signed)
Chronic and unchanged ?

## 2024-06-09 NOTE — Assessment & Plan Note (Addendum)
 She stopped all her diabetic medications in January. She is no longer taking Jardiance  or glimepiride . Home BS are around 130-140 Will get follow up labs

## 2024-06-09 NOTE — Assessment & Plan Note (Signed)
 She is reassured

## 2024-06-10 ENCOUNTER — Ambulatory Visit: Payer: Self-pay | Admitting: Internal Medicine

## 2024-06-10 ENCOUNTER — Ambulatory Visit: Payer: PPO | Admitting: Emergency Medicine

## 2024-06-10 VITALS — Ht 59.0 in | Wt 140.0 lb

## 2024-06-10 DIAGNOSIS — Z0000 Encounter for general adult medical examination without abnormal findings: Secondary | ICD-10-CM

## 2024-06-10 LAB — BASIC METABOLIC PANEL WITH GFR
BUN/Creatinine Ratio: 31 — ABNORMAL HIGH (ref 12–28)
BUN: 18 mg/dL (ref 8–27)
CO2: 21 mmol/L (ref 20–29)
Calcium: 10.3 mg/dL (ref 8.7–10.3)
Chloride: 100 mmol/L (ref 96–106)
Creatinine, Ser: 0.59 mg/dL (ref 0.57–1.00)
Glucose: 142 mg/dL — ABNORMAL HIGH (ref 70–99)
Potassium: 4.5 mmol/L (ref 3.5–5.2)
Sodium: 137 mmol/L (ref 134–144)
eGFR: 94 mL/min/1.73 (ref >59)

## 2024-06-10 LAB — MAGNESIUM: Magnesium: 1.4 mg/dL — ABNORMAL LOW (ref 1.6–2.3)

## 2024-06-10 LAB — HEMOGLOBIN A1C
Est. average glucose Bld gHb Est-mCnc: 140 mg/dL
Hgb A1c MFr Bld: 6.5 % — ABNORMAL HIGH (ref 4.8–5.6)

## 2024-06-10 NOTE — Telephone Encounter (Signed)
 Requested medication (s) are due for refill today: yes  Requested medication (s) are on the active medication list: yes  Last refill:  03/12/24  Future visit scheduled: yes  Notes to clinic:  Unable to refill per protocol due to failed labs, pending results. Routing for review.     Requested Prescriptions  Pending Prescriptions Disp Refills   clopidogrel  (PLAVIX ) 75 MG tablet [Pharmacy Med Name: CLOPIDOGREL  75 MG TABLET] 90 tablet 0    Sig: TAKE 1 TABLET BY MOUTH EVERY DAY     Hematology: Antiplatelets - clopidogrel  Failed - 06/10/2024  9:46 AM      Failed - HCT in normal range and within 180 days    HCT  Date Value Ref Range Status  10/17/2019 37.5 36.0 - 46.0 % Final         Failed - HGB in normal range and within 180 days    Hemoglobin  Date Value Ref Range Status  10/17/2019 11.9 (L) 12.0 - 15.0 g/dL Final         Failed - PLT in normal range and within 180 days    Platelets  Date Value Ref Range Status  10/17/2019 410 (H) 150 - 400 K/uL Final         Passed - Cr in normal range and within 360 days    Creatinine, Ser  Date Value Ref Range Status  06/09/2024 0.59 0.57 - 1.00 mg/dL Final         Passed - Valid encounter within last 6 months    Recent Outpatient Visits           Yesterday Type II diabetes mellitus with complication [facility])   [place] [facility] at [facility], Leita DEL, MD   2 months ago [place]   [facility] at [facility], Vinay K, MD               fenofibrate  (TRICOR ) 145 MG tablet [Pharmacy Med Name: FENOFIBRATE  145 MG TABLET] 90 tablet 0    Sig: TAKE 1 TABLET BY MOUTH EVERY DAY     Cardiovascular:  Antilipid - Fibric Acid Derivatives Failed - 06/10/2024  9:46 AM      Failed - ALT in normal range and within 360 days    ALT  Date Value Ref Range Status  05/14/2023 23 0 - 32 IU/L Final         Failed - AST in normal range and within 360 days    AST  Date  Value Ref Range Status  05/14/2023 26 0 - 40 IU/L Final         Failed - HGB in normal range and within 360 days    Hemoglobin  Date Value Ref Range Status  10/17/2019 11.9 (L) 12.0 - 15.0 g/dL Final         Failed - HCT in normal range and within 360 days    HCT  Date Value Ref Range Status  10/17/2019 37.5 36.0 - 46.0 % Final         Failed - PLT in normal range and within 360 days    Platelets  Date Value Ref Range Status  10/17/2019 410 (H) 150 - 400 K/uL Final         Failed - WBC in normal range and within 360 days    WBC  Date Value Ref Range Status  10/17/2019 7.6 4.0 - 10.5 K/uL Final  Failed - Lipid Panel in normal range within the last 12 months    Cholesterol, Total  Date Value Ref Range Status  05/14/2023 152 100 - 199 mg/dL Final   LDL Chol Calc (NIH)  Date Value Ref Range Status  05/14/2023 73 0 - 99 mg/dL Final   HDL  Date Value Ref Range Status  05/14/2023 38 (L) >39 mg/dL Final   Triglycerides  Date Value Ref Range Status  05/14/2023 254 (H) 0 - 149 mg/dL Final         Passed - Cr in normal range and within 360 days    Creatinine, Ser  Date Value Ref Range Status  06/09/2024 0.59 0.57 - 1.00 mg/dL Final         Passed - eGFR is 30 or above and within 360 days    GFR calc Af Amer  Date Value Ref Range Status  10/17/2019 >60 >60 mL/min Final   GFR calc non Af Amer  Date Value Ref Range Status  10/17/2019 >60 >60 mL/min Final   eGFR  Date Value Ref Range Status  06/09/2024 94 >59 mL/min/1.73 Final         Passed - Valid encounter within last 12 months    Recent Outpatient Visits           Yesterday Type II diabetes mellitus with complication [facility])   [place] [facility] at [facility], Leita DEL, MD   2 months ago [facility] Medicine at [facility], Vinay K, MD

## 2024-06-10 NOTE — Progress Notes (Signed)
 Subjective:   Christina Blackburn is a 78 y.o. who presents for a Medicare Wellness preventive visit.  As a reminder, Annual Wellness Visits don't include a physical exam, and some assessments may be limited, especially if this visit is performed virtually. We may recommend an in-person follow-up visit with your provider if needed.  Visit Complete: Virtual I connected with  Christina Blackburn on 06/10/24 by a audio enabled telemedicine application and verified that I am speaking with the correct person using two identifiers.  Patient Location: Home  Provider Location: Home Office  I discussed the limitations of evaluation and management by telemedicine. The patient expressed understanding and agreed to proceed.  Vital Signs: Because this visit was a virtual/telehealth visit, some criteria may be missing or patient reported. Any vitals not documented were not able to be obtained and vitals that have been documented are patient reported.  VideoDeclined- This patient declined Librarian, academic. Therefore the visit was completed with audio only.  Persons Participating in Visit: Patient.  AWV Questionnaire: Yes: Patient Medicare AWV questionnaire was completed by the patient on 06/06/24; I have confirmed that all information answered by patient is correct and no changes since this date.  Cardiac Risk Factors include: advanced age (>21men, >11 women);diabetes mellitus;hypertension;sedentary lifestyle;dyslipidemia     Objective:    Today's Vitals   06/10/24 1407  Weight: 140 lb (63.5 kg)  Height: 4' 11 (1.499 m)   Body mass index is 28.28 kg/m.     06/10/2024    2:22 PM 04/24/2024    1:12 PM 04/11/2024    8:31 AM 10/20/2023   11:50 AM 06/05/2023    3:07 PM 10/09/2019    5:31 PM 10/07/2019   11:00 PM  Advanced Directives  Does Patient Have a Medical Advance Directive? No No No No No No No  Would patient like information on creating a medical advance  directive? No - Patient declined  No - Patient declined  No - Patient declined No - Patient declined No - Patient declined    Current Medications (verified) Outpatient Encounter Medications as of 06/10/2024  Medication Sig   acetaminophen  (TYLENOL ) 325 MG tablet Take 2 tablets (650 mg total) by mouth every 6 (six) hours as needed for mild pain or headache (fever >/= 101).   atorvastatin  (LIPITOR) 20 MG tablet TAKE 2 TABLETS (40 MG TOTAL) BY MOUTH DAILY.   Calcium  Carbonate-Vitamin D (CALCIUM  600+D PO) Take 1 tablet by mouth 2 (two) times daily.   carvedilol (COREG) 3.125 MG tablet Take 3.125 mg by mouth 2 (two) times daily with a meal.   cholecalciferol  (VITAMIN D) 25 MCG (1000 UT) tablet Take 1 tablet (1,000 Units total) by mouth daily.   Continuous Glucose Sensor (FREESTYLE LIBRE 3 PLUS SENSOR) MISC PLACE 1 SENSOR ON THE SKIN EVERY 14 DAYS.   DULoxetine  (CYMBALTA ) 30 MG capsule TAKE 1 CAPSULE BY MOUTH EVERY DAY   fluconazole  (DIFLUCAN ) 100 MG tablet Take 1 tablet (100 mg total) by mouth daily for 7 days.   furosemide  (LASIX ) 20 MG tablet Take 20 mg by mouth daily as needed for fluid.    gabapentin  (NEURONTIN ) 300 MG capsule Take 300 mg by mouth at bedtime. (Patient taking differently: Take 300 mg by mouth at bedtime. Taking PRN)   magnesium  oxide (MAG-OX) 400 MG tablet Take 400 mg by mouth daily.   Multiple Vitamin (MULTIVITAMIN WITH MINERALS) TABS tablet Take 1 tablet by mouth daily.   nystatin -triamcinolone  ointment (MYCOLOG) Apply 1 Application  topically 2 (two) times daily.   senna-docusate (SENOKOT-S) 8.6-50 MG tablet Take 1 tablet by mouth at bedtime as needed for mild constipation.   spironolactone (ALDACTONE) 25 MG tablet Take 12.5 mg by mouth daily.   vitamin B-12 (CYANOCOBALAMIN ) 1000 MCG tablet Take 1,000 mcg by mouth daily.   vitamin E  400 UNIT capsule Take 400 Units by mouth daily.    zinc  sulfate 220 (50 Zn) MG capsule Take 1 capsule (220 mg total) by mouth daily.    [DISCONTINUED] clopidogrel  (PLAVIX ) 75 MG tablet TAKE 1 TABLET BY MOUTH EVERY DAY   [DISCONTINUED] fenofibrate  (TRICOR ) 145 MG tablet TAKE 1 TABLET BY MOUTH EVERY DAY   No facility-administered encounter medications on file as of 06/10/2024.    Allergies (verified) Tape and Silicone   History: Past Medical History:  Diagnosis Date   Anemia    CHF (congestive heart failure) (HCC)    Chicken pox    Colon polyps    Complication of anesthesia    Coronary artery disease    Cystitis 1986   Diabetes mellitus without complication (HCC) 2001   Diverticulosis    Heart disease 2008   Heart murmur    Hyperlipidemia    Hypertension 2003   Pacemaker 08/12/2020   PAD (peripheral artery disease) (HCC)    PONV (postoperative nausea and vomiting)    Rotator cuff tendinitis    Squamous cell skin cancer 2016   Past Surgical History:  Procedure Laterality Date   ABDOMINAL HYSTERECTOMY  1989   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   APPLICATION OF WOUND VAC Bilateral 12/02/2018   Procedure: APPLICATION OF WOUND VAC;  Surgeon: Jama Cordella MATSU, MD;  Location: [facility];  Service: Vascular;  Laterality: Bilateral;   BREAST BIOPSY Left 2014   neg   BYPASS GRAFTING FEMORAL/ANTERIOR TIBIAL W/VEIN Right 11/07/2022   CARDIAC CATHETERIZATION     cornary stent   CESAREAN SECTION  1971, 1975   CHOLECYSTECTOMY  1987   COLONOSCOPY WITH PROPOFOL  N/A 09/22/2018   Procedure: COLONOSCOPY WITH PROPOFOL ;  Surgeon: Gaylyn Gladis PENNER, MD;  Location: [facility];  Service: Endoscopy;  Laterality: N/A;   COLONOSCOPY WITH PROPOFOL  N/A 12/15/2021   Procedure: COLONOSCOPY WITH PROPOFOL ;  Surgeon: Maryruth Ole DASEN, MD;  Location: [facility];  Service: Endoscopy;  Laterality: N/A;  DM, WHEELCHAIR   CORONARY ANGIOPLASTY  2008   stent x1   ENDARTERECTOMY FEMORAL Bilateral 11/12/2018   Procedure: ENDARTERECTOMY FEMORAL;  Surgeon: Jama Cordella MATSU, MD;  Location: [facility];  Service: Vascular;   Laterality: Bilateral;   INSERTION OF ILIAC STENT Bilateral 11/12/2018   Procedure: INSERTION OF ILIAC STENT;  Surgeon: Jama Cordella MATSU, MD;  Location: [facility];  Service: Vascular;  Laterality: Bilateral;   LOWER EXTREMITY ANGIOGRAPHY Left 08/05/2018   Procedure: LOWER EXTREMITY ANGIOGRAPHY;  Surgeon: Jama Cordella MATSU, MD;  Location: [facility];  Service: Cardiovascular;  Laterality: Left;   REVERSE SHOULDER ARTHROPLASTY Right 04/08/2018   Procedure: REVERSE SHOULDER ARTHROPLASTY;  Surgeon: Edie Norleen PARAS, MD;  Location: [facility];  Service: Orthopedics;  Laterality: Right;   TUBAL LIGATION     WOUND DEBRIDEMENT Bilateral 12/02/2018   Procedure: DEBRIDEMENT WOUND;  Surgeon: Jama Cordella MATSU, MD;  Location: [facility];  Service: Vascular;  Laterality: Bilateral;   Family History  Problem Relation Age of Onset   Colon cancer Cousin 97   Lymphoma Brother 26   Lung cancer Mother    Hypertension Father    Kidney failure Father  Breast cancer Neg Hx    Social History   Socioeconomic History   Marital status: Widowed    Spouse name: Not on file   Number of children: 3   Years of education: Not on file   Highest education level: Bachelor's degree (e.g., BA, AB, BS)  Occupational History   Occupation: Retired    Comment: Publishing rights manager.   Tobacco Use   Smoking status: Former    Current packs/day: 0.00    Average packs/day: 2.0 packs/day for 37.0 years (74.0 ttl pk-yrs)    Types: Cigarettes    Start date: 75    Quit date: 2008    Years since quitting: 17.6   Smokeless tobacco: Never  Vaping Use   Vaping status: Never Used  Substance and Sexual Activity   Alcohol use: Yes    Comment: rare   Drug use: No   Sexual activity: Not Currently  Other Topics Concern   Not on file  Social History Narrative   Not on file   Social Drivers of Health   Financial Resource Strain: Medium Risk (06/10/2024)   Overall Financial Resource Strain (CARDIA)    Difficulty of Paying  Living Expenses: Somewhat hard  Food Insecurity: Food Insecurity Present (06/10/2024)   Hunger Vital Sign    Worried About Running Out of Food in the Last Year: Sometimes true    Ran Out of Food in the Last Year: Never true  Transportation Needs: No Transportation Needs (06/10/2024)   PRAPARE - Administrator, Civil Service (Medical): No    Lack of Transportation (Non-Medical): No  Physical Activity: Inactive (06/10/2024)   Exercise Vital Sign    Days of Exercise per Week: 0 days    Minutes of Exercise per Session: 0 min  Stress: No Stress Concern Present (06/10/2024)   [employer] of Occupational Health - Occupational Stress Questionnaire    Feeling of Stress: Not at all  Social Connections: Socially Isolated (06/10/2024)   Social Connection and Isolation Panel    Frequency of Communication with Friends and Family: More than three times a week    Frequency of Social Gatherings with Friends and Family: Once a week    Attends Religious Services: Never    Database administrator or Organizations: No    Attends Banker Meetings: Never    Marital Status: Widowed    Tobacco Counseling Counseling given: Not Answered    Clinical Intake:  Pre-visit preparation completed: Yes  Pain : No/denies pain     BMI - recorded: 28.28 Nutritional Status: BMI 25 -29 Overweight Nutritional Risks: None Diabetes: Yes CBG done?: No (FBS 136 per patient) Did pt. bring in CBG monitor from home?: No  Lab Results  Component Value Date   HGBA1C 6.5 (H) 06/09/2024   HGBA1C 5.8 (A) 09/04/2023   HGBA1C 6.0 (H) 05/14/2023     How often do you need to have someone help you when you read instructions, pamphlets, or other written materials from your doctor or pharmacy?: 1 - Never  Interpreter Needed?: No  Information entered by :: Vina Ned, CMA   Activities of Daily Living     06/10/2024    2:10 PM  In your present state of health, do you have any difficulty  performing the following activities:  Hearing? 0  Vision? 0  Difficulty concentrating or making decisions? 0  Walking or climbing stairs? 1  Comment uses wheelchair  Dressing or bathing? 0  Doing errands, shopping? 1  Comment doesn't drive, rides [facility] or family member takes to appointments  Preparing Food and eating ? N  Using the Toilet? N  In the past six months, have you accidently leaked urine? Y  Do you have problems with loss of bowel control? N  Managing your Medications? N  Managing your Finances? N  Housekeeping or managing your Housekeeping? N    Patient Care Team: Justus Leita DEL, MD as PCP - General (Internal Medicine) Al-Khatib, Marston Centers, MD as Referring Physician (Cardiology) Alona Coletta Redbird, [place] (Vascular Surgery) Rymer, Delon Dragon, MD as Referring Physician (Cardiology) Mittie Gaskin, MD as Referring Physician (Ophthalmology)  I have updated your Care Teams any recent Medical Services you may have received from other providers in the past year.     Assessment:   This is a routine wellness examination for Media.  Hearing/Vision screen Hearing Screening - Comments:: Denies hearing loss  Vision Screening - Comments:: Due for DM eye exam (last 04/16/23), Dr. Mittie, [place]   Goals Addressed             This Visit's Progress    Patient Stated       Lose weight and keep blood sugar low/controlled       Depression Screen     06/10/2024    2:19 PM 03/30/2024    8:11 AM 09/04/2023    2:11 PM 06/05/2023    3:05 PM 05/14/2023    2:57 PM  PHQ 2/9 Scores  PHQ - 2 Score 0 0 0 0 0  PHQ- 9 Score 0 0 0 0 0    Fall Risk     06/10/2024    2:24 PM 03/30/2024    8:10 AM 09/04/2023    2:11 PM 06/05/2023    3:07 PM 05/14/2023    2:57 PM  Fall Risk   Falls in the past year? 0 0 0 0 0  Number falls in past yr: 0 0 0 0 0  Injury with Fall? 0 0 0 0 0  Risk for fall due to : History of fall(s);Impaired balance/gait;Orthopedic  patient;Impaired mobility No Fall Risks No Fall Risks No Fall Risks No Fall Risks  Follow up Falls evaluation completed;Education provided Falls evaluation completed Falls evaluation completed Falls prevention discussed;Falls evaluation completed Falls evaluation completed    MEDICARE RISK AT HOME:  Medicare Risk at Home Any stairs in or around the home?: No (ramp) If so, are there any without handrails?: No Home free of loose throw rugs in walkways, pet beds, electrical cords, etc?: Yes Adequate lighting in your home to reduce risk of falls?: Yes Life alert?: No Use of a cane, walker or w/c?: Yes (wheelchair) Grab bars in the bathroom?: Yes Shower chair or bench in shower?: Yes Elevated toilet seat or a handicapped toilet?: Yes  TIMED UP AND GO:  Was the test performed?  No  Cognitive Function: 6CIT completed        06/10/2024    2:25 PM 06/05/2023    3:13 PM  6CIT Screen  What Year? 0 points 0 points  What month? 0 points 0 points  What time? 0 points 0 points  Count back from 20 0 points 0 points  Months in reverse 0 points 0 points  Repeat phrase 0 points 0 points  Total Score 0 points 0 points    Immunizations Immunization History  Administered Date(s) Administered   INFLUENZA, HIGH DOSE SEASONAL PF 07/11/2019   Influenza Inj Mdck Quad Pf 07/21/2018  Influenza,inj,Quad PF,6+ Mos 07/26/2020   Influenza,inj,quad, With Preservative 07/30/2017   Influenza-Unspecified 10/21/2013, 07/27/2014, 07/16/2015   PNEUMOCOCCAL CONJUGATE-20 09/04/2023   Pneumococcal Polysaccharide-23 04/09/2018    Screening Tests Health Maintenance  Topic Date Due   COVID-19 Vaccine (1) Never done   Hepatitis C Screening  Never done   DTaP/Tdap/Td (1 - Tdap) Never done   Zoster Vaccines- Shingrix (1 of 2) Never done   DEXA SCAN  Never done   OPHTHALMOLOGY EXAM  04/15/2024   INFLUENZA VACCINE  01/12/2025 (Originally 05/15/2024)   MAMMOGRAM  06/12/2024   Diabetic kidney evaluation - Urine  ACR  09/03/2024   FOOT EXAM  09/03/2024   HEMOGLOBIN A1C  12/10/2024   Diabetic kidney evaluation - eGFR measurement  06/09/2025   Medicare Annual Wellness (AWV)  06/10/2025   Colonoscopy  12/16/2031   Pneumococcal Vaccine: 50+ Years  Completed   HPV VACCINES  Aged Out   Meningococcal B Vaccine  Aged Out    Health Maintenance  Health Maintenance Due  Topic Date Due   COVID-19 Vaccine (1) Never done   Hepatitis C Screening  Never done   DTaP/Tdap/Td (1 - Tdap) Never done   Zoster Vaccines- Shingrix (1 of 2) Never done   DEXA SCAN  Never done   OPHTHALMOLOGY EXAM  04/15/2024   Health Maintenance Items Addressed: Diabetic Foot Exam recommended, See Nurse Notes at the end of this note  Additional Screening:  Vision Screening: Recommended annual ophthalmology exams for early detection of glaucoma and other disorders of the eye. Would you like a referral to an eye doctor? No    Dental Screening: Recommended annual dental exams for proper oral hygiene  Community Resource Referral / Chronic Care Management: CRR required this visit?  No   CCM required this visit?  No   Plan:    I have personally reviewed and noted the following in the patient's chart:   Medical and social history Use of alcohol, tobacco or illicit drugs  Current medications and supplements including opioid prescriptions. Patient is not currently taking opioid prescriptions. Functional ability and status Nutritional status Physical activity Advanced directives List of other physicians Hospitalizations, surgeries, and ER visits in previous 12 months Vitals Screenings to include cognitive, depression, and falls Referrals and appointments  In addition, I have reviewed and discussed with patient certain preventive protocols, quality metrics, and best practice recommendations. A written personalized care plan for preventive services as well as general preventive health recommendations were provided to  patient.   Vina Ned, CMA   06/10/2024   After Visit Summary: (MyChart) Due to this being a telephonic visit, the after visit summary with patients personalized plan was offered to patient via MyChart   Notes:  FBS this morning 136 per patient Past due for DM eye exam. Gave ph# to call and schedule Needs Tdap vaccine (pharmacy) Wants to discuss with PCP the need for shingles vaccine at next OV Will need DM foot exam at next OV 10/13/24 Declined DM & Nutrition education referral Declined flu and Covid vaccines Declined DEXA scan Screening colonoscopy and MMG no longer recommended due to age

## 2024-06-10 NOTE — Patient Instructions (Signed)
 [name] , Thank you for taking time out of your busy schedule to complete your Annual Wellness Visit with me. I enjoyed our conversation and look forward to speaking with you again next year. I, as well as your care team,  appreciate your ongoing commitment to your health goals. Please review the following plan we discussed and let me know if I can assist you in the future. Your Game plan/ To Do List    Referrals: None  Follow up Visits: We will see or speak with you next year for your Next Medicare AWV with our clinical staff Have you seen your provider in the last 6 months (3 months if uncontrolled diabetes)? Yes  Clinician Recommendations: You are past due for your yearly diabetic eye exam. Call [facility] @ [phone] at your earliest convenience to schedule. Get tetanus vaccine at your local pharmacy at your convenience. Discuss with Dr. Justus the need for a shingles vaccine at you next OV.  Aim for 30 minutes of exercise or brisk walking, 6-8 glasses of water, and 5 servings of fruits and vegetables each day.       This is a list of the screenings recommended for you:  Health Maintenance  Topic Date Due   COVID-19 Vaccine (1) Never done   Hepatitis C Screening  Never done   DTaP/Tdap/Td vaccine (1 - Tdap) Never done   Zoster (Shingles) Vaccine (1 of 2) Never done   DEXA scan (bone density measurement)  Never done   Eye exam for diabetics  04/15/2024   Flu Shot  01/12/2025*   Mammogram  06/12/2024   Yearly kidney health urinalysis for diabetes  09/03/2024   Complete foot exam   09/03/2024   Hemoglobin A1C  12/10/2024   Yearly kidney function blood test for diabetes  06/09/2025   Medicare Annual Wellness Visit  06/10/2025   Colon Cancer Screening  12/16/2031   Pneumococcal Vaccine for age over 55  Completed   HPV Vaccine  Aged Out   Meningitis B Vaccine  Aged Out  *Topic was postponed. The date shown is not the original due date.    Advanced directives: (Declined)  Advance directive discussed with you today. Even though you declined this today, please call our office should you change your mind, and we can give you the proper paperwork for you to fill out. Advance Care Planning is important because it:  [x]  Makes sure you receive the medical care that is consistent with your values, goals, and preferences  [x]  It provides guidance to your family and loved ones and reduces their decisional burden about whether or not they are making the right decisions based on your wishes.  Follow the link provided in your after visit summary or read over the paperwork we have mailed to you to help you started getting your Advance Directives in place. If you need assistance in completing these, please reach out to us  so that we can help you!  See attachments for Preventive Care and Fall Prevention Tips.   Fall Prevention in the Home, Adult Falls can cause injuries and affect people of all ages. There are many simple things that you can do to make your home safe and to help prevent falls. If you need it, ask for help making these changes. What actions can I take to prevent falls? General information Use good lighting in all rooms. Make sure to: Replace any light bulbs that burn out. Turn on lights if it is dark and use  night-lights. Keep items that you use often in easy-to-reach places. Lower the shelves around your home if needed. Move furniture so that there are clear paths around it. Do not keep throw rugs or other things on the floor that can make you trip. If any of your floors are uneven, fix them. Add color or contrast paint or tape to clearly mark and help you see: Grab bars or handrails. First and last steps of staircases. Where the edge of each step is. If you use a ladder or stepladder: Make sure that it is fully opened. Do not climb a closed ladder. Make sure the sides of the ladder are locked in place. Have someone hold the ladder while you use  it. Know where your pets are as you move through your home. What can I do in the bathroom?     Keep the floor dry. Clean up any water that is on the floor right away. Remove soap buildup in the bathtub or shower. Buildup makes bathtubs and showers slippery. Use non-skid mats or decals on the floor of the bathtub or shower. Attach bath mats securely with double-sided, non-slip rug tape. If you need to sit down while you are in the shower, use a non-slip stool. Install grab bars by the toilet and in the bathtub and shower. Do not use towel bars as grab bars. What can I do in the bedroom? Make sure that you have a light by your bed that is easy to reach. Do not use any sheets or blankets on your bed that hang to the floor. Have a firm bench or chair with side arms that you can use for support when you get dressed. What can I do in the kitchen? Clean up any spills right away. If you need to reach something above you, use a sturdy step stool that has a grab bar. Keep electrical cables out of the way. Do not use floor polish or wax that makes floors slippery. What can I do with my stairs? Do not leave anything on the stairs. Make sure that you have a light switch at the top and the bottom of the stairs. Have them installed if you do not have them. Make sure that there are handrails on both sides of the stairs. Fix handrails that are broken or loose. Make sure that handrails are as long as the staircases. Install non-slip stair treads on all stairs in your home if they do not have carpet. Avoid having throw rugs at the top or bottom of stairs, or secure the rugs with carpet tape to prevent them from moving. Choose a carpet design that does not hide the edge of steps on the stairs. Make sure that carpet is firmly attached to the stairs. Fix any carpet that is loose or worn. What can I do on the outside of my home? Use bright outdoor lighting. Repair the edges of walkways and driveways and fix  any cracks. Clear paths of anything that can make you trip, such as tools or rocks. Add color or contrast paint or tape to clearly mark and help you see high doorway thresholds. Trim any bushes or trees on the main path into your home. Check that handrails are securely fastened and in good repair. Both sides of all steps should have handrails. Install guardrails along the edges of any raised decks or porches. Have leaves, snow, and ice cleared regularly. Use sand, salt, or ice melt on walkways during winter months if you live where  there is ice and snow. In the garage, clean up any spills right away, including grease or oil spills. What other actions can I take? Review your medicines with your health care provider. Some medicines can make you confused or feel dizzy. This can increase your chance of falling. Wear closed-toe shoes that fit well and support your feet. Wear shoes that have rubber soles and low heels. Use a cane, walker, scooter, or crutches that help you move around if needed. Talk with your provider about other ways that you can decrease your risk of falls. This may include seeing a physical therapist to learn to do exercises to improve movement and strength. Where to find more information Centers for Disease Control and Prevention, STEAD[employer]: TonerPromos.no [employer] on Aging: BaseRingTones.pl National Institute on Aging: BaseRingTones.pl Contact a health care provider if: You are afraid of falling at home. You feel weak, drowsy, or dizzy at home. You fall at home. Get help right away if you: Lose consciousness or have trouble moving after a fall. Have a fall that causes a head injury. These symptoms may be an emergency. Get help right away. Call 911. Do not wait to see if the symptoms will go away. Do not drive yourself to the hospital. This information is not intended to replace advice given to you by your health care provider. Make sure you discuss any questions you have with your  health care provider. Document Revised: 06/04/2022 Document Reviewed: 06/04/2022 Elsevier Patient Education  2024 [employer].

## 2024-06-13 ENCOUNTER — Other Ambulatory Visit: Payer: Self-pay | Admitting: Internal Medicine

## 2024-06-15 NOTE — Telephone Encounter (Signed)
 Requested Prescriptions  Pending Prescriptions Disp Refills   DULoxetine  (CYMBALTA ) 30 MG capsule [Pharmacy Med Name: DULOXETINE  HCL DR 30 MG CAP] 90 capsule 0    Sig: TAKE 1 CAPSULE BY MOUTH EVERY DAY     Psychiatry: Antidepressants - SNRI - duloxetine  Passed - 06/15/2024  9:40 AM      Passed - Cr in normal range and within 360 days    Creatinine, Ser  Date Value Ref Range Status  06/09/2024 0.59 0.57 - 1.00 mg/dL Final         Passed - eGFR is 30 or above and within 360 days    GFR calc Af Amer  Date Value Ref Range Status  10/17/2019 >60 >60 mL/min Final   GFR calc non Af Amer  Date Value Ref Range Status  10/17/2019 >60 >60 mL/min Final   eGFR  Date Value Ref Range Status  06/09/2024 94 >59 mL/min/1.73 Final         Passed - Completed PHQ-2 or PHQ-9 in the last 360 days      Passed - Last BP in normal range    BP Readings from Last 1 Encounters:  06/09/24 122/78         Passed - Valid encounter within last 6 months    Recent Outpatient Visits           6 days ago Type II diabetes mellitus with complication [facility])   [place] [facility] at [facility], Leita DEL, MD   2 months ago [facility] at [facility], Vinay K, MD

## 2024-06-29 DIAGNOSIS — I5040 Unspecified combined systolic (congestive) and diastolic (congestive) heart failure: Secondary | ICD-10-CM | POA: Diagnosis not present

## 2024-07-16 ENCOUNTER — Encounter: Payer: Self-pay | Admitting: Internal Medicine

## 2024-07-23 DIAGNOSIS — I10 Essential (primary) hypertension: Secondary | ICD-10-CM | POA: Diagnosis not present

## 2024-07-23 DIAGNOSIS — I771 Stricture of artery: Secondary | ICD-10-CM | POA: Diagnosis not present

## 2024-07-23 DIAGNOSIS — I4510 Unspecified right bundle-branch block: Secondary | ICD-10-CM | POA: Diagnosis not present

## 2024-07-23 DIAGNOSIS — I2510 Atherosclerotic heart disease of native coronary artery without angina pectoris: Secondary | ICD-10-CM | POA: Diagnosis not present

## 2024-07-23 DIAGNOSIS — Z950 Presence of cardiac pacemaker: Secondary | ICD-10-CM | POA: Diagnosis not present

## 2024-07-23 DIAGNOSIS — I498 Other specified cardiac arrhythmias: Secondary | ICD-10-CM | POA: Diagnosis not present

## 2024-07-23 DIAGNOSIS — I050 Rheumatic mitral stenosis: Secondary | ICD-10-CM | POA: Diagnosis not present

## 2024-07-23 DIAGNOSIS — R001 Bradycardia, unspecified: Secondary | ICD-10-CM | POA: Diagnosis not present

## 2024-07-23 DIAGNOSIS — I350 Nonrheumatic aortic (valve) stenosis: Secondary | ICD-10-CM | POA: Diagnosis not present

## 2024-07-23 DIAGNOSIS — Z8679 Personal history of other diseases of the circulatory system: Secondary | ICD-10-CM | POA: Diagnosis not present

## 2024-07-23 DIAGNOSIS — E785 Hyperlipidemia, unspecified: Secondary | ICD-10-CM | POA: Diagnosis not present

## 2024-07-23 DIAGNOSIS — Z45018 Encounter for adjustment and management of other part of cardiac pacemaker: Secondary | ICD-10-CM | POA: Diagnosis not present

## 2024-08-13 NOTE — Progress Notes (Signed)
 Christina Blackburn                                          MRN: [id number]   08/13/2024   The [facility] Quality Team Specialist reviewed this patient medical record for the purposes of chart review for care gap closure. The following were reviewed: chart review for care gap closure-kidney health evaluation for diabetes:eGFR  and uACR.    [facility]I Quality Team

## 2024-09-09 DIAGNOSIS — Z89612 Acquired absence of left leg above knee: Secondary | ICD-10-CM | POA: Diagnosis not present

## 2024-09-09 DIAGNOSIS — Z95828 Presence of other vascular implants and grafts: Secondary | ICD-10-CM | POA: Diagnosis not present

## 2024-09-09 DIAGNOSIS — I779 Disorder of arteries and arterioles, unspecified: Secondary | ICD-10-CM | POA: Diagnosis not present

## 2024-09-13 ENCOUNTER — Other Ambulatory Visit: Payer: Self-pay | Admitting: Internal Medicine

## 2024-09-13 DIAGNOSIS — E1169 Type 2 diabetes mellitus with other specified complication: Secondary | ICD-10-CM

## 2024-09-13 DIAGNOSIS — I771 Stricture of artery: Secondary | ICD-10-CM

## 2024-09-14 ENCOUNTER — Other Ambulatory Visit: Payer: Self-pay | Admitting: Internal Medicine

## 2024-09-16 NOTE — Telephone Encounter (Signed)
 Requested medication (s) are due for refill today: yes  Requested medication (s) are on the active medication list: yes  Last refill:  06/10/24  Future visit scheduled: {Yes  Notes to clinic:  Unable to refill per protocol due to failed labs, no updated results.      Requested Prescriptions  Pending Prescriptions Disp Refills   clopidogrel  (PLAVIX ) 75 MG tablet [Pharmacy Med Name: CLOPIDOGREL  75 MG TABLET] 90 tablet 0    Sig: TAKE 1 TABLET BY MOUTH EVERY DAY     Hematology: Antiplatelets - clopidogrel  Failed - 09/16/2024  2:06 PM      Failed - HCT in normal range and within 180 days    HCT  Date Value Ref Range Status  10/17/2019 37.5 36.0 - 46.0 % Final         Failed - HGB in normal range and within 180 days    Hemoglobin  Date Value Ref Range Status  10/17/2019 11.9 (L) 12.0 - 15.0 g/dL Final         Failed - PLT in normal range and within 180 days    Platelets  Date Value Ref Range Status  10/17/2019 410 (H) 150 - 400 K/uL Final         Passed - Cr in normal range and within 360 days    Creatinine, Ser  Date Value Ref Range Status  06/09/2024 0.59 0.57 - 1.00 mg/dL Final         Passed - Valid encounter within last 6 months    Recent Outpatient Visits           3 months ago Type II diabetes mellitus with complication (HCC)   [place] [facility] at [facility], Leita DEL, MD   5 months ago [facility] at [facility], Vinay K, MD               fenofibrate  (TRICOR ) 145 MG tablet [Pharmacy Med Name: FENOFIBRATE  145 MG TABLET] 90 tablet 0    Sig: TAKE 1 TABLET BY MOUTH EVERY DAY     Cardiovascular:  Antilipid - Fibric Acid Derivatives Failed - 09/16/2024  2:06 PM      Failed - ALT in normal range and within 360 days    ALT  Date Value Ref Range Status  05/14/2023 23 0 - 32 IU/L Final         Failed - AST in normal range and within 360 days    AST  Date Value Ref  Range Status  05/14/2023 26 0 - 40 IU/L Final         Failed - HGB in normal range and within 360 days    Hemoglobin  Date Value Ref Range Status  10/17/2019 11.9 (L) 12.0 - 15.0 g/dL Final         Failed - HCT in normal range and within 360 days    HCT  Date Value Ref Range Status  10/17/2019 37.5 36.0 - 46.0 % Final         Failed - PLT in normal range and within 360 days    Platelets  Date Value Ref Range Status  10/17/2019 410 (H) 150 - 400 K/uL Final         Failed - WBC in normal range and within 360 days    WBC  Date Value Ref Range Status  10/17/2019 7.6 4.0 - 10.5 K/uL Final  Failed - Lipid Panel in normal range within the last 12 months    Cholesterol, Total  Date Value Ref Range Status  05/14/2023 152 100 - 199 mg/dL Final   LDL Chol Calc (NIH)  Date Value Ref Range Status  05/14/2023 73 0 - 99 mg/dL Final   HDL  Date Value Ref Range Status  05/14/2023 38 (L) >39 mg/dL Final   Triglycerides  Date Value Ref Range Status  05/14/2023 254 (H) 0 - 149 mg/dL Final         Passed - Cr in normal range and within 360 days    Creatinine, Ser  Date Value Ref Range Status  06/09/2024 0.59 0.57 - 1.00 mg/dL Final         Passed - eGFR is 30 or above and within 360 days    GFR calc Af Amer  Date Value Ref Range Status  10/17/2019 >60 >60 mL/min Final   GFR calc non Af Amer  Date Value Ref Range Status  10/17/2019 >60 >60 mL/min Final   eGFR  Date Value Ref Range Status  06/09/2024 94 >59 mL/min/1.73 Final         Passed - Valid encounter within last 12 months    Recent Outpatient Visits           3 months ago Type II diabetes mellitus with complication [facility])   [place] [facility] at [facility], Leita DEL, MD   5 months ago [facility] at [facility], Vinay K, MD

## 2024-09-17 NOTE — Telephone Encounter (Signed)
 Requested Prescriptions  Pending Prescriptions Disp Refills   DULoxetine  (CYMBALTA ) 30 MG capsule [Pharmacy Med Name: DULOXETINE  HCL DR 30 MG CAP] 90 capsule 0    Sig: TAKE 1 CAPSULE BY MOUTH EVERY DAY     Psychiatry: Antidepressants - SNRI - duloxetine  Passed - 09/17/2024 10:18 AM      Passed - Cr in normal range and within 360 days    Creatinine, Ser  Date Value Ref Range Status  06/09/2024 0.59 0.57 - 1.00 mg/dL Final         Passed - eGFR is 30 or above and within 360 days    GFR calc Af Amer  Date Value Ref Range Status  10/17/2019 >60 >60 mL/min Final   GFR calc non Af Amer  Date Value Ref Range Status  10/17/2019 >60 >60 mL/min Final   eGFR  Date Value Ref Range Status  06/09/2024 94 >59 mL/min/1.73 Final         Passed - Completed PHQ-2 or PHQ-9 in the last 360 days      Passed - Last BP in normal range    BP Readings from Last 1 Encounters:  06/09/24 122/78         Passed - Valid encounter within last 6 months    Recent Outpatient Visits           3 months ago Type II diabetes mellitus with complication [facility])   [place] [facility] at [facility], Leita DEL, MD   5 months ago [facility] at [facility], Vinay K, MD

## 2024-10-13 ENCOUNTER — Ambulatory Visit (INDEPENDENT_AMBULATORY_CARE_PROVIDER_SITE_OTHER): Admitting: Internal Medicine

## 2024-10-13 ENCOUNTER — Encounter: Payer: Self-pay | Admitting: Internal Medicine

## 2024-10-13 ENCOUNTER — Other Ambulatory Visit: Payer: Self-pay | Admitting: Internal Medicine

## 2024-10-13 VITALS — BP 116/74 | HR 73 | Ht 59.0 in

## 2024-10-13 DIAGNOSIS — E1169 Type 2 diabetes mellitus with other specified complication: Secondary | ICD-10-CM | POA: Diagnosis not present

## 2024-10-13 DIAGNOSIS — Z7984 Long term (current) use of oral hypoglycemic drugs: Secondary | ICD-10-CM

## 2024-10-13 DIAGNOSIS — Z89612 Acquired absence of left leg above knee: Secondary | ICD-10-CM

## 2024-10-13 DIAGNOSIS — I2510 Atherosclerotic heart disease of native coronary artery without angina pectoris: Secondary | ICD-10-CM

## 2024-10-13 DIAGNOSIS — E118 Type 2 diabetes mellitus with unspecified complications: Secondary | ICD-10-CM | POA: Diagnosis not present

## 2024-10-13 DIAGNOSIS — I10 Essential (primary) hypertension: Secondary | ICD-10-CM

## 2024-10-13 DIAGNOSIS — I70211 Atherosclerosis of native arteries of extremities with intermittent claudication, right leg: Secondary | ICD-10-CM

## 2024-10-13 DIAGNOSIS — E785 Hyperlipidemia, unspecified: Secondary | ICD-10-CM

## 2024-10-13 MED ORDER — FREESTYLE LIBRE 3 PLUS SENSOR MISC: 3 refills | Status: AC

## 2024-10-13 MED ORDER — ATORVASTATIN CALCIUM 20 MG PO TABS: 40.0000 mg | ORAL_TABLET | Freq: Every day | ORAL | 1 refills | Status: AC

## 2024-10-13 NOTE — Assessment & Plan Note (Signed)
 No change, wheelchair bound but does her own laundry, meal prep and housework.

## 2024-10-13 NOTE — Progress Notes (Signed)
 "   Date:  10/13/2024   Name:  Christina Blackburn   DOB:  [birth year]   MRN:  [id number]   Chief Complaint: Diabetes Mellitus, Hypertension, and Cyst (Patient reports a lump under her skin, located on her right forearm/elbow, has noticed one of lumps getting bigger, not painful )  Hypertension Pertinent negatives include no chest pain, headaches, palpitations or shortness of breath. Past treatments include beta blockers and diuretics.  Diabetes She presents for her follow-up diabetic visit. She has type 2 diabetes mellitus. Pertinent negatives for hypoglycemia include no dizziness or headaches. Pertinent negatives for diabetes include no chest pain, no fatigue and no weakness. Current diabetic treatment includes diet.  Hyperlipidemia This is a chronic problem. Pertinent negatives include no chest pain, myalgias or shortness of breath. Current antihyperlipidemic treatment includes fibric acid derivatives (stop atorvastatin  several months ago).    Review of Systems  Constitutional:  Negative for fatigue and unexpected weight change.  HENT:  Negative for trouble swallowing.   Eyes:  Negative for visual disturbance.  Respiratory:  Negative for cough, chest tightness, shortness of breath and wheezing.   Cardiovascular:  Negative for chest pain, palpitations and leg swelling.  Gastrointestinal:  Negative for abdominal pain, constipation and diarrhea.  Musculoskeletal:  Negative for arthralgias and myalgias.  Skin:        lumps  Neurological:  Negative for dizziness, weakness, light-headedness and headaches.     Lab Results  Component Value Date   NA 137 06/09/2024   K 4.5 06/09/2024   CO2 21 06/09/2024   GLUCOSE 142 (H) 06/09/2024   BUN 18 06/09/2024   CREATININE 0.59 06/09/2024   CALCIUM  10.3 06/09/2024   EGFR 94 06/09/2024   GFRNONAA >60 10/17/2019   Lab Results  Component Value Date   CHOL 152 05/14/2023   HDL 38 (L) 05/14/2023   LDLCALC 73 05/14/2023   TRIG 254 (H)  05/14/2023   CHOLHDL 4.0 05/14/2023   Lab Results  Component Value Date   TSH 1.010 05/14/2023   Lab Results  Component Value Date   HGBA1C 6.5 (H) 06/09/2024   Lab Results  Component Value Date   WBC 7.6 10/17/2019   HGB 11.9 (L) 10/17/2019   HCT 37.5 10/17/2019   MCV 86.4 10/17/2019   PLT 410 (H) 10/17/2019   Lab Results  Component Value Date   ALT 23 05/14/2023   AST 26 05/14/2023   ALKPHOS 65 05/14/2023   BILITOT <0.2 05/14/2023   No results found for: MARIEN BOLLS, VD25OH   Patient Active Problem List   Diagnosis Date Noted   Diastasis recti 06/09/2024   Presence of heart assist device (HCC) 06/09/2024   Bilateral hand pain 09/16/2023   Bilateral wrist pain 09/16/2023   Thyroid  goiter 05/14/2023   Bilateral carotid artery stenosis 06/23/2021   Subclavian artery stenosis 06/23/2021   Moderate mitral valve stenosis 06/23/2021   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/2020   Red blood cell antibody positive 08/16/2020   Cardiac pacemaker in situ 08/12/2020   Bradycardia 06/17/2020   RBBB (right bundle branch block) 09/24/2019   Dermatitis 03/23/2019   Atherosclerosis of native arteries of extremity with intermittent claudication 11/12/2018   Obesity (BMI 30.0-34.9) 04/22/2018   Status post reverse total shoulder replacement, 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   Rotator cuff tendinitis, left 03/26/2018   Spondylosis of cervical region without myelopathy or radiculopathy 03/26/2018   PAD (peripheral artery disease) 05/16/2017   Essential hypertension 05/16/2017   Hyperlipidemia associated with  type 2 diabetes mellitus (HCC) 05/16/2017   Hx of cardiac cath 05/16/2015   Type II diabetes mellitus with complication (HCC) 03/25/2014   Coronary artery disease involving native coronary artery of native heart without angina pectoris 03/25/2014   S/P TAH-BSO 03/25/2014   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    Allergies[1]  Past Surgical History:   Procedure Laterality Date   ABDOMINAL HYSTERECTOMY  1989   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   APPLICATION OF WOUND VAC Bilateral 12/02/2018   Procedure: APPLICATION OF WOUND VAC;  Surgeon: Jama Cordella MATSU, MD;  Location: [facility] ORS;  Service: Vascular;  Laterality: Bilateral;   BREAST BIOPSY Left 2014   neg   BYPASS GRAFTING FEMORAL/ANTERIOR TIBIAL W/VEIN Right 11/07/2022   CARDIAC CATHETERIZATION     cornary stent   CESAREAN SECTION  1971, 1975   CHOLECYSTECTOMY  1987   COLONOSCOPY WITH PROPOFOL  N/A 09/22/2018   Procedure: COLONOSCOPY WITH PROPOFOL ;  Surgeon: Gaylyn Gladis PENNER, MD;  Location: [facility] ENDOSCOPY;  Service: Endoscopy;  Laterality: N/A;   COLONOSCOPY WITH PROPOFOL  N/A 12/15/2021   Procedure: COLONOSCOPY WITH PROPOFOL ;  Surgeon: Maryruth Ole DASEN, MD;  Location: [facility] ENDOSCOPY;  Service: Endoscopy;  Laterality: N/A;  DM, WHEELCHAIR   CORONARY ANGIOPLASTY  2008   stent x1   ENDARTERECTOMY FEMORAL Bilateral 11/12/2018   Procedure: ENDARTERECTOMY FEMORAL;  Surgeon: Jama Cordella MATSU, MD;  Location: [facility] ORS;  Service: Vascular;  Laterality: Bilateral;   INSERTION OF ILIAC STENT Bilateral 11/12/2018   Procedure: INSERTION OF ILIAC STENT;  Surgeon: Jama Cordella MATSU, MD;  Location: [facility] ORS;  Service: Vascular;  Laterality: Bilateral;   LOWER EXTREMITY ANGIOGRAPHY Left 08/05/2018   Procedure: LOWER EXTREMITY ANGIOGRAPHY;  Surgeon: Jama Cordella MATSU, MD;  Location: [facility] INVASIVE CV LAB;  Service: Cardiovascular;  Laterality: Left;   REVERSE SHOULDER ARTHROPLASTY Right 04/08/2018   Procedure: REVERSE SHOULDER ARTHROPLASTY;  Surgeon: Edie Norleen PARAS, MD;  Location: [facility] ORS;  Service: Orthopedics;  Laterality: Right;   TUBAL LIGATION     WOUND DEBRIDEMENT Bilateral 12/02/2018   Procedure: DEBRIDEMENT WOUND;  Surgeon: Jama Cordella MATSU, MD;  Location: [facility] ORS;  Service: Vascular;  Laterality: Bilateral;    Social History[2]   Medication list has been reviewed and  updated.  Active Medications[3]     10/13/2024    9:15 AM 03/30/2024    8:11 AM 09/04/2023    2:11 PM 05/14/2023    2:57 PM  GAD 7 : Generalized Anxiety Score  Nervous, Anxious, on Edge 0 0 0 0  Control/stop worrying 0 0 0 0  Worry too much - different things  0 0 0  Trouble relaxing  0 0 0  Restless  0 0 0  Easily annoyed or irritable  0 0 0  Afraid - awful might happen  0 0 0  Total GAD 7 Score  0 0 0  Anxiety Difficulty  Not difficult at all Not difficult at all Not difficult at all       10/13/2024    9:15 AM 06/10/2024    2:19 PM 03/30/2024    8:11 AM  Depression screen PHQ 2/9  Decreased Interest 0 0 0  Down, Depressed, Hopeless 0 0 0  PHQ - 2 Score 0 0 0  Altered sleeping  0 0  Tired, decreased energy  0 0  Change in appetite  0 0  Feeling bad or failure about yourself   0 0  Trouble concentrating  0 0  Moving slowly or fidgety/restless  0 0  Suicidal thoughts  0 0  PHQ-9 Score  0  0   Difficult doing work/chores  Not difficult at all Not difficult at all     Data saved with a previous flowsheet row definition    BP Readings from Last 3 Encounters:  10/13/24 116/74  06/09/24 122/78  04/24/24 136/64    Physical Exam Vitals and nursing note reviewed.  Constitutional:      General: She is not in acute distress.    Appearance: She is well-developed.  HENT:     Head: Normocephalic and atraumatic.     Right Ear: Tympanic membrane and ear canal normal.     Left Ear: Tympanic membrane and ear canal normal.     Nose:     Right Sinus: No maxillary sinus tenderness.     Left Sinus: No maxillary sinus tenderness.  Eyes:     General: No scleral icterus.       Right eye: No discharge.        Left eye: No discharge.     Conjunctiva/sclera: Conjunctivae normal.  Neck:     Thyroid : No thyromegaly.     Vascular: No carotid bruit.  Cardiovascular:     Rate and Rhythm: Normal rate and regular rhythm. No extrasystoles are present.    Pulses: Normal pulses.      Heart sounds: Murmur heard.     Systolic murmur is present with a grade of 2/6.  Pulmonary:     Effort: Pulmonary effort is normal. No respiratory distress.     Breath sounds: No decreased breath sounds or wheezing.  Abdominal:     General: Bowel sounds are normal.     Palpations: Abdomen is soft.     Tenderness: There is no abdominal tenderness.  Musculoskeletal:     Cervical back: Normal range of motion. No erythema.     Right lower leg: No edema.     Left lower leg: No edema.  Lymphadenopathy:     Cervical: No cervical adenopathy.  Skin:    General: Skin is warm and dry.     Findings: No rash.     Comments: Mobile soft non tender subcutaneous masses in both arms  Neurological:     Mental Status: She is alert and oriented to person, place, and time.     Cranial Nerves: No cranial nerve deficit.     Sensory: No sensory deficit.     Deep Tendon Reflexes: Reflexes are normal and symmetric.  Psychiatric:        Attention and Perception: Attention normal.        Mood and Affect: Mood normal.     Wt Readings from Last 3 Encounters:  06/10/24 140 lb (63.5 kg)  06/09/24 141 lb (64 kg)  04/24/24 146 lb (66.2 kg)    BP 116/74   Pulse 73   Ht 4' 11 (1.499 m)   SpO2 93%   BMI 28.28 kg/m   Assessment and Plan:  Problem List Items Addressed This Visit       Unprioritized   Type II diabetes mellitus with complication (HCC) (Chronic)   Currently medications are none.  No hypoglycemic episodes noted. Home blood sugars in the 130 range. Last visit medical regimen changes were to stop medications. Will get labs and advise.  Continue diet only for now. Lab Results  Component Value Date   HGBA1C 6.5 (H) 06/09/2024  She needs to schedule DM eye exam U/Cr ordered  Relevant Medications   atorvastatin  (LIPITOR) 20 MG tablet   Continuous Glucose Sensor (FREESTYLE LIBRE 3 PLUS SENSOR) MISC   Other Relevant Orders   Comprehensive metabolic panel with GFR   Hemoglobin  A1c   Microalbumin / creatinine urine ratio   Essential hypertension - Primary (Chronic)   Well controlled blood pressure today. Current regimen is spironolactone, Coreg and Lasix . No medication side effects noted. Renal function has been normal.        Relevant Medications   atorvastatin  (LIPITOR) 20 MG tablet   Other Relevant Orders   Comprehensive metabolic panel with GFR   TSH   Hyperlipidemia associated with type 2 diabetes mellitus (HCC) (Chronic)   LDL is  Lab Results  Component Value Date   LDLCALC 73 05/14/2023    Current medication regimen is fenofibrate .  She stopped Atorvastatin  due to concerns over side effects. Goal LDL is < 55.  Expect LDL to be much higher. Resume atorvastatin  80 mg daily.         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 Recent evaluation of right LE adequate. Needs to continue fenofibrate  and resume statin      Relevant Medications   atorvastatin  (LIPITOR) 20 MG tablet   Coronary artery disease involving native coronary artery of native heart without angina pectoris (Chronic)   Followed by Cardiology. Pacemaker in place. 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       No follow-ups on file.    Leita HILARIO Adie, MD [place] [facility] [place]           [1]  Allergies Allergen Reactions   Tape Other (See Comments)    Hypoallergenic tape - blisters Blisters   Silicone Itching    With long term contact  [2]  Social History Tobacco Use   Smoking status: Former    Current packs/day: 0.00    Average packs/day: 2.0 packs/day for 37.0  years (74.0 ttl pk-yrs)    Types: Cigarettes    Start date: 81    Quit date: 2008    Years since quitting: 18.0   Smokeless tobacco: Never  Vaping Use   Vaping status: Never Used  Substance Use Topics   Alcohol use: Yes    Comment: rare   Drug use: No  [3]  Current Meds  Medication Sig   acetaminophen  (TYLENOL ) 325 MG tablet Take 2 tablets (650 mg total) by mouth every 6 (six) hours as needed for mild pain or headache (fever >/= 101).   Calcium  Carbonate-Vitamin D (CALCIUM  600+D PO) Take 1 tablet by mouth 2 (two) times daily.   carvedilol (COREG) 3.125 MG tablet Take 3.125 mg by mouth 2 (two) times daily with a meal.   cholecalciferol  (VITAMIN D) 25 MCG (1000 UT) tablet Take 1 tablet (1,000 Units total) by mouth daily.   clopidogrel  (PLAVIX ) 75 MG tablet TAKE 1 TABLET BY MOUTH EVERY DAY   DULoxetine  (CYMBALTA ) 30 MG capsule TAKE 1 CAPSULE BY MOUTH EVERY DAY   fenofibrate  (TRICOR ) 145 MG tablet TAKE 1 TABLET BY MOUTH EVERY DAY   furosemide  (LASIX )  20 MG tablet Take 20 mg by mouth daily as needed for fluid.    gabapentin  (NEURONTIN ) 300 MG capsule Take 300 mg by mouth at bedtime. (Patient taking differently: Take 300 mg by mouth at bedtime. Taking PRN)   magnesium  oxide (MAG-OX) 400 MG tablet Take 400 mg by mouth daily.   Multiple Vitamin (MULTIVITAMIN WITH MINERALS) TABS tablet Take 1 tablet by mouth daily.   nystatin -triamcinolone  ointment (MYCOLOG) Apply 1 Application topically 2 (two) times daily.   senna-docusate (SENOKOT-S) 8.6-50 MG tablet Take 1 tablet by mouth at bedtime as needed for mild constipation.   spironolactone (ALDACTONE) 25 MG tablet Take 12.5 mg by mouth daily.   vitamin B-12 (CYANOCOBALAMIN ) 1000 MCG tablet Take 1,000 mcg by mouth daily.   vitamin E  400 UNIT capsule Take 400 Units by mouth daily.    zinc  sulfate 220 (50 Zn) MG capsule Take 1 capsule (220 mg total) by mouth daily.   [DISCONTINUED] Continuous Glucose Sensor (FREESTYLE LIBRE 3 PLUS SENSOR) MISC  PLACE 1 SENSOR ON THE SKIN EVERY 14 DAYS.   "

## 2024-10-13 NOTE — Assessment & Plan Note (Signed)
 Followed by Cardiology. Pacemaker in place. On Fenofibrate ; needs to resume statin

## 2024-10-13 NOTE — Assessment & Plan Note (Addendum)
 LDL is  Lab Results  Component Value Date   LDLCALC 73 05/14/2023    Current medication regimen is fenofibrate .  She stopped Atorvastatin  due to concerns over side effects. Goal LDL is < 55.  Expect LDL to be much higher. Resume atorvastatin  80 mg daily.

## 2024-10-13 NOTE — Assessment & Plan Note (Signed)
 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 Recent evaluation of right LE adequate. Needs to continue fenofibrate  and resume statin

## 2024-10-13 NOTE — Assessment & Plan Note (Addendum)
 Well controlled blood pressure today. Current regimen is spironolactone, Coreg and Lasix . No medication side effects noted. Renal function has been normal.

## 2024-10-13 NOTE — Assessment & Plan Note (Addendum)
 Currently medications are none.  No hypoglycemic episodes noted. Home blood sugars in the 130 range. Last visit medical regimen changes were to stop medications. Will get labs and advise.  Continue diet only for now. Lab Results  Component Value Date   HGBA1C 6.5 (H) 06/09/2024  She needs to schedule DM eye exam U/Cr ordered

## 2024-10-16 LAB — MICROALBUMIN / CREATININE URINE RATIO
Creatinine, Urine: 75.3 mg/dL
Microalb/Creat Ratio: 168 mg/g{creat} — ABNORMAL HIGH (ref 0–29)
Microalbumin, Urine: 126.5 ug/mL

## 2024-10-16 LAB — TSH: TSH: 1.56 u[IU]/mL (ref 0.450–4.500)

## 2024-10-16 LAB — HEMOGLOBIN A1C
Est. average glucose Bld gHb Est-mCnc: 137 mg/dL
Hgb A1c MFr Bld: 6.4 % — ABNORMAL HIGH (ref 4.8–5.6)

## 2024-10-16 LAB — LIPID PANEL
Chol/HDL Ratio: 5.3 ratio — ABNORMAL HIGH (ref 0.0–4.4)
Cholesterol, Total: 232 mg/dL — ABNORMAL HIGH (ref 100–199)
HDL: 44 mg/dL
LDL Chol Calc (NIH): 165 mg/dL — ABNORMAL HIGH (ref 0–99)
Triglycerides: 126 mg/dL (ref 0–149)
VLDL Cholesterol Cal: 23 mg/dL (ref 5–40)

## 2024-10-16 LAB — COMPREHENSIVE METABOLIC PANEL WITH GFR
ALT: 16 IU/L (ref 0–32)
AST: 19 IU/L (ref 0–40)
Albumin: 4.1 g/dL (ref 3.8–4.8)
Alkaline Phosphatase: 55 IU/L (ref 49–135)
BUN/Creatinine Ratio: 40 — ABNORMAL HIGH (ref 12–28)
BUN: 23 mg/dL (ref 8–27)
Bilirubin Total: 0.3 mg/dL (ref 0.0–1.2)
CO2: 21 mmol/L (ref 20–29)
Calcium: 9.3 mg/dL (ref 8.7–10.3)
Chloride: 103 mmol/L (ref 96–106)
Creatinine, Ser: 0.58 mg/dL (ref 0.57–1.00)
Globulin, Total: 2.3 g/dL (ref 1.5–4.5)
Glucose: 154 mg/dL — ABNORMAL HIGH (ref 70–99)
Potassium: 4.2 mmol/L (ref 3.5–5.2)
Sodium: 140 mmol/L (ref 134–144)
Total Protein: 6.4 g/dL (ref 6.0–8.5)
eGFR: 94 mL/min/1.73

## 2025-02-10 ENCOUNTER — Ambulatory Visit: Admitting: Student

## 2025-06-16 ENCOUNTER — Ambulatory Visit
